# Patient Record
Sex: Female | Born: 1960 | Race: Black or African American | Hispanic: No | Marital: Married | Smoking: Never smoker
Health system: Southern US, Community
[De-identification: ages and names within clinical notes are randomized; demographics above are authoritative.]

## PROBLEM LIST (undated history)

## (undated) DIAGNOSIS — R112 Nausea with vomiting, unspecified: Secondary | ICD-10-CM

## (undated) DIAGNOSIS — C801 Malignant (primary) neoplasm, unspecified: Secondary | ICD-10-CM

## (undated) DIAGNOSIS — IMO0001 Reserved for inherently not codable concepts without codable children: Secondary | ICD-10-CM

## (undated) DIAGNOSIS — R519 Headache, unspecified: Secondary | ICD-10-CM

## (undated) DIAGNOSIS — K219 Gastro-esophageal reflux disease without esophagitis: Secondary | ICD-10-CM

## (undated) DIAGNOSIS — T753XXA Motion sickness, initial encounter: Secondary | ICD-10-CM

## (undated) DIAGNOSIS — T8859XA Other complications of anesthesia, initial encounter: Secondary | ICD-10-CM

## (undated) DIAGNOSIS — R200 Anesthesia of skin: Secondary | ICD-10-CM

## (undated) DIAGNOSIS — Z90711 Acquired absence of uterus with remaining cervical stump: Secondary | ICD-10-CM

## (undated) DIAGNOSIS — E669 Obesity, unspecified: Secondary | ICD-10-CM

## (undated) DIAGNOSIS — I10 Essential (primary) hypertension: Secondary | ICD-10-CM

## (undated) DIAGNOSIS — R202 Paresthesia of skin: Secondary | ICD-10-CM

## (undated) DIAGNOSIS — R5383 Other fatigue: Secondary | ICD-10-CM

## (undated) DIAGNOSIS — Z9889 Other specified postprocedural states: Secondary | ICD-10-CM

## (undated) DIAGNOSIS — R6889 Other general symptoms and signs: Secondary | ICD-10-CM

## (undated) DIAGNOSIS — Z9289 Personal history of other medical treatment: Secondary | ICD-10-CM

## (undated) DIAGNOSIS — R51 Headache: Secondary | ICD-10-CM

## (undated) DIAGNOSIS — T4145XA Adverse effect of unspecified anesthetic, initial encounter: Secondary | ICD-10-CM

## (undated) HISTORY — DX: Anesthesia of skin: R20.0

## (undated) HISTORY — DX: Headache, unspecified: R51.9

## (undated) HISTORY — PX: FOOT SURGERY: SHX648

## (undated) HISTORY — PX: OTHER SURGICAL HISTORY: SHX169

## (undated) HISTORY — DX: Reserved for inherently not codable concepts without codable children: IMO0001

## (undated) HISTORY — DX: Other fatigue: R53.83

## (undated) HISTORY — DX: Gastro-esophageal reflux disease without esophagitis: K21.9

## (undated) HISTORY — PX: CARPAL TUNNEL RELEASE: SHX101

## (undated) HISTORY — DX: Anesthesia of skin: R20.2

## (undated) HISTORY — PX: UPPER GASTROINTESTINAL ENDOSCOPY: SHX188

## (undated) HISTORY — DX: Essential (primary) hypertension: I10

## (undated) HISTORY — DX: Headache: R51

## (undated) HISTORY — DX: Other general symptoms and signs: R68.89

## (undated) HISTORY — DX: Personal history of other medical treatment: Z92.89

## (undated) HISTORY — DX: Obesity, unspecified: E66.9

## (undated) HISTORY — DX: Acquired absence of uterus with remaining cervical stump: Z90.711

---
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
## 1999-09-30 ENCOUNTER — Encounter: Payer: Self-pay | Admitting: Family Medicine

## 1999-09-30 ENCOUNTER — Ambulatory Visit (HOSPITAL_COMMUNITY): Admission: RE | Admit: 1999-09-30 | Discharge: 1999-09-30 | Payer: Self-pay | Admitting: Family Medicine

## 2000-12-17 ENCOUNTER — Emergency Department (HOSPITAL_COMMUNITY): Admission: EM | Admit: 2000-12-17 | Discharge: 2000-12-18 | Payer: Self-pay | Admitting: Emergency Medicine

## 2000-12-18 ENCOUNTER — Encounter: Payer: Self-pay | Admitting: Emergency Medicine

## 2001-07-12 ENCOUNTER — Other Ambulatory Visit: Admission: RE | Admit: 2001-07-12 | Discharge: 2001-07-12 | Payer: Self-pay | Admitting: Internal Medicine

## 2005-05-29 ENCOUNTER — Emergency Department (HOSPITAL_COMMUNITY): Admission: EM | Admit: 2005-05-29 | Discharge: 2005-05-30 | Payer: Self-pay | Admitting: Emergency Medicine

## 2005-10-13 HISTORY — PX: ABDOMINAL HYSTERECTOMY: SHX81

## 2006-06-16 ENCOUNTER — Ambulatory Visit (HOSPITAL_COMMUNITY): Admission: RE | Admit: 2006-06-16 | Discharge: 2006-06-16 | Payer: Self-pay | Admitting: Obstetrics

## 2006-06-25 ENCOUNTER — Encounter (INDEPENDENT_AMBULATORY_CARE_PROVIDER_SITE_OTHER): Payer: Self-pay | Admitting: Specialist

## 2006-06-25 ENCOUNTER — Inpatient Hospital Stay (HOSPITAL_COMMUNITY): Admission: RE | Admit: 2006-06-25 | Discharge: 2006-06-28 | Payer: Self-pay | Admitting: Obstetrics

## 2006-07-07 ENCOUNTER — Inpatient Hospital Stay (HOSPITAL_COMMUNITY): Admission: AD | Admit: 2006-07-07 | Discharge: 2006-07-07 | Payer: Self-pay | Admitting: Obstetrics

## 2007-08-17 ENCOUNTER — Inpatient Hospital Stay (HOSPITAL_COMMUNITY): Admission: AD | Admit: 2007-08-17 | Discharge: 2007-08-17 | Payer: Self-pay | Admitting: Obstetrics

## 2007-10-20 ENCOUNTER — Encounter: Admission: RE | Admit: 2007-10-20 | Discharge: 2007-12-01 | Payer: Self-pay | Admitting: Internal Medicine

## 2008-07-19 ENCOUNTER — Ambulatory Visit (HOSPITAL_COMMUNITY): Admission: RE | Admit: 2008-07-19 | Discharge: 2008-07-19 | Payer: Self-pay | Admitting: Obstetrics

## 2008-07-31 ENCOUNTER — Ambulatory Visit: Payer: Self-pay | Admitting: Internal Medicine

## 2008-07-31 DIAGNOSIS — I10 Essential (primary) hypertension: Secondary | ICD-10-CM | POA: Insufficient documentation

## 2008-07-31 DIAGNOSIS — R109 Unspecified abdominal pain: Secondary | ICD-10-CM | POA: Insufficient documentation

## 2008-07-31 LAB — CONVERTED CEMR LAB
ALT: 16 U/L
AST: 18 U/L
Albumin: 3.6 g/dL
Alkaline Phosphatase: 67 U/L
Amylase: 89 U/L
Basophils Absolute: 0 10*3/uL
Basophils Relative: 0.6 %
Bilirubin, Direct: 0.1 mg/dL
Eosinophils Absolute: 0.1 10*3/uL
Eosinophils Relative: 1.8 %
HCT: 37.7 %
Hemoglobin: 13.1 g/dL
Lymphocytes Relative: 34.8 %
MCHC: 34.7 g/dL
MCV: 83.4 fL
Monocytes Absolute: 0.3 10*3/uL
Monocytes Relative: 5.2 %
Neutro Abs: 3.3 10*3/uL
Neutrophils Relative %: 57.6 %
Platelets: 170 10*3/uL
RBC: 4.52 M/uL
RDW: 11.9 %
Total Bilirubin: 0.6 mg/dL
Total Protein: 7.4 g/dL
WBC: 5.7 10*3/uL

## 2008-08-02 ENCOUNTER — Ambulatory Visit (HOSPITAL_COMMUNITY): Admission: RE | Admit: 2008-08-02 | Discharge: 2008-08-02 | Payer: Self-pay | Admitting: Internal Medicine

## 2008-08-04 ENCOUNTER — Ambulatory Visit: Payer: Self-pay | Admitting: Internal Medicine

## 2008-08-15 ENCOUNTER — Ambulatory Visit: Payer: Self-pay | Admitting: Internal Medicine

## 2008-08-15 ENCOUNTER — Encounter: Payer: Self-pay | Admitting: Internal Medicine

## 2008-08-19 ENCOUNTER — Encounter: Payer: Self-pay | Admitting: Internal Medicine

## 2009-03-13 DIAGNOSIS — Z9289 Personal history of other medical treatment: Secondary | ICD-10-CM

## 2009-03-13 DIAGNOSIS — IMO0001 Reserved for inherently not codable concepts without codable children: Secondary | ICD-10-CM

## 2009-03-13 HISTORY — DX: Personal history of other medical treatment: Z92.89

## 2009-03-13 HISTORY — DX: Reserved for inherently not codable concepts without codable children: IMO0001

## 2009-03-16 ENCOUNTER — Encounter: Payer: Self-pay | Admitting: Cardiology

## 2009-03-22 ENCOUNTER — Ambulatory Visit: Payer: Self-pay | Admitting: Cardiology

## 2009-03-22 ENCOUNTER — Encounter: Payer: Self-pay | Admitting: Cardiology

## 2009-03-22 DIAGNOSIS — R072 Precordial pain: Secondary | ICD-10-CM | POA: Insufficient documentation

## 2009-03-22 DIAGNOSIS — I509 Heart failure, unspecified: Secondary | ICD-10-CM | POA: Insufficient documentation

## 2009-04-03 ENCOUNTER — Telehealth (INDEPENDENT_AMBULATORY_CARE_PROVIDER_SITE_OTHER): Payer: Self-pay

## 2009-04-04 ENCOUNTER — Ambulatory Visit: Payer: Self-pay

## 2009-04-04 ENCOUNTER — Ambulatory Visit: Payer: Self-pay | Admitting: Cardiology

## 2009-04-05 ENCOUNTER — Encounter: Payer: Self-pay | Admitting: Cardiology

## 2009-04-05 ENCOUNTER — Ambulatory Visit: Payer: Self-pay

## 2009-04-06 ENCOUNTER — Ambulatory Visit: Payer: Self-pay | Admitting: Cardiology

## 2009-04-06 ENCOUNTER — Encounter: Payer: Self-pay | Admitting: Cardiology

## 2009-05-16 ENCOUNTER — Encounter (INDEPENDENT_AMBULATORY_CARE_PROVIDER_SITE_OTHER): Payer: Self-pay | Admitting: *Deleted

## 2010-10-25 ENCOUNTER — Emergency Department: Payer: Self-pay | Admitting: Emergency Medicine

## 2010-11-10 LAB — CONVERTED CEMR LAB
CO2: 28 meq/L (ref 19–32)
Calcium: 9 mg/dL (ref 8.4–10.5)
Chloride: 106 meq/L (ref 96–112)
Creatinine, Ser: 0.9 mg/dL (ref 0.4–1.2)
GFR calc non Af Amer: 85.84 mL/min (ref >60)
Potassium: 3.7 meq/L (ref 3.5–5.1)

## 2011-02-28 NOTE — Discharge Summary (Signed)
NAME:  [name], [name]                ACCOUNT NO.:  [id number]   MEDICAL RECORD NO.:  [id number]          PATIENT TYPE:  INP   LOCATION:  9310                          FACILITY:  [facility]   PHYSICIAN:  Roseanna Rainbow, M.D.DATE OF BIRTH:  04/28/1961   DATE OF ADMISSION:  06/25/2006  DATE OF DISCHARGE:  06/28/2006                                 DISCHARGE SUMMARY   CHIEF COMPLAINT:  The patient is a 50 year old with symptomatic uterine  fibroids who presents for total abdominal hysterectomy.   HISTORY OF PRESENT ILLNESS:  She reports lower abdominal cramping.  A CT  scan was consistent with large fibroid uterus.   ALLERGIES:  No known drug allergies.   MEDICATIONS:  Norvasc.   PAST SURGICAL HISTORY:  Tubal ligation and oral surgery.   PAST MEDICAL HISTORY:  Hypertension.   FAMILY HISTORY:  Noncontributory   PHYSICAL EXAMINATION:  VITAL SIGNS: Temperature 97.8, pulse 83, respiratory  rate 18, blood pressure 143/93.  GENERAL APPEARANCE:  Well-developed, well-nourished, African-American female  in no apparent distress.  HEAD, EYES, EARS, NOSE AND THROAT:  Normocephalic, atraumatic.  NECK:  Supple.  LUNGS:  Clear to auscultation.  HEART: Regular rate and rhythm.  BREASTS: No masses, tenderness, or discharge.  ABDOMEN:  Obese.  Mass arising from the pelvis to approximately 4 cm below  the umbilicus, tender.  Adenopathy none.  PELVIC:  Normal external female genitalia.  Vagina clean.  Cervix without  lesions.  BIMANUAL:  Uterus approximately 16 weeks size, tender.  Adnexa nonpalpable.  EXTREMITIES:  No clubbing, cyanosis or edema.  SKIN: No rash.   ASSESSMENT:  Symptomatic uterine fibroid.   PLAN:  The planned procedure is a total abdominal hysterectomy.   HOSPITAL COURSE:  The patient was admitted and underwent a total abdominal  hysterectomy.  Please see the dictated operative summary.  On postoperative  day #1, her hemoglobin was 10.  The remainder of her hospital course  was  uneventful.  She was discharged to home on postoperative day #3 tolerating a  regular diet.   DISCHARGE DIAGNOSIS:  Uterine fibroids.   PROCEDURE:  Total abdominal hysterectomy.   CONDITION:  Good.   DIET:  Regular.   ACTIVITY:  Progressive activity, pelvic rest.   MEDICATIONS:  Resume home medications.  Percocet.   DISPOSITION:  The patient was to followup in the office in 1-2 weeks.      Roseanna Rainbow, M.D.  Electronically Signed     LAJ/MEDQ  D:  07/19/2006  T:  07/20/2006  Job:  [id number]

## 2011-02-28 NOTE — Op Note (Signed)
NAME:  [name], PALERMO                ACCOUNT NO.:  [id number]   MEDICAL RECORD NO.:  [id number]          PATIENT TYPE:  INP   LOCATION:  9310                          FACILITY:  [facility]   PHYSICIAN:  Charles A. Clearance Coots, M.D.DATE OF BIRTH:  Feb 19, 1961   DATE OF PROCEDURE:  06/25/2006  DATE OF DISCHARGE:                                 OPERATIVE REPORT   PREOPERATIVE DIAGNOSIS:  Symptomatic uterine fibroids.   POSTOPERATIVE DIAGNOSIS:  Symptomatic uterine fibroids.   PROCEDURE:  Total abdominal hysterectomy.   SURGEON:  Dr. Coral Ceo.   ASSISTANT:  Dr. Antionette Char.   ANESTHESIA:  General.   ESTIMATED BLOOD LOSS:  400 mL.   IV FLUIDS:  2300 mL.   URINE OUTPUT:  200 mL clear.   COMPLICATIONS:  None.   Foley to gravity.   SPECIMEN:  Uterus with cervix and fibroids   FINDINGS:  Multiple uterine fibroids.   DESCRIPTION OF PROCEDURE:  The patient was brought to the operating room and  after satisfactory general endotracheal anesthesia, the abdomen was prepped  and draped in the usual sterile fashion and an indwelling Foley catheter was  placed.  A Pfannenstiel skin incision was made with a scalpel that was deep  and down to the fascia with the scalpel. The fascia was nicked in the  midline and the fascial incision was extended to the left and to right with  curved Mayo scissors.  The superior and inferior fascial edges were taken  off of the rectus muscles with both blunt and sharp dissection.  The rectus  muscle was divided bluntly in the midline and the peritoneum was entered  digitally and was extended superiorly and inferiorly sharply with Mayo  scissors being careful to avoid the urinary bladder inferiorly. The Lenox Ahr retractor was placed and the bowel was packed off with moist lap  sponges. The bladder blade and the posterior blade was then placed and the  Kelly forceps were placed across the cornu of the uterus across the utero-  ovarian and broad  ligament.  The round ligaments were grasped with Kelly  forceps and were transected with electrocautery bilaterally and the incision  was extended anteriorly and posteriorly being carried anteriorly around the  vesicouterine fold of peritoneum and the urinary bladder was pushed down and  away from the lower uterine segment and cervix, placing it well out of the  operative field.  The peritoneum was then taken posteriorly towards the  uterosacral ligaments. The broad ligament was tented digitally in the medial  aspect to create an opening and the Kelly forceps that were placed on the  cornu were advanced down within this opening and parametrial clamp was  placed below the Kelly forceps and advanced into the opening that was  created in the broad ligament.  The utero-ovarian ligament and broad  ligament was then transected between the clamps and a free tie of #0 Vicryl  was placed beneath the parametrial clamp and a transfixion suture of #0  Vicryl was placed above the knot.  The same procedure was performed on the  opposite side  without complications.  The uterine vessels were then  skeletonized bilaterally and crossclamped with a parametrial clamp and a  straight parametrial clamp was placed above the parametrial clamp to prevent  backflow.  The uterine vessels were transected and suture ligated with a  transfixion suture of zero Vicryl bilaterally. Because of the large bulky  uterus, the uterine fundus was then transected above the knot where the  uterine vessels were ligated and the fundus was submitted to pathology for  evaluation. The cervical stump was then grasped with Kocher forceps  anteriorly and posteriorly and the procedure then continued with  crossclamping of the cardinal ligaments with straight parametrial clamps  transection and suture ligation with transfixion sutures of #0 Vicryl down  to the uterosacral ligaments bilaterally. The uterosacral ligaments were  then  crossclamped with curved parametrial clamps, transected and suture  ligated with transfixion sutures of #0 Vicryl bilaterally. Curved  parametrial clamps were then placed across the vaginal cuff on each side and  meeting in the center of the vaginal cuff. The cervix was then transected  submitted to pathology for evaluation. Each corner of the vaginal cuff was  suture ligated with transfixion sutures of #0 Vicryl and the center of the  vaginal cuff was suture ligated with figure-of-eight sutures of #0 Vicryl.  Hemostasis was excellent. The pelvic cavity was thoroughly irrigated with  warm saline solution and all clots were removed. All pedicles were observed  for hemostasis and there was no active bleeding noted. All instruments were  then retired and the O'Connor-O'Sullivan retractor was removed.  All packing  was removed. The surgical technician indicated that all sponge and  instrument counts were correct but the needle counts were not correct and a  x-ray of the abdomen was therefore ordered to be done after closure. The  abdomen was then closed as follows:  The peritoneum was closed with a  continuous suture of 2-0 Vicryl.  The fascia was closed with a continuous  suture of #0 PDS from each corner to the center.  The subcutaneous tissue  was thoroughly irrigated with warm saline solution.  All areas of  subcutaneous bleeding were coagulated with the Bovie. The skin was then  closed with a continuous subcuticular suture of 3-0 Monocryl.  A sterile  bandage was applied to the incision closure. X-ray of the abdomen was done  and revealed no evidence of retained needle in the abdomen. The needle count  actually was correct as it represented the number packs of needles that were  opened and the number of needles that were returned. The patient tolerated  the procedure well.  She was transported to the recovery room in  satisfactory condition.     Charles A. Clearance Coots, M.D.  Electronically  Signed     CAH/MEDQ  D:  06/25/2006  T:  06/26/2006  Job:  [id number]

## 2011-03-31 ENCOUNTER — Encounter: Payer: Self-pay | Admitting: Cardiovascular Disease

## 2011-04-15 ENCOUNTER — Other Ambulatory Visit: Payer: Self-pay | Admitting: Obstetrics & Gynecology

## 2011-04-15 DIAGNOSIS — Z1211 Encounter for screening for malignant neoplasm of colon: Secondary | ICD-10-CM

## 2011-05-06 ENCOUNTER — Other Ambulatory Visit: Payer: Self-pay

## 2011-07-22 LAB — URINE MICROSCOPIC-ADD ON

## 2011-12-14 ENCOUNTER — Other Ambulatory Visit: Payer: Self-pay | Admitting: Obstetrics

## 2012-09-08 ENCOUNTER — Encounter (HOSPITAL_COMMUNITY): Payer: Self-pay | Admitting: *Deleted

## 2012-09-08 ENCOUNTER — Emergency Department (HOSPITAL_COMMUNITY)
Admission: EM | Admit: 2012-09-08 | Discharge: 2012-09-09 | Disposition: A | Discharge status: Home / Self Care | Payer: BC Managed Care – PPO | Attending: Emergency Medicine | Admitting: Emergency Medicine

## 2012-09-08 DIAGNOSIS — R51 Headache: Secondary | ICD-10-CM | POA: Insufficient documentation

## 2012-09-08 DIAGNOSIS — R42 Dizziness and giddiness: Secondary | ICD-10-CM | POA: Insufficient documentation

## 2012-09-08 DIAGNOSIS — Z8719 Personal history of other diseases of the digestive system: Secondary | ICD-10-CM | POA: Insufficient documentation

## 2012-09-08 DIAGNOSIS — E669 Obesity, unspecified: Secondary | ICD-10-CM | POA: Insufficient documentation

## 2012-09-08 DIAGNOSIS — R110 Nausea: Secondary | ICD-10-CM | POA: Insufficient documentation

## 2012-09-08 MED ORDER — DIAZEPAM 5 MG/ML IJ SOLN
5.0000 mg | Freq: Once | INTRAMUSCULAR | Status: AC
  Administered 2012-09-08: 5 mg via INTRAVENOUS
  Filled 2012-09-08: qty 2

## 2012-09-08 MED ORDER — MECLIZINE HCL 25 MG PO TABS
25.0000 mg | ORAL_TABLET | Freq: Once | ORAL | Status: AC
  Administered 2012-09-08: 25 mg via ORAL
  Filled 2012-09-08: qty 1

## 2012-09-08 MED ORDER — ONDANSETRON HCL 4 MG/2ML IJ SOLN
4.0000 mg | Freq: Once | INTRAMUSCULAR | Status: AC
  Administered 2012-09-08: 4 mg via INTRAVENOUS
  Filled 2012-09-08: qty 2

## 2012-09-08 MED ORDER — DIAZEPAM 5 MG/ML IJ SOLN
2.5000 mg | Freq: Once | INTRAMUSCULAR | Status: AC
  Administered 2012-09-08: 2.5 mg via INTRAVENOUS
  Filled 2012-09-08: qty 2

## 2012-09-08 NOTE — ED Provider Notes (Signed)
History     CSN: [id number]  Arrival date & time 09/08/12  2151   First MD Initiated Contact with Patient 09/08/12 2200      Chief Complaint  Patient presents with  . Hypertension    (Consider location/radiation/quality/duration/timing/severity/associated sxs/prior treatment) Patient is a 51 y.o. female presenting with neurologic complaint. The history is provided by the patient.  Neurologic Problem The primary symptoms include headaches (mild), dizziness and nausea. Primary symptoms do not include fever or vomiting. The symptoms began less than 1 hour ago. The symptoms are unchanged. The neurological symptoms are diffuse. Context: while driving.  The headache is not associated with neck stiffness or weakness.  Dizziness also occurs with nausea. Dizziness does not occur with vomiting or weakness.  Additional symptoms do not include neck stiffness or weakness. Workup history does not include MRI or CT scan.    Past Medical History  Diagnosis Date  . HTN (hypertension)   . Obesity   . S/P partial hysterectomy   . GERD (gastroesophageal reflux disease)     w nonobstructing esophageal stricture  . Normal echocardiogram 6/10    EF 60-65% moderate diastolic dysfunction, mild LAE  . History of ETT 6/10    myoview EF 65%, normal wall motion, no ischemia or infarction, poor exercise capacity    Past Surgical History  Procedure Date  . Hysterectomy (other)     Family History  Problem Relation Age of Onset  . Heart attack Mother 64  . Heart attack Maternal Grandmother     age 20 or 42    History  Substance Use Topics  . Smoking status: Never Smoker   . Smokeless tobacco: Not on file  . Alcohol Use: No    OB History    Grav Para Term Preterm Abortions TAB SAB Ect Mult Living                  Review of Systems  Constitutional: Negative for fever and chills.  HENT: Negative for neck stiffness.   Respiratory: Negative for cough and shortness of breath.     Gastrointestinal: Positive for nausea. Negative for vomiting and abdominal pain.  Neurological: Positive for dizziness and headaches (mild). Negative for weakness.  All other systems reviewed and are negative.    Allergies  Review of patient's allergies indicates no known allergies.  Home Medications   Current Outpatient Rx  Name  Route  Sig  Dispense  Refill  . ASPIRIN 81 MG PO TBEC   Oral   Take 81 mg by mouth daily.           Marland Kitchen VITAMIN D PO   Oral   Take 1 tablet by mouth daily.         Marland Kitchen LISINOPRIL-HYDROCHLOROTHIAZIDE 20-12.5 MG PO TABS   Oral   Take 2 tablets by mouth daily.             BP 142/86  Pulse 80  Resp 18  SpO2 97%  Physical Exam  Nursing note and vitals reviewed. Constitutional: She is oriented to person, place, and time. She appears well-developed and well-nourished. No distress.  HENT:  Head: Normocephalic and atraumatic.  Eyes: EOM are normal. Pupils are equal, round, and reactive to light.  Neck: Normal range of motion. Neck supple.  Cardiovascular: Normal rate and regular rhythm.  Exam reveals no friction rub.   No murmur heard. Pulmonary/Chest: Effort normal and breath sounds normal. No respiratory distress. She has no wheezes. She has no rales.  Abdominal: Soft. She exhibits no distension. There is no tenderness. There is no rebound.  Musculoskeletal: Normal range of motion. She exhibits no edema.  Neurological: She is alert and oriented to person, place, and time.  Skin: She is not diaphoretic.    ED Course  Procedures (including critical care time)  Labs Reviewed - No data to display Ct Head Wo Contrast  09/09/2012  *RADIOLOGY REPORT*  Clinical Data: Headache.  Dizziness.  CT HEAD WITHOUT CONTRAST  Technique:  Contiguous axial images were obtained from the base of the skull through the vertex without contrast.  Comparison: No priors.  Findings: There is mild decreased attenuation in the periventricular white matter of the cerebral  hemispheres bilaterally, suggestive of very mild chronic microvascular ischemic disease.  No acute intracranial abnormalities.  Specifically, no signs of acute intracranial hemorrhage, definite area of acute/subacute cerebral ischemia, mass, mass effect, hydrocephalus or abnormal intra or extra-axial fluid collections.  Visualized paranasal sinuses and mastoids are well pneumatized.  No acute displaced skull fractures are identified.  IMPRESSION: 1.  No acute intracranial abnormalities. 2.  Findings suggestive of very mild chronic microvascular ischemic changes in the periventricular white matter, as above.  The appearance of the brain is otherwise normal.   Original Report Authenticated By: Trudie Reed, M.D.      1. Vertigo       MDM   A 51 year old female presents with sudden onset dizziness. Began while she was driving. Dizziness described as a spinning sensation with associated nausea. Initially hypertensive with EMS, however on arrival here initial pressure in the 130s.  Patient continually describing a sensation of spinning in the room. A Dix-Hallpike was grossly positive for serious exacerbation of her symptoms. Patient also experienced severe symptoms during head impulse testing. Due to her sudden onset of symptoms and severe nature, I do feel that she is experiencing peripheral vertigo. I do not feel she has central vertigo. She had no nystagmus present on exam, no fatigable nystagmus, and no field deficits. The remainder of her neurologic exam was normal. I did not walk her due to her severe symptoms. Patient given meclizine, Benadryl, Valium. Patient also had a CT of her head since she's never had these symptoms before and has history of hypertension.  CT Head normal. States mild improvement with the medicine, however still having the dizziness. Will give the patient some time to rest to see if she can ambulate.     Elwin Mocha, MD 09/09/12 [phone]

## 2012-09-08 NOTE — ED Notes (Signed)
Pt in from home via [facility], pt c/o dizziness onset while driving to picking up HTN meds, pt denies CP, SOB, V/D, blurred vision, pt c/o nausea, pt A&O x4, follows commands, speaks in complete sentences, NAD

## 2012-09-09 ENCOUNTER — Emergency Department (HOSPITAL_COMMUNITY): Payer: BC Managed Care – PPO

## 2012-09-09 ENCOUNTER — Encounter (HOSPITAL_COMMUNITY): Payer: Self-pay | Admitting: Radiology

## 2012-09-09 MED ORDER — ONDANSETRON HCL 8 MG PO TABS: 8.0000 mg | ORAL_TABLET | Freq: Three times a day (TID) | ORAL | Status: DC | PRN

## 2012-09-09 MED ORDER — MECLIZINE HCL 25 MG PO TABS: 25.0000 mg | ORAL_TABLET | Freq: Three times a day (TID) | ORAL | Status: DC | PRN

## 2012-09-09 NOTE — ED Provider Notes (Signed)
I saw and evaluated the patient, reviewed the resident's note and I agree with the findings and plan.  Sudden onset of vertigo while driving with nausea.  CN 2-12 intact, no ataxia on finger to nose, 5/5 strength throughout. No nystagmus.  Test of skew negative. Head impulse testing negative. Suspect peripheral vertigo given severity of symptoms and sudden onset.    Glynn Octave, MD 09/09/12 [phone]

## 2012-09-09 NOTE — ED Notes (Signed)
Patient back from CT; currently sitting up un bed; no respiratory or acute distress noted.  Patient updated on plan of care; informed patient that we are currently waiting on further orders/disposition from EDP.  Patient denies any other needs at this time; will continue to monitor.

## 2013-01-30 ENCOUNTER — Emergency Department: Payer: Self-pay | Admitting: Emergency Medicine

## 2013-08-02 ENCOUNTER — Encounter: Payer: Self-pay | Admitting: Obstetrics

## 2013-12-12 ENCOUNTER — Other Ambulatory Visit: Payer: Self-pay | Admitting: Obstetrics

## 2013-12-14 ENCOUNTER — Encounter: Payer: Self-pay | Admitting: Obstetrics

## 2014-02-17 ENCOUNTER — Ambulatory Visit (INDEPENDENT_AMBULATORY_CARE_PROVIDER_SITE_OTHER): Payer: BC Managed Care – PPO | Admitting: Podiatry

## 2014-02-17 ENCOUNTER — Encounter: Payer: Self-pay | Admitting: Podiatry

## 2014-02-17 ENCOUNTER — Telehealth: Payer: Self-pay | Admitting: *Deleted

## 2014-02-17 VITALS — BP 129/89 | HR 83 | Resp 16

## 2014-02-17 DIAGNOSIS — L03039 Cellulitis of unspecified toe: Secondary | ICD-10-CM

## 2014-02-17 NOTE — Telephone Encounter (Signed)
I have a toe that is sore and bleeding.  I know he's not in today.  I don't know what to do, whether to see somebody there or go to emergency room.  Please call.  I returned her call.  She thanked me for returning her call.  She stated she made an appointment in the [place] office with Dr. Paulla Dolly for today.  She stated that she wanted to report that whomever she spoke to that answers the phone was very abrupt.  She said she was very offended and thinks that person needs to be talked to about their phone etiquette.  It was very unprofessional.  I informed her I would let the office manager know.

## 2014-02-17 NOTE — Patient Instructions (Signed)

## 2014-02-19 NOTE — Progress Notes (Signed)
Subjective:     Patient ID: [name], female   DOB: 10-May-1961, 53 y.o.   MRN: [id number]  HPI patient presents with a damage left second nail to trauma that just occurred over the last few days and she can not wear shoe comfortable   Review of Systems     Objective:   Physical Exam Neurovascular status intact with patient well oriented x3 and found to have a damage second nail left which is loose and difficult for her to work with    Assessment:     Paronychia left second digit and nail secondary to trauma with distention of tissue plantar to the nail bed    Plan:     H&P performed and infiltrated 60 mg like Marcaine mixture to the left second toe. Removed the nail proud flesh abscess tissue which I found to be localized with no acute drainage noted and applied sterile dressing. Reappoint to recheck and begin soaks

## 2014-02-21 ENCOUNTER — Ambulatory Visit (INDEPENDENT_AMBULATORY_CARE_PROVIDER_SITE_OTHER): Payer: BC Managed Care – PPO | Admitting: Obstetrics

## 2014-02-21 ENCOUNTER — Encounter: Payer: Self-pay | Admitting: Obstetrics

## 2014-02-21 DIAGNOSIS — N393 Stress incontinence (female) (male): Secondary | ICD-10-CM | POA: Insufficient documentation

## 2014-02-21 DIAGNOSIS — N951 Menopausal and female climacteric states: Secondary | ICD-10-CM

## 2014-02-21 DIAGNOSIS — Z01419 Encounter for gynecological examination (general) (routine) without abnormal findings: Secondary | ICD-10-CM

## 2014-02-21 DIAGNOSIS — L0293 Carbuncle, unspecified: Secondary | ICD-10-CM | POA: Insufficient documentation

## 2014-02-21 DIAGNOSIS — M549 Dorsalgia, unspecified: Secondary | ICD-10-CM | POA: Insufficient documentation

## 2014-02-21 LAB — CBC WITH DIFFERENTIAL/PLATELET
Basophils Absolute: 0 10*3/uL (ref 0.0–0.1)
Basophils Relative: 0 % (ref 0–1)
Eosinophils Absolute: 0.1 10*3/uL (ref 0.0–0.7)
Eosinophils Relative: 2 % (ref 0–5)
HEMATOCRIT: 37 % (ref 36.0–46.0)
Hemoglobin: 12.2 g/dL (ref 12.0–15.0)
LYMPHS PCT: 36 % (ref 12–46)
Lymphs Abs: 2.2 10*3/uL (ref 0.7–4.0)
MCH: 27.3 pg (ref 26.0–34.0)
MCHC: 33 g/dL (ref 30.0–36.0)
MCV: 82.8 fL (ref 78.0–100.0)
MONO ABS: 0.2 10*3/uL (ref 0.1–1.0)
MONOS PCT: 4 % (ref 3–12)
NEUTROS ABS: 3.6 10*3/uL (ref 1.7–7.7)
Neutrophils Relative %: 58 % (ref 43–77)
Platelets: 193 10*3/uL (ref 150–400)
RBC: 4.47 MIL/uL (ref 3.87–5.11)
RDW: 13.6 % (ref 11.5–15.5)
WBC: 6.2 10*3/uL (ref 4.0–10.5)

## 2014-02-21 LAB — COMPREHENSIVE METABOLIC PANEL
ALT: 16 U/L (ref 0–35)
AST: 21 U/L (ref 0–37)
Albumin: 4.2 g/dL (ref 3.5–5.2)
Alkaline Phosphatase: 67 U/L (ref 39–117)
BUN: 17 mg/dL (ref 6–23)
CALCIUM: 9.7 mg/dL (ref 8.4–10.5)
CHLORIDE: 105 meq/L (ref 96–112)
CO2: 25 mEq/L (ref 19–32)
Creat: 0.84 mg/dL (ref 0.50–1.10)
GLUCOSE: 78 mg/dL (ref 70–99)
POTASSIUM: 4.5 meq/L (ref 3.5–5.3)
Sodium: 141 mEq/L (ref 135–145)
Total Bilirubin: 0.4 mg/dL (ref 0.2–1.2)
Total Protein: 6.7 g/dL (ref 6.0–8.3)

## 2014-02-21 MED ORDER — CYCLOBENZAPRINE HCL 10 MG PO TABS: 10.0000 mg | ORAL_TABLET | Freq: Three times a day (TID) | ORAL | Status: DC | PRN

## 2014-02-21 MED ORDER — DOXYCYCLINE HYCLATE 100 MG PO CAPS: 100.0000 mg | ORAL_CAPSULE | Freq: Two times a day (BID) | ORAL | Status: DC

## 2014-02-21 MED ORDER — METHOCARBAMOL 500 MG PO TABS
500.0000 mg | ORAL_TABLET | Freq: Three times a day (TID) | ORAL | Status: DC | PRN
Start: 2014-02-21 — End: 2015-01-12

## 2014-02-21 NOTE — Progress Notes (Signed)
Subjective:     [name] is a 53 y.o. female here for a routine exam.  Current complaints: Leaking urine.  Backache..    Personal health questionnaire:  Is patient [name], have a family history of breast and/or ovarian cancer: no Is there a family history of uterine cancer diagnosed at age < 39, gastrointestinal cancer, urinary tract cancer, family member who is a Field seismologist syndrome-associated carrier: no Is the patient overweight and hypertensive, family history of diabetes, personal history of gestational diabetes or PCOS: no Is patient over 89, have PCOS,  family history of premature CHD under age 33, diabetes, smoke, have hypertension or peripheral artery disease:  no  The HPI was reviewed and explored in further detail by the provider. Gynecologic History No LMP recorded. Patient has had a hysterectomy. Contraception: status post hysterectomy Last Pap: 2013. Results were: normal Last mammogram: 2014. Results were: normal  Obstetric History OB History  Gravida Para Term Preterm AB SAB TAB Ectopic Multiple Living  2 2 2       2     # Outcome Date GA Lbr Len/2nd Weight Sex Delivery Anes PTL Lv  2 TRM 04/24/85        Y  1 TRM 12/08/82        Y      Past Medical History  Diagnosis Date  . HTN (hypertension)   . Obesity   . S/P partial hysterectomy   . GERD (gastroesophageal reflux disease)     w nonobstructing esophageal stricture  . Normal echocardiogram 6/10    EF 70-62% moderate diastolic dysfunction, mild LAE  . History of ETT 6/10    myoview EF 65%, normal wall motion, no ischemia or infarction, poor exercise capacity    Past Surgical History  Procedure Laterality Date  . Hysterectomy (other)    . Foot surgery      Current outpatient prescriptions:aspirin (ASPIR-81) 81 MG EC tablet, Take 81 mg by mouth daily.  , Disp: , Rfl: ;  carvedilol (COREG) 6.25 MG tablet, TAKE 1 TABLET BY MOUTH TWICE DAILY, Disp: 180 tablet, Rfl: 2;  lisinopril-hydrochlorothiazide  (PRINZIDE,ZESTORETIC) 20-12.5 MG per tablet, TAKE 2 TABLETS BY MOUTH DAILY, Disp: 180 tablet, Rfl: 2;  Cholecalciferol (VITAMIN D PO), Take 1 tablet by mouth daily., Disp: , Rfl:  cyclobenzaprine (FLEXERIL) 10 MG tablet, Take 1 tablet (10 mg total) by mouth every 8 (eight) hours as needed for muscle spasms., Disp: 30 tablet, Rfl: 1;  doxycycline (VIBRAMYCIN) 100 MG capsule, Take 1 capsule (100 mg total) by mouth 2 (two) times daily., Disp: 14 capsule, Rfl: 2;  methocarbamol (ROBAXIN) 500 MG tablet, Take 1 tablet (500 mg total) by mouth every 8 (eight) hours as needed for muscle spasms., Disp: 30 tablet, Rfl: 1 No Known Allergies  History  Substance Use Topics  . Smoking status: Never Smoker   . Smokeless tobacco: Not on file  . Alcohol Use: No    Family History  Problem Relation Age of Onset  . Heart attack Mother 66  . Heart disease Mother   . Heart attack Maternal Grandmother     age 97 or 72  . Heart disease Maternal Grandmother       Review of Systems  Constitutional: negative for fatigue and weight loss Respiratory: negative for cough and wheezing Cardiovascular: negative for chest pain, fatigue and palpitations Gastrointestinal: negative for abdominal pain and change in bowel habits Musculoskeletal:negative for myalgias Neurological: negative for gait problems and tremors Behavioral/Psych: negative for abusive relationship,  depression Endocrine: negative for temperature intolerance   Genitourinary:negative for abnormal menstrual periods, genital lesions, hot flashes, sexual problems and vaginal discharge Integument/breast: negative for breast lump, breast tenderness, nipple discharge and skin lesion(s)    Objective:       There were no vitals taken for this visit. General:   alert  Skin:   no rash or abnormalities  Lungs:   clear to auscultation bilaterally  Heart:   regular rate and rhythm, S1, S2 normal, no murmur, click, rub or gallop  Breasts:   normal without  suspicious masses, skin or nipple changes or axillary nodes  Abdomen:  normal findings: no organomegaly, soft, non-tender and no hernia  Pelvis:  External genitalia: normal general appearance Urinary system: urethral meatus normal and bladder without fullness, nontender Vaginal: normal without tenderness, induration or masses Cervix: normal appearance Adnexa: normal bimanual exam Uterus: anteverted and non-tender, normal size   Lab Review Urine pregnancy test Labs reviewed yes Radiologic studies reviewed yes    Assessment:    Healthy female exam.  SUI Backache Vasomotor menopausal changes   Plan:    Follow up in: 1 year. Referred to Urology for SUI.  Referred to Orthopedics for Backache Bone Density Study ordered for possible menopausal changes.  Meds ordered this encounter  Medications  . doxycycline (VIBRAMYCIN) 100 MG capsule    Sig: Take 1 capsule (100 mg total) by mouth 2 (two) times daily.    Dispense:  14 capsule    Refill:  2  . cyclobenzaprine (FLEXERIL) 10 MG tablet    Sig: Take 1 tablet (10 mg total) by mouth every 8 (eight) hours as needed for muscle spasms.    Dispense:  30 tablet    Refill:  1  . methocarbamol (ROBAXIN) 500 MG tablet    Sig: Take 1 tablet (500 mg total) by mouth every 8 (eight) hours as needed for muscle spasms.    Dispense:  30 tablet    Refill:  1   Orders Placed This Encounter  Procedures  . WET PREP BY MOLECULAR PROBE  . DG Bone Density    Standing Status: Future     Number of Occurrences:      Standing Expiration Date: 04/24/2015    Order Specific Question:  Reason for Exam (SYMPTOM  OR DIAGNOSIS REQUIRED)    Answer:  Menopausal changes.    Order Specific Question:  Is the patient pregnant?    Answer:  No    Order Specific Question:  Preferred imaging location?    Answer:  [facility]  . HIV antibody  . Hepatitis B surface antigen  . RPR  . Hepatitis C antibody  . CBC with Differential  . Comprehensive metabolic  panel  . TSH  . Ambulatory referral to Urology    Referral Priority:  Routine    Referral Type:  Consultation    Referral Reason:  Specialty Services Required    Requested Specialty:  Urology    Number of Visits Requested:  1  . Ambulatory referral to Orthopedic Surgery    Referral Priority:  Routine    Referral Type:  Surgical    Referral Reason:  Specialty Services Required    Requested Specialty:  Orthopedic Surgery    Number of Visits Requested:  1

## 2014-02-22 ENCOUNTER — Encounter: Payer: Self-pay | Admitting: Obstetrics

## 2014-02-22 LAB — PAP IG (IMAGE GUIDED)

## 2014-02-22 LAB — TSH: TSH: 1.446 u[IU]/mL (ref 0.350–4.500)

## 2014-02-22 LAB — HEPATITIS B SURFACE ANTIGEN: Hepatitis B Surface Ag: NEGATIVE

## 2014-02-22 LAB — WET PREP BY MOLECULAR PROBE
Candida species: NEGATIVE
GARDNERELLA VAGINALIS: NEGATIVE
Trichomonas vaginosis: NEGATIVE

## 2014-02-22 LAB — RPR

## 2014-02-22 LAB — HIV ANTIBODY (ROUTINE TESTING W REFLEX): HIV 1&2 Ab, 4th Generation: NONREACTIVE

## 2014-02-22 LAB — HEPATITIS C ANTIBODY: HCV AB: NEGATIVE

## 2014-04-10 ENCOUNTER — Telehealth: Payer: Self-pay | Admitting: *Deleted

## 2014-04-10 NOTE — Telephone Encounter (Signed)
Patient called with a personal medical question and would like Dr Jodi Mourning to call her.

## 2014-07-06 ENCOUNTER — Emergency Department: Payer: Self-pay | Admitting: Emergency Medicine

## 2014-07-06 LAB — BASIC METABOLIC PANEL
Anion Gap: 8 (ref 7–16)
BUN: 12 mg/dL (ref 7–18)
CALCIUM: 8.6 mg/dL (ref 8.5–10.1)
Chloride: 103 mmol/L (ref 98–107)
Co2: 25 mmol/L (ref 21–32)
Creatinine: 0.97 mg/dL (ref 0.60–1.30)
EGFR (Non-African Amer.): >60
GLUCOSE: 138 mg/dL — AB (ref 65–99)
OSMOLALITY: 274 (ref 275–301)
Potassium: 3.3 mmol/L — ABNORMAL LOW (ref 3.5–5.1)
Sodium: 136 mmol/L (ref 136–145)

## 2014-07-06 LAB — CBC
HCT: 38 % (ref 35.0–47.0)
HGB: 12.2 g/dL (ref 12.0–16.0)
MCH: 26.8 pg (ref 26.0–34.0)
MCHC: 32.1 g/dL (ref 32.0–36.0)
MCV: 84 fL (ref 80–100)
Platelet: 165 10*3/uL (ref 150–440)
RBC: 4.56 10*6/uL (ref 3.80–5.20)
RDW: 13.1 % (ref 11.5–14.5)
WBC: 9.9 10*3/uL (ref 3.6–11.0)

## 2014-07-06 LAB — HEPATIC FUNCTION PANEL A (ARMC)
ALK PHOS: 72 U/L
ALT: 23 U/L
AST: 21 U/L (ref 15–37)
Albumin: 3.6 g/dL (ref 3.4–5.0)
Bilirubin, Direct: 0.1 mg/dL (ref 0.00–0.20)
Bilirubin,Total: 0.5 mg/dL (ref 0.2–1.0)
Total Protein: 7.6 g/dL (ref 6.4–8.2)

## 2014-07-06 LAB — TROPONIN I

## 2014-07-07 LAB — URINALYSIS, COMPLETE
BILIRUBIN, UR: NEGATIVE
BLOOD: NEGATIVE
Glucose,UR: NEGATIVE mg/dL (ref 0–75)
Ketone: NEGATIVE
LEUKOCYTE ESTERASE: NEGATIVE
NITRITE: NEGATIVE
PROTEIN: NEGATIVE
Ph: 7 (ref 4.5–8.0)
RBC,UR: 1 /HPF (ref 0–5)
Specific Gravity: 1.011 (ref 1.003–1.030)
Squamous Epithelial: <1
WBC UR: 1 /HPF (ref 0–5)

## 2014-07-07 LAB — TROPONIN I

## 2014-08-14 ENCOUNTER — Encounter: Payer: Self-pay | Admitting: Obstetrics

## 2014-10-13 HISTORY — PX: FOOT SURGERY: SHX648

## 2015-01-11 ENCOUNTER — Emergency Department (HOSPITAL_COMMUNITY)
Admission: EM | Admit: 2015-01-11 | Discharge: 2015-01-12 | Disposition: A | Discharge status: Home / Self Care | Payer: No Typology Code available for payment source | Attending: Emergency Medicine | Admitting: Emergency Medicine

## 2015-01-11 ENCOUNTER — Emergency Department (HOSPITAL_COMMUNITY): Payer: No Typology Code available for payment source

## 2015-01-11 ENCOUNTER — Encounter (HOSPITAL_COMMUNITY): Payer: Self-pay

## 2015-01-11 DIAGNOSIS — Y939 Activity, unspecified: Secondary | ICD-10-CM | POA: Insufficient documentation

## 2015-01-11 DIAGNOSIS — M542 Cervicalgia: Secondary | ICD-10-CM | POA: Diagnosis not present

## 2015-01-11 DIAGNOSIS — M549 Dorsalgia, unspecified: Secondary | ICD-10-CM

## 2015-01-11 DIAGNOSIS — S199XXA Unspecified injury of neck, initial encounter: Secondary | ICD-10-CM | POA: Insufficient documentation

## 2015-01-11 DIAGNOSIS — Y92410 Unspecified street and highway as the place of occurrence of the external cause: Secondary | ICD-10-CM | POA: Diagnosis not present

## 2015-01-11 DIAGNOSIS — Y999 Unspecified external cause status: Secondary | ICD-10-CM | POA: Diagnosis not present

## 2015-01-11 DIAGNOSIS — R29898 Other symptoms and signs involving the musculoskeletal system: Secondary | ICD-10-CM

## 2015-01-11 MED ORDER — METOCLOPRAMIDE HCL 5 MG/ML IJ SOLN
10.0000 mg | Freq: Once | INTRAMUSCULAR | Status: DC
  Filled 2015-01-11: qty 2

## 2015-01-11 MED ORDER — NAPROXEN 500 MG PO TABS
500.0000 mg | ORAL_TABLET | Freq: Once | ORAL | Status: AC
  Administered 2015-01-11: 500 mg via ORAL
  Filled 2015-01-11: qty 1

## 2015-01-11 MED ORDER — HYDROMORPHONE HCL 1 MG/ML IJ SOLN
0.5000 mg | Freq: Once | INTRAMUSCULAR | Status: DC
  Filled 2015-01-11: qty 1

## 2015-01-11 MED ORDER — ONDANSETRON 8 MG PO TBDP
8.0000 mg | ORAL_TABLET | Freq: Once | ORAL | Status: AC
  Administered 2015-01-11: 8 mg via ORAL
  Filled 2015-01-11: qty 1

## 2015-01-11 NOTE — ED Notes (Signed)
Pt still waiting on transport from EMS to [facility]
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
## 2015-01-11 NOTE — ED Notes (Signed)
Pt wanted to hold off on the medication for now

## 2015-01-11 NOTE — ED Notes (Signed)
Report given to Robin at [facility] ED, [facility] unavailable for transport, [facility] called for transport
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
## 2015-01-12 ENCOUNTER — Emergency Department (HOSPITAL_COMMUNITY): Payer: No Typology Code available for payment source

## 2015-01-12 DIAGNOSIS — S199XXA Unspecified injury of neck, initial encounter: Secondary | ICD-10-CM | POA: Diagnosis not present

## 2015-01-12 MED ORDER — METHOCARBAMOL 500 MG PO TABS: 1000.0000 mg | ORAL_TABLET | Freq: Three times a day (TID) | ORAL | Status: DC | PRN

## 2015-01-12 MED ORDER — HYDROCODONE-ACETAMINOPHEN 5-325 MG PO TABS: 1.0000 | ORAL_TABLET | ORAL | Status: DC | PRN

## 2015-01-12 MED ORDER — IBUPROFEN 600 MG PO TABS: 600.0000 mg | ORAL_TABLET | Freq: Four times a day (QID) | ORAL | Status: DC | PRN

## 2015-01-12 NOTE — ED Notes (Signed)
Pt. Left with all belongings 

## 2015-01-12 NOTE — ED Notes (Signed)
[facility] called for report 

## 2015-01-12 NOTE — ED Notes (Signed)
Called EMS to find out ETA, they say it will still be awhile

## 2015-01-12 NOTE — ED Provider Notes (Signed)
MRI without any traumatic findings. Mild disc bulge at the level of C5-C7. Patient has midline cervical tenderness at this level. She continues to have mild decrease in right grip strength. Complains of paresthesias to the palmar surface of the right hand. Discuss results of MRI and patient's physical exam with Dr.Nundkumar. He recommends discharge home with pain control and follow-up in his office in 2-3 days. He states that the cervical collar can be removed. Discussed with instructions with the patient and she agrees with the plan. She's been advised to return immediately for worsening pain, new numbness, weakness or for any concerns.  Tammy Rice, MD 01/12/15 [phone]

## 2015-01-12 NOTE — Discharge Instructions (Signed)
Take medication as prescribed. Call Dr.Nundkumar's office this morning to arrange follow-up appointment in 2-3 days. Return immediately for worsening numbness, weakness or for any concerns.  Cervical Sprain A cervical sprain is an injury in the neck in which the strong, fibrous tissues (ligaments) that connect your neck bones stretch or tear. Cervical sprains can range from mild to severe. Severe cervical sprains can cause the neck vertebrae to be unstable. This can lead to damage of the spinal cord and can result in serious nervous system problems. The amount of time it takes for a cervical sprain to get better depends on the cause and extent of the injury. Most cervical sprains heal in 1 to 3 weeks. CAUSES  Severe cervical sprains may be caused by:   Contact sport injuries (such as from football, rugby, wrestling, hockey, auto racing, gymnastics, diving, martial arts, or boxing).   Motor vehicle collisions.   Whiplash injuries. This is an injury from a sudden forward and backward whipping movement of the head and neck.  Falls.  Mild cervical sprains may be caused by:   Being in an awkward position, such as while cradling a telephone between your ear and shoulder.   Sitting in a chair that does not offer proper support.   Working at a poorly Landscape [employer] station.   Looking up or down for long periods of time.  SYMPTOMS   Pain, soreness, stiffness, or a burning sensation in the front, back, or sides of the neck. This discomfort may develop immediately after the injury or slowly, 24 hours or more after the injury.   Pain or tenderness directly in the middle of the back of the neck.   Shoulder or upper back pain.   Limited ability to move the neck.   Headache.   Dizziness.   Weakness, numbness, or tingling in the hands or arms.   Muscle spasms.   Difficulty swallowing or chewing.   Tenderness and swelling of the neck.  DIAGNOSIS  Most of the time your  health care provider can diagnose a cervical sprain by taking your history and doing a physical exam. Your health care provider will ask about previous neck injuries and any known neck problems, such as arthritis in the neck. X-rays may be taken to find out if there are any other problems, such as with the bones of the neck. Other tests, such as a CT scan or MRI, may also be needed.  TREATMENT  Treatment depends on the severity of the cervical sprain. Mild sprains can be treated with rest, keeping the neck in place (immobilization), and pain medicines. Severe cervical sprains are immediately immobilized. Further treatment is done to help with pain, muscle spasms, and other symptoms and may include:  Medicines, such as pain relievers, numbing medicines, or muscle relaxants.   Physical therapy. This may involve stretching exercises, strengthening exercises, and posture training. Exercises and improved posture can help stabilize the neck, strengthen muscles, and help stop symptoms from returning.  HOME CARE INSTRUCTIONS   Put ice on the injured area.   Put ice in a plastic bag.   Place a towel between your skin and the bag.   Leave the ice on for 15-20 minutes, 3-4 times a day.   If your injury was severe, you may have been given a cervical collar to wear. A cervical collar is a two-piece collar designed to keep your neck from moving while it heals.  Do not remove the collar unless instructed by your health care  provider.  If you have long hair, keep it outside of the collar.  Ask your health care provider before making any adjustments to your collar. Minor adjustments may be required over time to improve comfort and reduce pressure on your chin or on the back of your head.  Ifyou are allowed to remove the collar for cleaning or bathing, follow your health care provider's instructions on how to do so safely.  Keep your collar clean by wiping it with mild soap and water and drying it  completely. If the collar you have been given includes removable pads, remove them every 1-2 days and hand wash them with soap and water. Allow them to air dry. They should be completely dry before you wear them in the collar.  If you are allowed to remove the collar for cleaning and bathing, wash and dry the skin of your neck. Check your skin for irritation or sores. If you see any, tell your health care provider.  Do not drive while wearing the collar.   Only take over-the-counter or prescription medicines for pain, discomfort, or fever as directed by your health care provider.   Keep all follow-up appointments as directed by your health care provider.   Keep all physical therapy appointments as directed by your health care provider.   Make any needed adjustments to your workstation to promote good posture.   Avoid positions and activities that make your symptoms worse.   Warm up and stretch before being active to help prevent problems.  SEEK MEDICAL CARE IF:   Your pain is not controlled with medicine.   You are unable to decrease your pain medicine over time as planned.   Your activity level is not improving as expected.  SEEK IMMEDIATE MEDICAL CARE IF:   You develop any bleeding.  You develop stomach upset.  You have signs of an allergic reaction to your medicine.   Your symptoms get worse.   You develop new, unexplained symptoms.   You have numbness, tingling, weakness, or paralysis in any part of your body.  MAKE SURE YOU:   Understand these instructions.  Will watch your condition.  Will get help right away if you are not doing well or get worse. Document Released: 07/27/2007 Document Revised: 10/04/2013 Document Reviewed: 04/06/2013 [facility] Patient Information 2015 [place], [place]. This information is not intended to replace advice given to you by your health care provider. Make sure you discuss any questions you have with your health care  provider.

## 2015-01-12 NOTE — ED Notes (Signed)
[facility] here for transport 

## 2015-01-12 NOTE — ED Notes (Signed)
[facility] is now coming for the patient
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
## 2015-01-12 NOTE — ED Notes (Signed)
Returned the dilaudid to the pyxis after patient was discharged, had to inventory count the pyxis to do so

## 2015-01-16 ENCOUNTER — Encounter (HOSPITAL_BASED_OUTPATIENT_CLINIC_OR_DEPARTMENT_OTHER): Payer: Self-pay | Admitting: Emergency Medicine

## 2015-02-05 ENCOUNTER — Other Ambulatory Visit: Payer: Self-pay | Admitting: Otolaryngology

## 2015-02-05 DIAGNOSIS — E041 Nontoxic single thyroid nodule: Secondary | ICD-10-CM

## 2015-02-08 ENCOUNTER — Other Ambulatory Visit: Payer: Self-pay

## 2015-02-15 ENCOUNTER — Ambulatory Visit
Admission: RE | Admit: 2015-02-15 | Discharge: 2015-02-15 | Disposition: A | Discharge status: Home / Self Care | Payer: Self-pay | Source: Ambulatory Visit | Attending: Otolaryngology | Admitting: Otolaryngology

## 2015-02-15 ENCOUNTER — Other Ambulatory Visit (HOSPITAL_COMMUNITY)
Admission: RE | Admit: 2015-02-15 | Discharge: 2015-02-15 | Disposition: A | Discharge status: Home / Self Care | Payer: BC Managed Care – PPO | Source: Ambulatory Visit | Attending: Interventional Radiology | Admitting: Interventional Radiology

## 2015-02-15 DIAGNOSIS — E041 Nontoxic single thyroid nodule: Secondary | ICD-10-CM

## 2015-03-01 ENCOUNTER — Other Ambulatory Visit (HOSPITAL_COMMUNITY): Payer: Self-pay | Admitting: Otolaryngology

## 2015-03-01 DIAGNOSIS — E041 Nontoxic single thyroid nodule: Secondary | ICD-10-CM

## 2015-03-02 ENCOUNTER — Other Ambulatory Visit: Payer: Self-pay | Admitting: Radiology

## 2015-03-02 ENCOUNTER — Other Ambulatory Visit: Payer: Self-pay | Admitting: Obstetrics

## 2015-03-05 ENCOUNTER — Ambulatory Visit (HOSPITAL_COMMUNITY)
Admission: RE | Admit: 2015-03-05 | Discharge: 2015-03-05 | Disposition: A | Discharge status: Home / Self Care | Payer: BC Managed Care – PPO | Source: Ambulatory Visit | Attending: Otolaryngology | Admitting: Otolaryngology

## 2015-03-05 ENCOUNTER — Other Ambulatory Visit (HOSPITAL_COMMUNITY): Payer: Self-pay | Admitting: Otolaryngology

## 2015-03-05 DIAGNOSIS — E041 Nontoxic single thyroid nodule: Secondary | ICD-10-CM

## 2015-03-05 MED ORDER — LIDOCAINE HCL (PF) 1 % IJ SOLN
INTRAMUSCULAR | Status: AC
  Filled 2015-03-05: qty 10

## 2015-03-09 ENCOUNTER — Ambulatory Visit (HOSPITAL_COMMUNITY): Payer: BC Managed Care – PPO

## 2015-05-29 ENCOUNTER — Ambulatory Visit: Payer: Self-pay | Admitting: Podiatry

## 2015-06-25 ENCOUNTER — Ambulatory Visit (INDEPENDENT_AMBULATORY_CARE_PROVIDER_SITE_OTHER): Payer: BC Managed Care – PPO | Admitting: Podiatry

## 2015-06-25 ENCOUNTER — Ambulatory Visit (INDEPENDENT_AMBULATORY_CARE_PROVIDER_SITE_OTHER): Payer: BC Managed Care – PPO

## 2015-06-25 ENCOUNTER — Encounter: Payer: Self-pay | Admitting: Podiatry

## 2015-06-25 VITALS — BP 112/83 | HR 84 | Resp 16

## 2015-06-25 DIAGNOSIS — M6740 Ganglion, unspecified site: Secondary | ICD-10-CM | POA: Diagnosis not present

## 2015-06-25 DIAGNOSIS — T847XXA Infection and inflammatory reaction due to other internal orthopedic prosthetic devices, implants and grafts, initial encounter: Secondary | ICD-10-CM

## 2015-06-25 DIAGNOSIS — M898X9 Other specified disorders of bone, unspecified site: Secondary | ICD-10-CM | POA: Diagnosis not present

## 2015-06-25 DIAGNOSIS — M79672 Pain in left foot: Secondary | ICD-10-CM | POA: Diagnosis not present

## 2015-06-25 MED ORDER — LEVOFLOXACIN 750 MG PO TABS: 750.0000 mg | ORAL_TABLET | Freq: Every day | ORAL | Status: DC

## 2015-06-25 NOTE — Progress Notes (Addendum)
She presents today with chief complaint of week duration of pain to the third digit of the left foot. This has previously been surgically corrected with screws to the toes and she states everything has been going along perfectly until just recently where she started to develop irritation just below the level nail distal third toe. She states that some pus was drained out of it and it occasionally bleeds. She states that currently it seems to be doing better with some months and is in the toe.  Objective: Vital signs are stable she is alert and oriented 3. Pulses are palpable left foot. Neurologic sensorium is intact per Semmes-Weinstein monofilament and deep tendon reflexes are intact. Orthopedic evaluation demonstrates a rectus foot with a swollen third digit of the left foot distal medial aspect of the toe. Radiographs demonstrate today 3 views taken of the left foot what appears to be irritation and bone breakdown due to the motion of the screw at the level of the DIPJ third digit left. I am not able to currently visualize osteomyelitic changes however the time on physical exam does demonstrate erythema and edema with pain on palpation. Osteomyelitis cannot be excluded. She also has a palpable nonpulsatile mass to the dorsal aspect of the left foot. This is consistent with a ganglion cyst directly over the previous surgical site left foot.  Assessment: Painful internal fixation with ostial lysis distal aspect of the distal phalanx. Cannot rule out osteomyelitis. Ganglion cyst dorsal aspect left foot.  Plan: Started her on Levaquin 750 mg tablets 1 by mouth daily. Encouraged Epsom salts and water soaks. We went over a consent form today By lying number by number giving her ample time to ask questions she saw fit regarding removal of the internal fixation third digit left foot and excision of ganglion cyst. I answered all of these questions to the best of my ability in layman's terms. She understands this  and is amenable to it and will follow up with [place] in the near future for surgical intervention.  ---------------------------------------------- Patient came to office today 08/10/15 to pick up surgical packet and cam boot; Patient has surgery scheduled for 09/21/15. A CAM boot and pre-operative ice pack was given. The office today was out of scrub brushes so patient was instructed to stop back by the office to get it closer to date of surgery.  -Dr. Cannon Kettle

## 2015-06-26 ENCOUNTER — Telehealth: Payer: Self-pay | Admitting: *Deleted

## 2015-06-26 NOTE — Telephone Encounter (Signed)
"  I need paperwork for this patient regarding surgery.  She wants to do it on 11/11 or 12/16."  I'm returning your call.  He can do her surgery on 11//08/2015.  "Will you call and let the patient know?  She said she couldn't reach anyone to schedule it."  Yes, I will call the patient.  I received a call from the surgical center and was informed you wanted to schedule surgery.  He can do your surgery on November 11.  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  I will not be able to do it until December.  Do you have anything on 12/09?"  Yes, that date is available.  "What time will it be?"  The surgical center will call you a day or two prior to the surgery date.  They will give you the arrival time.  We don't give arrival times because a patient with certain medical problems have to go first or there may be a cancellation of a surgery.  "Okay, I understand but can you tell them I'd like it as early as possible?"  I will let her know.  I called and informed Caren Griffins at [facility] about the surgery date.  "Okay, send me the paperwork as soon as you can."  I faxed posting sheet and consent forms to the surgical center.

## 2015-08-14 ENCOUNTER — Encounter: Payer: Self-pay | Admitting: *Deleted

## 2015-08-14 ENCOUNTER — Telehealth: Payer: Self-pay | Admitting: *Deleted

## 2015-08-14 NOTE — Telephone Encounter (Signed)
"  My foot is killing me.  I thought I could hold off to 09/21/2015 but I can't.  Can he do it sooner?"  He can do it on 08/23/2015 but it will be late afternoon.  "That means not eating anything until late.  I can't do that.  Do you have anything else?"  He can do it around lunch time on 09/13/2015.  "I'll take that.  I have an address change, can you do that for me?"  I'll transfer you to someone in registration.  "Can you send me an email to remind me?"  I will try but if you are not on MyChart, I will not be able to.

## 2015-08-20 ENCOUNTER — Telehealth: Payer: Self-pay | Admitting: *Deleted

## 2015-08-20 NOTE — Telephone Encounter (Signed)
Pt states she is having surgery 09/13/2015, and needs to pick up pre-op scrub brush and ice packs.  I left message informing pt we did not have the pre op scrubs yet and if we didn't have them before the surgery the [facility] would perform the scrub there, but she could pick up an ice packs anytime between 8am and 5pm.

## 2015-09-04 ENCOUNTER — Telehealth: Payer: Self-pay | Admitting: *Deleted

## 2015-09-04 MED ORDER — TRAMADOL HCL 50 MG PO TABS: 50.0000 mg | ORAL_TABLET | Freq: Three times a day (TID) | ORAL | Status: DC | PRN

## 2015-09-04 NOTE — Telephone Encounter (Addendum)
Pt states today is the last day at work, before the Thanksgiving holiday.  She is having surgery next week and needs a note.  Left message informing pt I would need to know what her note should cover, and would send it to the fax number of her request.  Pt called again states she has paperwork for disability to be out for surgery, and her foot is swollen and so painful she can't work, so her leave will begin Monday. Pt asked if there could be a pain medication called in.  Dr. Jacqualyn Posey ordered Tramadol 50mg  #30 1 every 8 hours prn foot pain. Called to pt's pharmacy [facility] - [facility], and left message for pt at her home.

## 2015-09-11 ENCOUNTER — Other Ambulatory Visit: Payer: Self-pay | Admitting: Podiatry

## 2015-09-11 ENCOUNTER — Telehealth: Payer: Self-pay | Admitting: *Deleted

## 2015-09-11 DIAGNOSIS — M79673 Pain in unspecified foot: Secondary | ICD-10-CM

## 2015-09-11 MED ORDER — HYDROCODONE-ACETAMINOPHEN 5-325 MG PO TABS: 1.0000 | ORAL_TABLET | Freq: Four times a day (QID) | ORAL | Status: DC | PRN

## 2015-09-11 NOTE — Telephone Encounter (Signed)
PATIENT CAME TO THE OFFICE STATING TRAMADOL IS NOT HELPING AND ASKING FOR SOMETHING STRONGER. DR. Jacqualyn Posey PRESCRIBED VICODIN UNTIL SHE HAS SURGERY THIS WEEK.

## 2015-09-13 ENCOUNTER — Other Ambulatory Visit: Payer: Self-pay | Admitting: Podiatry

## 2015-09-13 DIAGNOSIS — M6740 Ganglion, unspecified site: Secondary | ICD-10-CM | POA: Diagnosis not present

## 2015-09-13 DIAGNOSIS — Z4889 Encounter for other specified surgical aftercare: Secondary | ICD-10-CM | POA: Diagnosis not present

## 2015-09-13 MED ORDER — OXYCODONE-ACETAMINOPHEN 10-325 MG PO TABS
1.0000 | ORAL_TABLET | ORAL | Status: DC | PRN
Start: 2015-09-13 — End: 2015-10-17

## 2015-09-13 MED ORDER — PROMETHAZINE HCL 25 MG PO TABS: 25.0000 mg | ORAL_TABLET | Freq: Three times a day (TID) | ORAL | Status: DC | PRN

## 2015-09-13 MED ORDER — CEPHALEXIN 500 MG PO CAPS: 500.0000 mg | ORAL_CAPSULE | Freq: Three times a day (TID) | ORAL | Status: DC

## 2015-09-17 ENCOUNTER — Telehealth: Payer: Self-pay | Admitting: *Deleted

## 2015-09-17 NOTE — Telephone Encounter (Addendum)
Pt states she had surgery on her left foot DOS 09/13/2015 and would like to know when she can drive.  Pt called again asking if and when she could drive after left foot surgery.  Dr. Milinda Pointer states In [place] it is illegal to drive with any type of brace, so must wait until in surgical shoe.  Left message with orders and explanation for pt.

## 2015-09-18 NOTE — Telephone Encounter (Signed)
She can drive once she is in the small darco shoe.  If she is  Currently wearing it, then she can drive.

## 2015-09-19 ENCOUNTER — Ambulatory Visit (INDEPENDENT_AMBULATORY_CARE_PROVIDER_SITE_OTHER): Payer: BC Managed Care – PPO | Admitting: Podiatry

## 2015-09-19 ENCOUNTER — Encounter: Payer: Self-pay | Admitting: Podiatry

## 2015-09-19 ENCOUNTER — Ambulatory Visit (INDEPENDENT_AMBULATORY_CARE_PROVIDER_SITE_OTHER): Payer: BC Managed Care – PPO

## 2015-09-19 DIAGNOSIS — Z9889 Other specified postprocedural states: Secondary | ICD-10-CM

## 2015-09-19 DIAGNOSIS — M6740 Ganglion, unspecified site: Secondary | ICD-10-CM

## 2015-09-19 DIAGNOSIS — M898X9 Other specified disorders of bone, unspecified site: Secondary | ICD-10-CM | POA: Diagnosis not present

## 2015-09-19 DIAGNOSIS — T847XXA Infection and inflammatory reaction due to other internal orthopedic prosthetic devices, implants and grafts, initial encounter: Secondary | ICD-10-CM

## 2015-09-19 NOTE — Progress Notes (Signed)
She presents today for a follow-up excision soft tissue mass dorsal aspect left foot as well as removal screw third toe left foot. She denies fever chills nausea vomiting muscle aches and pains.  Objective: Vital signs are stable she is alert and oriented 3. Pulses are strongly palpable. Dry sterile dressing once removed demonstrates moderate edema overlying the dorsal incision the distal incision to the third toe appears to be well coapted and healing. The toe is in a rectus position. Radiographs confirm partial removal of screw third toe left foot.  Assessment: Well-healed surgical for status post 1 week left.  Plan: Redress today dresser compressive dressing. Follow up with me in 1 week for suture removal.

## 2015-09-26 ENCOUNTER — Ambulatory Visit (INDEPENDENT_AMBULATORY_CARE_PROVIDER_SITE_OTHER): Payer: BC Managed Care – PPO | Admitting: Podiatry

## 2015-09-26 ENCOUNTER — Encounter: Payer: Self-pay | Admitting: Podiatry

## 2015-09-26 VITALS — BP 141/81 | HR 87 | Resp 16

## 2015-09-26 DIAGNOSIS — M6740 Ganglion, unspecified site: Secondary | ICD-10-CM

## 2015-09-26 DIAGNOSIS — Z9889 Other specified postprocedural states: Secondary | ICD-10-CM

## 2015-09-26 DIAGNOSIS — T847XXA Infection and inflammatory reaction due to other internal orthopedic prosthetic devices, implants and grafts, initial encounter: Secondary | ICD-10-CM

## 2015-09-26 NOTE — Progress Notes (Signed)
She presents today status post 2 weeks excision lesion dorsal aspect of left foot and removal screw third digit left foot. She states that she is doing very well.  Objective: Vital signs are stable she is alert and oriented 3. Pulses are strongly palpable. Sutures are intact and margins are well coapted margins remain well coapted after the sutures were removed today and I see no signs of infection. Much decrease in edema from last visit.  Assessment: Well-healing surgical foot 2 weeks left.  Plan: Removed all sutures today and placed in a compression anklet as well as a compression digital wrap. I would allow her to start getting this wet and soaking it in Epsom salts and warm water. I will follow-up with her in 2-3 weeks.

## 2015-10-03 ENCOUNTER — Encounter: Payer: Self-pay | Admitting: Podiatry

## 2015-10-11 ENCOUNTER — Encounter: Payer: Self-pay | Admitting: Podiatry

## 2015-10-11 NOTE — Progress Notes (Signed)
DOS 09-13-15  Removal screw 3rd toe left and Excision ganglion dorsal left

## 2015-10-17 ENCOUNTER — Encounter: Payer: Self-pay | Admitting: Podiatry

## 2015-10-17 ENCOUNTER — Ambulatory Visit (INDEPENDENT_AMBULATORY_CARE_PROVIDER_SITE_OTHER): Payer: BC Managed Care – PPO | Admitting: Podiatry

## 2015-10-17 ENCOUNTER — Telehealth: Payer: Self-pay | Admitting: *Deleted

## 2015-10-17 VITALS — BP 112/68 | HR 85 | Resp 16

## 2015-10-17 DIAGNOSIS — M6740 Ganglion, unspecified site: Secondary | ICD-10-CM

## 2015-10-17 DIAGNOSIS — T847XXA Infection and inflammatory reaction due to other internal orthopedic prosthetic devices, implants and grafts, initial encounter: Secondary | ICD-10-CM

## 2015-10-17 DIAGNOSIS — Z9889 Other specified postprocedural states: Secondary | ICD-10-CM

## 2015-10-17 NOTE — Progress Notes (Signed)
She presents today for follow up visit status post excision soft tissue mass dorsal aspect left foot and removal of screw third digit left foot. She states this seems to be doing better however is simply inflamed. Still a little tender. Date of surgery 09/21/2015.  Objective: No erythema mild edema no cellulitis drainage or odor. No reproducible pain. She does have some mild tenderness on range of motion of the toe third left.  Assessment: Well-healing surgical foot left.  Plan: We'll leave her out of work 1 more week G back into regular shoes prior to owing back to work. Follow up with her in 6 weeks sooner if needed.

## 2015-10-17 NOTE — Telephone Encounter (Signed)
"  I'm working on short term disability for the patient.  I need to obtain CPT codes for the type of surgery patient had.  She had surgery on 12/09.  I need to know what kind of surgery she had.  Please give me a call."

## 2015-10-22 ENCOUNTER — Telehealth: Payer: Self-pay | Admitting: *Deleted

## 2015-10-22 NOTE — Telephone Encounter (Addendum)
Pt's FMLA request CPT codes for surgery.  17-Nov-2015 - Pt states she went out for about 4 minutes today and her surgery foot took 40 minutes to get warm again.  I informed pt often it takes the surgery foot a few weeks to get its circulatory system back to normal and often cold can cause a spasm effect, make certain the foot is dressed warmly and kept dry.  Pt states she is just now able to wear athletic shoes and would like to begin work 10/29/2015 and continue to wear athletic shoes.  Pt request a note to go to [facility], Attn: Gean Birchwood [phone].  Letter faxed to Gean Birchwood, and emailed to pt - [email or web address].

## 2015-10-22 NOTE — Telephone Encounter (Signed)
I'm returning Craig's call.  "Cecilie Lowers was calling to verify patient had surgery on 09/21/2015 and to get the procedures."  She actually rescheduled surgery from 09/21/2015 to 09/13/2015.  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  "Do you have the CPT codes?"  The codes are 20680 and 28090.  "Okay, I will pass this information on to [place]."

## 2015-10-23 ENCOUNTER — Encounter: Payer: Self-pay | Admitting: *Deleted

## 2015-11-21 ENCOUNTER — Encounter: Payer: BC Managed Care – PPO | Admitting: Podiatry

## 2015-11-28 ENCOUNTER — Encounter: Payer: Self-pay | Admitting: Internal Medicine

## 2016-01-09 ENCOUNTER — Institutional Professional Consult (permissible substitution): Payer: BC Managed Care – PPO | Admitting: Neurology

## 2016-02-03 ENCOUNTER — Other Ambulatory Visit: Payer: Self-pay | Admitting: Podiatry

## 2016-02-15 ENCOUNTER — Other Ambulatory Visit: Payer: Self-pay | Admitting: Physician Assistant

## 2016-02-15 DIAGNOSIS — R1011 Right upper quadrant pain: Secondary | ICD-10-CM

## 2016-02-21 ENCOUNTER — Institutional Professional Consult (permissible substitution): Payer: BC Managed Care – PPO | Admitting: Neurology

## 2016-02-22 ENCOUNTER — Other Ambulatory Visit: Payer: BC Managed Care – PPO

## 2016-02-25 ENCOUNTER — Ambulatory Visit
Admission: RE | Admit: 2016-02-25 | Discharge: 2016-02-25 | Disposition: A | Discharge status: Home / Self Care | Payer: BC Managed Care – PPO | Source: Ambulatory Visit | Attending: Physician Assistant | Admitting: Physician Assistant

## 2016-02-25 DIAGNOSIS — R1011 Right upper quadrant pain: Secondary | ICD-10-CM

## 2016-04-23 ENCOUNTER — Other Ambulatory Visit: Payer: Self-pay | Admitting: Gastroenterology

## 2016-10-13 HISTORY — PX: CARPAL TUNNEL RELEASE: SHX101

## 2016-11-12 ENCOUNTER — Encounter: Payer: Self-pay | Admitting: Neurology

## 2016-11-12 ENCOUNTER — Telehealth: Payer: Self-pay | Admitting: Neurology

## 2016-11-12 ENCOUNTER — Ambulatory Visit (INDEPENDENT_AMBULATORY_CARE_PROVIDER_SITE_OTHER): Payer: BC Managed Care – PPO | Admitting: Neurology

## 2016-11-12 VITALS — BP 133/89 | HR 77 | Resp 20 | Ht 74.0 in | Wt 278.0 lb

## 2016-11-12 DIAGNOSIS — R4701 Aphasia: Secondary | ICD-10-CM

## 2016-11-12 DIAGNOSIS — G629 Polyneuropathy, unspecified: Secondary | ICD-10-CM

## 2016-11-12 DIAGNOSIS — R202 Paresthesia of skin: Secondary | ICD-10-CM

## 2016-11-12 DIAGNOSIS — R270 Ataxia, unspecified: Secondary | ICD-10-CM | POA: Diagnosis not present

## 2016-11-12 DIAGNOSIS — R51 Headache: Secondary | ICD-10-CM

## 2016-11-12 DIAGNOSIS — R519 Headache, unspecified: Secondary | ICD-10-CM

## 2016-11-12 DIAGNOSIS — R200 Anesthesia of skin: Secondary | ICD-10-CM

## 2016-11-12 DIAGNOSIS — W19XXXA Unspecified fall, initial encounter: Secondary | ICD-10-CM

## 2016-11-12 DIAGNOSIS — G2581 Restless legs syndrome: Secondary | ICD-10-CM

## 2016-11-12 DIAGNOSIS — M6281 Muscle weakness (generalized): Secondary | ICD-10-CM | POA: Diagnosis not present

## 2016-11-12 DIAGNOSIS — F419 Anxiety disorder, unspecified: Secondary | ICD-10-CM

## 2016-11-12 MED ORDER — ALPRAZOLAM 0.5 MG PO TABS: ORAL_TABLET | ORAL | 0 refills | Status: DC

## 2016-11-12 NOTE — Patient Instructions (Signed)
Remember to drink plenty of fluid, eat healthy meals and do not skip any meals. Try to eat protein with a every meal and eat a healthy snack such as fruit or nuts in between meals. Try to keep a regular sleep-wake schedule and try to exercise daily, particularly in the form of walking, 20-30 minutes a day, if you can.   As far as diagnostic testing: Labwork, EMG/NCS and MRI brain  I would like to see you back for emg/ncs, sooner if we need to. Please call us with any interim questions, concerns, problems, updates or refill requests.    Our phone number is [phone]. We also have an after hours call service for urgent matters and there is a physician on-call for urgent questions. For any emergencies you know to call 911 or go to the nearest emergency room

## 2016-11-12 NOTE — Telephone Encounter (Signed)
Faxed printed rx to [phone]. Received confirmation.

## 2016-11-12 NOTE — Telephone Encounter (Signed)
Called and spoke to pt. Advised we are sending xanax in for her to take prior to MRI. Went over instructions with and verified pharmacy.   Awaiting AA,MD signature

## 2016-11-12 NOTE — Telephone Encounter (Signed)
Pt request anxiety med to take prior to mri.

## 2016-11-12 NOTE — Progress Notes (Signed)
[facility]    Provider:  Dr Jaynee Eagles Referring Provider: Shanon Rosser, PA-C in same office as Imelda Pillow, NP Primary Care Physician:  Imelda Pillow, NP, long, Scott PA  CC:  Hand Numbness  HPI:  [name] is a 56 y.o. female here as a referral from Dr. Laverta Baltimore for hand numbness. PMHx of remote history of migraines as a teen, HTN, neck pain and strain of neck muscle. She started noticing symptoms 4 weeks ago. She noticed it in her left hand first and her hand and her arm went numb like it is falling asleep. 2-3 days later she noticed her right foot and her leg fell asleep wasn't positional. A week later started becoming more frequent and continuous, she also has a headache on the left side of her head it feels like an aching pain radiating from the temple to the back of the head. She is experiencing restless legs as well. Now the numbness is mostly on the left side in the arm and occ in the right, feels like her hand and arm is waking up pins and needles, it is sporadic and happens frequently throughout the day. Also happen in her legs below the knee the whole leg (in no dermatomal pattern) and she also has the headache which happens at the same time. Symptoms can last for 2-3 minutes and is worsening. The numbness can be severe and disturbing, uncomfortable.The headache is painful can be a 10/10. Alleve helps the headache. Unknown triggers, no pattern can be walking, sitting, sleeping any time of day. She has weakness and sometimes when she is walking she fell. She has neck pain and muscle tightness, no joint pain, no rashes, vision changes, she feels she is having difficulty walking and she is having difficulty speaking and mispronouncing words. No new medications, no exposure to toxins, no inciting events. No other focal neurologic deficits, associated symptoms, inciting events or modifiable factors.   Reviewed notes, labs and imaging from outside physicians, which  showed: Reviewed primary care notes. Patient reports numbness but no loss of consciousness, no weakness, no seizures, no dizziness and no headaches. No fevers, no night sweats, no significant weight gain or weight loss. No dry eyes, no irritation no vision changes. Over the past 1-2 weeks patient was complaining of bilateral upper extremity and lower extremity distal paresthetic pains. Mild but persistent for more than 1 minute area of the sensations radiating approximately at times. Position changes don't seem to help. No previous history of trauma or neurologic disease. She denies weakness in the hands or dropping things. Does report a gait at times right hip feels weak like it's going to give out over the past week. Reviewed exam which was unremarkable including psychiatric, head, eyes, ears nose mouth and throat, neck, lungs, cardiovascular, abdomen, musculoskeletal, neurologic, skin, back, constitutional, mental status, cranial nerves, spine. She was diagnosed with numbness of hand, bilateral upper extremity sporadic intermittent insidious onset with a stable quality moderate severity duration of 1 week with associated pins and needles. Vitamin B12 and folate were ordered, CBC, CMP and hemoglobin A1c were all ordered. These were ordered 11/03/2016.  BMP and CMP were unremarkable with creatinine 0.97 and BUN 12. TSH normal 1.446. However these labs were back in 2015. We'll request newer lab results to compare as above. Review of Systems: Patient complains of symptoms per HPI as well as the following symptoms: snoring, anxiety. Pertinent negatives per HPI. All others negative.   Social History  Social History  . Marital status: Single    Spouse name: N/A  . Number of children: 2  . Years of education: N/A   Occupational History  . teacher    Social History Main Topics  . Smoking status: Never Smoker  . Smokeless tobacco: Never Used  . Alcohol use No  . Drug use: No  . Sexual activity: Yes      Birth control/ protection: Surgical   Other Topics Concern  . Not on file   Social History Narrative   Teacher.     Family History  Problem Relation Age of Onset  . Heart attack Mother 35  . Heart disease Mother   . Heart attack Maternal Grandmother     age 40 or 80  . Heart disease Maternal Grandmother   . Neuropathy Neg Hx     Past Medical History:  Diagnosis Date  . GERD (gastroesophageal reflux disease)    w nonobstructing esophageal stricture  . History of ETT 6/10   myoview EF 65%, normal wall motion, no ischemia or infarction, poor exercise capacity  . HTN (hypertension)   . Normal echocardiogram 6/10   EF 09-32% moderate diastolic dysfunction, mild LAE  . Obesity   . S/P partial hysterectomy     Past Surgical History:  Procedure Laterality Date  . FOOT SURGERY    . hysterectomy (other)      Current Outpatient Prescriptions  Medication Sig Dispense Refill  . aspirin (ASPIR-81) 81 MG EC tablet Take 81 mg by mouth daily.      . carvedilol (COREG) 6.25 MG tablet TAKE 1 TABLET BY MOUTH TWICE DAILY 180 tablet 1  . Cholecalciferol (VITAMIN D PO) Take 1 tablet by mouth daily.    Marland Kitchen ibuprofen (ADVIL,MOTRIN) 600 MG tablet Take 1 tablet (600 mg total) by mouth every 6 (six) hours as needed. 30 tablet 0  . lisinopril-hydrochlorothiazide (PRINZIDE,ZESTORETIC) 20-12.5 MG per tablet TAKE 2 TABLETS BY MOUTH DAILY 180 tablet 1   No current facility-administered medications for this visit.     Allergies as of 11/12/2016  . (No Known Allergies)    Vitals: BP 133/89   Pulse 77   Resp 20   Ht _0  (1.88 m)   Wt 278 lb (126.1 kg)   BMI 35.69 kg/m  Last Weight:  Wt Readings from Last 1 Encounters:  11/12/16 278 lb (126.1 kg)   Last Height:   Ht Readings from Last 1 Encounters:  11/12/16 _1  (1.88 m)   Physical exam: Exam: Gen: NAD, conversant, well nourised, obese, well groomed                     CV: RRR, no MRG. No Carotid Bruits. No peripheral edema,  warm, nontender Eyes: Conjunctivae clear without exudates or hemorrhage  Neuro: Detailed Neurologic Exam  Speech:    Speech is normal; fluent and spontaneous with normal comprehension.  Cognition:    The patient is oriented to person, place, and time;     recent and remote memory intact;     language fluent;     normal attention, concentration,     fund of knowledge Cranial Nerves:    The pupils are equal, round, and reactive to light. The fundi are normal and spontaneous venous pulsations are present. Visual fields are full to finger confrontation. Extraocular movements are intact. Trigeminal sensation is intact and the muscles of mastication are normal. The face is symmetric. The palate elevates in the midline. Hearing  intact. Voice is normal. Shoulder shrug is normal. The tongue has normal motion without fasciculations.   Coordination:    Normal finger to nose and heel to shin. Normal rapid alternating movements.   Gait:    Heel-toe and tandem gait are normal.   Motor Observation:    No asymmetry, no atrophy, and no involuntary movements noted. Tone:    Normal muscle tone.    Posture:    Posture is normal. normal erect    Strength:    Strength is V/V in the upper and lower limbs.      Sensation: intact to LT     Reflex Exam:  DTR's:    Deep tendon reflexes in the upper and lower extremities are normal bilaterally.   Toes:    The toes are downgoing bilaterally.   Clonus:    Clonus is absent.       Assessment/Plan:  56 year old with hand and leg weakness, paresthesias, headache, falls.  Emg/ncs of both arms and right leg for paresthesias, CTS vs radic or polyneuropathy MRI of the brain to evaluate for lesions, MS or other intracranial abnormalities Extensive lab tests Request results from lab testing at pcp (B12)  Orders Placed This Encounter  Procedures  . MR BRAIN W WO CONTRAST  . Angiotensin converting enzyme  . HIV antibody  . Sedimentation rate  .  ANA w/Reflex  . Sjogren's syndrome antibods(ssa + ssb)  . Pan-ANCA  . RPR  . Hepatitis C antibody  . Rheumatoid factor  . Heavy metals, blood  . Vitamin B6  . Multiple Myeloma Panel (SPEP&IFE w/QIG)  . B. burgdorfi Antibody  . Vitamin B1  . Basic Metabolic Panel  . TSH  . Ferritin  . CBC  . NCV with EMG(electromyography)   Benna Dunks, Luane School, NP, long, Scott PA  Sarina Ill, MD  [facility] [address] [place], [place] [address]  Phone [phone] Fax [phone]

## 2016-11-17 ENCOUNTER — Telehealth: Payer: Self-pay

## 2016-11-17 LAB — BASIC METABOLIC PANEL
BUN / CREAT RATIO: 14 (ref 9–23)
BUN: 12 mg/dL (ref 6–24)
CO2: 25 mmol/L (ref 18–29)
CREATININE: 0.86 mg/dL (ref 0.57–1.00)
Calcium: 9.6 mg/dL (ref 8.7–10.2)
Chloride: 102 mmol/L (ref 96–106)
GFR, EST AFRICAN AMERICAN: 88 mL/min/{1.73_m2} (ref >59)
GFR, EST NON AFRICAN AMERICAN: 76 mL/min/{1.73_m2} (ref >59)
GLUCOSE: 91 mg/dL (ref 65–99)
Potassium: 4 mmol/L (ref 3.5–5.2)
SODIUM: 143 mmol/L (ref 134–144)

## 2016-11-17 LAB — MULTIPLE MYELOMA PANEL, SERUM
ALBUMIN SERPL ELPH-MCNC: 3.9 g/dL (ref 2.9–4.4)
ALPHA 1: 0.3 g/dL (ref 0.0–0.4)
ALPHA2 GLOB SERPL ELPH-MCNC: 0.8 g/dL (ref 0.4–1.0)
Albumin/Glob SerPl: 1.2 (ref 0.7–1.7)
B-GLOBULIN SERPL ELPH-MCNC: 1.1 g/dL (ref 0.7–1.3)
GAMMA GLOB SERPL ELPH-MCNC: 1.2 g/dL (ref 0.4–1.8)
GLOBULIN, TOTAL: 3.3 g/dL (ref 2.2–3.9)
IGG (IMMUNOGLOBIN G), SERUM: 1226 mg/dL (ref 700–1600)
IgA/Immunoglobulin A, Serum: 174 mg/dL (ref 87–352)
IgM (Immunoglobulin M), Srm: 98 mg/dL (ref 26–217)
Total Protein: 7.2 g/dL (ref 6.0–8.5)

## 2016-11-17 LAB — FERRITIN: Ferritin: 143 ng/mL (ref 15–150)

## 2016-11-17 LAB — HEPATITIS C ANTIBODY

## 2016-11-17 LAB — TSH: TSH: 1.94 u[IU]/mL (ref 0.450–4.500)

## 2016-11-17 LAB — B. BURGDORFI ANTIBODIES: Lyme IgG/IgM Ab: <0.91 {ISR} (ref 0.00–0.90)

## 2016-11-17 LAB — HIV ANTIBODY (ROUTINE TESTING W REFLEX): HIV Screen 4th Generation wRfx: NONREACTIVE

## 2016-11-17 LAB — CBC
HEMATOCRIT: 40.5 % (ref 34.0–46.6)
HEMOGLOBIN: 12.6 g/dL (ref 11.1–15.9)
MCH: 26.4 pg — AB (ref 26.6–33.0)
MCHC: 31.1 g/dL — AB (ref 31.5–35.7)
MCV: 85 fL (ref 79–97)
Platelets: 199 10*3/uL (ref 150–379)
RBC: 4.77 x10E6/uL (ref 3.77–5.28)
RDW: 13.8 % (ref 12.3–15.4)
WBC: 4.9 10*3/uL (ref 3.4–10.8)

## 2016-11-17 LAB — PAN-ANCA
ANCA Proteinase 3: <3.5 U/mL (ref 0.0–3.5)
Atypical pANCA: <1:20 {titer}
C-ANCA: <1:20 {titer}
Myeloperoxidase Ab: <9 U/mL (ref 0.0–9.0)
P-ANCA: <1:20 {titer}

## 2016-11-17 LAB — SEDIMENTATION RATE: SED RATE: 27 mm/h (ref 0–40)

## 2016-11-17 LAB — SJOGREN'S SYNDROME ANTIBODS(SSA + SSB)

## 2016-11-17 LAB — RPR: RPR Ser Ql: NONREACTIVE

## 2016-11-17 LAB — RHEUMATOID FACTOR: Rhuematoid fact SerPl-aCnc: <10 IU/mL (ref 0.0–13.9)

## 2016-11-17 LAB — VITAMIN B1: Thiamine: 131 nmol/L (ref 66.5–200.0)

## 2016-11-17 LAB — ANGIOTENSIN CONVERTING ENZYME

## 2016-11-17 LAB — VITAMIN B6: Vitamin B6: 12.8 ug/L (ref 2.0–32.8)

## 2016-11-17 LAB — HEAVY METALS, BLOOD
Arsenic: 6 ug/L (ref 2–23)
Lead, Blood: 1 ug/dL (ref 0–19)
MERCURY: NOT DETECTED ug/L (ref 0.0–14.9)

## 2016-11-17 LAB — ANA W/REFLEX: Anti Nuclear Antibody(ANA): NEGATIVE

## 2016-11-17 NOTE — Telephone Encounter (Signed)
Called pt w/ normal lab results. May call back w/ additional questions/conerns.

## 2016-11-17 NOTE — Telephone Encounter (Signed)
-----   Message from Hope Pigeon, RN sent at 11/17/2016  2:16 PM EST -----   ----- Message ----- From: Melvenia Beam, MD Sent: 11/17/2016   2:07 PM To: Hope Pigeon, RN  Labs all normal thanks

## 2016-11-26 ENCOUNTER — Encounter: Payer: Self-pay | Admitting: Neurology

## 2016-11-26 ENCOUNTER — Ambulatory Visit (INDEPENDENT_AMBULATORY_CARE_PROVIDER_SITE_OTHER): Payer: BC Managed Care – PPO

## 2016-11-26 DIAGNOSIS — G2581 Restless legs syndrome: Secondary | ICD-10-CM | POA: Diagnosis not present

## 2016-11-26 DIAGNOSIS — M6281 Muscle weakness (generalized): Secondary | ICD-10-CM | POA: Diagnosis not present

## 2016-11-26 DIAGNOSIS — R200 Anesthesia of skin: Secondary | ICD-10-CM | POA: Diagnosis not present

## 2016-11-26 DIAGNOSIS — R4701 Aphasia: Secondary | ICD-10-CM | POA: Diagnosis not present

## 2016-11-26 DIAGNOSIS — R270 Ataxia, unspecified: Secondary | ICD-10-CM

## 2016-11-26 DIAGNOSIS — G629 Polyneuropathy, unspecified: Secondary | ICD-10-CM | POA: Diagnosis not present

## 2016-11-26 DIAGNOSIS — R202 Paresthesia of skin: Secondary | ICD-10-CM | POA: Diagnosis not present

## 2016-11-26 DIAGNOSIS — R51 Headache: Secondary | ICD-10-CM

## 2016-11-26 DIAGNOSIS — W19XXXA Unspecified fall, initial encounter: Secondary | ICD-10-CM

## 2016-11-26 DIAGNOSIS — R519 Headache, unspecified: Secondary | ICD-10-CM

## 2016-11-26 MED ORDER — GADOPENTETATE DIMEGLUMINE 469.01 MG/ML IV SOLN: 20.0000 mL | Freq: Once | INTRAVENOUS | Status: AC | PRN

## 2016-12-01 ENCOUNTER — Telehealth: Payer: Self-pay | Admitting: Neurology

## 2016-12-01 NOTE — Telephone Encounter (Signed)
Pt request MRI results. Pt was advised it could take 2-4 business days for results

## 2016-12-01 NOTE — Telephone Encounter (Signed)
I spoke to patient. I needed to review it with Dr. Felecia Shelling. I am going to order an LP. Also I need the patient to come in over the next week so I can review it in person with her, noon or 430 any day is fine if I am booked thank you!!

## 2016-12-02 ENCOUNTER — Other Ambulatory Visit: Payer: Self-pay | Admitting: Neurology

## 2016-12-02 DIAGNOSIS — G35 Multiple sclerosis: Secondary | ICD-10-CM

## 2016-12-02 NOTE — Telephone Encounter (Signed)
Called pt and appt scheduled for tomorrow morning at 9.

## 2016-12-03 ENCOUNTER — Encounter: Payer: Self-pay | Admitting: Neurology

## 2016-12-03 ENCOUNTER — Ambulatory Visit (INDEPENDENT_AMBULATORY_CARE_PROVIDER_SITE_OTHER): Payer: BC Managed Care – PPO | Admitting: Neurology

## 2016-12-03 ENCOUNTER — Ambulatory Visit (INDEPENDENT_AMBULATORY_CARE_PROVIDER_SITE_OTHER): Payer: BC Managed Care – PPO

## 2016-12-03 VITALS — BP 152/84 | HR 80 | Ht 74.0 in | Wt 279.4 lb

## 2016-12-03 DIAGNOSIS — G35D Multiple sclerosis, unspecified: Secondary | ICD-10-CM

## 2016-12-03 DIAGNOSIS — R930 Abnormal findings on diagnostic imaging of skull and head, not elsewhere classified: Secondary | ICD-10-CM

## 2016-12-03 DIAGNOSIS — G35 Multiple sclerosis: Secondary | ICD-10-CM | POA: Diagnosis not present

## 2016-12-03 DIAGNOSIS — R9082 White matter disease, unspecified: Secondary | ICD-10-CM

## 2016-12-03 MED ORDER — GADOPENTETATE DIMEGLUMINE 469.01 MG/ML IV SOLN: 20.0000 mL | Freq: Once | INTRAVENOUS | Status: AC | PRN

## 2016-12-03 NOTE — Patient Instructions (Signed)
Remember to drink plenty of fluid, eat healthy meals and do not skip any meals. Try to eat protein with a every meal and eat a healthy snack such as fruit or nuts in between meals. Try to keep a regular sleep-wake schedule and try to exercise daily, particularly in the form of walking, 20-30 minutes a day, if you can.   As far as diagnostic testing: Lumbar Puncture  I would like to see you back after LP, sooner if we need to. Please call us with any interim questions, concerns, problems, updates or refill requests.   Our phone number is [phone]. We also have an after hours call service for urgent matters and there is a physician on-call for urgent questions. For any emergencies you know to call 911 or go to the nearest emergency room   Cerebrospinal Fluid Analysis Why am I having this test? Cerebrospinal fluid (CSF) analysis is a group of lab tests that are done on a sample of the fluid that surrounds and protects your brain and spinal cord. You may have a CSF analysis if your health care provider thinks you may have:  An infection of your brain (encephalitis) or an infection of surrounding membranes (meningitis).  Bleeding around your brain (subarachnoid hemorrhage).  Certaindiseases that affect your brain and spinal cord, such as multiple sclerosis and Guillain-Barre syndrome.  A tumor on your brain or spinal cord. Tumors may start in your brain (primary tumor). They may also spread to your spine or other areas of your body (metastatic tumor). For people with HIV (human immunodeficiency virus) infection, CSF analysis may be done to check for infections that can develop because of a weakened immune system. What kind of sample is taken? CSF is usually collected from a lumbar puncture, also known as a spinal tap. This procedure involves inserting a long, thin needle through the skin and between the bones (vertebrae) in the lower back until the needle reaches the CSF space. How do I  prepare for this test?  Before the spinal tap, you will be asked to:  Urinate.  Have a bowel movement.  Your health care provider may do a neurological exam of your legs before the spinal tap.  Be aware that you will need to lie still while having the spinal tap. How are the results reported? Your results may be reported in different ways and are dependent on the kinds of tests that were performed. The results may include information such as cell count, protein levels, or the presence of bacteria. It is most common for your results to be reported as:  Reference ranges, values, and intervals.  Positive or negative.  Descriptions based on appearance.  Descriptions based on smell.  Descriptions of whether something is found or not found in the sample. It is up to you to get your test results. Ask your health care provider, or the department that is doing the test, when your results will be ready. What do the results mean? This test can produce many different results. Talk with your health care provider about your results, treatment options, and if necessary, the need for more tests. Talk with your health care provider if you have any questions about your results. Talk with your health care provider to discuss your results, treatment options, and if necessary, the need for more tests. Talk with your health care provider if you have any questions about your results. This information is not intended to replace advice given to you by your health care  provider. Make sure you discuss any questions you have with your health care provider. Document Released: 11/01/2004 Document Revised: 06/04/2016 Document Reviewed: 03/10/2016 Elsevier Interactive Patient Education  2017 [employer].

## 2016-12-03 NOTE — Progress Notes (Addendum)
[facility]   Provider:  Dr Jaynee Eagles Referring Provider: Shanon Rosser, PA-C in same office as Imelda Pillow, NP Primary Care Physician:  Imelda Pillow, NP, long, Scott PA  CC:  Hand Numbness  Interval history 12/03/2016: Patient returns today for follow up of MRI brain. Reviewed differential and lab testing, need LP for MS evaluation. And MRI cervical spine.    MRI brain: FINDINGS:  The brain parenchyma shows scattered tiny periventricular and subcortical as well as larger peri atrial white matter hyperintensities which are non-specific and may be seen in variety of conditions including small vessel disease, vasculitis, demyelinating, autoimmune or infectious and inflammatory conditions. None of these show any post contrast enhancement. No other structural lesion, tumor or infarcts are noted. No abnormal lesions are seen on diffusion-weighted views to suggest acute ischemia. The cortical sulci, fissures and cisterns are normal in size and appearance. Lateral, third and fourth ventricle are normal in size and appearance. No extra-axial fluid collections are seen. No evidence of mass effect or midline shift.  No abnormal lesions are seen on post contrast views.  On sagittal views the posterior fossa, pituitary gland and corpus callosum are unremarkable. No evidence of intracranial hemorrhage on gradient-echo views. The orbits and their contents, paranasal sinuses and calvarium are unremarkable.  Intracranial flow voids are present.    IMPRESSION:Abnormal MRI scan of the brain showing scattered tiny periventricular and subcortical as well as larger peri atrial white matter hyperintensities which are non-specific with the differential discussed above. No enhancing lesions are noted.   HPI:  [name] is a 56 y.o. female here as a referral from Dr. Laverta Baltimore for hand numbness. PMHx of remote history of migraines as a teen, HTN, neck pain and strain of neck muscle.  She started noticing symptoms 4 weeks ago. She noticed it in her left hand first and her hand and her arm went numb like it is falling asleep. 2-3 days later she noticed her right foot and her leg fell asleep wasn't positional. A week later started becoming more frequent and continuous, she also has a headache on the left side of her head it feels like an aching pain radiating from the temple to the back of the head. She is experiencing restless legs as well. Now the numbness is mostly on the left side in the arm and occ in the right, feels like her hand and arm is waking up pins and needles, it is sporadic and happens frequently throughout the day. Also happen in her legs below the knee the whole leg (in no dermatomal pattern) and she also has the headache which happens at the same time. Symptoms can last for 2-3 minutes and is worsening. The numbness can be severe and disturbing, uncomfortable.The headache is painful can be a 10/10. Alleve helps the headache. Unknown triggers, no pattern can be walking, sitting, sleeping any time of day. She has weakness and sometimes when she is walking she fell. She has neck pain and muscle tightness, no joint pain, no rashes, vision changes, she feels she is having difficulty walking and she is having difficulty speaking and mispronouncing words. No new medications, no exposure to toxins, no inciting events. No other focal neurologic deficits, associated symptoms, inciting events or modifiable factors.   Reviewed notes, labs and imaging from outside physicians, which showed: Reviewed primary care notes. Patient reports numbness but no loss of consciousness, no weakness, no seizures, no dizziness and no headaches. No fevers, no night sweats, no  significant weight gain or weight loss. No dry eyes, no irritation no vision changes. Over the past 1-2 weeks patient was complaining of bilateral upper extremity and lower extremity distal paresthetic pains. Mild but persistent for  more than 1 minute area of the sensations radiating approximately at times. Position changes don't seem to help. No previous history of trauma or neurologic disease. She denies weakness in the hands or dropping things. Does report a gait at times right hip feels weak like it's going to give out over the past week. Reviewed exam which was unremarkable including psychiatric, head, eyes, ears nose mouth and throat, neck, lungs, cardiovascular, abdomen, musculoskeletal, neurologic, skin, back, constitutional, mental status, cranial nerves, spine. She was diagnosed with numbness of hand, bilateral upper extremity sporadic intermittent insidious onset with a stable quality moderate severity duration of 1 week with associated pins and needles. Vitamin B12 and folate were ordered, CBC, CMP and hemoglobin A1c were all ordered. These were ordered 11/03/2016.  BMP and CMP were unremarkable with creatinine 0.97 and BUN 12. TSH normal 1.446. However these labs were back in 2015. We'll request newer lab results to compare as above. Review of Systems: Patient complains of symptoms per HPI as well as the following symptoms: snoring, anxiety. Pertinent negatives per HPI. All others negative.   Social History   Social History  . Marital status: Single    Spouse name: N/A  . Number of children: 2  . Years of education: N/A   Occupational History  . teacher    Social History Main Topics  . Smoking status: Never Smoker  . Smokeless tobacco: Never Used  . Alcohol use No  . Drug use: No  . Sexual activity: Yes    Birth control/ protection: Surgical   Other Topics Concern  . Not on file   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 w/ her family   Right-handed   Caffeine: 1/2 cup coffee during the school year, tea    Family History  Problem Relation Age of Onset  . Heart attack Mother 11  . Heart disease Mother   . Heart attack Maternal Grandmother     age 44 or 75  . Heart disease  Maternal Grandmother   . Neuropathy Neg Hx     Past Medical History:  Diagnosis Date  . Fatigue   . GERD (gastroesophageal reflux disease)    w nonobstructing esophageal stricture  . Headache   . Heat intolerance   . History of ETT 6/10   myoview EF 65%, normal wall motion, no ischemia or infarction, poor exercise capacity  . HTN (hypertension)   . Normal echocardiogram 6/10   EF [id number] moderate diastolic dysfunction, mild LAE  . Numbness and tingling   . Obesity   . S/P partial hysterectomy     Past Surgical History:  Procedure Laterality Date  . FOOT SURGERY    . hysterectomy (other)      Current Outpatient Prescriptions  Medication Sig Dispense Refill  . ALPRAZolam (XANAX) 0.5 MG tablet Take 1-2 tablets 30-60 minutes prior to MRI. Can take additional tablet if needed at time of test. Do not drive. Can cause sedation. Must have driver to and from test. 3 tablet 0  . aspirin (ASPIR-81) 81 MG EC tablet Take 81 mg by mouth daily.      . carvedilol (COREG) 6.25 MG tablet TAKE 1 TABLET BY MOUTH TWICE DAILY 180 tablet 1  . Cholecalciferol (VITAMIN D PO) Take 1  tablet by mouth daily.    Marland Kitchen ibuprofen (ADVIL,MOTRIN) 600 MG tablet Take 1 tablet (600 mg total) by mouth every 6 (six) hours as needed. 30 tablet 0  . lisinopril-hydrochlorothiazide (PRINZIDE,ZESTORETIC) 20-12.5 MG per tablet TAKE 2 TABLETS BY MOUTH DAILY 180 tablet 1   No current facility-administered medications for this visit.    Facility-Administered Medications Ordered in Other Visits  Medication Dose Route Frequency Provider Last Rate Last Dose  . gadopentetate dimeglumine (MAGNEVIST) injection 20 mL  20 mL Intravenous Once PRN Melvenia Beam, MD        Allergies as of 12/03/2016  . (No Known Allergies)    Vitals: BP (!) 152/84   Pulse 80   Ht 6\' 2"  (1.88 m)   Wt 279 lb 6.4 oz (126.7 kg)   BMI 35.87 kg/m  Last Weight:  Wt Readings from Last 1 Encounters:  12/03/16 279 lb 6.4 oz (126.7 kg)   Last  Height:   Ht Readings from Last 1 Encounters:  12/03/16 6\' 2"  (1.88 m)    Physical exam: Exam: Gen: NAD, conversant, well nourised, obese, well groomed                     CV: RRR, no MRG. No Carotid Bruits. No peripheral edema, warm, nontender Eyes: Conjunctivae clear without exudates or hemorrhage  Neuro: Detailed Neurologic Exam  Speech:    Speech is normal; fluent and spontaneous with normal comprehension.  Cognition:    The patient is oriented to person, place, and time;     recent and remote memory intact;     language fluent;     normal attention, concentration,     fund of knowledge Cranial Nerves:    The pupils are equal, round, and reactive to light. The fundi are normal and spontaneous venous pulsations are present. Visual fields are full to finger confrontation. Extraocular movements are intact. Trigeminal sensation is intact and the muscles of mastication are normal. The face is symmetric. The palate elevates in the midline. Hearing intact. Voice is normal. Shoulder shrug is normal. The tongue has normal motion without fasciculations.   Coordination:    Normal finger to nose and heel to shin. Normal rapid alternating movements.   Gait:    Heel-toe and tandem gait are normal.   Motor Observation:    No asymmetry, no atrophy, and no involuntary movements noted. Tone:    Normal muscle tone.    Posture:    Posture is normal. normal erect    Strength:    Strength is V/V in the upper and lower limbs.      Sensation: intact to LT     Reflex Exam:  DTR's:    Deep tendon reflexes in the upper and lower extremities are normal bilaterally.   Toes:    The toes are downgoing bilaterally.   Clonus:    Clonus is absent.       Assessment/Plan:  56 year old with hand and leg weakness, paresthesias, headache, falls.MRIof the brain with non-specific white matter changes needs further eval LP for MS. All other extensive lab testing were normal. And MRI  cervical spine.      Sarina Ill, MD  [facility] [address] [place], [place] [address]  Phone [phone] Fax [phone]  A total of 35 minutes was spent face-to-face with this patient. Over half this time was spent on counseling patient on the abnormal white matter changes diagnosis and different diagnostic and therapeutic  options available.

## 2016-12-03 NOTE — Addendum Note (Signed)
Addended by: Sarina Ill B on: 12/03/2016 09:45 AM   Modules accepted: Orders

## 2016-12-05 ENCOUNTER — Telehealth: Payer: Self-pay

## 2016-12-05 NOTE — Telephone Encounter (Signed)
-----   Message from Melvenia Beam, MD sent at 12/04/2016  5:51 PM EST ----- MRI cervical spine showed no cord lesions (great news) some mild arthritic changes and a possible a pinched nerve on the left at one level which may be causing some of her left arm symptoms. We could do steroid injections around the pinched nerve to see if helps with the left arm sensory changes thanks

## 2016-12-05 NOTE — Telephone Encounter (Signed)
Reviewed cervical MRI results w/ patient. She would like to think about having ESI performed for the pinched nerve. Has not completely made her mind up about having LP (d/t brain lesions) either. Says that she's terrified of needles, has never had anything done to her back and is afraid of possible risks of procedure. C/o numbness/tingling in her leg keeping her awake at night. Asks if there's a medication that she can take at bedtime for symptoms. She would also like to consider taking an oral steroid before undergoing ESI. Answered multiple questions but pt remains very anxious and asks if Dr. Jaynee Eagles could also give her a call today if/when she has time.

## 2016-12-07 NOTE — Telephone Encounter (Signed)
I called patient left her a message will try back again tomorrow. Yes please order gabapentin 300mg  three times a day but she can just take it at night to start. Plesae discuss side effects with her including dizziness, drowsiness, weakness, tired feeling, nausea, diarrhea, constipation, blurred vision, headache, breast swelling, dry mouth, loss of balance or coordination.

## 2016-12-08 MED ORDER — GABAPENTIN 300 MG PO CAPS: 300.0000 mg | ORAL_CAPSULE | Freq: Three times a day (TID) | ORAL | 11 refills | Status: DC

## 2016-12-08 NOTE — Telephone Encounter (Signed)
Gabapentin rx e-scribed per MD order.

## 2016-12-08 NOTE — Addendum Note (Signed)
Addended by: Monte Fantasia on: 12/08/2016 09:21 PM   Modules accepted: Orders

## 2016-12-08 NOTE — Addendum Note (Signed)
Addended by: Monte Fantasia on: 12/08/2016 09:15 PM   Modules accepted: Orders

## 2016-12-09 ENCOUNTER — Other Ambulatory Visit: Payer: Self-pay | Admitting: Neurology

## 2016-12-09 NOTE — Telephone Encounter (Signed)
Discussed with patient, sent her gabapentin discussed side effects such as dizziness, drowsiness, weakness, tired feeling, nausea, diarrhea, constipation, blurred vision, headache, breast swelling, dry mouth, loss of balance or coordination.

## 2016-12-09 NOTE — Telephone Encounter (Signed)
Called patient 3 consecutive days and left messages at work and home every day. Asked her to call back if she still would like to talk. thanks

## 2016-12-09 NOTE — Telephone Encounter (Signed)
Pt's daughter called says she rec'd VM from Dr Jaynee Eagles but she is not a pt. Her phone number was listed as the pt's work number. Daughter is not listed as a contact so I deleted it from the pt's chart, she agreed also. She is going to call her mother and let her know to call Dr A.

## 2016-12-15 ENCOUNTER — Ambulatory Visit
Admission: RE | Admit: 2016-12-15 | Discharge: 2016-12-15 | Disposition: A | Discharge status: Home / Self Care | Payer: BC Managed Care – PPO | Source: Ambulatory Visit | Attending: Neurology | Admitting: Neurology

## 2016-12-15 DIAGNOSIS — G35 Multiple sclerosis: Secondary | ICD-10-CM

## 2016-12-15 LAB — CSF CELL COUNT WITH DIFFERENTIAL
RBC Count, CSF: 0 cells/uL (ref 0–10)
WBC, CSF: 1 cells/uL (ref 0–5)

## 2016-12-15 LAB — PROTEIN, CSF: TOTAL PROTEIN, CSF: 39 mg/dL (ref 15–45)

## 2016-12-15 LAB — GLUCOSE, CSF: GLUCOSE CSF: 58 mg/dL (ref 43–76)

## 2016-12-15 MED ORDER — DIAZEPAM 5 MG PO TABS
5.0000 mg | ORAL_TABLET | Freq: Once | ORAL | Status: AC
  Administered 2016-12-15: 5 mg via ORAL

## 2016-12-15 NOTE — Progress Notes (Signed)
1 SST tube drawn from right AC. Site is unremarkable and pt tolerated procedure well. 

## 2016-12-15 NOTE — Discharge Instructions (Signed)

## 2016-12-16 ENCOUNTER — Telehealth: Payer: Self-pay

## 2016-12-16 NOTE — Telephone Encounter (Signed)
Received fax from [facility] lab w/ CSF results:  CELL COUNT AND DIFF, CSF              Color                  Colorless             Appearance       Clear             RBC                   0 cells/uL             WBC                  1 cells/uL             Glucose            58 mg/dL             Protein              39 mg/dL       ACE, oligoclonal bands, VDRL and IgG syn are pending.

## 2016-12-17 LAB — ANGIOTENSIN CONVERTING ENZYME, CSF: ACE, CSF: 7 U/L (ref <=15)

## 2016-12-17 LAB — VDRL, CSF: SYPHILIS VDRL QUANT CSF: NONREACTIVE

## 2016-12-18 ENCOUNTER — Telehealth: Payer: Self-pay | Admitting: Neurology

## 2016-12-18 NOTE — Telephone Encounter (Signed)
Patient calling to get LP results.

## 2016-12-19 NOTE — Telephone Encounter (Signed)
Patient called office in reference to LP results, I advised her of Dr. Cathren Laine message patient understood.  Per patient if we are to call her back she gives [place] permission to leave a very detailed message on her voice mail.

## 2016-12-19 NOTE — Telephone Encounter (Signed)
So far they look good unfortunately they are not all back. We will call her as soon as they all result.

## 2016-12-22 LAB — CNS IGG SYNTHESIS RATE, CSF+BLOOD
ALBUMIN, SERUM(NEPH): 3.7 g/dL (ref 3.5–4.9)
Albumin, CSF: 22.2 mg/dL (ref 8.0–42.0)
IGG INDEX, CSF: 0.45 (ref <0.66)
IGG, SERUM: 1160 mg/dL (ref 694–1618)
IgG, CSF: 3.1 mg/dL (ref 0.8–7.7)
MS CNS IGG SYNTHESIS RATE: -4.3 mg/(24.h) (ref <=3.3)

## 2016-12-23 ENCOUNTER — Telehealth: Payer: Self-pay | Admitting: *Deleted

## 2016-12-23 LAB — OLIGOCLONAL BANDS, CSF + SERM

## 2016-12-23 NOTE — Telephone Encounter (Signed)
-----   Message from Melvenia Beam, MD sent at 12/23/2016  9:53 AM EDT ----- CSF was normal, no indication of multiple sclerosis. We should repeat the MRI in 6 months to a year, have patient schedule a follow up with me around that time thanks!

## 2016-12-23 NOTE — Telephone Encounter (Signed)
Called and spoke with pt about results per AA,MD note. Scheduled f/u for 06/24/17 at 830am, check in 8:00am. Pt verbalized understanding.

## 2017-01-23 ENCOUNTER — Telehealth: Payer: Self-pay | Admitting: Neurology

## 2017-01-23 NOTE — Telephone Encounter (Signed)
LVM informing patient that Dr Jaynee Eagles did not do a A1C lab on her. Suggested it may have been her PCP.

## 2017-01-23 NOTE — Telephone Encounter (Signed)
Patient called office requesting A1C numbers.  Please call

## 2017-03-30 ENCOUNTER — Telehealth: Payer: Self-pay | Admitting: Neurology

## 2017-03-30 NOTE — Telephone Encounter (Signed)
Off her one tomorrow or wednesdy thanks

## 2017-03-30 NOTE — Telephone Encounter (Signed)
Patient called in wanting a sooner appointment I offered her the June 20 this Wednesday. She said she wants a sooner appointment than that because she is in so much pain. . She said she can come in and wait. She said she can see anyone here doesn't have to be Dr. Jaynee Hayden. The best number to contact the patient is [phone]

## 2017-03-30 NOTE — Telephone Encounter (Signed)
Returned pt TC. Reports worsening numbness in upper extremities. Notes increased burning/tingling and having trouble dropping things. Tried 300 mg gabapentin but it made her too groggy. Appt scheduled tomorrow am to discuss further.

## 2017-03-31 ENCOUNTER — Encounter: Payer: Self-pay | Admitting: Neurology

## 2017-03-31 ENCOUNTER — Ambulatory Visit (INDEPENDENT_AMBULATORY_CARE_PROVIDER_SITE_OTHER): Payer: BC Managed Care – PPO | Admitting: Neurology

## 2017-03-31 DIAGNOSIS — R2990 Unspecified symptoms and signs involving the nervous system: Secondary | ICD-10-CM | POA: Insufficient documentation

## 2017-03-31 MED ORDER — GABAPENTIN 100 MG PO CAPS: 100.0000 mg | ORAL_CAPSULE | Freq: Three times a day (TID) | ORAL | 11 refills | Status: DC

## 2017-03-31 NOTE — Addendum Note (Signed)
Addended by: Sarina Ill B on: 03/31/2017 06:07 PM   Modules accepted: Orders

## 2017-03-31 NOTE — Patient Instructions (Addendum)
Remember to drink plenty of fluid, eat healthy meals and do not skip any meals. Try to eat protein with a every meal and eat a healthy snack such as fruit or nuts in between meals. Try to keep a regular sleep-wake schedule and try to exercise daily, particularly in the form of walking, 20-30 minutes a day, if you can.   As far as diagnostic testing:    Our phone number is [phone]. We also have an after hours call service for urgent matters and there is a physician on-call for urgent questions. For any emergencies you know to call 911 or go to the nearest emergency room  Gabapentin capsules or tablets What is this medicine? GABAPENTIN (GA ba pen tin) is used to control partial seizures in adults with epilepsy. It is also used to treat certain types of nerve pain. This medicine may be used for other purposes; ask your health care provider or pharmacist if you have questions. COMMON BRAND NAME(S): Active-PAC with Gabapentin, Gabarone, Neurontin What should I tell my health care provider before I take this medicine? They need to know if you have any of these conditions: -kidney disease -suicidal thoughts, plans, or attempt; a previous suicide attempt by you or a family member -an unusual or allergic reaction to gabapentin, other medicines, foods, dyes, or preservatives -pregnant or trying to get pregnant -breast-feeding How should I use this medicine? Take this medicine by mouth with a glass of water. Follow the directions on the prescription label. You can take it with or without food. If it upsets your stomach, take it with food.Take your medicine at regular intervals. Do not take it more often than directed. Do not stop taking except on your doctor's advice. If you are directed to break the 600 or 800 mg tablets in half as part of your dose, the extra half tablet should be used for the next dose. If you have not used the extra half tablet within 28 days, it should be thrown away. A special  MedGuide will be given to you by the pharmacist with each prescription and refill. Be sure to read this information carefully each time. Talk to your pediatrician regarding the use of this medicine in children. Special care may be needed. Overdosage: If you think you have taken too much of this medicine contact a poison control center or emergency room at once. NOTE: This medicine is only for you. Do not share this medicine with others. What if I miss a dose? If you miss a dose, take it as soon as you can. If it is almost time for your next dose, take only that dose. Do not take double or extra doses. What may interact with this medicine? Do not take this medicine with any of the following medications: -other gabapentin products This medicine may also interact with the following medications: -alcohol -antacids -antihistamines for allergy, cough and cold -certain medicines for anxiety or sleep -certain medicines for depression or psychotic disturbances -homatropine; hydrocodone -naproxen -narcotic medicines (opiates) for pain -phenothiazines like chlorpromazine, mesoridazine, prochlorperazine, thioridazine This list may not describe all possible interactions. Give your health care provider a list of all the medicines, herbs, non-prescription drugs, or dietary supplements you use. Also tell them if you smoke, drink alcohol, or use illegal drugs. Some items may interact with your medicine. What should I watch for while using this medicine? Visit your doctor or health care professional for regular checks on your progress. You may want to keep a record at  home of how you feel your condition is responding to treatment. You may want to share this information with your doctor or health care professional at each visit. You should contact your doctor or health care professional if your seizures get worse or if you have any new types of seizures. Do not stop taking this medicine or any of your seizure  medicines unless instructed by your doctor or health care professional. Stopping your medicine suddenly can increase your seizures or their severity. Wear a medical identification bracelet or chain if you are taking this medicine for seizures, and carry a card that lists all your medications. You may get drowsy, dizzy, or have blurred vision. Do not drive, use machinery, or do anything that needs mental alertness until you know how this medicine affects you. To reduce dizzy or fainting spells, do not sit or stand up quickly, especially if you are an older patient. Alcohol can increase drowsiness and dizziness. Avoid alcoholic drinks. Your mouth may get dry. Chewing sugarless gum or sucking hard candy, and drinking plenty of water will help. The use of this medicine may increase the chance of suicidal thoughts or actions. Pay special attention to how you are responding while on this medicine. Any worsening of mood, or thoughts of suicide or dying should be reported to your health care professional right away. Women who become pregnant while using this medicine may enroll in the [place] Pregnancy Registry by calling [id number]. This registry collects information about the safety of antiepileptic drug use during pregnancy. What side effects may I notice from receiving this medicine? Side effects that you should report to your doctor or health care professional as soon as possible: -allergic reactions like skin rash, itching or hives, swelling of the face, lips, or tongue -worsening of mood, thoughts or actions of suicide or dying Side effects that usually do not require medical attention (report to your doctor or health care professional if they continue or are bothersome): -constipation -difficulty walking or controlling muscle movements -dizziness -nausea -slurred speech -tiredness -tremors -weight gain This list may not describe all possible side effects. Call your  doctor for medical advice about side effects. You may report side effects to FDA at 1-800-FDA-1088. Where should I keep my medicine? Keep out of reach of children. This medicine may cause accidental overdose and death if it taken by other adults, children, or pets. Mix any unused medicine with a substance like cat litter or coffee grounds. Then throw the medicine away in a sealed container like a sealed bag or a coffee can with a lid. Do not use the medicine after the expiration date. Store at room temperature between 15 and 30 degrees C (59 and 86 degrees F). NOTE: This sheet is a summary. It may not cover all possible information. If you have questions about this medicine, talk to your doctor, pharmacist, or health care provider.  2018 Elsevier/Gold Standard (2013-11-25 15:26:50)

## 2017-03-31 NOTE — Progress Notes (Signed)
[facility]   Provider: Dr Jaynee Eagles Referring Provider: Shanon Rosser, PA-Cin same office as Imelda Pillow, NP Primary Care Physician: Imelda Pillow, NP, long, [place]  CC: Hand Numbness  Interval history 03/31/2017: This is a patient with a past medical history of migraines, hypertension. We then following patient for multiple neurologic symptoms including hand and arm numbness, right foot and right leg. These he is, restless legs, numbness and tingling in both legs, neck pain, imbalance, difficulty walking, difficulty speaking and mispronouncing words. She returns today endorsing the above symptoms and also dropping things out of both hands, blurred vision, feeling poorly. Extensive workup has been negative including:  - MRI of the brain showed white matter abnormality but workup for multiple sclerosis negative including lumbar puncture and CSF angiotensin-converting enzyme, IgG, oligoclonal bands, VDRL, protein, glucose, cell count and differential. MRI of the cervical spine was largely unremarkable except for possible C6-C7 nerve root impingement. Extensive lab testing including LP, ferritin, TSH, BMP, vitamin B1, Lyme, multiple myeloma, B6, heavy metals, rheumatoid factor, hepatitis C, RPR, vasculitis workup, Sjogren's syndrome, ANA, sedimentation rate, HIV, angiotensin-converting enzyme all normal. MRI of the cervical spine showed some degenerative changes but normal cord. She did have severe left foraminal stenosis at C6-C7. Nerve conduction study with EMG was ordered but patient did not have a completed. Patient started noticing symptoms in her left hand and her arm went numb in 2-3 days later she also noticed her right foot fell asleep. Also complaining of headaches, restless legs, numbness in the left side in the arm and occasionally on the right, pain and muscle tightness. She is dropping things out of both hands especially the right. Both arms are numb. She  declines EMG/ncs. Heat makes it worse. She has imbalance. Her vision is blurred. No migraines or headaches. She is "miserable".  The heat bothers her significantly. She doesn't feel like herself. SHE DECLINES EMG/NCS. She is crying in the office today. Recommend second opinion from a neurologist at [facility]. Her concentration is poor. She saw an eye doctor and has 20/20 vision in one eye and 20/25 in another eye. Now she has more tingling in her legs, the entire legs but not in the feet. No alcohol or caffeine. Feels like her legs have been in a marathon. She has changes in urination, incontinence.      MRI cervical spine:  12/03/2016  FINDINGS:  On sagittal views the vertebral bodies have normal height and alignment. Straightening of normal cervical curvature. Disc bulging and spondylosis from C45 down to C6-7.   The spinal cord is normal in size and appearance. The posterior fossa, pituitary gland and paraspinal soft tissues are unremarkable.    On axial views: C2-3: no spinal stenosis or foraminal narrowing  C3-4: no spinal stenosis or foraminal narrowing  C4-5: mild disc bulging with no spinal stenosis or foraminal narrowing C5-6: disc bulging with mild spinal stenosis and mild left foraminal stenosis  C6-7: disc bulging with mild spinal stenosis and severe left foraminal stenosis  C7-T1: no spinal stenosis or foraminal narrowing   Limited views of the soft tissues of the head and neck are unremarkable.  No abnormal enhancing lesions.   IMPRESSION:  Abnormal MRI cervical spine (with and without) demonstrating: 1. At C6-7: disc bulging with mild spinal stenosis and severe left foraminal stenosis  2. At C5-6: disc bulging with mild spinal stenosis and mild left foraminal stenosis  3. No intrinsic, compressive or abnormal enhancing spinal cord lesions.  MRI brain: FINDINGS:  11/26/2016 The brain parenchyma shows scattered tiny periventricular and subcortical as well as  larger peri atrial white matter hyperintensities which are non-specific and may be seen in variety of conditions including small vessel disease, vasculitis, demyelinating, autoimmune or infectious and inflammatory conditions. None of these show any post contrast enhancement. No other structural lesion, tumor or infarcts are noted. No abnormal lesions are seen on diffusion-weighted views to suggest acute ischemia. The cortical sulci, fissures and cisterns are normal in size and appearance. Lateral, third and fourth ventricle are normal in size and appearance. No extra-axial fluid collections are seen. No evidence of mass effect or midline shift. No abnormal lesions are seen on post contrast views. On sagittal views the posterior fossa, pituitary gland and corpus callosum are unremarkable. No evidence of intracranial hemorrhage on gradient-echo views. The orbits and their contents, paranasal sinuses and calvarium are unremarkable. Intracranial flow voids are present. She is having difficulty walking, mispronouncing words.  IMPRESSION:Abnormal MRI scan of the brain showing scattered tiny periventricular and subcortical as well as larger peri atrial white matter hyperintensities which are non-specific with the differential discussed above. No enhancing lesions are noted.  TDD:UKGURK C Smithis a 56 y.o.femalehere as a referral from Dr. Beverley Fiedler hand numbness. PMHx of remote history of migraines as a teen, HTN, neck pain and strain of neck muscle. She started noticing symptoms 4 weeks ago. She noticed it in her left hand first and her hand and her arm went numb like it is falling asleep. 2-3 days later she noticed her right foot and her leg fell asleep wasn't positional. A week later started becoming more frequent and continuous, she also has a headache on the left side of her head it feels like an aching pain radiating from the temple to the back of the head. She is experiencing restless legs as well.  Now the numbness is mostly on the left side in the arm and occ in the right, feels like her hand and arm is waking up pins and needles, it is sporadic and happens frequently throughout the day. Also happen in her legs below the knee the whole leg (in no dermatomal pattern) and she also has the headache which happens at the same time. Symptoms can last for 2-3 minutes and is worsening. The numbness can be severe and disturbing, uncomfortable.The headache is painful can be a 10/10. Alleve helps the headache. Unknown triggers, no pattern can be walking, sitting, sleeping any time of day. She has weakness and sometimes when she is walking she fell. She has neck pain and muscle tightness, no joint pain, no rashes, vision changes, she feels she is having difficulty walking and she is having difficulty speaking and mispronouncing words. No new medications, no exposure to toxins, no inciting events. No other focal neurologic deficits, associated symptoms, inciting events or modifiable factors.   Reviewed notes, labs and imaging from outside physicians, which showed: Reviewed primary care notes. Patient reports numbness but no loss of consciousness, no weakness, no seizures, no dizziness and no headaches. No fevers, no night sweats, no significant weight gain or weight loss. No dry eyes, no irritation no vision changes. Over the past 1-2 weeks patient was complaining of bilateral upper extremity and lower extremity distal paresthetic pains. Mild but persistent for more than 1 minute area of the sensations radiating approximately at times. Position changes don't seem to help. No previous history of trauma or neurologic disease. She denies weakness in the hands or dropping things.  Does report a gait at times right hip feels weak like it's going to give out over the past week. Reviewed exam which was unremarkable including psychiatric, head, eyes, ears nose mouth and throat, neck, lungs, cardiovascular, abdomen,  musculoskeletal, neurologic, skin, back, constitutional, mental status, cranial nerves, spine. She was diagnosed with numbness of hand, bilateral upper extremity sporadic intermittent insidious onset with a stable quality moderate severity duration of 1 week with associated pins and needles. Vitamin B12 and folate were ordered, CBC, CMP and hemoglobin A1c were all ordered. These were ordered 11/03/2016.  BMP and CMP were unremarkable with creatinine 0.97 and BUN 12. TSH normal 1.446. However these labs were back in 2015. We'll request newer labresults to compareas above. Review of Systems: Patient complains of symptoms per HPI as well as the following symptoms: snoring, anxiety. Pertinent negatives per HPI. All others negative.    Social History   Social History  . Marital status: Single    Spouse name: N/A  . Number of children: 2  . Years of education: N/A   Occupational History  . teacher    Social History Main Topics  . Smoking status: Never Smoker  . Smokeless tobacco: Never Used  . Alcohol use No  . Drug use: No  . Sexual activity: Yes    Birth control/ protection: Surgical   Other Topics Concern  . Not on file   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 w/ her family   Right-handed   Caffeine: 1/2 cup coffee during the school year, tea    Family History  Problem Relation Age of Onset  . Heart attack Mother 72  . Heart disease Mother   . Heart attack Maternal Grandmother        age 29 or 19  . Heart disease Maternal Grandmother   . Neuropathy Neg Hx     Past Medical History:  Diagnosis Date  . Fatigue   . GERD (gastroesophageal reflux disease)    w nonobstructing esophageal stricture  . Headache   . Heat intolerance   . History of ETT 6/10   myoview EF 65%, normal wall motion, no ischemia or infarction, poor exercise capacity  . HTN (hypertension)   . Normal echocardiogram 6/10   EF 17-51% moderate diastolic dysfunction, mild LAE  .  Numbness and tingling   . Obesity   . S/P partial hysterectomy     Past Surgical History:  Procedure Laterality Date  . FOOT SURGERY    . hysterectomy (other)      Current Outpatient Prescriptions  Medication Sig Dispense Refill  . aspirin (ASPIR-81) 81 MG EC tablet Take 81 mg by mouth daily.      . carvedilol (COREG) 6.25 MG tablet TAKE 1 TABLET BY MOUTH TWICE DAILY 180 tablet 1  . lisinopril-hydrochlorothiazide (PRINZIDE,ZESTORETIC) 20-12.5 MG per tablet TAKE 2 TABLETS BY MOUTH DAILY 180 tablet 1  . gabapentin (NEURONTIN) 100 MG capsule Take 1 capsule (100 mg total) by mouth 3 (three) times daily. 90 capsule 11  . ibuprofen (ADVIL,MOTRIN) 600 MG tablet Take 1 tablet (600 mg total) by mouth every 6 (six) hours as needed. (Patient not taking: Reported on 03/31/2017) 30 tablet 0   No current facility-administered medications for this visit.    Facility-Administered Medications Ordered in Other Visits  Medication Dose Route Frequency Provider Last Rate Last Dose  . gadopentetate dimeglumine (MAGNEVIST) injection 20 mL  20 mL Intravenous Once PRN Melvenia Beam, MD      .  gadopentetate dimeglumine (MAGNEVIST) injection 20 mL  20 mL Intravenous Once PRN Melvenia Beam, MD        Allergies as of 03/31/2017  . (No Known Allergies)    Vitals: BP (!) 149/99   Pulse 85   Ht '6\' 2"'$  (1.88 m)   Wt 277 lb 9.6 oz (125.9 kg)   BMI 35.64 kg/m  Last Weight:  Wt Readings from Last 1 Encounters:  03/31/17 277 lb 9.6 oz (125.9 kg)   Last Height:   Ht Readings from Last 1 Encounters:  03/31/17 '6\' 2"'$  (1.88 m)   Physical exam: Exam: Gen: NAD, conversant, well nourised, obese, well groomed                     CV: RRR, no MRG. No Carotid Bruits. No peripheral edema, warm, nontender Eyes: Conjunctivae clear without exudates or hemorrhage  Neuro: Detailed Neurologic Exam  Speech:    Speech is normal; fluent and spontaneous with normal comprehension.  Cognition:    The patient is  oriented to person, place, and time;     recent and remote memory intact;     language fluent;     normal attention, concentration,     fund of knowledge Cranial Nerves:    The pupils are equal, round, and reactive to light. The fundi are normal and spontaneous venous pulsations are present. Visual fields are full to finger confrontation. Extraocular movements are intact. Trigeminal sensation is intact and the muscles of mastication are normal. The face is symmetric. The palate elevates in the midline. Hearing intact. Voice is normal. Shoulder shrug is normal. The tongue has normal motion without fasciculations.   Coordination:    Normal finger to nose and heel to shin.   Gait:    Can heel/toe/tandem walk with imbalance  Motor Observation:    No asymmetry, no atrophy, and no involuntary movements noted. Tone:    Normal muscle tone.    Posture:    Posture is normal. normal erect    Strength: Mild bilateral LE proximal weakness otherwise strength is V/V in the upper and lower limbs.      Sensation: intact to LT     Reflex Exam:  DTR's:    Deep tendon reflexes in the upper and lower extremities are normal bilaterally.   Toes:    The toes are downgoing bilaterally.   Clonus:    Clonus is absent.   Assessment/Plan:Interval history 03/31/2017: This is a patient with a past medical history of migraines, hypertension. We then following patient for multiple neurologic symptoms including hand and arm numbness, right foot and right leg. These he is, restless legs, numbness and tingling in both legs, neck pain, imbalance, difficulty walking, difficulty speaking and mispronouncing words. She returns today endorsing the above symptoms and also dropping things out of both hands, blurred vision, feeling poorly, weakness in the legs. Extensive workup has been negative including:  - MRI of the brain showed white matter abnormality but workup for multiple sclerosis negative including lumbar  puncture and CSF angiotensin-converting enzyme, IgG, oligoclonal bands, VDRL, protein, glucose, cell count and differential.  - MRI of the cervical spine was largely unremarkable except for possible C6-C7 nerve root impingement which may explain some left arm symptoms but certainly not all her complaints.  Extensive lab testing including LP, ferritin, TSH, BMP, vitamin B1, Lyme, multiple myeloma, B6, heavy metals, rheumatoid factor, hepatitis C, RPR, vasculitis workup, Sjogren's syndrome, ANA, sedimentation rate, HIV, angiotensin-converting enzyme all normal. -  I have recommended an EMG nerve conduction study but patient declines - I really have no diagnosis that can explain all of patient's symptoms. Patient crying in the office today. She would like to see [facility] for a second opinion and I will refer her. She was advised to bring her imaging on a disc.  Cc: Millsaps, Adele Schilder, MD  [facility] [address] [place], [place] [address]  Phone [phone] Fax [phone]  A total of 40 minutes was spent face-to-face with this patient. Over half this time was spent on counseling patient on the multiple neurologic deficits diagnosis and different diagnostic and therapeutic options available.

## 2017-04-06 ENCOUNTER — Telehealth: Payer: Self-pay | Admitting: Neurology

## 2017-04-06 NOTE — Telephone Encounter (Signed)
Called and spoke to the patient and [facility] is working on a patient a sooner apt . I have also spoke to Nurse reviewer . I have called patient with details as well and she is going to call [name] . Patient and I will be back in touch 04/13/2017.

## 2017-04-06 NOTE — Telephone Encounter (Signed)
Pt would like a call back regarding referrals to [facility] - or [facility]?  she has not heard from anyone. Best call back is [phone]

## 2017-04-07 ENCOUNTER — Ambulatory Visit (INDEPENDENT_AMBULATORY_CARE_PROVIDER_SITE_OTHER): Payer: BC Managed Care – PPO | Admitting: Obstetrics

## 2017-04-07 ENCOUNTER — Encounter: Payer: Self-pay | Admitting: Obstetrics

## 2017-04-07 VITALS — BP 141/99 | HR 81 | Wt 274.0 lb

## 2017-04-07 DIAGNOSIS — Z01419 Encounter for gynecological examination (general) (routine) without abnormal findings: Secondary | ICD-10-CM | POA: Diagnosis not present

## 2017-04-07 DIAGNOSIS — R102 Pelvic and perineal pain: Secondary | ICD-10-CM | POA: Diagnosis not present

## 2017-04-07 DIAGNOSIS — N393 Stress incontinence (female) (male): Secondary | ICD-10-CM

## 2017-04-07 NOTE — Progress Notes (Signed)
Subjective:        [name] is a 56 y.o. female here for a routine exam.  Current complaints: Left sided lower abdominal pain.  Denies constipation / diarrhea, dysuria or gas.  She does leak urine with cough, sneeze and other valsalva maneuvers.  Has been having extremity numbness and weakness, and is being evaluated by Neurology.  Personal health questionnaire:  Is patient [name], have a family history of breast and/or ovarian cancer: no Is there a family history of uterine cancer diagnosed at age < 24, gastrointestinal cancer, urinary tract cancer, family member who is a Field seismologist syndrome-associated carrier: no Is the patient overweight and hypertensive, family history of diabetes, personal history of gestational diabetes, preeclampsia or PCOS: no Is patient over 29, have PCOS,  family history of premature CHD under age 75, diabetes, smoke, have hypertension or peripheral artery disease:  no At any time, has a partner hit, kicked or otherwise hurt or frightened you?: no Over the past 2 weeks, have you felt down, depressed or hopeless?: no Over the past 2 weeks, have you felt little interest or pleasure in doing things?:no   Gynecologic History No LMP recorded. Patient has had a hysterectomy. Contraception: status post hysterectomy Last Pap: 2015. Results were: normal Last mammogram: 2015. Results were: normal  Obstetric History OB History  Gravida Para Term Preterm AB Living  2 2 2     2   SAB TAB Ectopic Multiple Live Births          2    # Outcome Date GA Lbr Len/2nd Weight Sex Delivery Anes PTL Lv  2 Term 04/24/85        LIV  1 Term 12/08/82        LIV      Past Medical History:  Diagnosis Date  . Fatigue   . GERD (gastroesophageal reflux disease)    w nonobstructing esophageal stricture  . Headache   . Heat intolerance   . History of ETT 6/10   myoview EF 65%, normal wall motion, no ischemia or infarction, poor exercise capacity  . HTN  (hypertension)   . Normal echocardiogram 6/10   EF 56-31% moderate diastolic dysfunction, mild LAE  . Numbness and tingling   . Obesity   . S/P partial hysterectomy     Past Surgical History:  Procedure Laterality Date  . FOOT SURGERY    . hysterectomy (other)       Current Outpatient Prescriptions:  .  aspirin (ASPIR-81) 81 MG EC tablet, Take 81 mg by mouth daily.  , Disp: , Rfl:  .  carvedilol (COREG) 6.25 MG tablet, TAKE 1 TABLET BY MOUTH TWICE DAILY, Disp: 180 tablet, Rfl: 1 .  gabapentin (NEURONTIN) 100 MG capsule, Take 1 capsule (100 mg total) by mouth 3 (three) times daily., Disp: 90 capsule, Rfl: 11 .  lisinopril-hydrochlorothiazide (PRINZIDE,ZESTORETIC) 20-12.5 MG per tablet, TAKE 2 TABLETS BY MOUTH DAILY, Disp: 180 tablet, Rfl: 1 .  ibuprofen (ADVIL,MOTRIN) 600 MG tablet, Take 1 tablet (600 mg total) by mouth every 6 (six) hours as needed. (Patient not taking: Reported on 03/31/2017), Disp: 30 tablet, Rfl: 0 No current facility-administered medications for this visit.   Facility-Administered Medications Ordered in Other Visits:  .  gadopentetate dimeglumine (MAGNEVIST) injection 20 mL, 20 mL, Intravenous, Once PRN, Sarina Ill B, MD .  gadopentetate dimeglumine (MAGNEVIST) injection 20 mL, 20 mL, Intravenous, Once PRN, Melvenia Beam, MD No Known Allergies  Social History  Substance Use  Topics  . Smoking status: Never Smoker  . Smokeless tobacco: Never Used  . Alcohol use No    Family History  Problem Relation Age of Onset  . Heart attack Mother 67  . Heart disease Mother   . Heart attack Maternal Grandmother        age 61 or 81  . Heart disease Maternal Grandmother   . Neuropathy Neg Hx       Review of Systems  Constitutional: negative for fatigue and weight loss Respiratory: negative for cough and wheezing Cardiovascular: negative for chest pain, fatigue and palpitations Gastrointestinal: positive for lower abdominal pain, and negative for change in  bowel habits Musculoskeletal:positive for myalgias Neurological: positive for gait problems and numbness and weakness of extremities  Behavioral/Psych: negative for abusive relationship, depression Endocrine: negative for temperature intolerance    Genitourinary:positive for pelvic pain, and negative for genital lesions, hot flashes, sexual problems and vaginal discharge Integument/breast: negative for breast lump, breast tenderness, nipple discharge and skin lesion(s)    Objective:       BP (!) 141/99   Pulse 81   Wt 274 lb (124.3 kg)   BMI 35.18 kg/m  General:   alert  Skin:   no rash or abnormalities  Lungs:   clear to auscultation bilaterally  Heart:   regular rate and rhythm, S1, S2 normal, no murmur, click, rub or gallop  Breasts:   normal without suspicious masses, skin or nipple changes or axillary nodes  Abdomen:  normal findings: no organomegaly, soft, non-tender and no hernia  Pelvis:  External genitalia: normal general appearance Urinary system: urethral meatus normal and bladder without fullness, nontender Vaginal: normal without tenderness, induration or masses Cervix: absent Adnexa: tenderness, left adnexa, no masses or fullness Uterus: absent   Lab Review Urine pregnancy test Labs reviewed yes Radiologic studies reviewed yes  50% of 20 min visit spent on counseling and coordination of care.    Assessment:     1. Pelvic pain Rx: - Cervicovaginal ancillary only - US Pelvis Complete; Future - US Transvaginal Non-OB; Future  2. SUI (stress urinary incontinence, female) Rx: - Ambulatory referral to Urogynecology  3. Encounter for gynecological examination    Plan:    Education reviewed: calcium supplements, depression evaluation, low fat, low cholesterol diet, self breast exams and weight bearing exercise. Follow up in: 1 year.   No orders of the defined types were placed in this encounter.  Orders Placed This Encounter  Procedures  . US Pelvis  Complete    Standing Status:   Future    Standing Expiration Date:   06/07/2018    Order Specific Question:   Reason for Exam (SYMPTOM  OR DIAGNOSIS REQUIRED)    Answer:   Pelvic pain    Order Specific Question:   Preferred imaging location?    Answer:   [facility]  . US Transvaginal Non-OB    Standing Status:   Future    Standing Expiration Date:   06/07/2018    Order Specific Question:   Reason for Exam (SYMPTOM  OR DIAGNOSIS REQUIRED)    Answer:   pelvic pain    Order Specific Question:   Preferred imaging location?    Answer:   [facility]  . Ambulatory referral to Urogynecology    Referral Priority:   Routine    Referral Type:   Consultation    Referral Reason:   Specialty Services Required    Requested Specialty:   Urology    Number  of Visits Requested:   1     Patient ID: Emika Tiano, female   DOB: 1961-03-20, 56 y.o.   MRN: [id number]

## 2017-04-08 LAB — CERVICOVAGINAL ANCILLARY ONLY
BACTERIAL VAGINITIS: NEGATIVE
Candida vaginitis: NEGATIVE

## 2017-04-14 ENCOUNTER — Telehealth: Payer: Self-pay | Admitting: Neurology

## 2017-04-14 ENCOUNTER — Ambulatory Visit (HOSPITAL_COMMUNITY)
Admission: RE | Admit: 2017-04-14 | Discharge: 2017-04-14 | Disposition: A | Discharge status: Home / Self Care | Payer: BC Managed Care – PPO | Source: Ambulatory Visit | Attending: Obstetrics | Admitting: Obstetrics

## 2017-04-14 DIAGNOSIS — R102 Pelvic and perineal pain: Secondary | ICD-10-CM | POA: Diagnosis present

## 2017-04-14 DIAGNOSIS — Z90710 Acquired absence of both cervix and uterus: Secondary | ICD-10-CM | POA: Insufficient documentation

## 2017-04-14 NOTE — Telephone Encounter (Signed)
Pt states that over a week ago she submitted paperwork for medical leave of absence and is calling for an update on the status of it, please call

## 2017-04-14 NOTE — Telephone Encounter (Signed)
Paperwork received 04/06/17 and is in process. Dr. Jaynee Eagles is currently out of the office and per office policy, it can take up to 2 weeks for form completion.

## 2017-04-16 NOTE — Telephone Encounter (Signed)
I did not agree to give patient medical leave of absence, I have no reason for her symptoms and workup has been negative. She was ambulatory at last appointment with a normal neuro exam. I can;t fill this out, thanks

## 2017-04-16 NOTE — Telephone Encounter (Signed)
Pt with hx of migraines and hypertension was last seen 03/31/17 w/ multiple neurologic symptoms (hand/arm numbness, restlessness/numbness/tingling/weakness in both legs, neck pain, imbalance/difficulty walking, difficulty speaking/mispronouncing words, dropping things, blurred vision, feeling poorly). Extensive workup has been negative. She has been referred to [facility] for 2nd opinion. Appears that appt was scheduled for April 2019.

## 2017-04-20 NOTE — Telephone Encounter (Signed)
Pt called again about paperwork for medical leave. Spoke to pt and explained to her that all of her neurological evaluation has so far come back negative which is one of the reasons that she was referred to [facility] for a 2nd opinion. She has an appt there on 04/23/17. Said that she's unable to work currently because of the medications that she's been prescribed. Reports that although she still drops things and can't carry anything heavy, the gabapentin 300 mg helps a lot w/ the numbness in her legs. However, she can only tolerate the 100 mg which doesn't control her symptoms as well. Also says that she is more heat sensitive when taking them. Asked what other meds she is taking as the gabapentin is the only med prescribed by our office. States that she also takes naproxen and tramadol, both prescribed by her PCP, for pain/discomfort. Let her know again that paperwork would not be completed by Dr. Jaynee Eagles d/t negative workup. She declined NCV/EMG so she should check w/ her PCP and follow-up as scheduled for 2nd neuro opinion. Verbalized understanding and requested that paperwork be mailed back to her.

## 2017-04-21 ENCOUNTER — Telehealth: Payer: Self-pay | Admitting: *Deleted

## 2017-04-21 NOTE — Telephone Encounter (Signed)
Pt form mailed out on 04/21/17.

## 2017-04-28 ENCOUNTER — Ambulatory Visit: Payer: BC Managed Care – PPO | Admitting: Obstetrics

## 2017-04-30 ENCOUNTER — Telehealth: Payer: Self-pay | Admitting: *Deleted

## 2017-04-30 DIAGNOSIS — R200 Anesthesia of skin: Secondary | ICD-10-CM | POA: Insufficient documentation

## 2017-04-30 NOTE — Telephone Encounter (Signed)
I left patient a message. She asked for a referral to [facility] because my extensive workup did not reveal the cause of her many symptoms. I have recommended an emg/ncs but she has declined in the past please ask her if she is willing to come for an emg/ncs and we can set her up for that and I can talk toher at that time. I will ask Dr. Felecia Shelling if he feels he has anything more to offer patient and if he wants to see her. thanks

## 2017-04-30 NOTE — Telephone Encounter (Signed)
Pt had MRIs (brain and cervical) in Feb, LP in March. She declined NCV/EMG. Dr. Lavone Neri [facility]) ordered a repeat MRI brain w/wo. Pt has a f/u scheduled w/ Dr. Jaynee Eagles in Sept.

## 2017-05-01 NOTE — Telephone Encounter (Signed)
With the non-specific MRI and normal CSF, I don't think I can offer anything special.

## 2017-05-04 ENCOUNTER — Other Ambulatory Visit: Payer: Self-pay | Admitting: Neurology

## 2017-05-04 DIAGNOSIS — R29898 Other symptoms and signs involving the musculoskeletal system: Secondary | ICD-10-CM

## 2017-05-04 DIAGNOSIS — R200 Anesthesia of skin: Secondary | ICD-10-CM

## 2017-05-04 MED ORDER — ALPRAZOLAM 0.5 MG PO TABS: ORAL_TABLET | ORAL | 0 refills | Status: DC

## 2017-05-04 NOTE — Telephone Encounter (Signed)
Pt returned Dr Jaynee Eagles call

## 2017-05-04 NOTE — Telephone Encounter (Signed)
Left patient another message, asked her to call us back thanks

## 2017-05-04 NOTE — Telephone Encounter (Signed)
Spoke to patient, she was not happy with the neurologist at [facility]. He suggested dr Felecia Shelling but we have already reviewed all the data and images with Dr. Felecia Shelling and cannot identify any neurologic cause for her symptoms. She says she had acute left leg weakness. Neurologist at [facility] suggested another MRI of the brain especially since new symptoms and will set up for emg/ncs of left leg and bilateral arms.  Delsa Sale, can you ,make her an appt for emg/ncs? Raquel Sarna, can we get her on the MRI bus on Wednesday?

## 2017-05-04 NOTE — Telephone Encounter (Signed)
I printed the xanax script, thanks

## 2017-05-04 NOTE — Telephone Encounter (Signed)
I scheduled patients MRI for this Wednesday 05/06/17 for our [place] mobile unit. She stated that she would like to have her NCV/EMG scheduled for 05/11/17 if possible. She also informed me that she is slightly claustrophic and will need something to calm her nerves.

## 2017-05-06 ENCOUNTER — Ambulatory Visit (INDEPENDENT_AMBULATORY_CARE_PROVIDER_SITE_OTHER): Payer: BC Managed Care – PPO

## 2017-05-06 DIAGNOSIS — R29898 Other symptoms and signs involving the musculoskeletal system: Secondary | ICD-10-CM | POA: Diagnosis not present

## 2017-05-06 DIAGNOSIS — R200 Anesthesia of skin: Secondary | ICD-10-CM | POA: Diagnosis not present

## 2017-05-06 MED ORDER — GADOPENTETATE DIMEGLUMINE 469.01 MG/ML IV SOLN
20.0000 mL | Freq: Once | INTRAVENOUS | Status: AC | PRN
  Administered 2017-05-06: 20 mL via INTRAVENOUS

## 2017-05-06 NOTE — Telephone Encounter (Signed)
Rx signed and faxed to pharmacy

## 2017-05-06 NOTE — Telephone Encounter (Signed)
Scheduled for NCV/EMG next Wed 8/1.

## 2017-05-07 ENCOUNTER — Telehealth: Payer: Self-pay | Admitting: Neurology

## 2017-05-07 NOTE — Telephone Encounter (Signed)
Patient called office in reference to see if MRI results have been completed.  Please call per patient does not answer per patient please leave a detailed voice mail (confidential voice mail).

## 2017-05-08 ENCOUNTER — Telehealth: Payer: Self-pay | Admitting: Neurology

## 2017-05-08 NOTE — Telephone Encounter (Signed)
Pt called back, she is extremely anxious. I read the msg from Dr Jaynee Eagles, she understood and will wait for a call.

## 2017-05-08 NOTE — Telephone Encounter (Signed)
Dr. Felecia Shelling - Will you please read this MRI brain? Patient would prefer you since you were recommended by [facility] doctor. Thanks.

## 2017-05-08 NOTE — Telephone Encounter (Signed)
MRI reviewed. No change. -VRP

## 2017-05-08 NOTE — Telephone Encounter (Signed)
It takes a few days for our neuroradiologists to read it and then I have to review hundreds of images as well, unfortunately we will have to call her next week. If there is anything extremely concerning I will call over the weekend.  thanks

## 2017-05-10 DIAGNOSIS — R296 Repeated falls: Secondary | ICD-10-CM | POA: Insufficient documentation

## 2017-05-10 DIAGNOSIS — H6002 Abscess of left external ear: Secondary | ICD-10-CM | POA: Insufficient documentation

## 2017-05-11 ENCOUNTER — Telehealth: Payer: Self-pay | Admitting: Neurology

## 2017-05-11 NOTE — Telephone Encounter (Signed)
I recommend it and so did the [facility] neurologist but she certainly doesn't have to have it. Let her know Thanks

## 2017-05-11 NOTE — Telephone Encounter (Signed)
Patient called office in reference to NCV/EMG. Patient would like to know if she truly has to have this done she states she is all copayed out and if trying to not have done what doesn't need to be done.  Please call

## 2017-05-12 NOTE — Telephone Encounter (Signed)
Called pt w/ MRI results as below:  Message Contents  Melvenia Beam, MD  Monte Fantasia, RN        Compared to MRI on 11/26/16, no interval change.    Also encouraged pt to proceed w/ EMG/NCV as recommended by both Dr. Jaynee Eagles and [facility] to help determine the cause of her symptoms. May call back w/ questions or to schedule procedure.

## 2017-05-13 ENCOUNTER — Ambulatory Visit (INDEPENDENT_AMBULATORY_CARE_PROVIDER_SITE_OTHER): Payer: Self-pay | Admitting: Neurology

## 2017-05-13 ENCOUNTER — Ambulatory Visit (INDEPENDENT_AMBULATORY_CARE_PROVIDER_SITE_OTHER): Payer: BC Managed Care – PPO | Admitting: Neurology

## 2017-05-13 DIAGNOSIS — M5417 Radiculopathy, lumbosacral region: Secondary | ICD-10-CM | POA: Diagnosis not present

## 2017-05-13 DIAGNOSIS — E538 Deficiency of other specified B group vitamins: Secondary | ICD-10-CM | POA: Diagnosis not present

## 2017-05-13 DIAGNOSIS — R200 Anesthesia of skin: Secondary | ICD-10-CM

## 2017-05-13 DIAGNOSIS — G629 Polyneuropathy, unspecified: Secondary | ICD-10-CM | POA: Diagnosis not present

## 2017-05-13 DIAGNOSIS — G5603 Carpal tunnel syndrome, bilateral upper limbs: Secondary | ICD-10-CM

## 2017-05-13 DIAGNOSIS — R29898 Other symptoms and signs involving the musculoskeletal system: Secondary | ICD-10-CM

## 2017-05-13 DIAGNOSIS — Z0289 Encounter for other administrative examinations: Secondary | ICD-10-CM

## 2017-05-13 NOTE — Progress Notes (Signed)
See procedure note.

## 2017-05-13 NOTE — Patient Instructions (Addendum)
Wear wrist braces.    Neuropathic Pain (peripheral polyneuropathy) Neuropathic pain is pain caused by damage to the nerves that are responsible for certain sensations in your body (sensory nerves). The pain can be caused by damage to:  The sensory nerves that send signals to your spinal cord and brain (peripheral nervous system).  The sensory nerves in your brain or spinal cord (central nervous system).  Neuropathic pain can make you more sensitive to pain. What would be a minor sensation for most people may feel very painful if you have neuropathic pain. This is usually a long-term condition that can be difficult to treat. The type of pain can differ from person to person. It may start suddenly (acute), or it may develop slowly and last for a long time (chronic). Neuropathic pain may come and go as damaged nerves heal or may stay at the same level for years. It often causes emotional distress, loss of sleep, and a lower quality of life. What are the causes? The most common cause of damage to a sensory nerve is diabetes. Many other diseases and conditions can also cause neuropathic pain. Causes of neuropathic pain can be classified as:  Toxic. Many drugs and chemicals can cause toxic damage. The most common cause of toxic neuropathic pain is damage from drug treatment for cancer (chemotherapy).  Metabolic. This type of pain can happen when a disease causes imbalances that damage nerves. Diabetes is the most common of these diseases. Vitamin B deficiency caused by long-term alcohol abuse is another common cause.  Traumatic. Any injury that cuts, crushes, or stretches a nerve can cause damage and pain. A common example is feeling pain after losing an arm or leg (phantom limb pain).  Compression-related. If a sensory nerve gets trapped or compressed for a long period of time, the blood supply to the nerve can be cut off.  Vascular. Many blood vessel diseases can cause neuropathic pain by  decreasing blood supply and oxygen to nerves.  Autoimmune. This type of pain results from diseases in which the body's defense system mistakenly attacks sensory nerves. Examples of autoimmune diseases that can cause neuropathic pain include lupus and multiple sclerosis.  Infectious. Many types of viral infections can damage sensory nerves and cause pain. Shingles infection is a common cause of this type of pain.  Inherited. Neuropathic pain can be a symptom of many diseases that are passed down through families (genetic).  What are the signs or symptoms? The main symptom is pain. Neuropathic pain is often described as:  Burning.  Shock-like.  Stinging.  Hot or cold.  Itching.  How is this diagnosed? No single test can diagnose neuropathic pain. Your health care provider will do a physical exam and ask you about your pain. You may use a pain scale to describe how bad your pain is. You may also have tests to see if you have a high sensitivity to pain and to help find the cause and location of any sensory nerve damage. These tests may include:  Imaging studies, such as: ? X-rays. ? CT scan. ? MRI.  Nerve conduction studies to test how well nerve signals travel through your sensory nerves (electrodiagnostic testing).  Stimulating your sensory nerves through electrodes on your skin and measuring the response in your spinal cord and brain (somatosensory evoked potentials).  How is this treated? Treatment for neuropathic pain may change over time. You may need to try different treatment options or a combination of treatments. Some options include:  Over-the-counter pain relievers.  Prescription medicines. Some medicines used to treat other conditions may also help neuropathic pain. These include medicines to: ? Control seizures (anticonvulsants). ? Relieve depression (antidepressants).  Prescription-strength pain relievers (narcotics). These are usually used when other pain  relievers do not help.  Transcutaneous nerve stimulation (TENS). This uses electrical currents to block painful nerve signals. The treatment is painless.  Topical and local anesthetics. These are medicines that numb the nerves. They can be injected as a nerve block or applied to the skin.  Alternative treatments, such as: ? Acupuncture. ? Meditation. ? Massage. ? Physical therapy. ? Pain management programs. ? Counseling.  Follow these instructions at home:  Learn as much as you can about your condition.  Take medicines only as directed by your health care provider.  Work closely with all your health care providers to find what works best for you.  Have a good support system at home.  Consider joining a chronic pain support group. Contact a health care provider if:  Your pain treatments are not helping.  You are having side effects from your medicines.  You are struggling with fatigue, mood changes, depression, or anxiety. This information is not intended to replace advice given to you by your health care provider. Make sure you discuss any questions you have with your health care provider. Document Released: 06/26/2004 Document Revised: 04/18/2016 Document Reviewed: 03/09/2014 Elsevier Interactive Patient Education  2018 [place] Syndrome Carpal tunnel syndrome is a condition that causes pain in your hand and arm. The carpal tunnel is a narrow area located on the palm side of your wrist. Repeated wrist motion or certain diseases may cause swelling within the tunnel. This swelling pinches the main nerve in the wrist (median nerve). What are the causes? This condition may be caused by:  Repeated wrist motions.  Wrist injuries.  Arthritis.  A cyst or tumor in the carpal tunnel.  Fluid buildup during pregnancy.  Sometimes the cause of this condition is not known. What increases the risk? This condition is more likely to develop in:  People who  have jobs that cause them to repeatedly move their wrists in the same motion, such as Art gallery manager.  Women.  People with certain conditions, such as: ? Diabetes. ? Obesity. ? An underactive thyroid (hypothyroidism). ? Kidney failure.  What are the signs or symptoms? Symptoms of this condition include:  A tingling feeling in your fingers, especially in your thumb, index, and middle fingers.  Tingling or numbness in your hand.  An aching feeling in your entire arm, especially when your wrist and elbow are bent for long periods of time.  Wrist pain that goes up your arm to your shoulder.  Pain that goes down into your palm or fingers.  A weak feeling in your hands. You may have trouble grabbing and holding items.  Your symptoms may feel worse during the night. How is this diagnosed? This condition is diagnosed with a medical history and physical exam. You may also have tests, including:  An electromyogram (EMG). This test measures electrical signals sent by your nerves into the muscles.  X-rays.  How is this treated? Treatment for this condition includes:  Lifestyle changes. It is important to stop doing or modify the activity that caused your condition.  Physical or occupational therapy.  Medicines for pain and inflammation. This may include medicine that is injected into your wrist.  A wrist splint.  Surgery.  Follow these instructions  at home: If you have a splint:  Wear it as told by your health care provider. Remove it only as told by your health care provider.  Loosen the splint if your fingers become numb and tingle, or if they turn cold and blue.  Keep the splint clean and dry. General instructions  Take over-the-counter and prescription medicines only as told by your health care provider.  Rest your wrist from any activity that may be causing your pain. If your condition is work related, talk to your employer about changes that can be made,  such as getting a wrist pad to use while typing.  If directed, apply ice to the painful area: ? Put ice in a plastic bag. ? Place a towel between your skin and the bag. ? Leave the ice on for 20 minutes, 2-3 times per day.  Keep all follow-up visits as told by your health care provider. This is important.  Do any exercises as told by your health care provider, physical therapist, or occupational therapist. Contact a health care provider if:  You have new symptoms.  Your pain is not controlled with medicines.  Your symptoms get worse. This information is not intended to replace advice given to you by your health care provider. Make sure you discuss any questions you have with your health care provider. Document Released: 09/26/2000 Document Revised: 02/07/2016 Document Reviewed: 02/14/2015 Elsevier Interactive Patient Education  2017 [employer]    Sciatica Sciatica is pain, numbness, weakness, or tingling along the path of the sciatic nerve. The sciatic nerve starts in the lower back and runs down the back of each leg. The nerve controls the muscles in the lower leg and in the back of the knee. It also provides feeling (sensation) to the back of the thigh, the lower leg, and the sole of the foot. Sciatica is a symptom of another medical condition that pinches or puts pressure on the sciatic nerve. Generally, sciatica only affects one side of the body. Sciatica usually goes away on its own or with treatment. In some cases, sciatica may keep coming back (recur). What are the causes? This condition is caused by pressure on the sciatic nerve, or pinching of the sciatic nerve. This may be the result of:  A disk in between the bones of the spine (vertebrae) bulging out too far (herniated disk).  Age-related changes in the spinal disks (degenerative disk disease).  A pain disorder that affects a muscle in the buttock (piriformis syndrome).  Extra bone growth (bone spur) near the  sciatic nerve.  An injury or break (fracture) of the pelvis.  Pregnancy.  Tumor (rare).  What increases the risk? The following factors may make you more likely to develop this condition:  Playing sports that place pressure or stress on the spine, such as football or weight lifting.  Having poor strength and flexibility.  A history of back injury.  A history of back surgery.  Sitting for long periods of time.  Doing activities that involve repetitive bending or lifting.  Obesity.  What are the signs or symptoms? Symptoms can vary from mild to very severe, and they may include:  Any of these problems in the lower back, leg, hip, or buttock: ? Mild tingling or dull aches. ? Burning sensations. ? Sharp pains.  Numbness in the back of the calf or the sole of the foot.  Leg weakness.  Severe back pain that makes movement difficult.  These symptoms may get worse when you  cough, sneeze, or laugh, or when you sit or stand for long periods of time. Being overweight may also make symptoms worse. In some cases, symptoms may recur over time. How is this diagnosed? This condition may be diagnosed based on:  Your symptoms.  A physical exam. Your health care provider may ask you to do certain movements to check whether those movements trigger your symptoms.  You may have tests, including: ? Blood tests. ? X-rays. ? MRI. ? CT scan.  How is this treated? In many cases, this condition improves on its own, without any treatment. However, treatment may include:  Reducing or modifying physical activity during periods of pain.  Exercising and stretching to strengthen your abdomen and improve the flexibility of your spine.  Icing and applying heat to the affected area.  Medicines that help: ? To relieve pain and swelling. ? To relax your muscles.  Injections of medicines that help to relieve pain, irritation, and inflammation around the sciatic nerve  (steroids).  Surgery.  Follow these instructions at home: Medicines  Take over-the-counter and prescription medicines only as told by your health care provider.  Do not drive or operate heavy machinery while taking prescription pain medicine. Managing pain  If directed, apply ice to the affected area. ? Put ice in a plastic bag. ? Place a towel between your skin and the bag. ? Leave the ice on for 20 minutes, 2-3 times a day.  After icing, apply heat to the affected area before you exercise or as often as told by your health care provider. Use the heat source that your health care provider recommends, such as a moist heat pack or a heating pad. ? Place a towel between your skin and the heat source. ? Leave the heat on for 20-30 minutes. ? Remove the heat if your skin turns bright red. This is especially important if you are unable to feel pain, heat, or cold. You may have a greater risk of getting burned. Activity  Return to your normal activities as told by your health care provider. Ask your health care provider what activities are safe for you. ? Avoid activities that make your symptoms worse.  Take brief periods of rest throughout the day. Resting in a lying or standing position is usually better than sitting to rest. ? When you rest for longer periods, mix in some mild activity or stretching between periods of rest. This will help to prevent stiffness and pain. ? Avoid sitting for long periods of time without moving. Get up and move around at least one time each hour.  Exercise and stretch regularly, as told by your health care provider.  Do not lift anything that is heavier than 10 lb (4.5 kg) while you have symptoms of sciatica. When you do not have symptoms, you should still avoid heavy lifting, especially repetitive heavy lifting.  When you lift objects, always use proper lifting technique, which includes: ? Bending your knees. ? Keeping the load close to your  body. ? Avoiding twisting. General instructions  Use good posture. ? Avoid leaning forward while sitting. ? Avoid hunching over while standing.  Maintain a healthy weight. Excess weight puts extra stress on your back and makes it difficult to maintain good posture.  Wear supportive, comfortable shoes. Avoid wearing high heels.  Avoid sleeping on a mattress that is too soft or too hard. A mattress that is firm enough to support your back when you sleep may help to reduce your pain.  Keep all follow-up visits as told by your health care provider. This is important. Contact a health care provider if:  You have pain that wakes you up when you are sleeping.  You have pain that gets worse when you lie down.  Your pain is worse than you have experienced in the past.  Your pain lasts longer than 4 weeks.  You experience unexplained weight loss. Get help right away if:  You lose control of your bowel or bladder (incontinence).  You have: ? Weakness in your lower back, pelvis, buttocks, or legs that gets worse. ? Redness or swelling of your back. ? A burning sensation when you urinate. This information is not intended to replace advice given to you by your health care provider. Make sure you discuss any questions you have with your health care provider. Document Released: 09/23/2001 Document Revised: 03/04/2016 Document Reviewed: 06/08/2015 Elsevier Interactive Patient Education  2017 [employer].

## 2017-05-13 NOTE — Progress Notes (Signed)
Full Name: [name] Gender: Female MRN #: [id number] Date of Birth: 1960-12-29    Visit Date: 05/13/2017 10:41 Age: 56 Years 40 Months Old Examining Physician: Sarina Ill, MD  Referring Physician: Shanon Rosser PA  History: Patient with numbness and paresthesias in the hands and left leg.   Summary: The right median motor nerve showed delayed distal onset 5.5 ms, N<4.4). The left median motor nerve showed delayed distal onset latency (5.1 ms, N<4.4). The left peroneal motor nerve showed reduced amplitude (1.3 V, N>2). The left and right sural sensory nerves showed no response. The left and right superficial peroneal sensory nerves showed no response.  The left median/ulnar (palm) comparison nerve showed prolonged distal peak latency (Median Palm, 3.59ms, N<2.2) and abnormal peak latency difference (Median Palm-Ulnar Palm, 1.4 ms, N<0.4) with a relative median delay. The right median/ulnar (palm) comparison nerve showed prolonged distal peak latency (Median Palm, 3.56ms, N<2.2) and abnormal peak latency difference (Median Palm-Ulnar Palm, 1.4 ms, N<0.4) with a relative median delay. The right median orthodromic sensory nerve showed delayed distal peak latency (4.48 ms, N<3.4) and reduced amplitude (4 V, N>10). The left median orthodromic sensory nerve showed delayed distal peak latency (4.64 ms, N<3.4) and reduced amplitude (5 V, N>10). The left tibial F-wave showed delayed latency ( 56.8 ms, N<56) All remaining nerves (as detailed in the following tables) were within normal limits.   Conclusion: There is electrophysiologic evidence of bilateral moderately-severe carpal tunnel syndrome. 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 The reduced amplitude of the left peroneal motor nerve in conjunction with the delayed left tibial F-wave may be due to polyneuropathy however this is more likely due to left lumbosacral radiculopathy given patient's  clinical left leg symptoms.   The results above were discussed in detail with patient. Most recent MRI of the brain with and without contrast was also reviewed with patient; images were reviewed and explained. There were no changes as compared to MRI of the brain in February. We discussed carpal tunnel syndrome and we will refer to orthopedics for possible surgical or other interventions. We discussed lumbar radiculopathy recommended an MRI of the lumbar spine. We also reviewed previous labs today and discussed polyneuropathy in the different causes. Discussed the causes of peripheral neuropathy, the most common being diabetes which patient does not report having. About 20 million people in the [place] states have some form of peripheral neuropathy. This is a condition that develops as a result of damage to the peripheral nervous system. There are multiple causes eluding metabolic, toxic, infectious and endocrine disorders, small vessel disease, autoimmune diseases, and others. We will order several labs to complete an extensive serum neuropathy screening. A total of 25 minutes was spent in with this patient in addition to the EMG nerve conduction study. Over half this time was spent on counseling patient on the carpal tunnel syndrome, lumbar radiculopathy, peripheral polyneuropathy diagnosis and different therapeutic options available.     Sarina Ill, M.D.  [facility] [place], [place] [address] Tel: [phone] Fax: [phone]        [facility]    Nerve / Sites Rec. Site Latency Ref. Amplitude Ref. Rel Amp Segments Distance Velocity Ref. Area    ms ms mV mV %  cm m/s m/s mVms  R Median - APB     Wrist APB 5.5 ?4.4 6.6 ?4.0 100 Wrist - APB 7   24.8     Upper arm  APB 10.1  6.1  93.5 Upper arm - Wrist 25 55 ?49 24.1  L Median - APB     Wrist APB 5.1 ?4.4 6.8 ?4.0 100 Wrist - APB 7   23.5     Upper arm APB 9.2  5.1  75.1 Upper arm - Wrist 23 55 ?49 17.6  R Ulnar -  ADM     Wrist ADM 2.4 ?3.3 9.1 ?6.0 100 Wrist - ADM 7   29.9     B.Elbow ADM 5.8  8.8  96.4 B.Elbow - Wrist 20 59 ?49 29.2     A.Elbow ADM 8.0  8.5  96.1 A.Elbow - B.Elbow 12 55 ?49 28.4         A.Elbow - Wrist      L Peroneal - EDB     Ankle EDB 5.2 ?6.5 1.3 ?2.0 100 Ankle - EDB 9   3.6     Fib head EDB 12.8  1.0  76.1 Fib head - Ankle 36 47 ?44 3.3     Pop fossa EDB 14.7  0.8  85.4 Pop fossa - Fib head 10 52 ?44 2.9         Pop fossa - Ankle      R Peroneal - EDB     Ankle EDB 4.6 ?6.5 2.8 ?2.0 100 Ankle - EDB 9   8.8     Fib head EDB 12.6  2.7  98.1 Fib head - Ankle 38 48 ?44 8.8     Pop fossa EDB 14.5  2.8  102 Pop fossa - Fib head 10 51 ?44 9.4         Pop fossa - Ankle      L Tibial - AH     Ankle AH 4.8 ?5.8 6.5 ?4.0 100 Ankle - AH 9   17.1     Pop fossa AH 14.3  4.6  71.6 Pop fossa - Ankle 39 41 ?41 15.1  R Tibial - AH     Ankle AH 4.3 ?5.8 5.3 ?4.0 100 Ankle - AH 9   13.6     Pop fossa AH 13.9  4.9  92.5 Pop fossa - Ankle 39 41 ?41 13.9                   SNC    Nerve / Sites Rec. Site Peak Lat Ref.  Amp Ref. Segments Distance Peak Diff Ref.    ms ms V V  cm ms ms  L Sural - Ankle (Calf)     Calf Ankle NR ?4.40 NR ?6 Calf - Ankle 14    R Sural - Ankle (Calf)     Calf Ankle NR ?4.40 NR ?6 Calf - Ankle 14    L Superficial peroneal - Ankle     Lat leg Ankle NR ?4.40 NR ?6 Lat leg - Ankle 14    R Superficial peroneal - Ankle     Lat leg Ankle NR ?4.40 NR ?6 Lat leg - Ankle 14    R Median, Ulnar - Transcarpal comparison     Median Palm Wrist 3.80 ?2.20 12 ?35 Median Palm - Wrist 8       Ulnar Palm Wrist 2.40 ?2.20 14 ?12 Ulnar Palm - Wrist 8          Median Palm - Ulnar Palm  1.4 ?0.4  L Median, Ulnar - Transcarpal comparison     Median Palm Wrist 3.39 ?2.20 19 ?35 Median Palm - Wrist 8  Ulnar Palm Wrist 2.03 ?2.20 9 ?12 Ulnar Palm - Wrist 8          Median Palm - Ulnar Palm  1.4 ?0.4  R Median - Orthodromic (Dig II, Mid palm)     Dig II Wrist 4.48 ?3.40 4 ?10 Dig  II - Wrist 13    L Median - Orthodromic (Dig II, Mid palm)     Dig II Wrist 4.64 ?3.40 5 ?10 Dig II - Wrist 13    R Ulnar - Orthodromic, (Dig V, Mid palm)     Dig V Wrist 2.66 ?3.10 5 ?5 Dig V - Wrist 10                         F  Wave    Nerve F Lat Ref.   ms ms  R Ulnar - ADM 28.7 ?32.0  R Tibial - AH 55.1 ?56.0  L Tibial - AH 56.8 ?56.0           EMG full       EMG Summary Table    Spontaneous MUAP Recruitment  Muscle IA Fib PSW Fasc Other Amp Dur. Poly Pattern  L. Deltoid Normal None None None _______ Normal Normal Normal Normal  R. Deltoid Normal None None None _______ Normal Normal Normal Normal  L. Triceps brachii Normal None None None _______ Normal Normal Normal Normal  R. Triceps brachii Normal None None None _______ Normal Normal Normal Normal  L. First dorsal interosseous Normal None None None _______ Normal Normal Normal Normal  R. First dorsal interosseous Normal None None None _______ Normal Normal Normal Normal  L. Opponens pollicis Normal None None None _______ Normal Normal Normal Normal  R. Opponens pollicis Normal None None None _______ Normal Normal Normal Normal  L. Pronator teres Normal None None None _______ Normal Normal Normal Normal  R. Pronator teres Normal None None None _______ Normal Normal Normal Normal  L. Cervical paraspinals (low) Normal None None None _______ Normal Normal Normal Normal  R. Cervical paraspinals (low) Normal None None None _______ Normal Normal Normal Normal

## 2017-05-13 NOTE — Telephone Encounter (Signed)
The MRI is unchanged.

## 2017-05-14 ENCOUNTER — Telehealth: Payer: Self-pay | Admitting: Neurology

## 2017-05-14 NOTE — Telephone Encounter (Signed)
Called  And spoke with pt and went over the lab work. Informed the pt that her A1c was in prediabetic range and to make sure she follows this up with PCP. Pt verbalized understanding and had no questions.

## 2017-05-14 NOTE — Telephone Encounter (Signed)
-----   Message from Melvenia Beam, MD sent at 05/14/2017  9:02 AM EDT ----- Labs unremarkable, hgba1c is slightly elevated technically pre-diabetes

## 2017-05-16 LAB — B12 AND FOLATE PANEL
FOLATE: 8.3 ng/mL (ref >3.0)
Vitamin B-12: 423 pg/mL (ref 232–1245)

## 2017-05-16 LAB — HEMOGLOBIN A1C
ESTIMATED AVERAGE GLUCOSE: 117 mg/dL
HEMOGLOBIN A1C: 5.7 % — AB (ref 4.8–5.6)

## 2017-05-16 LAB — METHYLMALONIC ACID, SERUM: Methylmalonic Acid: 244 nmol/L (ref 0–378)

## 2017-05-19 NOTE — Procedures (Signed)
Full Name: [name] Gender: Female MRN #: [id number] Date of Birth: 14-Jul-1961    Visit Date: 05/13/2017 10:41 Age: 56 Years 33 Months Old Examining Physician: Sarina Ill, MD  Referring Physician: Shanon Rosser PA  History: Patient with numbness and paresthesias in the hands and left leg.   Summary: The right median motor nerve showed delayed distal onset 5.5 ms, N<4.4). The left median motor nerve showed delayed distal onset latency (5.1 ms, N<4.4). The left peroneal motor nerve showed reduced amplitude (1.3 V, N>2). The left and right sural sensory nerves showed no response. The left and right superficial peroneal sensory nerves showed no response.  The left median/ulnar (palm) comparison nerve showed prolonged distal peak latency (Median Palm, 3.62ms, N<2.2) and abnormal peak latency difference (Median Palm-Ulnar Palm, 1.4 ms, N<0.4) with a relative median delay. The right median/ulnar (palm) comparison nerve showed prolonged distal peak latency (Median Palm, 3.76ms, N<2.2) and abnormal peak latency difference (Median Palm-Ulnar Palm, 1.4 ms, N<0.4) with a relative median delay. The right median orthodromic sensory nerve showed delayed distal peak latency (4.48 ms, N<3.4) and reduced amplitude (4 V, N>10). The left median orthodromic sensory nerve showed delayed distal peak latency (4.64 ms, N<3.4) and reduced amplitude (5 V, N>10). The left tibial F-wave showed delayed latency ( 56.8 ms, N<56) All remaining nerves (as detailed in the following tables) were within normal limits.   Conclusion: There is electrophysiologic evidence of bilateral moderately-severe carpal tunnel syndrome. 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 The reduced amplitude of the left peroneal motor nerve in conjunction with the delayed left tibial F-wave may be due to polyneuropathy however this is more likely due to left lumbosacral radiculopathy given patient's  left leg symptoms.   The results above were discussed in detail with patient. Most recent MRI of the brain with and without contrast was also reviewed with patient; images were reviewed and explained. There were no changes as compared to MRI of the brain in February. We discussed carpal tunnel syndrome and we will refer to orthopedics for possible surgical or other interventions. We discussed lumbar radiculopathy recommended an MRI of the lumbar spine. We also reviewed previous labs today and discussed polyneuropathy and the different causes. Discussed the causes of peripheral neuropathy, the most common being diabetes which patient does not report having. About 20 million people in the [place] states have some form of peripheral neuropathy. This is a condition that develops as a result of damage to the peripheral nervous system. There are multiple causes eluding metabolic, toxic, infectious and endocrine disorders, small vessel disease, autoimmune diseases, and others. We will order several additional labs to complete an extensive serum neuropathy screening. A total of 25 minutes was spent in with this patient in addition to the EMG nerve conduction study. Over half this time was spent on counseling patient on the carpal tunnel syndrome, lumbar radiculopathy, peripheral polyneuropathy diagnosis and different therapeutic options available.     Sarina Ill, M.D.  [facility] [place], [place] [address] Tel: [phone] Fax: [phone]        [facility]    Nerve / Sites Rec. Site Latency Ref. Amplitude Ref. Rel Amp Segments Distance Velocity Ref. Area    ms ms mV mV %  cm m/s m/s mVms  R Median - APB     Wrist APB 5.5 ?4.4 6.6 ?4.0 100 Wrist - APB 7   24.8     Upper arm  APB 10.1  6.1  93.5 Upper arm - Wrist 25 55 ?49 24.1  L Median - APB     Wrist APB 5.1 ?4.4 6.8 ?4.0 100 Wrist - APB 7   23.5     Upper arm APB 9.2  5.1  75.1 Upper arm - Wrist 23 55 ?49 17.6  R Ulnar -  ADM     Wrist ADM 2.4 ?3.3 9.1 ?6.0 100 Wrist - ADM 7   29.9     B.Elbow ADM 5.8  8.8  96.4 B.Elbow - Wrist 20 59 ?49 29.2     A.Elbow ADM 8.0  8.5  96.1 A.Elbow - B.Elbow 12 55 ?49 28.4         A.Elbow - Wrist      L Peroneal - EDB     Ankle EDB 5.2 ?6.5 1.3 ?2.0 100 Ankle - EDB 9   3.6     Fib head EDB 12.8  1.0  76.1 Fib head - Ankle 36 47 ?44 3.3     Pop fossa EDB 14.7  0.8  85.4 Pop fossa - Fib head 10 52 ?44 2.9         Pop fossa - Ankle      R Peroneal - EDB     Ankle EDB 4.6 ?6.5 2.8 ?2.0 100 Ankle - EDB 9   8.8     Fib head EDB 12.6  2.7  98.1 Fib head - Ankle 38 48 ?44 8.8     Pop fossa EDB 14.5  2.8  102 Pop fossa - Fib head 10 51 ?44 9.4         Pop fossa - Ankle      L Tibial - AH     Ankle AH 4.8 ?5.8 6.5 ?4.0 100 Ankle - AH 9   17.1     Pop fossa AH 14.3  4.6  71.6 Pop fossa - Ankle 39 41 ?41 15.1  R Tibial - AH     Ankle AH 4.3 ?5.8 5.3 ?4.0 100 Ankle - AH 9   13.6     Pop fossa AH 13.9  4.9  92.5 Pop fossa - Ankle 39 41 ?41 13.9                   SNC    Nerve / Sites Rec. Site Peak Lat Ref.  Amp Ref. Segments Distance Peak Diff Ref.    ms ms V V  cm ms ms  L Sural - Ankle (Calf)     Calf Ankle NR ?4.40 NR ?6 Calf - Ankle 14    R Sural - Ankle (Calf)     Calf Ankle NR ?4.40 NR ?6 Calf - Ankle 14    L Superficial peroneal - Ankle     Lat leg Ankle NR ?4.40 NR ?6 Lat leg - Ankle 14    R Superficial peroneal - Ankle     Lat leg Ankle NR ?4.40 NR ?6 Lat leg - Ankle 14    R Median, Ulnar - Transcarpal comparison     Median Palm Wrist 3.80 ?2.20 12 ?35 Median Palm - Wrist 8       Ulnar Palm Wrist 2.40 ?2.20 14 ?12 Ulnar Palm - Wrist 8          Median Palm - Ulnar Palm  1.4 ?0.4  L Median, Ulnar - Transcarpal comparison     Median Palm Wrist 3.39 ?2.20 19 ?35 Median Palm - Wrist 8  Ulnar Palm Wrist 2.03 ?2.20 9 ?12 Ulnar Palm - Wrist 8          Median Palm - Ulnar Palm  1.4 ?0.4  R Median - Orthodromic (Dig II, Mid palm)     Dig II Wrist 4.48 ?3.40 4 ?10 Dig  II - Wrist 13    L Median - Orthodromic (Dig II, Mid palm)     Dig II Wrist 4.64 ?3.40 5 ?10 Dig II - Wrist 13    R Ulnar - Orthodromic, (Dig V, Mid palm)     Dig V Wrist 2.66 ?3.10 5 ?5 Dig V - Wrist 61                         F  Wave    Nerve F Lat Ref.   ms ms  R Ulnar - ADM 28.7 ?32.0  R Tibial - AH 55.1 ?56.0  L Tibial - AH 56.8 ?56.0           EMG full       EMG Summary Table    Spontaneous MUAP Recruitment  Muscle IA Fib PSW Fasc Other Amp Dur. Poly Pattern  L. Deltoid Normal None None None _______ Normal Normal Normal Normal  R. Deltoid Normal None None None _______ Normal Normal Normal Normal  L. Triceps brachii Normal None None None _______ Normal Normal Normal Normal  R. Triceps brachii Normal None None None _______ Normal Normal Normal Normal  L. First dorsal interosseous Normal None None None _______ Normal Normal Normal Normal  R. First dorsal interosseous Normal None None None _______ Normal Normal Normal Normal  L. Opponens pollicis Normal None None None _______ Normal Normal Normal Normal  R. Opponens pollicis Normal None None None _______ Normal Normal Normal Normal  L. Pronator teres Normal None None None _______ Normal Normal Normal Normal  R. Pronator teres Normal None None None _______ Normal Normal Normal Normal  L. Cervical paraspinals (low) Normal None None None _______ Normal Normal Normal Normal  R. Cervical paraspinals (low) Normal None None None _______ Normal Normal Normal Normal

## 2017-06-01 DIAGNOSIS — S01312D Laceration without foreign body of left ear, subsequent encounter: Secondary | ICD-10-CM | POA: Insufficient documentation

## 2017-06-01 DIAGNOSIS — S01312A Laceration without foreign body of left ear, initial encounter: Secondary | ICD-10-CM | POA: Insufficient documentation

## 2017-06-24 ENCOUNTER — Ambulatory Visit: Payer: Self-pay | Admitting: Neurology

## 2017-07-02 ENCOUNTER — Telehealth: Payer: Self-pay

## 2017-07-02 NOTE — Telephone Encounter (Signed)
Forms for FMLA given back to medical records. Dr.Ahern sent pt to [facility] for second opinion. See notes from 04/14/2017, pt was told by Rn and Dr. Jaynee Eagles cannot justify FMLA.. Pt has no follow up appts.

## 2017-10-20 DIAGNOSIS — M25539 Pain in unspecified wrist: Secondary | ICD-10-CM | POA: Insufficient documentation

## 2017-10-21 DIAGNOSIS — S6992XA Unspecified injury of left wrist, hand and finger(s), initial encounter: Secondary | ICD-10-CM | POA: Insufficient documentation

## 2017-10-21 DIAGNOSIS — G5603 Carpal tunnel syndrome, bilateral upper limbs: Secondary | ICD-10-CM | POA: Insufficient documentation

## 2017-11-25 DIAGNOSIS — M79641 Pain in right hand: Secondary | ICD-10-CM | POA: Insufficient documentation

## 2018-10-22 ENCOUNTER — Other Ambulatory Visit: Payer: Self-pay | Admitting: Radiology

## 2018-10-25 ENCOUNTER — Telehealth: Payer: Self-pay | Admitting: Hematology and Oncology

## 2018-10-25 ENCOUNTER — Other Ambulatory Visit: Payer: Self-pay

## 2018-10-25 ENCOUNTER — Encounter: Payer: Self-pay | Admitting: *Deleted

## 2018-10-25 NOTE — Telephone Encounter (Signed)
Spoke with patient to confirm morning [facility] appointment for 1/15, [place] patient no packet sent

## 2018-10-26 ENCOUNTER — Encounter: Payer: Self-pay | Admitting: *Deleted

## 2018-10-26 ENCOUNTER — Other Ambulatory Visit: Payer: Self-pay

## 2018-10-26 ENCOUNTER — Other Ambulatory Visit: Payer: Self-pay | Admitting: Radiology

## 2018-10-26 DIAGNOSIS — C50412 Malignant neoplasm of upper-outer quadrant of left female breast: Secondary | ICD-10-CM | POA: Insufficient documentation

## 2018-10-26 DIAGNOSIS — C50912 Malignant neoplasm of unspecified site of left female breast: Secondary | ICD-10-CM

## 2018-10-26 DIAGNOSIS — Z170 Estrogen receptor positive status [ER+]: Principal | ICD-10-CM

## 2018-10-26 NOTE — Progress Notes (Signed)
[facility] NOTE  Patient Care Team: [name], NP as PCP - General Erroll Luna, MD as Consulting Physician (General Surgery) Nicholas Lose, MD as Consulting Physician (Hematology and Oncology) Kyung Rudd, MD as Consulting Physician (Radiation Oncology)  CHIEF COMPLAINTS/PURPOSE OF CONSULTATION: Newly diagnosed breast cancer  HISTORY OF PRESENTING ILLNESS:  [name] 58 y.o. female is here because of recent diagnosis of invasive ductal carcinoma of the left breast. The cancer was detected by a routine screening mammogram on 10/11/18 and was not palpable prior to diagnosis. A diagnostic mammogram on 10/22/18 showed a lymph node in the left breast in addition to a 5cm architectural distortion in the left breast. A biopsy on 10/22/18 showed the cancer to be stage 2a invasive ductal carcinoma of the left breast, HER2 negative, ER 100%, PR 100%, Ki67 20%, grade 1. The left axillary lymph node was positive for carcinoma.    She presents to the clinic today with her husband. She has 2 children, a son and daughter, and one grandchildren and no history of cancer in the family but notes a history of heart failure. She denies any history of hormone therapy. She started menopause at age 45. She notes she has regular hot flashes at night and wakes up soaked in sweat. She denies any stiffness in her fingers or joints. She noted concern about losing her hair during chemotherapy.   I reviewed her records extensively and collaborated the history with the patient.  SUMMARY OF ONCOLOGIC HISTORY:   Malignant neoplasm of upper-outer quadrant of left breast in female, estrogen receptor positive ([place])   10/22/2018 Initial Diagnosis    Screening detected left breast architectural distortion: 4.5 cm, UOQ, by ultrasound measured 5 cm at 1 o'clock position multiple enlarged lymph nodes, biopsy revealed grade 1 IDC with DCIS with lymphovascular invasion, lymph node biopsy  positive, intramammary lymph node biopsy negative, ER 100%, PR 100%, Ki-67 20%, HER-2 1+ by IHC, T2 and 1 stage 2A      MEDICAL HISTORY:  Past Medical History:  Diagnosis Date  . Fatigue   . GERD (gastroesophageal reflux disease)    w nonobstructing esophageal stricture  . Headache   . Heat intolerance   . History of ETT 6/10   myoview EF 65%, normal wall motion, no ischemia or infarction, poor exercise capacity  . HTN (hypertension)   . Normal echocardiogram 6/10   EF 16-10% moderate diastolic dysfunction, mild LAE  . Numbness and tingling   . Obesity   . S/P partial hysterectomy     SURGICAL HISTORY: Past Surgical History:  Procedure Laterality Date  . FOOT SURGERY    . hysterectomy (other)    . TONSILLECTOMY      SOCIAL HISTORY: Social History   Socioeconomic History  . Marital status: Single    Spouse name: Not on file  . Number of children: 2  . Years of education: Not on file  . Highest education level: Not on file  Occupational History  . Occupation: Pharmacist, hospital  Social Needs  . Financial resource strain: Not on file  . Food insecurity:    Worry: Not on file    Inability: Not on file  . Transportation needs:    Medical: Not on file    Non-medical: Not on file  Tobacco Use  . Smoking status: Never Smoker  . Smokeless tobacco: Never Used  Substance and Sexual Activity  . Alcohol use: No  . Drug use: No  . Sexual activity:  Yes    Birth control/protection: Surgical  Lifestyle  . Physical activity:    Days per week: Not on file    Minutes per session: Not on file  . Stress: Not on file  Relationships  . Social connections:    Talks on phone: Not on file    Gets together: Not on file    Attends religious service: Not on file    Active member of club or organization: Not on file    Attends meetings of clubs or organizations: Not on file    Relationship status: Not on file  . Intimate partner violence:    Fear of current or ex partner: Not on file     Emotionally abused: Not on file    Physically abused: Not on file    Forced sexual activity: Not on file  Other Topics Concern  . 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 Heart attack Mother 4  . Heart disease Mother   . Heart attack Maternal Grandmother        age 38 or 90  . Heart disease Maternal Grandmother   . Neuropathy Neg Hx     ALLERGIES:  has No Known Allergies.  MEDICATIONS:  Current Outpatient Medications  Medication Sig Dispense Refill  . AMOXICILLIN PO Take 1 tablet by mouth 2 (two) times daily.    . carvedilol (COREG) 6.25 MG tablet TAKE 1 TABLET BY MOUTH TWICE DAILY 180 tablet 1  . ibuprofen (ADVIL,MOTRIN) 600 MG tablet Take 1 tablet (600 mg total) by mouth every 6 (six) hours as needed. 30 tablet 0  . lisinopril-hydrochlorothiazide (PRINZIDE,ZESTORETIC) 20-12.5 MG per tablet TAKE 2 TABLETS BY MOUTH DAILY 180 tablet 1  . raNITIdine HCl (RANITIDINE ACID REDUCER PO) Take 1 tablet by mouth daily.    Marland Kitchen ALPRAZolam (XANAX) 0.5 MG tablet Take 1-2 tablets 30-60 minutes before MRI. May take one additional if needed. Do not drive while taking this medication. (Patient not taking: Reported on 10/27/2018) 3 tablet 0  . aspirin (ASPIR-81) 81 MG EC tablet Take 81 mg by mouth daily.      Marland Kitchen gabapentin (NEURONTIN) 100 MG capsule Take 1 capsule (100 mg total) by mouth 3 (three) times daily. (Patient not taking: Reported on 10/27/2018) 90 capsule 11   No current facility-administered medications for this visit.    Facility-Administered Medications Ordered in Other Visits  Medication Dose Route Frequency Provider Last Rate Last Dose  . gadopentetate dimeglumine (MAGNEVIST) injection 20 mL  20 mL Intravenous Once PRN Melvenia Beam, MD      . gadopentetate dimeglumine (MAGNEVIST) injection 20 mL  20 mL  Intravenous Once PRN Melvenia Beam, MD        REVIEW OF SYSTEMS:   Constitutional: Denies fevers, chills or abnormal night sweats (+) regular hot flashes Eyes: Denies blurriness of vision, double vision or watery eyes Ears, nose, mouth, throat, and face: Denies mucositis or sore throat Respiratory: Denies cough, dyspnea or wheezes Cardiovascular: Denies palpitation, chest discomfort or lower extremity swelling Gastrointestinal:  Denies nausea, heartburn or change in bowel habits Skin: Denies abnormal skin rashes Lymphatics: Denies new lymphadenopathy or easy bruising Neurological:Denies numbness, tingling or new weaknesses Behavioral/Psych: Mood is stable, no new changes  Breast: Denies any discharge All other systems  were reviewed with the patient and are negative.  PHYSICAL EXAMINATION: ECOG PERFORMANCE STATUS: 1 - Symptomatic but completely ambulatory  Vitals:   10/27/18 0913  BP: 113/84  Pulse: (!) 101  Resp: 20  Temp: 98.7 F (37.1 C)  SpO2: 97%   Filed Weights   10/27/18 0913  Weight: 286 lb 12.8 oz (130.1 kg)    GENERAL:alert, no distress and comfortable SKIN: skin color, texture, turgor are normal, no rashes or significant lesions EYES: normal, conjunctiva are pink and non-injected, sclera clear OROPHARYNX:no exudate, no erythema and lips, buccal mucosa, and tongue normal  NECK: supple, thyroid normal size, non-tender, without nodularity LYMPH:  no palpable lymphadenopathy in the cervical, axillary or inguinal LUNGS: clear to auscultation and percussion with normal breathing effort HEART: regular rate & rhythm and no murmurs and no lower extremity edema ABDOMEN:abdomen soft, non-tender and normal bowel sounds Musculoskeletal:no cyanosis of digits and no clubbing  PSYCH: alert & oriented x 3 with fluent speech NEURO: no focal motor/sensory deficits  LABORATORY DATA:  I have reviewed the data as listed Lab Results  Component Value Date   WBC 6.4 10/27/2018    HGB 12.5 10/27/2018   HCT 40.1 10/27/2018   MCV 86.8 10/27/2018   PLT 185 10/27/2018   Lab Results  Component Value Date   NA 141 10/27/2018   K 4.0 10/27/2018   CL 108 10/27/2018   CO2 22 10/27/2018    RADIOGRAPHIC STUDIES: I have personally reviewed the radiological reports and agreed with the findings in the report.  ASSESSMENT AND PLAN:  Malignant neoplasm of upper-outer quadrant of left breast in female, estrogen receptor positive ([place]) 10/22/2018: Screening detected left breast architectural distortion: 4.5 cm, UOQ, by ultrasound measured 5 cm at 1 o'clock position multiple enlarged lymph nodes, biopsy revealed grade 1 IDC with DCIS with lymphovascular invasion, lymph node biopsy positive, intramammary lymph node biopsy negative, ER 100%, PR 100%, Ki-67 20%, HER-2 1+ by IHC, T2 and 1 stage Ib  Recommendation: 1.  Neoadjuvant therapy consideration for neoadjuvant chemo versus neoadjuvant antiestrogen therapy.  We will get a breast MRI to determine how many lymph nodes are truly involved.  If more than 3 lymph nodes are positive then patient will need systemic chemotherapy.  If less than we will give neoadjuvant antiestrogen therapy for 6 months 2.  Followed by mastectomy 3.  Adjuvant radiation 4.  Adjuvant antiestrogen therapy  Treatment plan: We will start the patient on neoadjuvant antiestrogen therapy with anastrozole 1 mg p.o. daily starting today. Anastrozole counseling:We discussed the risks and benefits of anti-estrogen therapy with aromatase inhibitors. These include but not limited to insomnia, hot flashes, mood changes, vaginal dryness, bone density loss, and weight gain. We strongly believe that the benefits far outweigh the risks. Patient understands these risks and consented to starting treatment.   Return to clinic after the breast MRI to discuss the results. All questions were answered. The patient knows to call the clinic with any problems, questions or  concerns.   Nicholas Lose, MD 10/27/2018   I, Cloyde Reams Dorshimer, am acting as scribe for Nicholas Lose, MD.  I have reviewed the above documentation for accuracy and completeness, and I agree with the above.

## 2018-10-27 ENCOUNTER — Inpatient Hospital Stay: Payer: BC Managed Care – PPO

## 2018-10-27 ENCOUNTER — Encounter: Payer: Self-pay | Admitting: Hematology and Oncology

## 2018-10-27 ENCOUNTER — Other Ambulatory Visit: Payer: Self-pay

## 2018-10-27 ENCOUNTER — Ambulatory Visit
Admission: RE | Admit: 2018-10-27 | Discharge: 2018-10-27 | Disposition: A | Discharge status: Home / Self Care | Payer: BC Managed Care – PPO | Source: Ambulatory Visit | Attending: Radiation Oncology | Admitting: Radiation Oncology

## 2018-10-27 ENCOUNTER — Ambulatory Visit: Payer: BC Managed Care – PPO | Admitting: Physical Therapy

## 2018-10-27 ENCOUNTER — Inpatient Hospital Stay: Payer: BC Managed Care – PPO | Attending: Hematology and Oncology | Admitting: Hematology and Oncology

## 2018-10-27 ENCOUNTER — Encounter: Payer: Self-pay | Admitting: Physical Therapy

## 2018-10-27 ENCOUNTER — Ambulatory Visit: Payer: Self-pay | Admitting: Surgery

## 2018-10-27 DIAGNOSIS — Z170 Estrogen receptor positive status [ER+]: Principal | ICD-10-CM

## 2018-10-27 DIAGNOSIS — C773 Secondary and unspecified malignant neoplasm of axilla and upper limb lymph nodes: Secondary | ICD-10-CM | POA: Diagnosis not present

## 2018-10-27 DIAGNOSIS — C50412 Malignant neoplasm of upper-outer quadrant of left female breast: Secondary | ICD-10-CM

## 2018-10-27 DIAGNOSIS — N951 Menopausal and female climacteric states: Secondary | ICD-10-CM | POA: Insufficient documentation

## 2018-10-27 DIAGNOSIS — R293 Abnormal posture: Secondary | ICD-10-CM | POA: Insufficient documentation

## 2018-10-27 DIAGNOSIS — Z5111 Encounter for antineoplastic chemotherapy: Secondary | ICD-10-CM | POA: Insufficient documentation

## 2018-10-27 LAB — CMP (CANCER CENTER ONLY)
ALBUMIN: 3.6 g/dL (ref 3.5–5.0)
ALT: 15 U/L (ref 0–44)
ANION GAP: 11 (ref 5–15)
AST: 14 U/L — ABNORMAL LOW (ref 15–41)
Alkaline Phosphatase: 68 U/L (ref 38–126)
BILIRUBIN TOTAL: 0.5 mg/dL (ref 0.3–1.2)
BUN: 17 mg/dL (ref 6–20)
CO2: 22 mmol/L (ref 22–32)
Calcium: 9.2 mg/dL (ref 8.9–10.3)
Chloride: 108 mmol/L (ref 98–111)
Creatinine: 0.87 mg/dL (ref 0.44–1.00)
GFR, Estimated: >60 mL/min (ref >60)
GLUCOSE: 99 mg/dL (ref 70–99)
POTASSIUM: 4 mmol/L (ref 3.5–5.1)
Sodium: 141 mmol/L (ref 135–145)
TOTAL PROTEIN: 7.3 g/dL (ref 6.5–8.1)

## 2018-10-27 LAB — CBC WITH DIFFERENTIAL (CANCER CENTER ONLY)
Abs Immature Granulocytes: 0.03 10*3/uL (ref 0.00–0.07)
BASOS ABS: 0 10*3/uL (ref 0.0–0.1)
Basophils Relative: 1 %
EOS ABS: 0.2 10*3/uL (ref 0.0–0.5)
EOS PCT: 2 %
HCT: 40.1 % (ref 36.0–46.0)
Hemoglobin: 12.5 g/dL (ref 12.0–15.0)
Immature Granulocytes: 1 %
Lymphocytes Relative: 28 %
Lymphs Abs: 1.8 10*3/uL (ref 0.7–4.0)
MCH: 27.1 pg (ref 26.0–34.0)
MCHC: 31.2 g/dL (ref 30.0–36.0)
MCV: 86.8 fL (ref 80.0–100.0)
Monocytes Absolute: 0.5 10*3/uL (ref 0.1–1.0)
Monocytes Relative: 7 %
NRBC: 0 % (ref 0.0–0.2)
Neutro Abs: 4 10*3/uL (ref 1.7–7.7)
Neutrophils Relative %: 61 %
Platelet Count: 185 10*3/uL (ref 150–400)
RBC: 4.62 MIL/uL (ref 3.87–5.11)
RDW: 12.5 % (ref 11.5–15.5)
WBC: 6.4 10*3/uL (ref 4.0–10.5)

## 2018-10-27 NOTE — Addendum Note (Signed)
Encounter addended by: Hayden Pedro, PA-C on: 10/27/2018 11:02 AM  Actions taken: LOS modified

## 2018-10-27 NOTE — H&P (Signed)
[name] Documented: 10/27/2018 7:36 AM Location: [facility] Patient #: [id number] DOB: [birth year] Undefined / Language: [name] / Race: Black or African American Female  History of Present Illness [name] [name] MD; 10/27/2018 10:42 AM) Patient words: Pt sent at the request of Dr [name] for SD mammographic abnormality left breast 5 cm by U/S   Core biopsy shows ER POS PR POS HER 2 NEU NEGATIVE IDC AND POSITIVE LN AND 4 OTHERS THAT ARE ENLARGED.  NO COMPLAINTS TODAY.  The patient is a 58 year old female.   Past Surgical History [name], RN; 10/27/2018 7:36 AM) Breast Biopsy Left. Foot Surgery Bilateral. Hysterectomy (not due to cancer) - Partial Tonsillectomy  Diagnostic Studies History [name], RN; 10/27/2018 7:36 AM) Colonoscopy never Mammogram within last year Pap Smear 1-5 years ago  Medication History [name], RN; 10/27/2018 7:36 AM) Medications Reconciled  Social History [name], RN; 10/27/2018 7:36 AM) Caffeine use Carbonated beverages, Coffee, Tea. No alcohol use No drug use Tobacco use Never smoker.  Family History [name], RN; 10/27/2018 7:36 AM) Alcohol Abuse Father. Arthritis Family Members In General, Mother. Cerebrovascular Accident Family Members In General. Heart Disease Family Members In General, Mother. Heart disease in female family member before age 16 Hypertension Father, Mother, Son. Ischemic Bowel Disease Sister. Thyroid problems Family Members In General, Sister.  Pregnancy / Birth History [name], RN; 10/27/2018 7:36 AM) Age of menopause 70-50 Gravida 2 Irregular periods Maternal age 51-25 Para 2  Other Problems [name], RN; 10/27/2018 7:36 AM) Gastroesophageal Reflux Disease Hemorrhoids     Review of Systems [name] RN; 10/27/2018 7:36 AM) General Present- Night Sweats. Not Present- Appetite Loss, Chills, Fatigue, Fever,  Weight Gain and Weight Loss. HEENT Not Present- Earache, Hearing Loss, Hoarseness, Nose Bleed, Oral Ulcers, Ringing in the Ears, Seasonal Allergies, Sinus Pain, Sore Throat, Visual Disturbances, Wears glasses/contact lenses and Yellow Eyes. Respiratory Present- Snoring. Not Present- Bloody sputum, Chronic Cough, Difficulty Breathing and Wheezing. Breast Not Present- Breast Mass, Breast Pain, Nipple Discharge and Skin Changes. Cardiovascular Not Present- Chest Pain, Difficulty Breathing Lying Down, Leg Cramps, Palpitations, Rapid Heart Rate, Shortness of Breath and Swelling of Extremities. Gastrointestinal Present- Indigestion. Not Present- Abdominal Pain, Bloating, Bloody Stool, Change in Bowel Habits, Chronic diarrhea, Constipation, Difficulty Swallowing, Excessive gas, Gets full quickly at meals, Hemorrhoids, Nausea, Rectal Pain and Vomiting. Female Genitourinary Present- Frequency and Nocturia. Not Present- Painful Urination, Pelvic Pain and Urgency. Musculoskeletal Not Present- Back Pain, Joint Pain, Joint Stiffness, Muscle Pain, Muscle Weakness and Swelling of Extremities. Neurological Present- Headaches. Not Present- Decreased Memory, Fainting, Numbness, Seizures, Tingling, Tremor, Trouble walking and Weakness. Endocrine Present- Hair Changes and Hot flashes. Not Present- Cold Intolerance, Excessive Hunger, Heat Intolerance and New Diabetes. Hematology Present- Easy Bruising. Not Present- Blood Thinners, Excessive bleeding, Gland problems, HIV and Persistent Infections.   Physical Exam ([name] A. [name] MD; 10/27/2018 10:42 AM)  General Mental Status-Alert. General Appearance-Consistent with stated age. Hydration-Well hydrated. Voice-Normal.  Head and Neck Head-normocephalic, atraumatic with no lesions or palpable masses. Trachea-midline. Thyroid Gland Characteristics - normal size and consistency.  Chest and Lung Exam Chest and lung exam reveals -quiet, even and easy  respiratory effort with no use of accessory muscles and on auscultation, normal breath sounds, no adventitious sounds and normal vocal resonance. Inspection Chest Wall - Normal. Back - normal.  Breast Breast - Left-Symmetric, Non Tender, No Biopsy scars, no Dimpling, No Inflammation, No Lumpectomy scars, No Mastectomy scars, No Peau d' Orange.  Breast - Right-Symmetric, Non Tender, No Biopsy scars, no Dimpling, No Inflammation, No Lumpectomy scars, No Mastectomy scars, No Peau d' Orange. Breast Lump-No Palpable Breast Mass.  Cardiovascular Cardiovascular examination reveals -normal heart sounds, regular rate and rhythm with no murmurs and normal pedal pulses bilaterally.  Neurologic Neurologic evaluation reveals -alert and oriented x 3 with no impairment of recent or remote memory. Mental Status-Normal.  Lymphatic Head & Neck  General Head & Neck Lymphatics: Bilateral - Description - Normal. Axillary  General Axillary Region: Bilateral - Description - Normal. Tenderness - Non Tender.    Assessment & Plan ([name] A. Sachit Gilman MD; 10/27/2018 10:45 AM)  BREAST CANCER, LEFT (C50.912) Impression: check MRI may need port first left lumpectomy and targeted LN biopsy vs ALND and SLN mapping  Pt requires port placement for chemotherapy. Risk include bleeding, infection, pneumothorax, hemothorax, mediastinal injury, nerve injury , blood vessel injury, strke, blood clots, death, migration. embolization and need for additional procedures. Pt agrees to proceed. Risk of lumpectomy include bleeding, infection, seroma, more surgery, use of seed/wire, wound care, cosmetic deformity and the need for other treatments, death , blood clots, death. Pt agrees to proceed. Risk of sentinel lymph node mapping include bleeding, infection, lymphedema, shoulder pain. stiffness, dye allergy. cosmetic deformity , blood clots, death, need for more surgery. Pt agres to proceed.  Current Plans Use of a  central venous catheter for intravenous therapy was discussed. Technique of catheter placement using ultrasound and fluoroscopy guidance was discussed. Risks such as bleeding, infection, pneumothorax, catheter occlusion, reoperation, and other risks were discussed. I noted a good likelihood this will help address the problem. Questions were answered. The patient expressed understanding & wishes to proceed. Pt Education - [facility] Breast Cancer Information Given - [facility] "Breast Journey" Package Pt Education - [facility] (After Breast Cancer) Class Info: discussed with patient and provided information. Pt Education - [facility] Breast Biopsy HCI: discussed with patient and provided information. We discussed the staging and pathophysiology of breast cancer. We discussed all of the different options for treatment for breast cancer including surgery, chemotherapy, radiation therapy, Herceptin, and antiestrogen therapy. We discussed a sentinel lymph node biopsy as she does not appear to having lymph node involvement right now. We discussed the performance of that with injection of radioactive tracer and blue dye. We discussed that she would have an incision underneath her axillary hairline. We discussed that there is a bout a 10-20% chance of having a positive node with a sentinel lymph node biopsy and we will await the permanent pathology to make any other first further decisions in terms of her treatment. One of these options might be to return to the operating room to perform an axillary lymph node dissection. We discussed about a 1-2% risk lifetime of chronic shoulder pain as well as lymphedema associated with a sentinel lymph node biopsy. We discussed the options for treatment of the breast cancer which included lumpectomy versus a mastectomy. We discussed the performance of the lumpectomy with a wire placement. We discussed a 10-20% chance of a positive margin requiring reexcision in the operating room. We also discussed  that she may need radiation therapy or antiestrogen therapy or both if she undergoes lumpectomy. We discussed the mastectomy and the postoperative care for that as well. We discussed that there is no difference in her survival whether she undergoes lumpectomy with radiation therapy or antiestrogen therapy versus a mastectomy. There is a slight difference in the local recurrence rate being 3-5% with lumpectomy and about  1% with a mastectomy. We discussed the risks of operation including bleeding, infection, possible reoperation. She understands her further therapy will be based on what her stages at the time of her operation.  Pt Education - [facility] breast cancer surgery: discussed with patient and provided information.

## 2018-10-27 NOTE — Progress Notes (Signed)
Nutrition Assessment  Reason for Assessment:  Pt seen in Breast Clinic  ASSESSMENT:   58 year old female with new diagnosis of breast cancer.  Past medical history of reviewed.  Medications:  reviewed  Labs: reviewed  Anthropometrics:   Height: 74 inches Weight: 286 lb 12.8 oz BMI: 36   NUTRITION DIAGNOSIS: Food and nutrition related knowledge deficit related to new diagnosis of breast cancer as evidenced by no prior need for nutrition related information.  INTERVENTION:   Discussed and provided packet of information regarding nutritional tips for breast cancer patients.  Questions answered.  Teachback method used.  Contact information provided and patient knows to contact me with questions/concerns.    MONITORING, EVALUATION, and GOAL: Pt will consume a healthy plant based diet to maintain lean body mass throughout treatment.   Trestan Vahle B. Zenia Resides, [place], [place] Registered Dietitian [phone] (pager)

## 2018-10-27 NOTE — H&P (View-Only) (Signed)
[name] Documented: 10/27/2018 7:36 AM Location: [facility] Patient #: [id number] DOB: 1961/05/02 Undefined / Language: [name] / Race: Black or African American Female  History of Present Illness [name] A. Daven Montz MD; 10/27/2018 10:42 AM) Patient words: Pt sent at the request of Dr [name] for SD mammographic abnormality left breast 5 cm by U/S   Core biopsy shows ER POS PR POS HER 2 NEU NEGATIVE IDC AND POSITIVE LN AND 4 OTHERS THAT ARE ENLARGED.  NO COMPLAINTS TODAY.  The patient is a 58 year old female.   Past Surgical History [name], RN; 10/27/2018 7:36 AM) Breast Biopsy Left. Foot Surgery Bilateral. Hysterectomy (not due to cancer) - Partial Tonsillectomy  Diagnostic Studies History [name], RN; 10/27/2018 7:36 AM) Colonoscopy never Mammogram within last year Pap Smear 1-5 years ago  Medication History [name], RN; 10/27/2018 7:36 AM) Medications Reconciled  Social History [name], RN; 10/27/2018 7:36 AM) Caffeine use Carbonated beverages, Coffee, Tea. No alcohol use No drug use Tobacco use Never smoker.  Family History [name], RN; 10/27/2018 7:36 AM) Alcohol Abuse Father. Arthritis Family Members In General, Mother. Cerebrovascular Accident Family Members In General. Heart Disease Family Members In General, Mother. Heart disease in female family member before age 68 Hypertension Father, Mother, Son. Ischemic Bowel Disease Sister. Thyroid problems Family Members In General, Sister.  Pregnancy / Birth History [name], RN; 10/27/2018 7:36 AM) Age of menopause 102-50 Gravida 2 Irregular periods Maternal age 45-25 Para 2  Other Problems [name], RN; 10/27/2018 7:36 AM) Gastroesophageal Reflux Disease Hemorrhoids     Review of Systems [name] RN; 10/27/2018 7:36 AM) General Present- Night Sweats. Not Present- Appetite Loss, Chills, Fatigue, Fever,  Weight Gain and Weight Loss. HEENT Not Present- Earache, Hearing Loss, Hoarseness, Nose Bleed, Oral Ulcers, Ringing in the Ears, Seasonal Allergies, Sinus Pain, Sore Throat, Visual Disturbances, Wears glasses/contact lenses and Yellow Eyes. Respiratory Present- Snoring. Not Present- Bloody sputum, Chronic Cough, Difficulty Breathing and Wheezing. Breast Not Present- Breast Mass, Breast Pain, Nipple Discharge and Skin Changes. Cardiovascular Not Present- Chest Pain, Difficulty Breathing Lying Down, Leg Cramps, Palpitations, Rapid Heart Rate, Shortness of Breath and Swelling of Extremities. Gastrointestinal Present- Indigestion. Not Present- Abdominal Pain, Bloating, Bloody Stool, Change in Bowel Habits, Chronic diarrhea, Constipation, Difficulty Swallowing, Excessive gas, Gets full quickly at meals, Hemorrhoids, Nausea, Rectal Pain and Vomiting. Female Genitourinary Present- Frequency and Nocturia. Not Present- Painful Urination, Pelvic Pain and Urgency. Musculoskeletal Not Present- Back Pain, Joint Pain, Joint Stiffness, Muscle Pain, Muscle Weakness and Swelling of Extremities. Neurological Present- Headaches. Not Present- Decreased Memory, Fainting, Numbness, Seizures, Tingling, Tremor, Trouble walking and Weakness. Endocrine Present- Hair Changes and Hot flashes. Not Present- Cold Intolerance, Excessive Hunger, Heat Intolerance and New Diabetes. Hematology Present- Easy Bruising. Not Present- Blood Thinners, Excessive bleeding, Gland problems, HIV and Persistent Infections.   Physical Exam (Elaine Roanhorse A. Shamari Trostel MD; 10/27/2018 10:42 AM)  General Mental Status-Alert. General Appearance-Consistent with stated age. Hydration-Well hydrated. Voice-Normal.  Head and Neck Head-normocephalic, atraumatic with no lesions or palpable masses. Trachea-midline. Thyroid Gland Characteristics - normal size and consistency.  Chest and Lung Exam Chest and lung exam reveals -quiet, even and easy  respiratory effort with no use of accessory muscles and on auscultation, normal breath sounds, no adventitious sounds and normal vocal resonance. Inspection Chest Wall - Normal. Back - normal.  Breast Breast - Left-Symmetric, Non Tender, No Biopsy scars, no Dimpling, No Inflammation, No Lumpectomy scars, No Mastectomy scars, No Peau d' Orange.  Breast - Right-Symmetric, Non Tender, No Biopsy scars, no Dimpling, No Inflammation, No Lumpectomy scars, No Mastectomy scars, No Peau d' Orange. Breast Lump-No Palpable Breast Mass.  Cardiovascular Cardiovascular examination reveals -normal heart sounds, regular rate and rhythm with no murmurs and normal pedal pulses bilaterally.  Neurologic Neurologic evaluation reveals -alert and oriented x 3 with no impairment of recent or remote memory. Mental Status-Normal.  Lymphatic Head & Neck  General Head & Neck Lymphatics: Bilateral - Description - Normal. Axillary  General Axillary Region: Bilateral - Description - Normal. Tenderness - Non Tender.    Assessment & Plan (Lori-Ann Lindfors A. Sheli Dorin MD; 10/27/2018 10:45 AM)  BREAST CANCER, LEFT (C50.912) Impression: check MRI may need port first left lumpectomy and targeted LN biopsy vs ALND and SLN mapping  Pt requires port placement for chemotherapy. Risk include bleeding, infection, pneumothorax, hemothorax, mediastinal injury, nerve injury , blood vessel injury, strke, blood clots, death, migration. embolization and need for additional procedures. Pt agrees to proceed. Risk of lumpectomy include bleeding, infection, seroma, more surgery, use of seed/wire, wound care, cosmetic deformity and the need for other treatments, death , blood clots, death. Pt agrees to proceed. Risk of sentinel lymph node mapping include bleeding, infection, lymphedema, shoulder pain. stiffness, dye allergy. cosmetic deformity , blood clots, death, need for more surgery. Pt agres to proceed.  Current Plans Use of a  central venous catheter for intravenous therapy was discussed. Technique of catheter placement using ultrasound and fluoroscopy guidance was discussed. Risks such as bleeding, infection, pneumothorax, catheter occlusion, reoperation, and other risks were discussed. I noted a good likelihood this will help address the problem. Questions were answered. The patient expressed understanding & wishes to proceed. Pt Education - [facility] Breast Cancer Information Given - [facility] "Breast Journey" Package Pt Education - [facility] (After Breast Cancer) Class Info: discussed with patient and provided information. Pt Education - [facility] Breast Biopsy HCI: discussed with patient and provided information. We discussed the staging and pathophysiology of breast cancer. We discussed all of the different options for treatment for breast cancer including surgery, chemotherapy, radiation therapy, Herceptin, and antiestrogen therapy. We discussed a sentinel lymph node biopsy as she does not appear to having lymph node involvement right now. We discussed the performance of that with injection of radioactive tracer and blue dye. We discussed that she would have an incision underneath her axillary hairline. We discussed that there is a bout a 10-20% chance of having a positive node with a sentinel lymph node biopsy and we will await the permanent pathology to make any other first further decisions in terms of her treatment. One of these options might be to return to the operating room to perform an axillary lymph node dissection. We discussed about a 1-2% risk lifetime of chronic shoulder pain as well as lymphedema associated with a sentinel lymph node biopsy. We discussed the options for treatment of the breast cancer which included lumpectomy versus a mastectomy. We discussed the performance of the lumpectomy with a wire placement. We discussed a 10-20% chance of a positive margin requiring reexcision in the operating room. We also discussed  that she may need radiation therapy or antiestrogen therapy or both if she undergoes lumpectomy. We discussed the mastectomy and the postoperative care for that as well. We discussed that there is no difference in her survival whether she undergoes lumpectomy with radiation therapy or antiestrogen therapy versus a mastectomy. There is a slight difference in the local recurrence rate being 3-5% with lumpectomy and about  1% with a mastectomy. We discussed the risks of operation including bleeding, infection, possible reoperation. She understands her further therapy will be based on what her stages at the time of her operation.  Pt Education - [facility] breast cancer surgery: discussed with patient and provided information.

## 2018-10-27 NOTE — Therapy (Signed)
[facility] [place], [place], [address] Phone: [phone]   Fax:  [phone]  Physical Therapy Evaluation  Patient Details  Name: [name] MRN: [id number] Date of Birth: 1960-11-25 Referring Provider (PT): Dr. Erroll Luna   Encounter Date: 10/27/2018  PT End of Session - 10/27/18 1129    Visit Number  1    Number of Visits  1    PT Start Time  1055    PT Stop Time  1125    PT Time Calculation (min)  30 min    Activity Tolerance  Patient tolerated treatment well    Behavior During Therapy  [facility] for tasks assessed/performed       Past Medical History:  Diagnosis Date  . Fatigue   . GERD (gastroesophageal reflux disease)    w nonobstructing esophageal stricture  . Headache   . Heat intolerance   . History of ETT 6/10   myoview EF 65%, normal wall motion, no ischemia or infarction, poor exercise capacity  . HTN (hypertension)   . Normal echocardiogram 6/10   EF 50-35% moderate diastolic dysfunction, mild LAE  . Numbness and tingling   . Obesity   . S/P partial hysterectomy     Past Surgical History:  Procedure Laterality Date  . FOOT SURGERY    . hysterectomy (other)    . TONSILLECTOMY      There were no vitals filed for this visit.   Subjective Assessment - 10/27/18 0954    Subjective  Patient reports she is here today to be seen by her medical team for her newly diagnosed left breast cancer.    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 It measures 5 cm and is located in the upper outer quadrant. It is ER/PR positive and HER2 negative with a Ki67 of 20%. She has 4 enlarged axillary lymph nodes and 1 biopsied positive node.    Patient Stated Goals  Reduce lymphedema risk and learn post op shoulder ROM HEP    Currently in Pain?  No/denies         [facility] PT Assessment - 10/27/18 0001      Assessment   Medical Diagnosis  Left breast cancer    Referring Provider (PT)  Dr. [name] Corn[place]    Onset Date/Surgical Date  10/11/18    Hand Dominance  Right    Prior Therapy  none      Precautions   Precautions  Other (comment)    Precaution Comments  active cancer      Restrictions   Weight Bearing Restrictions  No      Balance Screen   Has the patient fallen in the past 6 months  No    Has the patient had a decrease in activity level because of a fear of falling?   No    Is the patient reluctant to leave their home because of a fear of falling?   No      Home Environment   Living Environment  Private residence    Living Arrangements  Spouse/significant other    Available Help at Discharge  Family      Prior Function   Level of Independence  Independent    Vocation  Full time employment    Equities trader    Leisure  She was walking regularly until 07/12/18 and then stopped      Cognition  Overall Cognitive Status  Within Functional Limits for tasks assessed      Posture/Postural Control   Posture/Postural Control  Postural limitations    Postural Limitations  Rounded Shoulders;Forward head      ROM / Strength   AROM / PROM / Strength  AROM;Strength      AROM   AROM Assessment Site  Shoulder;Cervical    Right/Left Shoulder  Right;Left    Right Shoulder Extension  62 Degrees    Right Shoulder Flexion  155 Degrees    Right Shoulder ABduction  160 Degrees    Right Shoulder Internal Rotation  60 Degrees    Right Shoulder External Rotation  84 Degrees    Left Shoulder Extension  60 Degrees    Left Shoulder Flexion  153 Degrees    Left Shoulder ABduction  159 Degrees    Left Shoulder Internal Rotation  64 Degrees    Left Shoulder External Rotation  72 Degrees    Cervical Flexion  WNL    Cervical Extension  WNL    Cervical - Right Side Bend  WNL    Cervical - Left Side Bend  WNL    Cervical - Right Rotation  WNL    Cervical - Left Rotation  WNL      Strength    Overall Strength  Within functional limits for tasks performed        LYMPHEDEMA/ONCOLOGY QUESTIONNAIRE - 10/27/18 1127      Type   Cancer Type  Left breast cancer      Lymphedema Assessments   Lymphedema Assessments  Upper extremities      Right Upper Extremity Lymphedema   10 cm Proximal to Olecranon Process  35.2 cm    Olecranon Process  29.4 cm    10 cm Proximal to Ulnar Styloid Process  25.1 cm    Just Proximal to Ulnar Styloid Process  18.4 cm    Across Hand at [employer]  22.4 cm    At [place] of 2nd Digit  7 cm      Left Upper Extremity Lymphedema   10 cm Proximal to Olecranon Process  34.6 cm    Olecranon Process  29.5 cm    10 cm Proximal to Ulnar Styloid Process  24.9 cm    Just Proximal to Ulnar Styloid Process  17.9 cm    Across Hand at [employer]  21.2 cm    At [place] of 2nd Digit  6.7 cm             Objective measurements completed on examination: See above findings.        Patient was instructed today in a home exercise program today for post op shoulder range of motion. These included active assist shoulder flexion in sitting, scapular retraction, wall walking with shoulder abduction, and hands behind head external rotation.  She was encouraged to do these twice a day, holding 3 seconds and repeating 5 times when permitted by her physician.          PT Education - 10/27/18 1129    Education Details  Lymphedema risk reduction and post op shoulder ROM HEP    Person(s) Educated  Patient;Spouse    Methods  Explanation;Demonstration;Handout    Comprehension  Returned demonstration;Verbalized understanding           Breast Clinic Goals - 10/27/18 1147      Patient will be able to verbalize understanding of pertinent lymphedema risk reduction practices relevant to her  diagnosis specifically related to skin care.   Time  1    Period  Days    Status  Achieved      Patient will be able to return demonstrate and/or verbalize  understanding of the post-op home exercise program related to regaining shoulder range of motion.   Time  1    Period  Days    Status  Achieved      Patient will be able to verbalize understanding of the importance of attending the postoperative After Breast Cancer Class for further lymphedema risk reduction education and therapeutic exercise.   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 It measures 5 cm and is located in the upper outer quadrant. It is ER/PR positive and HER2 negative with a Ki67 of 20%. She has 4 enlarged axillary nodes and 1 biopsied positive node. She will undergo an MRI to determine the extent of disease and then will likely do neoadjuvant chemotherapy or anti-estrogen therapy. This will be followed by a left lumpectomy and targeted node dissection, radiation, and anti-estrogen therapy. She will benefit from post op PT to regain shoulder ROM and function, and be further educated on lymphedema risk reduction practices.    History and Personal Factors relevant to plan of care:  Unknown extent of disease    Clinical Presentation  Stable    Clinical Decision Making  Low    Rehab Potential  Excellent    Clinical Impairments Affecting Rehab Potential  None    PT Frequency  One time visit    PT Treatment/Interventions  ADLs/Self Care Home Management;Patient/family education;Therapeutic exercise    PT Next Visit Plan  Will reassess post op if MD refers pt    PT Home Exercise Plan  Post op shoulder ROM HEP    Consulted and Agree with Plan of Care  Patient;Family member/caregiver    Family Member Consulted  Husband       Patient will benefit from skilled therapeutic intervention in order to improve the following deficits and impairments:  Decreased range of motion, Impaired UE functional use, Pain, Postural  dysfunction, Decreased knowledge of precautions  Visit Diagnosis: Malignant neoplasm of upper-outer quadrant of left breast in female, estrogen receptor positive ([place]) - Plan: PT plan of care cert/re-cert  Abnormal posture - Plan: PT plan of care cert/re-cert   Patient will follow up at outpatient cancer rehab 3-4 weeks following surgery.  If the patient requires physical therapy at that time, a specific plan will be dictated and sent to the referring physician for approval. The patient was educated today on appropriate basic range of motion exercises to begin post operatively and the importance of attending the After Breast Cancer class following surgery.  Patient was educated today on lymphedema risk reduction practices as it pertains to recommendations that will benefit the patient immediately following surgery.  She verbalized good understanding.      Problem List Patient Active Problem List   Diagnosis Date Noted  . Malignant neoplasm of upper-outer quadrant of left breast in female, estrogen receptor positive ([place]) 10/26/2018  . Multiple neurological symptoms 03/31/2017  . Pain in back 02/21/2014  . Female stress incontinence 02/21/2014  . Carbuncle 02/21/2014  . Symptomatic menopausal or female climacteric states 02/21/2014  .  CONGESTIVE HEART FAILURE 03/22/2009  . CHEST PAIN-PRECORDIAL 03/22/2009  . HYPERTENSION 07/31/2008  . ABDOMINAL PAIN, UNSPECIFIED SITE 07/31/2008    Annia Friendly, PT 10/27/18 11:49 AM   [place], [place], [address] Phone: [phone]   Fax:  [phone]  Name: [name] MRN: [id number] Date of Birth: Apr 06, 1961

## 2018-10-27 NOTE — Patient Instructions (Signed)

## 2018-10-27 NOTE — Progress Notes (Signed)
Radiation Oncology         [phone]) [phone] ________________________________  Name: [name]        MRN: [id number]  Date of Service: 10/27/2018 DOB: 02-03-1961  WJ:[id number], Joelene Millin, NP  Erroll Luna, MD     REFERRING PHYSICIAN: Erroll Luna, MD   DIAGNOSIS: The encounter diagnosis was Malignant neoplasm of upper-outer quadrant of left breast in female, estrogen receptor positive ([place]).   HISTORY OF PRESENT ILLNESS: [name] is a 59 y.o. female seen in the multidisciplinary breast clinic for a new diagnosis of left breast cancer. The patient was noted to have screening detected architectural abnormality measuring 4.5 cm in the left breast with a suspicious appearing lymph node. Diagnostic imaging revealed an area measuring 5 cm in the 1:00 position of the left breast with at least 4 abnormal appearing axillary nodes, and one intramammary node that was abnormal in appearance. 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 Her tumor was ER/PR positive, HER2 negative with a Ki 67 of 20%. Her intramammary node was also sampled and negative for cancer. She comes today for discussion of treatment for her cancer and is being set up for an MRI of the breasts.     PREVIOUS RADIATION THERAPY: No   PAST MEDICAL HISTORY:  Past Medical History:  Diagnosis Date  . Fatigue   . GERD (gastroesophageal reflux disease)    w nonobstructing esophageal stricture  . Headache   . Heat intolerance   . History of ETT 6/10   myoview EF 65%, normal wall motion, no ischemia or infarction, poor exercise capacity  . HTN (hypertension)   . Normal echocardiogram 6/10   EF 56-21% moderate diastolic dysfunction, mild LAE  . Numbness and tingling   . Obesity   . S/P partial hysterectomy        PAST SURGICAL HISTORY: Past Surgical History:    Procedure Laterality Date  . FOOT SURGERY    . hysterectomy (other)       FAMILY HISTORY:  Family History  Problem Relation Age of Onset  . Heart attack Mother 36  . Heart disease Mother   . Heart attack Maternal Grandmother        age 61 or 16  . Heart disease Maternal Grandmother   . Neuropathy Neg Hx      SOCIAL HISTORY:  reports that she has never smoked. She has never used smokeless tobacco. She reports that she does not drink alcohol or use drugs. The patient is married and lives in [place]. 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    ALLERGIES: Patient has no known allergies.   MEDICATIONS:  Current Outpatient Medications  Medication Sig Dispense Refill  . ALPRAZolam (XANAX) 0.5 MG tablet Take 1-2 tablets 30-60 minutes before MRI. May take one additional if needed. Do not drive while taking this medication. 3 tablet 0  . aspirin (ASPIR-81) 81 MG EC tablet Take 81 mg by mouth daily.      . carvedilol (COREG) 6.25 MG tablet TAKE 1 TABLET BY MOUTH TWICE DAILY 180 tablet 1  . gabapentin (NEURONTIN) 100 MG capsule Take 1 capsule (100 mg total) by mouth 3 (three) times daily. 90 capsule 11  . ibuprofen (ADVIL,MOTRIN) 600 MG tablet Take 1 tablet (600  mg total) by mouth every 6 (six) hours as needed. (Patient not taking: Reported on 03/31/2017) 30 tablet 0  . lisinopril-hydrochlorothiazide (PRINZIDE,ZESTORETIC) 20-12.5 MG per tablet TAKE 2 TABLETS BY MOUTH DAILY 180 tablet 1   No current facility-administered medications for this visit.    Facility-Administered Medications Ordered in Other Visits  Medication Dose Route Frequency Provider Last Rate Last Dose  . gadopentetate dimeglumine (MAGNEVIST) injection 20 mL  20 mL Intravenous Once PRN Sarina Ill B, MD      . gadopentetate dimeglumine (MAGNEVIST) injection 20 mL  20 mL Intravenous Once PRN Melvenia Beam, MD          REVIEW OF SYSTEMS: On review of systems, the patient reports that she is doing well overall. She denies any chest pain, shortness of breath, cough, fevers, chills, night sweats, unintended weight changes. She denies any bowel or bladder disturbances, and denies abdominal pain, nausea or vomiting. She denies any new musculoskeletal or joint aches or pains. A complete review of systems is obtained and is otherwise negative.     PHYSICAL EXAM:  Wt Readings from Last 3 Encounters:  04/07/17 274 lb (124.3 kg)  03/31/17 277 lb 9.6 oz (125.9 kg)  12/03/16 279 lb 6.4 oz (126.7 kg)   Temp Readings from Last 3 Encounters:  01/12/15 97.9 F (36.6 C)   BP Readings from Last 3 Encounters:  04/07/17 (!) 141/99  03/31/17 (!) 149/99  12/15/16 129/86   Pulse Readings from Last 3 Encounters:  04/07/17 81  03/31/17 85  12/15/16 68     In general this is a well appearing African American female in no acute distress. She is alert and oriented x4 and appropriate throughout the examination. HEENT reveals that the patient is normocephalic, atraumatic. EOMs are intact. Skin is intact without any evidence of gross lesions. Cardiopulmonary assessment is negative for acute distress and she exhibits normal effort. Breast exam is deferred.    ECOG = 0  0 - Asymptomatic (Fully active, able to carry on all predisease activities without restriction)  1 - Symptomatic but completely ambulatory (Restricted in physically strenuous activity but ambulatory and able to carry out work of a light or sedentary nature. For example, light housework, office work)  2 - Symptomatic, <50% in bed during the day (Ambulatory and capable of all self care but unable to carry out any work activities. Up and about more than 50% of waking hours)  3 - Symptomatic, >50% in bed, but not bedbound (Capable of only limited self-care, confined to bed or chair 50% or more of waking hours)  4 - Bedbound (Completely disabled. Cannot  carry on any self-care. Totally confined to bed or chair)  5 - Death   Eustace Pen MM, Creech RH, Tormey DC, et al. [phone]). "Toxicity and response criteria of the [facility]". [place] Oncol. 5 (6): 649-55    LABORATORY DATA:  Lab Results  Component Value Date   WBC 4.9 11/12/2016   HGB 12.6 11/12/2016   HCT 40.5 11/12/2016   MCV 85 11/12/2016   PLT 199 11/12/2016   Lab Results  Component Value Date   NA 143 11/12/2016   K 4.0 11/12/2016   CL 102 11/12/2016   CO2 25 11/12/2016   Lab Results  Component Value Date   ALT 23 07/06/2014   AST 21 07/06/2014   ALKPHOS 72 07/06/2014   BILITOT 0.5 07/06/2014      RADIOGRAPHY: No results found.     IMPRESSION/PLAN:  1. Stage IIA cT2N1M0, G1, ER/PR positive invasive ductal carcinoma of the left breast. Dr. [name] discusses the pathology findings and reviews the nature of node positive breast disease. The consensus from the breast conference includes proceeding with the MRI that's been ordered. She likely will receive neoadjuvant therapy with either antiestrogen therapy versus chemotherapy. She will start antiestrogen therapy today. Her surgical approach is to be determined, but we would recommend adjuvant raditoherapy due to her node positive disease. She's hoping for breast conservation surgery, and we reviewed the recommendations for treamtent to the breast when women undergo lumpectomy as well.  We discussed the risks, benefits, short, and long term effects of radiotherapy, and the patient is interested in proceeding. Dr. [name] discusses the delivery and logistics of radiotherapy and anticipates a course of 6 1/2 weeks of radiotherapy to the breast/chest wall and regional nodes. We will see her back following surgery and anticipate starting treatment 4-6 weeks after surgery.    In a visit lasting 45 minutes, greater than 50% of the time was spent face to face discussing her case, and coordinating the patient's  care.  The above documentation reflects my direct findings during this shared patient visit. Please see the separate note by Dr. [name] on this date for the remainder of the patient's plan of care.    Carola Rhine, PAC

## 2018-10-27 NOTE — Assessment & Plan Note (Signed)
10/22/2018: Screening detected left breast architectural distortion: 4.5 cm, UOQ, by ultrasound measured 5 cm at 1 o'clock position multiple enlarged lymph nodes, biopsy revealed grade 1 IDC with DCIS with lymphovascular invasion, lymph node biopsy positive, intramammary lymph node biopsy negative, ER 100%, PR 100%, Ki-67 20%, HER-2 1+ by IHC, T2 and 1 stage Ib  Recommendation: 1.  Neoadjuvant therapy consideration for neoadjuvant chemo versus neoadjuvant antiestrogen therapy.  We will get a breast MRI to determine how many lymph nodes are truly involved.  If more than 3 lymph nodes are positive then patient will need systemic chemotherapy.  If less than we will give neoadjuvant antiestrogen therapy for 6 months 2.  Followed by mastectomy 3.  Adjuvant radiation 4.  Adjuvant antiestrogen therapy  Treatment plan: We will start the patient on neoadjuvant antiestrogen therapy with anastrozole 1 mg p.o. daily starting today. Anastrozole counseling:We discussed the risks and benefits of anti-estrogen therapy with aromatase inhibitors. These include but not limited to insomnia, hot flashes, mood changes, vaginal dryness, bone density loss, and weight gain. We strongly believe that the benefits far outweigh the risks. Patient understands these risks and consented to starting treatment.   Return to clinic after the breast MRI to discuss the results.

## 2018-10-28 ENCOUNTER — Ambulatory Visit
Admission: RE | Admit: 2018-10-28 | Discharge: 2018-10-28 | Disposition: A | Discharge status: Home / Self Care | Payer: BC Managed Care – PPO | Source: Ambulatory Visit | Attending: Radiology | Admitting: Radiology

## 2018-10-28 ENCOUNTER — Other Ambulatory Visit: Payer: Self-pay

## 2018-10-28 DIAGNOSIS — C50912 Malignant neoplasm of unspecified site of left female breast: Secondary | ICD-10-CM

## 2018-10-28 MED ORDER — ANASTROZOLE 1 MG PO TABS: 1.0000 mg | ORAL_TABLET | Freq: Every day | ORAL | 0 refills | Status: DC

## 2018-10-28 MED ORDER — GADOBUTROL 1 MMOL/ML IV SOLN
10.0000 mL | Freq: Once | INTRAVENOUS | Status: AC | PRN
  Administered 2018-10-28: 10 mL via INTRAVENOUS

## 2018-11-01 ENCOUNTER — Telehealth: Payer: Self-pay | Admitting: *Deleted

## 2018-11-01 ENCOUNTER — Other Ambulatory Visit: Payer: Self-pay | Admitting: Hematology and Oncology

## 2018-11-01 ENCOUNTER — Other Ambulatory Visit: Payer: Self-pay | Admitting: Radiology

## 2018-11-01 ENCOUNTER — Inpatient Hospital Stay: Payer: BC Managed Care – PPO | Admitting: Hematology and Oncology

## 2018-11-01 ENCOUNTER — Other Ambulatory Visit: Payer: Self-pay

## 2018-11-01 DIAGNOSIS — C773 Secondary and unspecified malignant neoplasm of axilla and upper limb lymph nodes: Secondary | ICD-10-CM

## 2018-11-01 DIAGNOSIS — Z170 Estrogen receptor positive status [ER+]: Principal | ICD-10-CM

## 2018-11-01 DIAGNOSIS — Z5111 Encounter for antineoplastic chemotherapy: Secondary | ICD-10-CM | POA: Diagnosis not present

## 2018-11-01 DIAGNOSIS — C50412 Malignant neoplasm of upper-outer quadrant of left female breast: Secondary | ICD-10-CM

## 2018-11-01 DIAGNOSIS — R9389 Abnormal findings on diagnostic imaging of other specified body structures: Secondary | ICD-10-CM

## 2018-11-01 MED ORDER — ONDANSETRON HCL 8 MG PO TABS: 8.0000 mg | ORAL_TABLET | Freq: Two times a day (BID) | ORAL | 1 refills | Status: DC | PRN

## 2018-11-01 MED ORDER — LIDOCAINE-PRILOCAINE 2.5-2.5 % EX CREA: TOPICAL_CREAM | CUTANEOUS | 3 refills | Status: DC

## 2018-11-01 MED ORDER — PROCHLORPERAZINE MALEATE 10 MG PO TABS: 10.0000 mg | ORAL_TABLET | Freq: Four times a day (QID) | ORAL | 1 refills | Status: DC | PRN

## 2018-11-01 MED ORDER — DEXAMETHASONE 4 MG PO TABS: ORAL_TABLET | ORAL | 0 refills | Status: DC

## 2018-11-01 NOTE — Progress Notes (Signed)
START ON PATHWAY REGIMEN - Breast   Dose-Dense AC q14 days:   A cycle is every 14 days:     Doxorubicin      Cyclophosphamide      Pegfilgrastim-[id number]   **Always confirm dose/schedule in your pharmacy ordering system**  Paclitaxel 80 mg/m2 Weekly:   Administer weekly:     Paclitaxel   **Always confirm dose/schedule in your pharmacy ordering system**  Patient Characteristics: Preoperative or Nonsurgical Candidate (Clinical Staging), Neoadjuvant Therapy followed by Surgery, Invasive Disease, Chemotherapy, HER2 Negative/Unknown/Equivocal, ER Positive Therapeutic Status: Preoperative or Nonsurgical Candidate (Clinical Staging) AJCC M Category: cM0 AJCC Grade: G2 Breast Surgical Plan: Neoadjuvant Therapy followed by Surgery ER Status: Positive (+) AJCC 8 Stage Grouping: IIA HER2 Status: Negative (-) AJCC T Category: cT3 AJCC N Category: cN1 PR Status: Positive (+) Intent of Therapy: Curative Intent, Discussed with Patient

## 2018-11-01 NOTE — Telephone Encounter (Signed)
Left vm for pt to return call regarding [place] and MRI results. Contact information provided.

## 2018-11-01 NOTE — Progress Notes (Signed)
Patient Care Team: [name], NP as PCP - General Erroll Luna, MD as Consulting Physician (General Surgery) Nicholas Lose, MD as Consulting Physician (Hematology and Oncology) Kyung Rudd, MD as Consulting Physician (Radiation Oncology)  DIAGNOSIS:  Encounter Diagnosis  Name Primary?  . Malignant neoplasm of upper-outer quadrant of left breast in female, estrogen receptor positive ([place])     SUMMARY OF ONCOLOGIC HISTORY:   Malignant neoplasm of upper-outer quadrant of left breast in female, estrogen receptor positive ([place])   10/22/2018 Initial Diagnosis    Screening detected left breast architectural distortion: 4.5 cm, UOQ, by ultrasound measured 5 cm at 1 o'clock position multiple enlarged lymph nodes, biopsy revealed grade 1 IDC with DCIS with lymphovascular invasion, lymph node biopsy positive, intramammary lymph node biopsy negative, ER 100%, PR 100%, Ki-67 20%, HER-2 1+ by IHC, T2 and 1 stage 2A     CHIEF COMPLIANT: Follow-up to discuss results of breast MRI  INTERVAL HISTORY: [name] is a 37-year with above-mentioned history left breast cancer who underwent a breast MRI over the weekend and is here today to discuss the results.  She was noted to have for axillary lymph nodes.  Because of this she will need chemotherapy and she is here today to discuss neoadjuvant chemotherapy treatment option.  She is accompanied by her husband.  REVIEW OF SYSTEMS:   Constitutional: Denies fevers, chills or abnormal weight loss Eyes: Denies blurriness of vision Ears, nose, mouth, throat, and face: Denies mucositis or sore throat Respiratory: Denies cough, dyspnea or wheezes Cardiovascular: Denies palpitation, chest discomfort Gastrointestinal:  Denies nausea, heartburn or change in bowel habits Skin: Denies abnormal skin rashes Lymphatics: Denies new lymphadenopathy or easy bruising Neurological:Denies numbness, tingling or new weaknesses Behavioral/Psych:  Mood is stable, no new changes  Extremities: No lower extremity edema   All other systems were reviewed with the patient and are negative.  I have reviewed the past medical history, past surgical history, social history and family history with the patient and they are unchanged from previous note.  ALLERGIES:  has No Known Allergies.  MEDICATIONS:  Current Outpatient Medications  Medication Sig Dispense Refill  . ALPRAZolam (XANAX) 0.5 MG tablet Take 1-2 tablets 30-60 minutes before MRI. May take one additional if needed. Do not drive while taking this medication. (Patient not taking: Reported on 10/27/2018) 3 tablet 0  . AMOXICILLIN PO Take 1 tablet by mouth 2 (two) times daily.    Marland Kitchen anastrozole (ARIMIDEX) 1 MG tablet Take 1 tablet (1 mg total) by mouth daily. 30 tablet 0  . aspirin (ASPIR-81) 81 MG EC tablet Take 81 mg by mouth daily.      . carvedilol (COREG) 6.25 MG tablet TAKE 1 TABLET BY MOUTH TWICE DAILY 180 tablet 1  . gabapentin (NEURONTIN) 100 MG capsule Take 1 capsule (100 mg total) by mouth 3 (three) times daily. (Patient not taking: Reported on 10/27/2018) 90 capsule 11  . ibuprofen (ADVIL,MOTRIN) 600 MG tablet Take 1 tablet (600 mg total) by mouth every 6 (six) hours as needed. 30 tablet 0  . lisinopril-hydrochlorothiazide (PRINZIDE,ZESTORETIC) 20-12.5 MG per tablet TAKE 2 TABLETS BY MOUTH DAILY 180 tablet 1  . raNITIdine HCl (RANITIDINE ACID REDUCER PO) Take 1 tablet by mouth daily.     No current facility-administered medications for this visit.    Facility-Administered Medications Ordered in Other Visits  Medication Dose Route Frequency Provider Last Rate Last Dose  . gadopentetate dimeglumine (MAGNEVIST) injection 20 mL  20 mL Intravenous Once  PRN Melvenia Beam, MD      . gadopentetate dimeglumine (MAGNEVIST) injection 20 mL  20 mL Intravenous Once PRN Melvenia Beam, MD        PHYSICAL EXAMINATION: ECOG PERFORMANCE STATUS: 1 - Symptomatic but completely  ambulatory  There were no vitals filed for this visit. There were no vitals filed for this visit.  GENERAL:alert, no distress and comfortable SKIN: skin color, texture, turgor are normal, no rashes or significant lesions EYES: normal, Conjunctiva are pink and non-injected, sclera clear OROPHARYNX:no exudate, no erythema and lips, buccal mucosa, and tongue normal  NECK: supple, thyroid normal size, non-tender, without nodularity LYMPH:  no palpable lymphadenopathy in the cervical, axillary or inguinal LUNGS: clear to auscultation and percussion with normal breathing effort HEART: regular rate & rhythm and no murmurs and no lower extremity edema ABDOMEN:abdomen soft, non-tender and normal bowel sounds MUSCULOSKELETAL:no cyanosis of digits and no clubbing  NEURO: alert & oriented x 3 with fluent speech, no focal motor/sensory deficits EXTREMITIES: No lower extremity edema   LABORATORY DATA:  I have reviewed the data as listed CMP Latest Ref Rng & Units 10/27/2018 11/12/2016 07/06/2014  Glucose 70 - 99 mg/dL 99 91 138(H)  BUN 6 - 20 mg/dL '17 12 12  '$ Creatinine 0.44 - 1.00 mg/dL 0.87 0.86 0.97  Sodium 135 - 145 mmol/L 141 143 136  Potassium 3.5 - 5.1 mmol/L 4.0 4.0 3.3(L)  Chloride 98 - 111 mmol/L 108 102 103  CO2 22 - 32 mmol/L '22 25 25  '$ Calcium 8.9 - 10.3 mg/dL 9.2 9.6 8.6  Total Protein 6.5 - 8.1 g/dL 7.3 7.2 7.6  Total Bilirubin 0.3 - 1.2 mg/dL 0.5 - 0.5  Alkaline Phos 38 - 126 U/L 68 - 72  AST 15 - 41 U/L 14(L) - 21  ALT 0 - 44 U/L 15 - 23    Lab Results  Component Value Date   WBC 6.4 10/27/2018   HGB 12.5 10/27/2018   HCT 40.1 10/27/2018   MCV 86.8 10/27/2018   PLT 185 10/27/2018   NEUTROABS 4.0 10/27/2018    ASSESSMENT & PLAN:  Malignant neoplasm of upper-outer quadrant of left breast in female, estrogen receptor positive ([place]) 10/22/2018: Screening detected left breast architectural distortion: 4.5 cm, UOQ, by ultrasound measured 5 cm at 1 o'clock position multiple  enlarged lymph nodes, biopsy revealed grade 1 IDC with DCIS with lymphovascular invasion, lymph node biopsy positive, intramammary lymph node biopsy negative, ER 100%, PR 100%, Ki-67 20%, HER-2 1+ by IHC, T2 and 1 stage Ib  10/28/2018: Breast MRI: Non-mass enhancement with architectural distortion UOQ left breast 5 x 3 x 10 cm, for enlarged level 1 axillary lymph nodes.  Treatment plan: 1.  Neoadjuvant chemotherapy with dose dense Adriamycin and Cytoxan every 2 weeks x4 followed by Taxol weekly x12 2.  Followed by mastectomy 3.  Followed by radiation 4.  Followed by antiestrogen therapy.  Plan: 1.  Port placement 2.  Echocardiogram 3.  Chemo class  Chemotherapy Counseling: I discussed the risks and benefits of chemotherapy including the risks of nausea/ vomiting, risk of infection from low WBC count, fatigue due to chemo or anemia, bruising or bleeding due to low platelets, mouth sores, loss/ change in taste and decreased appetite. Liver and kidney function will be monitored through out chemotherapy as abnormalities in liver and kidney function may be a side effect of treatment. Cardiac dysfunction due to Adriamycin were discussed in detail. Risk of permanent bone marrow dysfunction and leukemia due  to chemo were also discussed.  Start chemotherapy in 1 to 2 weeks.  No orders of the defined types were placed in this encounter.  The patient has a good understanding of the overall plan. she agrees with it. she will call with any problems that may develop before the next visit here.   Harriette Ohara, MD 11/01/18

## 2018-11-01 NOTE — Telephone Encounter (Signed)
Gave pt MRI results and Dr. Lindi Adie recommendations based on 4 LN enlarged on MRI - discussed now chemo is recommended based on these findings. Pt scheduled and confirmed to see Dr. Lindi Adie on 11/01/18 at 2:30

## 2018-11-01 NOTE — Assessment & Plan Note (Signed)
10/22/2018: Screening detected left breast architectural distortion: 4.5 cm, UOQ, by ultrasound measured 5 cm at 1 o'clock position multiple enlarged lymph nodes, biopsy revealed grade 1 IDC with DCIS with lymphovascular invasion, lymph node biopsy positive, intramammary lymph node biopsy negative, ER 100%, PR 100%, Ki-67 20%, HER-2 1+ by IHC, T2 and 1 stage Ib  10/28/2018: Breast MRI: Non-mass enhancement with architectural distortion UOQ left breast 5 x 3 x 10 cm, for enlarged level 1 axillary lymph nodes.  Recommendation: 1.  Neoadjuvant chemotherapy with dose dense Adriamycin and Cytoxan every 2 weeks x4 followed by Taxol weekly x12 2.  Followed by mastectomy 3.  Followed by radiation 4.  Followed by antiestrogen therapy.  Plan: 1.  Port placement 2.  Echocardiogram 3.  Chemo class  Start chemotherapy in 1 to 2 weeks.

## 2018-11-02 ENCOUNTER — Other Ambulatory Visit: Payer: Self-pay

## 2018-11-02 ENCOUNTER — Telehealth: Payer: Self-pay

## 2018-11-02 NOTE — Telephone Encounter (Signed)
Called pt to remind her of her upcoming echo appt this Friday. Pt works as a Pharmacist, hospital in [employer] and would like to have all her appts before noon. Was trying to coordinate appt for chemo education class to coincide with echo appt in the morning, but no availability at this time. Provided pt with possible dates of chemo education availabilities, as well as open slots for echo. Pt to call back and update on when she would be available to get echo/chemo class done.

## 2018-11-03 ENCOUNTER — Ambulatory Visit
Admission: RE | Admit: 2018-11-03 | Discharge: 2018-11-03 | Disposition: A | Discharge status: Home / Self Care | Payer: BC Managed Care – PPO | Source: Ambulatory Visit | Attending: Radiology | Admitting: Radiology

## 2018-11-03 ENCOUNTER — Ambulatory Visit: Payer: Self-pay | Admitting: Surgery

## 2018-11-03 DIAGNOSIS — R9389 Abnormal findings on diagnostic imaging of other specified body structures: Secondary | ICD-10-CM

## 2018-11-03 MED ORDER — GADOBUTROL 1 MMOL/ML IV SOLN
10.0000 mL | Freq: Once | INTRAVENOUS | Status: AC | PRN
  Administered 2018-11-03: 10 mL via INTRAVENOUS

## 2018-11-04 ENCOUNTER — Encounter: Payer: Self-pay | Admitting: *Deleted

## 2018-11-04 ENCOUNTER — Telehealth: Payer: Self-pay | Admitting: Hematology and Oncology

## 2018-11-04 NOTE — Telephone Encounter (Signed)
Scheduled appt per 1/22 sch message - pt is aware of first treatment

## 2018-11-05 ENCOUNTER — Inpatient Hospital Stay: Payer: BC Managed Care – PPO

## 2018-11-05 ENCOUNTER — Ambulatory Visit (HOSPITAL_COMMUNITY)
Admission: RE | Admit: 2018-11-05 | Discharge: 2018-11-05 | Disposition: A | Discharge status: Home / Self Care | Payer: BC Managed Care – PPO | Source: Ambulatory Visit | Attending: Hematology and Oncology | Admitting: Hematology and Oncology

## 2018-11-05 DIAGNOSIS — I371 Nonrheumatic pulmonary valve insufficiency: Secondary | ICD-10-CM | POA: Insufficient documentation

## 2018-11-05 DIAGNOSIS — K219 Gastro-esophageal reflux disease without esophagitis: Secondary | ICD-10-CM | POA: Insufficient documentation

## 2018-11-05 DIAGNOSIS — C50412 Malignant neoplasm of upper-outer quadrant of left female breast: Secondary | ICD-10-CM | POA: Diagnosis not present

## 2018-11-05 DIAGNOSIS — I10 Essential (primary) hypertension: Secondary | ICD-10-CM | POA: Diagnosis not present

## 2018-11-05 DIAGNOSIS — I071 Rheumatic tricuspid insufficiency: Secondary | ICD-10-CM | POA: Insufficient documentation

## 2018-11-05 DIAGNOSIS — Z170 Estrogen receptor positive status [ER+]: Secondary | ICD-10-CM

## 2018-11-05 NOTE — Progress Notes (Signed)
  Echocardiogram 2D Echocardiogram has been performed.  Darlina Sicilian M 11/05/2018, 11:57 AM

## 2018-11-08 ENCOUNTER — Other Ambulatory Visit: Payer: BC Managed Care – PPO

## 2018-11-08 ENCOUNTER — Other Ambulatory Visit: Payer: Self-pay

## 2018-11-08 ENCOUNTER — Encounter (HOSPITAL_COMMUNITY): Payer: Self-pay | Admitting: *Deleted

## 2018-11-09 MED ORDER — DEXTROSE 5 % IV SOLN
3.0000 g | INTRAVENOUS | Status: AC
  Administered 2018-11-10: 3 g via INTRAVENOUS
  Filled 2018-11-09: qty 3

## 2018-11-09 NOTE — Progress Notes (Signed)
Patient Care Team: [name], NP as PCP - General Erroll Luna, MD as Consulting Physician (General Surgery) Nicholas Lose, MD as Consulting Physician (Hematology and Oncology) Kyung Rudd, MD as Consulting Physician (Radiation Oncology)  DIAGNOSIS:    ICD-10-CM   1. Malignant neoplasm of upper-outer quadrant of left breast in female, estrogen receptor positive ([place]) C50.412    Z17.0     SUMMARY OF ONCOLOGIC HISTORY:   Malignant neoplasm of upper-outer quadrant of left breast in female, estrogen receptor positive ([place])   10/22/2018 Initial Diagnosis    Screening detected left breast architectural distortion: 4.5 cm, UOQ, by ultrasound measured 5 cm at 1 o'clock position multiple enlarged lymph nodes, biopsy revealed grade 1 IDC with DCIS with lymphovascular invasion, lymph node biopsy positive, intramammary lymph node biopsy negative, ER 100%, PR 100%, Ki-67 20%, HER-2 1+ by IHC, T2 and 1 stage 2A    10/27/2018 Cancer Staging    Staging form: Breast, AJCC 8th Edition - Clinical: Stage IIA (cT2, cN1(f), cM0, G1, ER+, PR+, HER2-) - Signed by Nicholas Lose, MD on 11/01/2018    11/11/2018 -  Chemotherapy    The patient had DOXOrubicin (ADRIAMYCIN) chemo injection 156 mg, 60 mg/m2 = 156 mg, Intravenous,  Once, 0 of 4 cycles palonosetron (ALOXI) injection 0.25 mg, 0.25 mg, Intravenous,  Once, 0 of 4 cycles pegfilgrastim-cbqv (UDENYCA) injection 6 mg, 6 mg, Subcutaneous, Once, 0 of 4 cycles cyclophosphamide (CYTOXAN) 1,560 mg in sodium chloride 0.9 % 250 mL chemo infusion, 600 mg/m2 = 1,560 mg, Intravenous,  Once, 0 of 4 cycles PACLitaxel (TAXOL) 210 mg in sodium chloride 0.9 % 250 mL chemo infusion (</= 67m/m2), 80 mg/m2 = 210 mg, Intravenous,  Once, 0 of 12 cycles fosaprepitant (EMEND) 150 mg, dexamethasone (DECADRON) 12 mg in sodium chloride 0.9 % 145 mL IVPB, , Intravenous,  Once, 0 of 4 cycles  for chemotherapy treatment.      CHIEF COMPLIANT: Cycle 1 Day 1 Adriamycin and  Cytoxan  INTERVAL HISTORY: [name] a 58y.o. with above-mentioned history of left breast cancer. She presents to the clinic today with her husband to begin neoadjuvant chemotherapy with dose dense Adriamycin and Cytoxan. Her port was inserted by Dr. CBrantley Stageyesterday. An ECHO from 11/05/18 showed an ejection fraction in the range of 55-60%. She took ibuprofen yesterday for pain at her port site but will take tylenol of needed from now on.   REVIEW OF SYSTEMS:   Constitutional: Denies fevers, chills or abnormal weight loss Eyes: Denies blurriness of vision Ears, nose, mouth, throat, and face: Denies mucositis or sore throat Respiratory: Denies cough, dyspnea or wheezes Cardiovascular: Denies palpitation, chest discomfort Gastrointestinal:  Denies nausea, heartburn or change in bowel habits Skin: Denies abnormal skin rashes Lymphatics: Denies new lymphadenopathy or easy bruising Neurological: Denies numbness, tingling or new weaknesses Behavioral/Psych: Mood is stable, no new changes  Extremities: No lower extremity edema Breast: denies any pain or lumps or nodules in either breasts All other systems were reviewed with the patient and are negative.  I have reviewed the past medical history, past surgical history, social history and family history with the patient and they are unchanged from previous note.  ALLERGIES:  has No Known Allergies.  MEDICATIONS:  Current Outpatient Medications  Medication Sig Dispense Refill  . carvedilol (COREG) 6.25 MG tablet TAKE 1 TABLET BY MOUTH TWICE DAILY (Patient taking differently: Take 6.25 mg by mouth 2 (two) times daily with a meal. ) 180 tablet 1  .  dexamethasone (DECADRON) 4 MG tablet Take 1 tablet day after chemo and 1 tablet 2 days after chemo with food 8 tablet 0  . ibuprofen (ADVIL,MOTRIN) 600 MG tablet Take 1 tablet (600 mg total) by mouth every 6 (six) hours as needed. (Patient taking differently: Take 600 mg by mouth every  6 (six) hours as needed for headache or moderate pain. ) 30 tablet 0  . ibuprofen (ADVIL,MOTRIN) 800 MG tablet Take 1 tablet (800 mg total) by mouth every 8 (eight) hours as needed. 30 tablet 0  . lidocaine-prilocaine (EMLA) cream Apply to affected area once 30 g 3  . lisinopril-hydrochlorothiazide (PRINZIDE,ZESTORETIC) 20-12.5 MG per tablet TAKE 2 TABLETS BY MOUTH DAILY (Patient taking differently: Take 1 tablet by mouth daily. ) 180 tablet 1  . ondansetron (ZOFRAN) 8 MG tablet Take 1 tablet (8 mg total) by mouth 2 (two) times daily as needed. Start on the third day after chemotherapy. 30 tablet 1  . oxyCODONE (OXY IR/ROXICODONE) 5 MG immediate release tablet Take 1 tablet (5 mg total) by mouth every 6 (six) hours as needed for severe pain. 12 tablet 0  . prochlorperazine (COMPAZINE) 10 MG tablet Take 1 tablet (10 mg total) by mouth every 6 (six) hours as needed (Nausea or vomiting). 30 tablet 1  . ranitidine (ZANTAC) 150 MG tablet Take 150 mg by mouth daily as needed for heartburn.     No current facility-administered medications for this visit.    Facility-Administered Medications Ordered in Other Visits  Medication Dose Route Frequency Provider Last Rate Last Dose  . gadopentetate dimeglumine (MAGNEVIST) injection 20 mL  20 mL Intravenous Once PRN Melvenia Beam, MD      . gadopentetate dimeglumine (MAGNEVIST) injection 20 mL  20 mL Intravenous Once PRN Melvenia Beam, MD        PHYSICAL EXAMINATION: ECOG PERFORMANCE STATUS: 0 - Asymptomatic  Vitals:   11/11/18 0825  BP: 114/72  Pulse: 68  Resp: 18  Temp: 97.8 F (36.6 C)  SpO2: 99%   Filed Weights   11/11/18 0825  Weight: 131.2 kg    GENERAL: alert, no distress and comfortable SKIN: skin color, texture, turgor are normal, no rashes or significant lesions EYES: normal, Conjunctiva are pink and non-injected, sclera clear OROPHARYNX: no exudate, no erythema and lips, buccal mucosa, and tongue normal  NECK: supple, thyroid  normal size, non-tender, without nodularity LYMPH: no palpable lymphadenopathy in the cervical, axillary or inguinal LUNGS: clear to auscultation and percussion with normal breathing effort HEART: regular rate & rhythm and no murmurs and no lower extremity edema ABDOMEN: abdomen soft, non-tender and normal bowel sounds MUSCULOSKELETAL: no cyanosis of digits and no clubbing  NEURO: alert & oriented x 3 with fluent speech, no focal motor/sensory deficits EXTREMITIES: No lower extremity edema  LABORATORY DATA:  I have reviewed the data as listed CMP Latest Ref Rng & Units 10/27/2018 11/12/2016 07/06/2014  Glucose 70 - 99 mg/dL 99 91 138(H)  BUN 6 - 20 mg/dL _0 Creatinine 0.44 - 1.00 mg/dL 0.87 0.86 0.97  Sodium 135 - 145 mmol/L 141 143 136  Potassium 3.5 - 5.1 mmol/L 4.0 4.0 3.3(L)  Chloride 98 - 111 mmol/L 108 102 103  CO2 22 - 32 mmol/L _1 Calcium 8.9 - 10.3 mg/dL 9.2 9.6 8.6  Total Protein 6.5 - 8.1 g/dL 7.3 7.2 7.6  Total Bilirubin 0.3 - 1.2 mg/dL 0.5 - 0.5  Alkaline Phos 38 - 126 U/L 68 - 72  AST 15 - 41 U/L 14(L) - 21  ALT 0 - 44 U/L 15 - 23    Lab Results  Component Value Date   WBC 6.4 10/27/2018   HGB 12.5 10/27/2018   HCT 40.1 10/27/2018   MCV 86.8 10/27/2018   PLT 185 10/27/2018   NEUTROABS 4.0 10/27/2018    ASSESSMENT & PLAN:  Malignant neoplasm of upper-outer quadrant of left breast in female, estrogen receptor positive ([place]) 10/22/2018:Screening detected left breast architectural distortion: 4.5 cm, UOQ, by ultrasound measured 5 cm at 1 o'clock position multiple enlarged lymph nodes, biopsy revealed grade 1 IDC with DCIS with lymphovascular invasion, lymph node biopsy positive, intramammary lymph node biopsy negative, ER 100%, PR 100%, Ki-67 20%, HER-2 1+ by IHC, T2 and 1 stage Ib  10/28/2018: Breast MRI: Non-mass enhancement with architectural distortion UOQ left breast 5 x 3 x 10 cm, 4 enlarged level 1 axillary lymph nodes.  11/01/2018: MRI biopsy  left breast IDC grade 1, ER PR positive HER-2 negative  Treatment plan: 1.  Neoadjuvant chemotherapy with dose dense Adriamycin and Cytoxan every 2 weeks x4 followed by Taxol weekly x12 2.  Followed by mastectomy 3.  Followed by radiation 4.  Followed by antiestrogen therapy. ----------------------------------------------------------------------------------------------------------------------------------------------------- Current treatment: Cycle 1 day 1 dose dense Adriamycin and Cytoxan Echocardiogram 11/05/2018: EF 55 to 60% Labs reviewed Chemo consent obtained Chemo education completed Monitoring closely for toxicities  Return to clinic in 1 week for toxicity check    No orders of the defined types were placed in this encounter.  The patient has a good understanding of the overall plan. she agrees with it. she will call with any problems that may develop before the next visit here.  Nicholas Lose, MD 11/11/2018  Julious Oka Dorshimer am acting as scribe for Dr. Nicholas Lose.  I have reviewed the above documentation for accuracy and completeness, and I agree with the above.

## 2018-11-09 NOTE — Anesthesia Preprocedure Evaluation (Addendum)
Anesthesia Evaluation  Patient identified by MRN, date of birth, ID band Patient awake    Reviewed: Allergy & Precautions, NPO status , Patient's Chart, lab work & pertinent test results, reviewed documented beta blocker date and time   History of Anesthesia Complications (+) PONV  Airway Mallampati: II  TM Distance: >3 FB Neck ROM: Full    Dental no notable dental hx.    Pulmonary neg pulmonary ROS,    Pulmonary exam normal breath sounds clear to auscultation       Cardiovascular hypertension, Pt. on medications and Pt. on home beta blockers negative cardio ROS Normal cardiovascular exam Rhythm:Regular Rate:Normal     Neuro/Psych  Headaches, negative psych ROS   GI/Hepatic Neg liver ROS, GERD  ,  Endo/Other  negative endocrine ROS  Renal/GU negative Renal ROS  negative genitourinary   Musculoskeletal negative musculoskeletal ROS (+)   Abdominal (+) + obese,   Peds negative pediatric ROS (+)  Hematology negative hematology ROS (+)   Anesthesia Other Findings Breast Cancer  Reproductive/Obstetrics negative OB ROS                           Anesthesia Physical Anesthesia Plan  ASA: III  Anesthesia Plan: General   Post-op Pain Management:    Induction: Intravenous  PONV Risk Score and Plan: 4 or greater and Ondansetron, Dexamethasone, Midazolam and Scopolamine patch - Pre-op  Airway Management Planned: LMA  Additional Equipment:   Intra-op Plan:   Post-operative Plan: Extubation in OR  Informed Consent: I have reviewed the patients History and Physical, chart, labs and discussed the procedure including the risks, benefits and alternatives for the proposed anesthesia with the patient or authorized representative who has indicated his/her understanding and acceptance.     Dental advisory given  Plan Discussed with: CRNA  Anesthesia Plan Comments: (Nuclear stress 04/03/2009  EF:  65%, Normal wall motion, no significant ST segment change suggestive of ischemia, normal stress nuclear study. Echo 11/05/2018: EF 55%-60%, wall motion normal, mild TR.)       Anesthesia Quick Evaluation

## 2018-11-10 ENCOUNTER — Encounter (HOSPITAL_COMMUNITY): Payer: Self-pay

## 2018-11-10 ENCOUNTER — Ambulatory Visit (HOSPITAL_COMMUNITY): Payer: BC Managed Care – PPO

## 2018-11-10 ENCOUNTER — Other Ambulatory Visit: Payer: Self-pay

## 2018-11-10 ENCOUNTER — Ambulatory Visit (HOSPITAL_COMMUNITY): Payer: BC Managed Care – PPO | Admitting: Physician Assistant

## 2018-11-10 ENCOUNTER — Ambulatory Visit (HOSPITAL_COMMUNITY)
Admission: RE | Admit: 2018-11-10 | Discharge: 2018-11-10 | Disposition: A | Discharge status: Home / Self Care | Payer: BC Managed Care – PPO | Attending: Surgery | Admitting: Surgery

## 2018-11-10 ENCOUNTER — Encounter (HOSPITAL_COMMUNITY)
Admission: RE | Disposition: A | Discharge status: Home / Self Care | Payer: Self-pay | Source: Home / Self Care | Attending: Surgery

## 2018-11-10 ENCOUNTER — Encounter: Payer: Self-pay | Admitting: Hematology and Oncology

## 2018-11-10 DIAGNOSIS — K219 Gastro-esophageal reflux disease without esophagitis: Secondary | ICD-10-CM | POA: Insufficient documentation

## 2018-11-10 DIAGNOSIS — I10 Essential (primary) hypertension: Secondary | ICD-10-CM | POA: Insufficient documentation

## 2018-11-10 DIAGNOSIS — Z79899 Other long term (current) drug therapy: Secondary | ICD-10-CM | POA: Diagnosis not present

## 2018-11-10 DIAGNOSIS — Z95828 Presence of other vascular implants and grafts: Secondary | ICD-10-CM

## 2018-11-10 DIAGNOSIS — Z170 Estrogen receptor positive status [ER+]: Secondary | ICD-10-CM | POA: Diagnosis not present

## 2018-11-10 DIAGNOSIS — C50912 Malignant neoplasm of unspecified site of left female breast: Secondary | ICD-10-CM | POA: Insufficient documentation

## 2018-11-10 DIAGNOSIS — J90 Pleural effusion, not elsewhere classified: Secondary | ICD-10-CM | POA: Diagnosis not present

## 2018-11-10 DIAGNOSIS — Z419 Encounter for procedure for purposes other than remedying health state, unspecified: Secondary | ICD-10-CM

## 2018-11-10 HISTORY — DX: Adverse effect of unspecified anesthetic, initial encounter: T41.45XA

## 2018-11-10 HISTORY — DX: Motion sickness, initial encounter: T75.3XXA

## 2018-11-10 HISTORY — DX: Nausea with vomiting, unspecified: R11.2

## 2018-11-10 HISTORY — DX: Malignant (primary) neoplasm, unspecified: C80.1

## 2018-11-10 HISTORY — DX: Other specified postprocedural states: Z98.890

## 2018-11-10 HISTORY — DX: Other complications of anesthesia, initial encounter: T88.59XA

## 2018-11-10 HISTORY — PX: PORTACATH PLACEMENT: SHX2246

## 2018-11-10 SURGERY — INSERTION, TUNNELED CENTRAL VENOUS DEVICE, WITH PORT
Anesthesia: General

## 2018-11-10 MED ORDER — EPINEPHRINE PF 1 MG/ML IJ SOLN
INTRAMUSCULAR | Status: AC
  Filled 2018-11-10: qty 1

## 2018-11-10 MED ORDER — LIDOCAINE-EPINEPHRINE 1 %-1:100000 IJ SOLN
INTRAMUSCULAR | Status: AC
  Filled 2018-11-10: qty 1

## 2018-11-10 MED ORDER — MIDAZOLAM HCL 5 MG/5ML IJ SOLN
INTRAMUSCULAR | Status: DC | PRN
  Administered 2018-11-10: 2 mg via INTRAVENOUS

## 2018-11-10 MED ORDER — LIDOCAINE-EPINEPHRINE 2 %-1:100000 IJ SOLN
INTRAMUSCULAR | Status: AC
  Filled 2018-11-10: qty 1

## 2018-11-10 MED ORDER — FENTANYL CITRATE (PF) 250 MCG/5ML IJ SOLN
INTRAMUSCULAR | Status: DC | PRN
  Administered 2018-11-10: 50 ug via INTRAVENOUS

## 2018-11-10 MED ORDER — ROCURONIUM BROMIDE 50 MG/5ML IV SOSY
PREFILLED_SYRINGE | INTRAVENOUS | Status: AC
  Filled 2018-11-10: qty 5

## 2018-11-10 MED ORDER — ACETAMINOPHEN 500 MG PO TABS
1000.0000 mg | ORAL_TABLET | ORAL | Status: AC
  Administered 2018-11-10: 1000 mg via ORAL
  Filled 2018-11-10: qty 2

## 2018-11-10 MED ORDER — CHLORHEXIDINE GLUCONATE CLOTH 2 % EX PADS: 6.0000 | MEDICATED_PAD | Freq: Once | CUTANEOUS | Status: DC

## 2018-11-10 MED ORDER — LACTATED RINGERS IV SOLN
INTRAVENOUS | Status: DC | PRN
  Administered 2018-11-10: 07:00:00 via INTRAVENOUS

## 2018-11-10 MED ORDER — FENTANYL CITRATE (PF) 250 MCG/5ML IJ SOLN
INTRAMUSCULAR | Status: AC
  Filled 2018-11-10: qty 5

## 2018-11-10 MED ORDER — DEXAMETHASONE SODIUM PHOSPHATE 10 MG/ML IJ SOLN
INTRAMUSCULAR | Status: DC | PRN
  Administered 2018-11-10: 10 mg via INTRAVENOUS

## 2018-11-10 MED ORDER — CELECOXIB 200 MG PO CAPS
200.0000 mg | ORAL_CAPSULE | ORAL | Status: AC
  Administered 2018-11-10: 200 mg via ORAL
  Filled 2018-11-10: qty 1

## 2018-11-10 MED ORDER — LIDOCAINE HCL (CARDIAC) PF 100 MG/5ML IV SOSY
PREFILLED_SYRINGE | INTRAVENOUS | Status: DC | PRN
  Administered 2018-11-10: 100 mg via INTRAVENOUS

## 2018-11-10 MED ORDER — ONDANSETRON HCL 4 MG/2ML IJ SOLN
INTRAMUSCULAR | Status: AC
  Filled 2018-11-10: qty 2

## 2018-11-10 MED ORDER — PROPOFOL 10 MG/ML IV BOLUS
INTRAVENOUS | Status: AC
  Filled 2018-11-10: qty 20

## 2018-11-10 MED ORDER — PHENYLEPHRINE HCL 10 MG/ML IJ SOLN
INTRAMUSCULAR | Status: DC | PRN
  Administered 2018-11-10 (×2): 200 ug via INTRAVENOUS
  Administered 2018-11-10: 120 ug via INTRAVENOUS
  Administered 2018-11-10: 80 ug via INTRAVENOUS
  Administered 2018-11-10: 200 ug via INTRAVENOUS

## 2018-11-10 MED ORDER — BUPIVACAINE-EPINEPHRINE 0.25% -1:200000 IJ SOLN
INTRAMUSCULAR | Status: DC | PRN
  Administered 2018-11-10: 13 mL

## 2018-11-10 MED ORDER — GABAPENTIN 300 MG PO CAPS
300.0000 mg | ORAL_CAPSULE | ORAL | Status: AC
  Administered 2018-11-10: 300 mg via ORAL
  Filled 2018-11-10: qty 1

## 2018-11-10 MED ORDER — ONDANSETRON HCL 4 MG/2ML IJ SOLN
INTRAMUSCULAR | Status: DC | PRN
  Administered 2018-11-10: 4 mg via INTRAVENOUS

## 2018-11-10 MED ORDER — PROPOFOL 10 MG/ML IV BOLUS
INTRAVENOUS | Status: DC | PRN
  Administered 2018-11-10: 200 mg via INTRAVENOUS

## 2018-11-10 MED ORDER — OXYCODONE HCL 5 MG PO TABS: 5.0000 mg | ORAL_TABLET | Freq: Four times a day (QID) | ORAL | 0 refills | Status: DC | PRN

## 2018-11-10 MED ORDER — IBUPROFEN 800 MG PO TABS: 800.0000 mg | ORAL_TABLET | Freq: Three times a day (TID) | ORAL | 0 refills | Status: DC | PRN

## 2018-11-10 MED ORDER — SUCCINYLCHOLINE CHLORIDE 200 MG/10ML IV SOSY
PREFILLED_SYRINGE | INTRAVENOUS | Status: AC
  Filled 2018-11-10: qty 10

## 2018-11-10 MED ORDER — LIDOCAINE 2% (20 MG/ML) 5 ML SYRINGE
INTRAMUSCULAR | Status: AC
  Filled 2018-11-10: qty 5

## 2018-11-10 MED ORDER — BUPIVACAINE HCL (PF) 0.25 % IJ SOLN
INTRAMUSCULAR | Status: AC
  Filled 2018-11-10: qty 30

## 2018-11-10 MED ORDER — EPHEDRINE SULFATE 50 MG/ML IJ SOLN
INTRAMUSCULAR | Status: DC | PRN
  Administered 2018-11-10 (×5): 10 mg via INTRAVENOUS

## 2018-11-10 MED ORDER — EPINEPHRINE PF 1 MG/10ML IJ SOSY
PREFILLED_SYRINGE | INTRAMUSCULAR | Status: AC
  Filled 2018-11-10: qty 10

## 2018-11-10 MED ORDER — MIDAZOLAM HCL 2 MG/2ML IJ SOLN
INTRAMUSCULAR | Status: AC
  Filled 2018-11-10: qty 2

## 2018-11-10 MED ORDER — SODIUM CHLORIDE 0.9 % IV SOLN
INTRAVENOUS | Status: AC
  Filled 2018-11-10: qty 1.2

## 2018-11-10 MED ORDER — HEPARIN SOD (PORK) LOCK FLUSH 100 UNIT/ML IV SOLN
INTRAVENOUS | Status: DC | PRN
  Administered 2018-11-10: 500 [IU] via INTRAVENOUS

## 2018-11-10 MED ORDER — DEXAMETHASONE SODIUM PHOSPHATE 10 MG/ML IJ SOLN
INTRAMUSCULAR | Status: AC
  Filled 2018-11-10: qty 2

## 2018-11-10 MED ORDER — PHENYLEPHRINE 40 MCG/ML (10ML) SYRINGE FOR IV PUSH (FOR BLOOD PRESSURE SUPPORT)
PREFILLED_SYRINGE | INTRAVENOUS | Status: AC
  Filled 2018-11-10: qty 10

## 2018-11-10 MED ORDER — HEPARIN SOD (PORK) LOCK FLUSH 100 UNIT/ML IV SOLN
INTRAVENOUS | Status: AC
  Filled 2018-11-10: qty 5

## 2018-11-10 MED ORDER — SCOPOLAMINE 1 MG/3DAYS TD PT72
1.0000 | MEDICATED_PATCH | TRANSDERMAL | Status: DC
  Administered 2018-11-10: 1.5 mg via TRANSDERMAL
  Filled 2018-11-10 (×2): qty 1

## 2018-11-10 MED ORDER — EPHEDRINE 5 MG/ML INJ
INTRAVENOUS | Status: AC
  Filled 2018-11-10: qty 10

## 2018-11-10 MED ORDER — SODIUM CHLORIDE 0.9 % IV SOLN
INTRAVENOUS | Status: DC | PRN
  Administered 2018-11-10: 150 mL

## 2018-11-10 MED ORDER — 0.9 % SODIUM CHLORIDE (POUR BTL) OPTIME
TOPICAL | Status: DC | PRN
  Administered 2018-11-10: 1000 mL

## 2018-11-10 SURGICAL SUPPLY — 42 items
BAG DECANTER FOR FLEXI CONT (MISCELLANEOUS) ×5 IMPLANT
CHLORAPREP W/TINT 26ML (MISCELLANEOUS) ×3 IMPLANT
COVER SURGICAL LIGHT HANDLE (MISCELLANEOUS) ×3 IMPLANT
COVER TRANSDUCER ULTRASND GEL (DRAPE) ×3 IMPLANT
COVER WAND RF STERILE (DRAPES) ×1 IMPLANT
CRADLE DONUT ADULT HEAD (MISCELLANEOUS) ×3 IMPLANT
DERMABOND ADVANCED (GAUZE/BANDAGES/DRESSINGS) ×2
DERMABOND ADVANCED .7 DNX12 (GAUZE/BANDAGES/DRESSINGS) ×1 IMPLANT
DRAPE C-ARM 42X72 X-RAY (DRAPES) ×3 IMPLANT
DRAPE LAPAROSCOPIC ABDOMINAL (DRAPES) ×3 IMPLANT
DRSG TEGADERM 4X4.75 (GAUZE/BANDAGES/DRESSINGS) ×2 IMPLANT
ELECT CAUTERY BLADE 6.4 (BLADE) ×3 IMPLANT
ELECT REM PT RETURN 9FT ADLT (ELECTROSURGICAL) ×3
ELECTRODE REM PT RTRN 9FT ADLT (ELECTROSURGICAL) ×1 IMPLANT
GAUZE 4X4 16PLY RFD (DISPOSABLE) ×3 IMPLANT
GAUZE SPONGE 4X4 12PLY STRL LF (GAUZE/BANDAGES/DRESSINGS) ×2 IMPLANT
GEL ULTRASOUND 20GR AQUASONIC (MISCELLANEOUS) IMPLANT
GLOVE BIO SURGEON STRL SZ8 (GLOVE) ×3 IMPLANT
GLOVE BIOGEL PI IND STRL 8 (GLOVE) ×1 IMPLANT
GLOVE BIOGEL PI INDICATOR 8 (GLOVE) ×2
GOWN STRL REUS W/ TWL LRG LVL3 (GOWN DISPOSABLE) ×1 IMPLANT
GOWN STRL REUS W/ TWL XL LVL3 (GOWN DISPOSABLE) ×1 IMPLANT
GOWN STRL REUS W/TWL LRG LVL3 (GOWN DISPOSABLE) ×2
GOWN STRL REUS W/TWL XL LVL3 (GOWN DISPOSABLE) ×2
INTRODUCER COOK 11FR (CATHETERS) IMPLANT
KIT BASIN OR (CUSTOM PROCEDURE TRAY) ×3 IMPLANT
KIT PORT POWER 8FR ISP CVUE (Port) ×2 IMPLANT
KIT TURNOVER KIT B (KITS) ×3 IMPLANT
NS IRRIG 1000ML POUR BTL (IV SOLUTION) ×3 IMPLANT
PAD ARMBOARD 7.5X6 YLW CONV (MISCELLANEOUS) ×3 IMPLANT
PENCIL BUTTON HOLSTER BLD 10FT (ELECTRODE) ×3 IMPLANT
SET INTRODUCER 12FR PACEMAKER (INTRODUCER) IMPLANT
SET SHEATH INTRODUCER 10FR (MISCELLANEOUS) IMPLANT
SHEATH COOK PEEL AWAY SET 9F (SHEATH) IMPLANT
SUT MNCRL AB 4-0 PS2 18 (SUTURE) ×3 IMPLANT
SUT PROLENE 2 0 SH 30 (SUTURE) ×3 IMPLANT
SUT VIC AB 3-0 SH 27 (SUTURE) ×3
SUT VIC AB 3-0 SH 27X BRD (SUTURE) ×1 IMPLANT
SYR 5ML LUER SLIP (SYRINGE) ×3 IMPLANT
TOWEL OR 17X24 6PK STRL BLUE (TOWEL DISPOSABLE) ×3 IMPLANT
TOWEL OR 17X26 10 PK STRL BLUE (TOWEL DISPOSABLE) ×1 IMPLANT
TRAY LAPAROSCOPIC MC (CUSTOM PROCEDURE TRAY) ×3 IMPLANT

## 2018-11-10 NOTE — Progress Notes (Signed)
Calledpt on 11/09/18 to introduce myself as her Arboriculturist and to discuss copay assistance.  She gave me consent to apply in her behalf so I applied to the[employer] Foundationand she was approvedfor $16,000. The standard award is $2,500 and the additional $13,500 is accessible on a first-come, first- served basis as long as funding remains available in the [employer] with a 6 month look back periodfrom 11/09/18.  I also enrolled her in the [employer] Completeprogramfor [employer]for $15,000 for 12 monthsfrom 11/09/18.Pt will pay $0 for her first treatment and $0 for each subsequent treatment (up to $7,200 per treatment).  Pt is above the income requirement for the [employer] so she doesn't qualify at this time.  I will give her my card on 11/11/18 for any questions or concerns she may have in the future.

## 2018-11-10 NOTE — Transfer of Care (Signed)
Immediate Anesthesia Transfer of Care Note  Patient: [name]  Procedure(s) Performed: INSERTION PORT-A-CATH WITH ULTRASOUND (N/A )  Patient Location: PACU  Anesthesia Type:General  Level of Consciousness: awake, alert , oriented and patient cooperative  Airway & Oxygen Therapy: Patient Spontanous Breathing and Patient connected to nasal cannula oxygen  Post-op Assessment: Report given to RN and Post -op Vital signs reviewed and stable  Post vital signs: Reviewed and stable  Last Vitals:  Vitals Value Taken Time  BP 125/89 11/10/2018  8:44 AM  Temp    Pulse 87 11/10/2018  8:45 AM  Resp 12 11/10/2018  8:45 AM  SpO2 100 % 11/10/2018  8:45 AM  Vitals shown include unvalidated device data.  Last Pain:  Vitals:   11/10/18 0602  PainSc: 0-No pain         Complications: No apparent anesthesia complications

## 2018-11-10 NOTE — Op Note (Signed)
Preoperative diagnosis: PAC needed for chemotherapy   Postoperative diagnosis: Same  Procedure: Portacath Placement with  C arm and U/S  Guidance   Surgeon: Turner Daniels, MD, FACS  Anesthesia: General and 0.25 % marcaine with epinephrine  Clinical History and Indications: The patient is getting ready to begin chemotherapy for her cancer. She  needs a Port-A-Cath for venous access. Risk of bleeding, infection,  Collapse lung,  Death,  DVT,  Organ injury,  Mediastinal injury,  Injury to heart,  Injury to blood vessels,  Nerves,  Migration of catheter,  Embolization of catheter and the need for more surgery.  Description of Procedure: I have seen the patient in the holding area and confirmed the plans for the procedure as noted above. I reviewed the risks and complications again and the patient has no further questions. She wishes to proceed.   The patient was then taken to the operating room. After satisfactory general  anesthesia had been obtained the upper chest and lower neck were prepped and draped as a sterile field. The timeout was done.  The right internal jugular vein  was entered under U/S guidance  and the guidewire threaded into the superior vena cava right atrial area under fluoroscopic guidance. An incision was then made on the anterior chest wall and a subcutaneous pocket fashioned for the port reservoir.  The port tubing was then brought through a subcutaneous tunnel from the port site to the guidewire site.  The port and catheter were attached, locked  and flushed. The catheter was measured and cut to appropriate length.The dilator and peel-away sheath were then advanced over the guidewire while monitoring this with fluoroscopy. The guidewire and dilator were removed and the tubing threaded to approximately 20 cm. The peel-away sheath was then removed. The catheter aspirated and flushed easily. Using fluoroscopy the tip was in the superior vena cava right atrial junction area. It  aspirated and flushed easily. That aspirated and flushed easily.   The reservoir was secured to the fascia with 1 sutures of 2-0 Prolene. A final check with fluoroscopy was done to make sure we had no kinks and good positioning of the tip of the catheter. Everything appeared to be okay. The catheter was aspirated, flushed with dilute heparin and then concentrated aqueous heparin.   The port was left accessed. The clamps were applied and caps were tight.    The incision was then closed with interrupted 3-0 Vicryl, and 4-0 Monocryl subcuticular with Dermabond on the skin.  Waterproof bulky dressing applied.    There were no operative complications. Estimated blood loss was minimal. All counts were correct. The patient tolerated the procedure well.  Turner Daniels, MD, FACS

## 2018-11-10 NOTE — Interval H&P Note (Signed)
History and Physical Interval Note:  11/10/2018 7:15 AM  [name]  has presented today for surgery, with the diagnosis of POOR VENOUS ACCESS  The various methods of treatment have been discussed with the patient and family. After consideration of risks, benefits and other options for treatment, the patient has consented to  Procedure(s): INSERTION PORT-A-CATH WITH ULTRASOUND (N/A) as a surgical intervention .  The patient's history has been reviewed, patient examined, no change in status, stable for surgery.  I have reviewed the patient's chart and labs.  Questions were answered to the patient's satisfaction.     [place]

## 2018-11-10 NOTE — Discharge Instructions (Signed)
    PORT-A-CATH: POST OP INSTRUCTIONS  Always review your discharge instruction sheet given to you by the facility where your surgery was performed.   1. A prescription for pain medication may be given to you upon discharge. Take your pain medication as prescribed, if needed. If narcotic pain medicine is not needed, then you make take acetaminophen (Tylenol) or ibuprofen (Advil) as needed.  2. Take your usually prescribed medications unless otherwise directed. 3. If you need a refill on your pain medication, please contact our office. All narcotic pain medicine now requires a paper prescription.  Phoned in and fax refills are no longer allowed by law.  Prescriptions will not be filled after 5 pm or on weekends.  4. You should follow a light diet for the remainder of the day after your procedure. 5. Most patients will experience some mild swelling and/or bruising in the area of the incision. It may take several days to resolve. 6. It is common to experience some constipation if taking pain medication after surgery. Increasing fluid intake and taking a stool softener (such as Colace) will usually help or prevent this problem from occurring. A mild laxative (Milk of Magnesia or Miralax) should be taken according to package directions if there are no bowel movements after 48 hours.  7. Unless discharge instructions indicate otherwise, you may remove your bandages 48 hours after surgery, and you may shower at that time. You may have steri-strips (small white skin tapes) in place directly over the incision.  These strips should be left on the skin for 7-10 days.  If your surgeon used Dermabond (skin glue) on the incision, you may shower in 24 hours.  The glue will flake off over the next 2-3 weeks.  8. If your port is left accessed at the end of surgery (needle left in port), the dressing cannot get wet and should only by changed by a healthcare professional. When the port is no longer accessed (when the  needle has been removed), follow step 7.   9. ACTIVITIES:  Limit activity involving your arms for the next 72 hours. Do no strenuous exercise or activity for 1 week. You may drive when you are no longer taking prescription pain medication, you can comfortably wear a seatbelt, and you can maneuver your car. 10.You may need to see your doctor in the office for a follow-up appointment.  Please       check with your doctor.  11.When you receive a new Port-a-Cath, you will get a product guide and        ID card.  Please keep them in case you need them.  WHEN TO CALL YOUR DOCTOR ([phone]): 1. Fever over 101.0 2. Chills 3. Continued bleeding from incision 4. Increased redness and tenderness at the site 5. Shortness of breath, difficulty breathing   The clinic staff is available to answer your questions during regular business hours. Please don't hesitate to call and ask to speak to one of the nurses or medical assistants for clinical concerns. If you have a medical emergency, go to the nearest emergency room or call 911.  A surgeon from [facility] is always on call at the hospital.     For further information, please visit [email or web address]      

## 2018-11-10 NOTE — Anesthesia Procedure Notes (Signed)
Procedure Name: LMA Insertion Date/Time: 11/10/2018 7:40 AM Performed by: Shirlyn Goltz, CRNA Pre-anesthesia Checklist: Patient identified, Suction available, Patient being monitored and Emergency Drugs available Patient Re-evaluated:Patient Re-evaluated prior to induction Oxygen Delivery Method: Circle system utilized Preoxygenation: Pre-oxygenation with 100% oxygen Induction Type: IV induction LMA: LMA inserted LMA Size: 4.0 Number of attempts: 1 Placement Confirmation: positive ETCO2 and breath sounds checked- equal and bilateral Tube secured with: Tape Dental Injury: Teeth and Oropharynx as per pre-operative assessment

## 2018-11-10 NOTE — Anesthesia Postprocedure Evaluation (Signed)
Anesthesia Post Note  Patient: [name]  Procedure(s) Performed: INSERTION PORT-A-CATH WITH ULTRASOUND (N/A )     Patient location during evaluation: PACU Anesthesia Type: General Level of consciousness: awake and alert Pain management: pain level controlled Vital Signs Assessment: post-procedure vital signs reviewed and stable Respiratory status: spontaneous breathing, nonlabored ventilation and respiratory function stable Cardiovascular status: blood pressure returned to baseline and stable Postop Assessment: no apparent nausea or vomiting Anesthetic complications: no    Last Vitals:  Vitals:   11/10/18 0945 11/10/18 0958  BP:  (!) 131/94  Pulse: 69 90  Resp: 13 14  Temp: (!) 36.1 C   SpO2: 100% 98%    Last Pain:  Vitals:   11/10/18 0945  PainSc: 0-No pain                 Lynda Rainwater

## 2018-11-11 ENCOUNTER — Inpatient Hospital Stay (HOSPITAL_BASED_OUTPATIENT_CLINIC_OR_DEPARTMENT_OTHER): Payer: BC Managed Care – PPO | Admitting: Hematology and Oncology

## 2018-11-11 ENCOUNTER — Inpatient Hospital Stay: Payer: BC Managed Care – PPO

## 2018-11-11 ENCOUNTER — Encounter (HOSPITAL_COMMUNITY): Payer: Self-pay | Admitting: Surgery

## 2018-11-11 ENCOUNTER — Ambulatory Visit: Payer: BC Managed Care – PPO | Admitting: Nurse Practitioner

## 2018-11-11 DIAGNOSIS — C50412 Malignant neoplasm of upper-outer quadrant of left female breast: Secondary | ICD-10-CM

## 2018-11-11 DIAGNOSIS — C773 Secondary and unspecified malignant neoplasm of axilla and upper limb lymph nodes: Secondary | ICD-10-CM | POA: Diagnosis not present

## 2018-11-11 DIAGNOSIS — Z170 Estrogen receptor positive status [ER+]: Principal | ICD-10-CM

## 2018-11-11 DIAGNOSIS — Z5111 Encounter for antineoplastic chemotherapy: Secondary | ICD-10-CM | POA: Diagnosis not present

## 2018-11-11 MED ORDER — HEPARIN SOD (PORK) LOCK FLUSH 100 UNIT/ML IV SOLN
500.0000 [IU] | Freq: Once | INTRAVENOUS | Status: AC | PRN
  Administered 2018-11-11: 500 [IU]
  Filled 2018-11-11: qty 5

## 2018-11-11 MED ORDER — DOXORUBICIN HCL CHEMO IV INJECTION 2 MG/ML
60.0000 mg/m2 | Freq: Once | INTRAVENOUS | Status: AC
  Administered 2018-11-11: 156 mg via INTRAVENOUS
  Filled 2018-11-11: qty 78

## 2018-11-11 MED ORDER — SODIUM CHLORIDE 0.9 % IV SOLN
Freq: Once | INTRAVENOUS | Status: AC
  Administered 2018-11-11: 10:00:00 via INTRAVENOUS
  Filled 2018-11-11: qty 5

## 2018-11-11 MED ORDER — SODIUM CHLORIDE 0.9 % IV SOLN
600.0000 mg/m2 | Freq: Once | INTRAVENOUS | Status: AC
  Administered 2018-11-11: 1560 mg via INTRAVENOUS
  Filled 2018-11-11: qty 78

## 2018-11-11 MED ORDER — PALONOSETRON HCL INJECTION 0.25 MG/5ML
INTRAVENOUS | Status: AC
  Filled 2018-11-11: qty 5

## 2018-11-11 MED ORDER — PALONOSETRON HCL INJECTION 0.25 MG/5ML
0.2500 mg | Freq: Once | INTRAVENOUS | Status: AC
  Administered 2018-11-11: 0.25 mg via INTRAVENOUS

## 2018-11-11 MED ORDER — SODIUM CHLORIDE 0.9% FLUSH
10.0000 mL | INTRAVENOUS | Status: DC | PRN
  Administered 2018-11-11: 10 mL
  Filled 2018-11-11: qty 10

## 2018-11-11 MED ORDER — SODIUM CHLORIDE 0.9 % IV SOLN
Freq: Once | INTRAVENOUS | Status: AC
  Administered 2018-11-11: 09:00:00 via INTRAVENOUS
  Filled 2018-11-11: qty 250

## 2018-11-11 NOTE — Patient Instructions (Signed)
[facility] Discharge Instructions for Patients Receiving Chemotherapy  Today you received the following chemotherapy agents Doxorubicin (ADRIAMYCIN) & Cyclophosphamide (CYTOXAN).  To help prevent nausea and vomiting after your treatment, we encourage you to take your nausea medication as prescribed. DO NOT TAKE ZOFRAN FOR THE NEXT 3 DAYS, TAKE COMPAZINE.   If you develop nausea and vomiting that is not controlled by your nausea medication, call the clinic.   BELOW ARE SYMPTOMS THAT SHOULD BE REPORTED IMMEDIATELY:  *FEVER GREATER THAN 100.5 F  *CHILLS WITH OR WITHOUT FEVER  NAUSEA AND VOMITING THAT IS NOT CONTROLLED WITH YOUR NAUSEA MEDICATION  *UNUSUAL SHORTNESS OF BREATH  *UNUSUAL BRUISING OR BLEEDING  TENDERNESS IN MOUTH AND THROAT WITH OR WITHOUT PRESENCE OF ULCERS  *URINARY PROBLEMS  *BOWEL PROBLEMS  UNUSUAL RASH Items with * indicate a potential emergency and should be followed up as soon as possible.  Feel free to call the clinic should you have any questions or concerns. The clinic phone number is [phone].  Please show the [facility] at check-in to the Emergency Department and triage nurse.  Doxorubicin injection What is this medicine? DOXORUBICIN (dox oh ROO bi sin) is a chemotherapy drug. It is used to treat many kinds of cancer like leukemia, lymphoma, neuroblastoma, sarcoma, and Wilms' tumor. It is also used to treat bladder cancer, breast cancer, lung cancer, ovarian cancer, stomach cancer, and thyroid cancer. This medicine may be used for other purposes; ask your health care provider or pharmacist if you have questions. COMMON BRAND NAME(S): Adriamycin, Adriamycin PFS, Adriamycin RDF, Rubex What should I tell my health care provider before I take this medicine? They need to know if you have any of these conditions: -heart disease -history of low blood counts caused by a medicine -liver disease -recent or ongoing radiation  therapy -an unusual or allergic reaction to doxorubicin, other chemotherapy agents, other medicines, foods, dyes, or preservatives -pregnant or trying to get pregnant -breast-feeding How should I use this medicine? This drug is given as an infusion into a vein. It is administered in a hospital or clinic by a specially trained health care professional. If you have pain, swelling, burning or any unusual feeling around the site of your injection, tell your health care professional right away. Talk to your pediatrician regarding the use of this medicine in children. Special care may be needed. Overdosage: If you think you have taken too much of this medicine contact a poison control center or emergency room at once. NOTE: This medicine is only for you. Do not share this medicine with others. What if I miss a dose? It is important not to miss your dose. Call your doctor or health care professional if you are unable to keep an appointment. What may interact with this medicine? This medicine may interact with the following medications: -6-mercaptopurine -paclitaxel -phenytoin -St. John's Wort -trastuzumab -verapamil This list may not describe all possible interactions. Give your health care provider a list of all the medicines, herbs, non-prescription drugs, or dietary supplements you use. Also tell them if you smoke, drink alcohol, or use illegal drugs. Some items may interact with your medicine. What should I watch for while using this medicine? This drug may make you feel generally unwell. This is not uncommon, as chemotherapy can affect healthy cells as well as cancer cells. Report any side effects. Continue your course of treatment even though you feel ill unless your doctor tells you to stop. There is a maximum amount  of this medicine you should receive throughout your life. The amount depends on the medical condition being treated and your overall health. Your doctor will watch how much of this  medicine you receive in your lifetime. Tell your doctor if you have taken this medicine before. You may need blood work done while you are taking this medicine. Your urine may turn red for a few days after your dose. This is not blood. If your urine is dark or brown, call your doctor. In some cases, you may be given additional medicines to help with side effects. Follow all directions for their use. Call your doctor or health care professional for advice if you get a fever, chills or sore throat, or other symptoms of a cold or flu. Do not treat yourself. This drug decreases your body's ability to fight infections. Try to avoid being around people who are sick. This medicine may increase your risk to bruise or bleed. Call your doctor or health care professional if you notice any unusual bleeding. Talk to your doctor about your risk of cancer. You may be more at risk for certain types of cancers if you take this medicine. Do not become pregnant while taking this medicine or for 6 months after stopping it. Women should inform their doctor if they wish to become pregnant or think they might be pregnant. Men should not father a child while taking this medicine and for 6 months after stopping it. There is a potential for serious side effects to an unborn child. Talk to your health care professional or pharmacist for more information. Do not breast-feed an infant while taking this medicine. This medicine has caused ovarian failure in some women and reduced sperm counts in some men This medicine may interfere with the ability to have a child. Talk with your doctor or health care professional if you are concerned about your fertility. This medicine may cause a decrease in Co-Enzyme Q-10. You should make sure that you get enough Co-Enzyme Q-10 while you are taking this medicine. Discuss the foods you eat and the vitamins you take with your health care professional. What side effects may I notice from receiving this  medicine? Side effects that you should report to your doctor or health care professional as soon as possible: -allergic reactions like skin rash, itching or hives, swelling of the face, lips, or tongue -breathing problems -chest pain -fast or irregular heartbeat -low blood counts - this medicine may decrease the number of white blood cells, red blood cells and platelets. You may be at increased risk for infections and bleeding. -pain, redness, or irritation at site where injected -signs of infection - fever or chills, cough, sore throat, pain or difficulty passing urine -signs of decreased platelets or bleeding - bruising, pinpoint red spots on the skin, black, tarry stools, blood in the urine -swelling of the ankles, feet, hands -tiredness -weakness Side effects that usually do not require medical attention (report to your doctor or health care professional if they continue or are bothersome): -diarrhea -hair loss -mouth sores -nail discoloration or damage -nausea -red colored urine -vomiting This list may not describe all possible side effects. Call your doctor for medical advice about side effects. You may report side effects to FDA at 1-800-FDA-1088. Where should I keep my medicine? This drug is given in a hospital or clinic and will not be stored at home. NOTE: This sheet is a summary. It may not cover all possible information. If you have questions about  this medicine, talk to your doctor, pharmacist, or health care provider.  2019 Elsevier/Gold Standard (2017-05-13 11:01:26)  Cyclophosphamide injection What is this medicine? CYCLOPHOSPHAMIDE (sye kloe FOSS fa mide) is a chemotherapy drug. It slows the growth of cancer cells. This medicine is used to treat many types of cancer like lymphoma, myeloma, leukemia, breast cancer, and ovarian cancer, to name a few. This medicine may be used for other purposes; ask your health care provider or pharmacist if you have questions. COMMON  BRAND NAME(S): Cytoxan, Neosar What should I tell my health care provider before I take this medicine? They need to know if you have any of these conditions: -blood disorders -history of other chemotherapy -infection -kidney disease -liver disease -recent or ongoing radiation therapy -tumors in the bone marrow -an unusual or allergic reaction to cyclophosphamide, other chemotherapy, other medicines, foods, dyes, or preservatives -pregnant or trying to get pregnant -breast-feeding How should I use this medicine? This drug is usually given as an injection into a vein or muscle or by infusion into a vein. It is administered in a hospital or clinic by a specially trained health care professional. Talk to your pediatrician regarding the use of this medicine in children. Special care may be needed. Overdosage: If you think you have taken too much of this medicine contact a poison control center or emergency room at once. NOTE: This medicine is only for you. Do not share this medicine with others. What if I miss a dose? It is important not to miss your dose. Call your doctor or health care professional if you are unable to keep an appointment. What may interact with this medicine? This medicine may interact with the following medications: -amiodarone -amphotericin B -azathioprine -certain antiviral medicines for HIV or AIDS such as protease inhibitors (e.g., indinavir, ritonavir) and zidovudine -certain blood pressure medications such as benazepril, captopril, enalapril, fosinopril, lisinopril, moexipril, monopril, perindopril, quinapril, ramipril, trandolapril -certain cancer medications such as anthracyclines (e.g., daunorubicin, doxorubicin), busulfan, cytarabine, paclitaxel, pentostatin, tamoxifen, trastuzumab -certain diuretics such as chlorothiazide, chlorthalidone, hydrochlorothiazide, indapamide, metolazone -certain medicines that treat or prevent blood clots like warfarin -certain  muscle relaxants such as succinylcholine -cyclosporine -etanercept -indomethacin -medicines to increase blood counts like filgrastim, pegfilgrastim, sargramostim -medicines used as general anesthesia -metronidazole -natalizumab This list may not describe all possible interactions. Give your health care provider a list of all the medicines, herbs, non-prescription drugs, or dietary supplements you use. Also tell them if you smoke, drink alcohol, or use illegal drugs. Some items may interact with your medicine. What should I watch for while using this medicine? Visit your doctor for checks on your progress. This drug may make you feel generally unwell. This is not uncommon, as chemotherapy can affect healthy cells as well as cancer cells. Report any side effects. Continue your course of treatment even though you feel ill unless your doctor tells you to stop. Drink water or other fluids as directed. Urinate often, even at night. In some cases, you may be given additional medicines to help with side effects. Follow all directions for their use. Call your doctor or health care professional for advice if you get a fever, chills or sore throat, or other symptoms of a cold or flu. Do not treat yourself. This drug decreases your body's ability to fight infections. Try to avoid being around people who are sick. This medicine may increase your risk to bruise or bleed. Call your doctor or health care professional if you notice any unusual bleeding. Be  careful brushing and flossing your teeth or using a toothpick because you may get an infection or bleed more easily. If you have any dental work done, tell your dentist you are receiving this medicine. You may get drowsy or dizzy. Do not drive, use machinery, or do anything that needs mental alertness until you know how this medicine affects you. Do not become pregnant while taking this medicine or for 1 year after stopping it. Women should inform their doctor if  they wish to become pregnant or think they might be pregnant. Men should not father a child while taking this medicine and for 4 months after stopping it. There is a potential for serious side effects to an unborn child. Talk to your health care professional or pharmacist for more information. Do not breast-feed an infant while taking this medicine. This medicine may interfere with the ability to have a child. This medicine has caused ovarian failure in some women. This medicine has caused reduced sperm counts in some men. You should talk with your doctor or health care professional if you are concerned about your fertility. If you are going to have surgery, tell your doctor or health care professional that you have taken this medicine. What side effects may I notice from receiving this medicine? Side effects that you should report to your doctor or health care professional as soon as possible: -allergic reactions like skin rash, itching or hives, swelling of the face, lips, or tongue -low blood counts - this medicine may decrease the number of white blood cells, red blood cells and platelets. You may be at increased risk for infections and bleeding. -signs of infection - fever or chills, cough, sore throat, pain or difficulty passing urine -signs of decreased platelets or bleeding - bruising, pinpoint red spots on the skin, black, tarry stools, blood in the urine -signs of decreased red blood cells - unusually weak or tired, fainting spells, lightheadedness -breathing problems -dark urine -dizziness -palpitations -swelling of the ankles, feet, hands -trouble passing urine or change in the amount of urine -weight gain -yellowing of the eyes or skin Side effects that usually do not require medical attention (report to your doctor or health care professional if they continue or are bothersome): -changes in nail or skin color -hair loss -missed menstrual periods -mouth sores -nausea,  vomiting This list may not describe all possible side effects. Call your doctor for medical advice about side effects. You may report side effects to FDA at 1-800-FDA-1088. Where should I keep my medicine? This drug is given in a hospital or clinic and will not be stored at home. NOTE: This sheet is a summary. It may not cover all possible information. If you have questions about this medicine, talk to your doctor, pharmacist, or health care provider.  2019 Elsevier/Gold Standard (2012-08-13 16:22:58)

## 2018-11-11 NOTE — Progress Notes (Signed)
Per Dr. Lindi Adie, okay to use labs from 10/27/2018

## 2018-11-11 NOTE — Assessment & Plan Note (Addendum)
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  11/01/2018: MRI biopsy left breast IDC grade 1, ER PR positive HER-2 negative  Treatment plan: 1.  Neoadjuvant chemotherapy with dose dense Adriamycin and Cytoxan every 2 weeks x4 followed by Taxol weekly x12 2.  Followed by mastectomy 3.  Followed by radiation 4.  Followed by antiestrogen therapy. ----------------------------------------------------------------------------------------------------------------------------------------------------- Current treatment: Cycle 1 day 1 dose dense Adriamycin and Cytoxan Echocardiogram 11/05/2018: EF 55 to 60% Labs reviewed Chemo consent obtained Chemo education completed Monitoring closely for toxicities  Return to clinic in 1 week for toxicity check

## 2018-11-12 ENCOUNTER — Telehealth: Payer: Self-pay

## 2018-11-12 NOTE — Telephone Encounter (Signed)
Spoke with patient to follow up regarding first time chemo on 11/11/2018.  Patient denies any fever.  Reports small amount of nausea.  Nurse encouraged patient to take Compazine.  Pt voiced understanding.  Pt tolerating fluids well.  Nurse answered all questions.    Scheduling message sent regarding pt requesting time change for upcoming appointments.

## 2018-11-13 ENCOUNTER — Telehealth: Payer: Self-pay

## 2018-11-13 ENCOUNTER — Inpatient Hospital Stay: Payer: BC Managed Care – PPO | Attending: Hematology and Oncology

## 2018-11-13 DIAGNOSIS — Z170 Estrogen receptor positive status [ER+]: Secondary | ICD-10-CM | POA: Insufficient documentation

## 2018-11-13 DIAGNOSIS — R5383 Other fatigue: Secondary | ICD-10-CM | POA: Insufficient documentation

## 2018-11-13 DIAGNOSIS — Z5111 Encounter for antineoplastic chemotherapy: Secondary | ICD-10-CM | POA: Insufficient documentation

## 2018-11-13 DIAGNOSIS — R110 Nausea: Secondary | ICD-10-CM | POA: Insufficient documentation

## 2018-11-13 DIAGNOSIS — Z5189 Encounter for other specified aftercare: Secondary | ICD-10-CM | POA: Insufficient documentation

## 2018-11-13 DIAGNOSIS — C773 Secondary and unspecified malignant neoplasm of axilla and upper limb lymph nodes: Secondary | ICD-10-CM | POA: Insufficient documentation

## 2018-11-13 DIAGNOSIS — C50412 Malignant neoplasm of upper-outer quadrant of left female breast: Secondary | ICD-10-CM | POA: Insufficient documentation

## 2018-11-13 MED ORDER — PEGFILGRASTIM-CBQV 6 MG/0.6ML ~~LOC~~ SOSY
PREFILLED_SYRINGE | SUBCUTANEOUS | Status: AC
  Filled 2018-11-13: qty 0.6

## 2018-11-13 NOTE — Telephone Encounter (Signed)
Attempted to call patient at 1:25 to see if she is coming in for 11:45 injection appointment.

## 2018-11-15 ENCOUNTER — Telehealth: Payer: Self-pay | Admitting: *Deleted

## 2018-11-15 NOTE — Progress Notes (Signed)
FMLA successfully faxed to [employer] at [phone]. Mailed copy to patient address on file.

## 2018-11-15 NOTE — Telephone Encounter (Signed)
Pt called to report she missed an appt for Udynica. Per Judson Roch NP, no injection needed at this point, will assess lab values at appt on 2/6. Received verbal understanding.

## 2018-11-16 ENCOUNTER — Encounter: Payer: Self-pay | Admitting: *Deleted

## 2018-11-17 NOTE — Progress Notes (Signed)
Patient Care Team: [name], NP as PCP - General Erroll Luna, MD as Consulting Physician (General Surgery) Nicholas Lose, MD as Consulting Physician (Hematology and Oncology) Kyung Rudd, MD as Consulting Physician (Radiation Oncology)  DIAGNOSIS:    ICD-10-CM   1. Malignant neoplasm of upper-outer quadrant of left breast in female, estrogen receptor positive ([place]) C50.412    Z17.0     SUMMARY OF ONCOLOGIC HISTORY:   Malignant neoplasm of upper-outer quadrant of left breast in female, estrogen receptor positive ([place])   10/22/2018 Initial Diagnosis    Screening detected left breast architectural distortion: 4.5 cm, UOQ, by ultrasound measured 5 cm at 1 o'clock position multiple enlarged lymph nodes, biopsy revealed grade 1 IDC with DCIS with lymphovascular invasion, lymph node biopsy positive, intramammary lymph node biopsy negative, ER 100%, PR 100%, Ki-67 20%, HER-2 1+ by IHC, T2 and 1 stage 2A    10/27/2018 Cancer Staging    Staging form: Breast, AJCC 8th Edition - Clinical: Stage IIA (cT2, cN1(f), cM0, G1, ER+, PR+, HER2-) - Signed by Nicholas Lose, MD on 11/01/2018    11/11/2018 -  Neo-Adjuvant Chemotherapy    Neoadjuvant chemotherapy with dose dense Adriamycin and Cytoxan every 2 weeks x4 followed by Taxol weekly x12     CHIEF COMPLIANT: Cycle 1 Day 8 Adriamycin and Cytoxan  INTERVAL HISTORY: [name] is a 58 y.o. with above-mentioned history of left breast cancer currently on neoadjuvant chemotherapy with dose dense Adriamycin and Cytoxan. She presents to the clinic today with her husband. She tolerated treatment well, but notes severe nausea and fatigue. She denies vomiting. She has worked 3 days this week and has not taken medication since 4 days after treatment. She missed her Udenyca shot on Saturday due to a scheduling miscommunication where she was not told about her appointment. Her labs from today show: WBC 2.4, Hg 10.8, platelets 131.    REVIEW OF SYSTEMS:   Constitutional: Denies fevers, chills or abnormal weight loss (+) fatigue Eyes: Denies blurriness of vision Ears, nose, mouth, throat, and face: Denies mucositis or sore throat Respiratory: Denies cough, dyspnea or wheezes Cardiovascular: Denies palpitation, chest discomfort Gastrointestinal: Denies heartburn or change in bowel habits (+) nausea Skin: Denies abnormal skin rashes Lymphatics: Denies new lymphadenopathy or easy bruising Neurological: Denies numbness, tingling or new weaknesses Behavioral/Psych: Mood is stable, no new changes  Extremities: No lower extremity edema Breast: denies any pain or lumps or nodules in either breasts All other systems were reviewed with the patient and are negative.  I have reviewed the past medical history, past surgical history, social history and family history with the patient and they are unchanged from previous note.  ALLERGIES:  has No Known Allergies.  MEDICATIONS:  Current Outpatient Medications  Medication Sig Dispense Refill  . carvedilol (COREG) 6.25 MG tablet TAKE 1 TABLET BY MOUTH TWICE DAILY (Patient taking differently: Take 6.25 mg by mouth 2 (two) times daily with a meal. ) 180 tablet 1  . dexamethasone (DECADRON) 4 MG tablet Take 1 tablet day after chemo and 1 tablet 2 days after chemo with food 8 tablet 0  . ibuprofen (ADVIL,MOTRIN) 600 MG tablet Take 1 tablet (600 mg total) by mouth every 6 (six) hours as needed. (Patient taking differently: Take 600 mg by mouth every 6 (six) hours as needed for headache or moderate pain. ) 30 tablet 0  . ibuprofen (ADVIL,MOTRIN) 800 MG tablet Take 1 tablet (800 mg total) by mouth every 8 (eight) hours as needed.  30 tablet 0  . lidocaine-prilocaine (EMLA) cream Apply to affected area once 30 g 3  . lisinopril-hydrochlorothiazide (PRINZIDE,ZESTORETIC) 20-12.5 MG per tablet TAKE 2 TABLETS BY MOUTH DAILY (Patient taking differently: Take 1 tablet by mouth daily. ) 180 tablet 1   . ondansetron (ZOFRAN) 8 MG tablet Take 1 tablet (8 mg total) by mouth 2 (two) times daily as needed. Start on the third day after chemotherapy. 30 tablet 1  . oxyCODONE (OXY IR/ROXICODONE) 5 MG immediate release tablet Take 1 tablet (5 mg total) by mouth every 6 (six) hours as needed for severe pain. 12 tablet 0  . prochlorperazine (COMPAZINE) 10 MG tablet Take 1 tablet (10 mg total) by mouth every 6 (six) hours as needed (Nausea or vomiting). 30 tablet 1  . ranitidine (ZANTAC) 150 MG tablet Take 150 mg by mouth daily as needed for heartburn.     No current facility-administered medications for this visit.    Facility-Administered Medications Ordered in Other Visits  Medication Dose Route Frequency Provider Last Rate Last Dose  . gadopentetate dimeglumine (MAGNEVIST) injection 20 mL  20 mL Intravenous Once PRN Sarina Ill B, MD      . gadopentetate dimeglumine (MAGNEVIST) injection 20 mL  20 mL Intravenous Once PRN Sarina Ill B, MD      . sodium chloride flush (NS) 0.9 % injection 10 mL  10 mL Intracatheter PRN Nicholas Lose, MD   10 mL at 11/18/18 1350    PHYSICAL EXAMINATION: ECOG PERFORMANCE STATUS: 1 - Symptomatic but completely ambulatory  Vitals:   11/18/18 1424  BP: 116/78  Pulse: 88  Resp: 17  Temp: 98.5 F (36.9 C)  SpO2: 100%   Filed Weights   11/18/18 1424  Weight: 283 lb 4.8 oz (128.5 kg)    GENERAL: alert, no distress and comfortable SKIN: skin color, texture, turgor are normal, no rashes or significant lesions EYES: normal, Conjunctiva are pink and non-injected, sclera clear OROPHARYNX: no exudate, no erythema and lips, buccal mucosa, and tongue normal  NECK: supple, thyroid normal size, non-tender, without nodularity LYMPH: no palpable lymphadenopathy in the cervical, axillary or inguinal LUNGS: clear to auscultation and percussion with normal breathing effort HEART: regular rate & rhythm and no murmurs and no lower extremity edema ABDOMEN: abdomen  soft, non-tender and normal bowel sounds MUSCULOSKELETAL: no cyanosis of digits and no clubbing  NEURO: alert & oriented x 3 with fluent speech, no focal motor/sensory deficits EXTREMITIES: No lower extremity edema  LABORATORY DATA:  I have reviewed the data as listed CMP Latest Ref Rng & Units 11/18/2018 10/27/2018 11/12/2016  Glucose 70 - 99 mg/dL 93 99 91  BUN 6 - 20 mg/dL _0 Creatinine 0.44 - 1.00 mg/dL 0.82 0.87 0.86  Sodium 135 - 145 mmol/L 139 141 143  Potassium 3.5 - 5.1 mmol/L 3.7 4.0 4.0  Chloride 98 - 111 mmol/L 103 108 102  CO2 22 - 32 mmol/L _1 Calcium 8.9 - 10.3 mg/dL 8.8(L) 9.2 9.6  Total Protein 6.5 - 8.1 g/dL 6.7 7.3 7.2  Total Bilirubin 0.3 - 1.2 mg/dL 0.6 0.5 -  Alkaline Phos 38 - 126 U/L 61 68 -  AST 15 - 41 U/L 13(L) 14(L) -  ALT 0 - 44 U/L 10 15 -    Lab Results  Component Value Date   WBC 2.4 (L) 11/18/2018   HGB 10.8 (L) 11/18/2018   HCT 34.1 (L) 11/18/2018   MCV 85.5 11/18/2018   PLT 131 (L)  11/18/2018   NEUTROABS 1.3 (L) 11/18/2018    ASSESSMENT & PLAN:  Malignant neoplasm of upper-outer quadrant of left breast in female, estrogen receptor positive ([place]) 10/22/2018:Screening detected left breast architectural distortion: 4.5 cm, UOQ, by ultrasound measured 5 cm at 1 o'clock position multiple enlarged lymph nodes, biopsy revealed grade 1 IDC with DCIS with lymphovascular invasion, lymph node biopsy positive, intramammary lymph node biopsy negative, ER 100%, PR 100%, Ki-67 20%, HER-2 1+ by IHC, T2 and 1 stage Ib  10/28/2018: Breast MRI: Non-mass enhancement with architectural distortion UOQ left breast 5 x 3 x 10 cm, 4 enlarged level 1 axillary lymph nodes.  11/01/2018: MRI biopsy left breast IDC grade 1, ER PR positive HER-2 negative  Treatment plan: 1.Neoadjuvant chemotherapy with dose dense Adriamycin and Cytoxan every 2 weeks x4 followed by Taxol weekly x12 2.Followed by mastectomy 3.Followed by radiation 4.Followed by  antiestrogen therapy. ----------------------------------------------------------------------------------------------------------------------------------------------------- Current treatment: Cycle 1 day 8 dose dense Adriamycin and Cytoxan Echocardiogram 11/05/2018: EF 55 to 60%  Chemo toxicities: 1.  Extremely mild nausea Patient missed her Udenyca injection. We will set her up for Granix 480 mcg daily for the next 3 days.  Return to clinic in 1 week for cycle 2   No orders of the defined types were placed in this encounter.  The patient has a good understanding of the overall plan. she agrees with it. she will call with any problems that may develop before the next visit here.  Nicholas Lose, MD 11/18/2018  Julious Oka Dorshimer am acting as scribe for Dr. Nicholas Lose.  I have reviewed the above documentation for accuracy and completeness, and I agree with the above.

## 2018-11-18 ENCOUNTER — Ambulatory Visit: Payer: BC Managed Care – PPO | Admitting: Hematology and Oncology

## 2018-11-18 ENCOUNTER — Telehealth: Payer: Self-pay | Admitting: Hematology and Oncology

## 2018-11-18 ENCOUNTER — Inpatient Hospital Stay (HOSPITAL_BASED_OUTPATIENT_CLINIC_OR_DEPARTMENT_OTHER): Payer: BC Managed Care – PPO | Admitting: Hematology and Oncology

## 2018-11-18 ENCOUNTER — Other Ambulatory Visit: Payer: Self-pay | Admitting: Medical Oncology

## 2018-11-18 ENCOUNTER — Inpatient Hospital Stay: Payer: BC Managed Care – PPO

## 2018-11-18 VITALS — BP 116/78 | HR 88 | Temp 98.5°F | Resp 17 | Ht 74.0 in | Wt 283.3 lb

## 2018-11-18 DIAGNOSIS — R110 Nausea: Secondary | ICD-10-CM | POA: Diagnosis not present

## 2018-11-18 DIAGNOSIS — Z170 Estrogen receptor positive status [ER+]: Secondary | ICD-10-CM | POA: Diagnosis not present

## 2018-11-18 DIAGNOSIS — C50412 Malignant neoplasm of upper-outer quadrant of left female breast: Secondary | ICD-10-CM

## 2018-11-18 DIAGNOSIS — Z5189 Encounter for other specified aftercare: Secondary | ICD-10-CM | POA: Diagnosis not present

## 2018-11-18 DIAGNOSIS — R5383 Other fatigue: Secondary | ICD-10-CM

## 2018-11-18 DIAGNOSIS — C773 Secondary and unspecified malignant neoplasm of axilla and upper limb lymph nodes: Secondary | ICD-10-CM | POA: Diagnosis not present

## 2018-11-18 DIAGNOSIS — Z95828 Presence of other vascular implants and grafts: Secondary | ICD-10-CM

## 2018-11-18 DIAGNOSIS — Z5111 Encounter for antineoplastic chemotherapy: Secondary | ICD-10-CM | POA: Diagnosis present

## 2018-11-18 LAB — CMP (CANCER CENTER ONLY)
ALT: 10 U/L (ref 0–44)
AST: 13 U/L — AB (ref 15–41)
Albumin: 3.4 g/dL — ABNORMAL LOW (ref 3.5–5.0)
Alkaline Phosphatase: 61 U/L (ref 38–126)
Anion gap: 8 (ref 5–15)
BUN: 12 mg/dL (ref 6–20)
CO2: 28 mmol/L (ref 22–32)
Calcium: 8.8 mg/dL — ABNORMAL LOW (ref 8.9–10.3)
Chloride: 103 mmol/L (ref 98–111)
Creatinine: 0.82 mg/dL (ref 0.44–1.00)
GFR, Est AFR Am: >60 mL/min (ref >60)
Glucose, Bld: 93 mg/dL (ref 70–99)
POTASSIUM: 3.7 mmol/L (ref 3.5–5.1)
Sodium: 139 mmol/L (ref 135–145)
Total Bilirubin: 0.6 mg/dL (ref 0.3–1.2)
Total Protein: 6.7 g/dL (ref 6.5–8.1)

## 2018-11-18 LAB — CBC WITH DIFFERENTIAL (CANCER CENTER ONLY)
Abs Immature Granulocytes: 0.05 10*3/uL (ref 0.00–0.07)
Basophils Absolute: 0 10*3/uL (ref 0.0–0.1)
Basophils Relative: 0 %
Eosinophils Absolute: 0 10*3/uL (ref 0.0–0.5)
Eosinophils Relative: 2 %
HCT: 34.1 % — ABNORMAL LOW (ref 36.0–46.0)
Hemoglobin: 10.8 g/dL — ABNORMAL LOW (ref 12.0–15.0)
Immature Granulocytes: 2 %
Lymphocytes Relative: 40 %
Lymphs Abs: 1 10*3/uL (ref 0.7–4.0)
MCH: 27.1 pg (ref 26.0–34.0)
MCHC: 31.7 g/dL (ref 30.0–36.0)
MCV: 85.5 fL (ref 80.0–100.0)
MONO ABS: 0 10*3/uL — AB (ref 0.1–1.0)
Monocytes Relative: 2 %
Neutro Abs: 1.3 10*3/uL — ABNORMAL LOW (ref 1.7–7.7)
Neutrophils Relative %: 54 %
Platelet Count: 131 10*3/uL — ABNORMAL LOW (ref 150–400)
RBC: 3.99 MIL/uL (ref 3.87–5.11)
RDW: 12 % (ref 11.5–15.5)
WBC: 2.4 10*3/uL — AB (ref 4.0–10.5)
nRBC: 0 % (ref 0.0–0.2)

## 2018-11-18 MED ORDER — TBO-FILGRASTIM 480 MCG/0.8ML ~~LOC~~ SOSY
480.0000 ug | PREFILLED_SYRINGE | Freq: Every day | SUBCUTANEOUS | Status: DC
  Administered 2018-11-18: 480 ug via SUBCUTANEOUS

## 2018-11-18 MED ORDER — ALTEPLASE 2 MG IJ SOLR
2.0000 mg | Freq: Once | INTRAMUSCULAR | Status: DC | PRN
  Filled 2018-11-18: qty 2

## 2018-11-18 MED ORDER — SODIUM CHLORIDE 0.9% FLUSH
10.0000 mL | INTRAVENOUS | Status: DC | PRN
  Administered 2018-11-18: 10 mL
  Filled 2018-11-18: qty 10

## 2018-11-18 MED ORDER — HEPARIN SOD (PORK) LOCK FLUSH 100 UNIT/ML IV SOLN
500.0000 [IU] | Freq: Once | INTRAVENOUS | Status: AC | PRN
  Administered 2018-11-18: 500 [IU]
  Filled 2018-11-18: qty 5

## 2018-11-18 MED ORDER — TBO-FILGRASTIM 480 MCG/0.8ML ~~LOC~~ SOSY
PREFILLED_SYRINGE | SUBCUTANEOUS | Status: AC
  Filled 2018-11-18: qty 0.8

## 2018-11-18 NOTE — Telephone Encounter (Signed)
Gave avs and calendar ° °

## 2018-11-18 NOTE — Assessment & Plan Note (Signed)
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  11/01/2018: MRI biopsy left breast IDC grade 1, ER PR positive HER-2 negative  Treatment plan: 1.Neoadjuvant chemotherapy with dose dense Adriamycin and Cytoxan every 2 weeks x4 followed by Taxol weekly x12 2.Followed by mastectomy 3.Followed by radiation 4.Followed by antiestrogen therapy. ----------------------------------------------------------------------------------------------------------------------------------------------------- Current treatment: Cycle 1 day 8 dose dense Adriamycin and Cytoxan Echocardiogram 11/05/2018: EF 55 to 60%  Chemo toxicities:  Return to clinic in 1 week for cycle 2

## 2018-11-19 ENCOUNTER — Other Ambulatory Visit: Payer: Self-pay | Admitting: Hematology and Oncology

## 2018-11-19 ENCOUNTER — Inpatient Hospital Stay: Payer: BC Managed Care – PPO

## 2018-11-19 VITALS — BP 129/77 | HR 94 | Temp 98.0°F | Resp 18

## 2018-11-19 DIAGNOSIS — Z95828 Presence of other vascular implants and grafts: Secondary | ICD-10-CM

## 2018-11-19 DIAGNOSIS — Z5111 Encounter for antineoplastic chemotherapy: Secondary | ICD-10-CM | POA: Diagnosis not present

## 2018-11-19 MED ORDER — PEGFILGRASTIM-CBQV 6 MG/0.6ML ~~LOC~~ SOSY
PREFILLED_SYRINGE | SUBCUTANEOUS | Status: AC
  Filled 2018-11-19: qty 0.6

## 2018-11-19 MED ORDER — TBO-FILGRASTIM 480 MCG/0.8ML ~~LOC~~ SOSY
480.0000 ug | PREFILLED_SYRINGE | Freq: Every day | SUBCUTANEOUS | Status: DC
  Administered 2018-11-19: 480 ug via SUBCUTANEOUS

## 2018-11-20 ENCOUNTER — Inpatient Hospital Stay: Payer: BC Managed Care – PPO

## 2018-11-20 VITALS — BP 113/73 | HR 89 | Temp 98.9°F | Resp 18

## 2018-11-20 DIAGNOSIS — Z95828 Presence of other vascular implants and grafts: Secondary | ICD-10-CM

## 2018-11-20 DIAGNOSIS — Z5111 Encounter for antineoplastic chemotherapy: Secondary | ICD-10-CM | POA: Diagnosis not present

## 2018-11-20 MED ORDER — TBO-FILGRASTIM 480 MCG/0.8ML ~~LOC~~ SOSY
PREFILLED_SYRINGE | SUBCUTANEOUS | Status: AC
  Filled 2018-11-20: qty 0.8

## 2018-11-20 MED ORDER — PEGFILGRASTIM-CBQV 6 MG/0.6ML ~~LOC~~ SOSY
PREFILLED_SYRINGE | SUBCUTANEOUS | Status: AC
  Filled 2018-11-20: qty 0.6

## 2018-11-20 MED ORDER — TBO-FILGRASTIM 480 MCG/0.8ML ~~LOC~~ SOSY
480.0000 ug | PREFILLED_SYRINGE | Freq: Every day | SUBCUTANEOUS | Status: DC
  Administered 2018-11-20: 480 ug via SUBCUTANEOUS

## 2018-11-20 NOTE — Patient Instructions (Signed)
Tbo-Filgrastim injection What is this medicine? TBO-FILGRASTIM (T B O fil GRA stim) is a granulocyte colony-stimulating factor that stimulates the growth of neutrophils, a type of white blood cell important in the body's fight against infection. It is used to reduce the incidence of fever and infection in patients with certain types of cancer who are receiving chemotherapy that affects the bone marrow. This medicine may be used for other purposes; ask your health care provider or pharmacist if you have questions. COMMON BRAND NAME(S): Granix What should I tell my health care provider before I take this medicine? They need to know if you have any of these conditions: -bone scan or tests planned -kidney disease -sickle cell anemia -an unusual or allergic reaction to tbo-filgrastim, filgrastim, pegfilgrastim, other medicines, foods, dyes, or preservatives -pregnant or trying to get pregnant -breast-feeding How should I use this medicine? This medicine is for injection under the skin. If you get this medicine at home, you will be taught how to prepare and give this medicine. Refer to the Instructions for Use that come with your medication packaging. Use exactly as directed. Take your medicine at regular intervals. Do not take your medicine more often than directed. It is important that you put your used needles and syringes in a special sharps container. Do not put them in a trash can. If you do not have a sharps container, call your pharmacist or healthcare provider to get one. Talk to your pediatrician regarding the use of this medicine in children. While this drug may be prescribed for children as young as 1 month of age for selected conditions, precautions do apply. Overdosage: If you think you have taken too much of this medicine contact a poison control center or emergency room at once. NOTE: This medicine is only for you. Do not share this medicine with others. What if I miss a dose? It is  important not to miss your dose. Call your doctor or health care professional if you miss a dose. What may interact with this medicine? This medicine may interact with the following medications: -medicines that may cause a release of neutrophils, such as lithium This list may not describe all possible interactions. Give your health care provider a list of all the medicines, herbs, non-prescription drugs, or dietary supplements you use. Also tell them if you smoke, drink alcohol, or use illegal drugs. Some items may interact with your medicine. What should I watch for while using this medicine? You may need blood work done while you are taking this medicine. What side effects may I notice from receiving this medicine? Side effects that you should report to your doctor or health care professional as soon as possible: -allergic reactions like skin rash, itching or hives, swelling of the face, lips, or tongue -back pain -blood in the urine -dark urine -dizziness -fast heartbeat -feeling faint -shortness of breath or breathing problems -signs and symptoms of infection like fever or chills; cough; or sore throat -signs and symptoms of kidney injury like trouble passing urine or change in the amount of urine -stomach or side pain, or pain at the shoulder -sweating -swelling of the legs, ankles, or abdomen -tiredness Side effects that usually do not require medical attention (report to your doctor or health care professional if they continue or are bothersome): -bone pain -diarrhea -headache -muscle pain -vomiting This list may not describe all possible side effects. Call your doctor for medical advice about side effects. You may report side effects to FDA at   1-800-FDA-1088. Where should I keep my medicine? Keep out of the reach of children. Store in a refrigerator between 2 and 8 degrees C (36 and 46 degrees F). Keep in carton to protect from light. Throw away this medicine if it is left out  of the refrigerator for more than 5 consecutive days. Throw away any unused medicine after the expiration date. NOTE: This sheet is a summary. It may not cover all possible information. If you have questions about this medicine, talk to your doctor, pharmacist, or health care provider.  2019 [employer] Standard (2017-05-19 16:56:18)  

## 2018-11-25 ENCOUNTER — Inpatient Hospital Stay: Payer: BC Managed Care – PPO

## 2018-11-25 ENCOUNTER — Inpatient Hospital Stay (HOSPITAL_BASED_OUTPATIENT_CLINIC_OR_DEPARTMENT_OTHER): Payer: BC Managed Care – PPO | Admitting: Adult Health

## 2018-11-25 ENCOUNTER — Encounter: Payer: Self-pay | Admitting: Adult Health

## 2018-11-25 VITALS — Temp 98.2°F

## 2018-11-25 VITALS — BP 119/83 | HR 87 | Temp 99.4°F | Resp 18 | Ht 74.0 in | Wt 281.7 lb

## 2018-11-25 DIAGNOSIS — Z170 Estrogen receptor positive status [ER+]: Principal | ICD-10-CM

## 2018-11-25 DIAGNOSIS — C773 Secondary and unspecified malignant neoplasm of axilla and upper limb lymph nodes: Secondary | ICD-10-CM | POA: Diagnosis not present

## 2018-11-25 DIAGNOSIS — R509 Fever, unspecified: Secondary | ICD-10-CM | POA: Diagnosis not present

## 2018-11-25 DIAGNOSIS — Z5111 Encounter for antineoplastic chemotherapy: Secondary | ICD-10-CM | POA: Diagnosis not present

## 2018-11-25 DIAGNOSIS — C50412 Malignant neoplasm of upper-outer quadrant of left female breast: Secondary | ICD-10-CM

## 2018-11-25 DIAGNOSIS — Z95828 Presence of other vascular implants and grafts: Secondary | ICD-10-CM

## 2018-11-25 LAB — CMP (CANCER CENTER ONLY)
ALT: 11 U/L (ref 0–44)
AST: 13 U/L — ABNORMAL LOW (ref 15–41)
Albumin: 3.4 g/dL — ABNORMAL LOW (ref 3.5–5.0)
Alkaline Phosphatase: 63 U/L (ref 38–126)
Anion gap: 8 (ref 5–15)
BUN: 13 mg/dL (ref 6–20)
CO2: 29 mmol/L (ref 22–32)
CREATININE: 0.89 mg/dL (ref 0.44–1.00)
Calcium: 9.2 mg/dL (ref 8.9–10.3)
Chloride: 105 mmol/L (ref 98–111)
GFR, Est AFR Am: >60 mL/min (ref >60)
GFR, Estimated: >60 mL/min (ref >60)
Glucose, Bld: 98 mg/dL (ref 70–99)
Potassium: 3.6 mmol/L (ref 3.5–5.1)
Sodium: 142 mmol/L (ref 135–145)
Total Bilirubin: 0.2 mg/dL — ABNORMAL LOW (ref 0.3–1.2)
Total Protein: 6.8 g/dL (ref 6.5–8.1)

## 2018-11-25 LAB — CBC WITH DIFFERENTIAL (CANCER CENTER ONLY)
Abs Immature Granulocytes: 0.94 10*3/uL — ABNORMAL HIGH (ref 0.00–0.07)
Basophils Absolute: 0 10*3/uL (ref 0.0–0.1)
Basophils Relative: 0 %
EOS PCT: 0 %
Eosinophils Absolute: 0 10*3/uL (ref 0.0–0.5)
HCT: 33.2 % — ABNORMAL LOW (ref 36.0–46.0)
Hemoglobin: 10.5 g/dL — ABNORMAL LOW (ref 12.0–15.0)
Immature Granulocytes: 18 %
LYMPHS PCT: 22 %
Lymphs Abs: 1.2 10*3/uL (ref 0.7–4.0)
MCH: 27.4 pg (ref 26.0–34.0)
MCHC: 31.6 g/dL (ref 30.0–36.0)
MCV: 86.7 fL (ref 80.0–100.0)
Monocytes Absolute: 0.8 10*3/uL (ref 0.1–1.0)
Monocytes Relative: 16 %
Neutro Abs: 2.4 10*3/uL (ref 1.7–7.7)
Neutrophils Relative %: 44 %
Platelet Count: 144 10*3/uL — ABNORMAL LOW (ref 150–400)
RBC: 3.83 MIL/uL — ABNORMAL LOW (ref 3.87–5.11)
RDW: 12.4 % (ref 11.5–15.5)
WBC Count: 5.4 10*3/uL (ref 4.0–10.5)
nRBC: 0.7 % — ABNORMAL HIGH (ref 0.0–0.2)

## 2018-11-25 MED ORDER — SODIUM CHLORIDE 0.9% FLUSH
10.0000 mL | INTRAVENOUS | Status: DC | PRN
  Administered 2018-11-25: 10 mL
  Filled 2018-11-25: qty 10

## 2018-11-25 MED ORDER — PALONOSETRON HCL INJECTION 0.25 MG/5ML
INTRAVENOUS | Status: AC
  Filled 2018-11-25: qty 5

## 2018-11-25 MED ORDER — HEPARIN SOD (PORK) LOCK FLUSH 100 UNIT/ML IV SOLN
500.0000 [IU] | Freq: Once | INTRAVENOUS | Status: AC | PRN
  Administered 2018-11-25: 500 [IU]
  Filled 2018-11-25: qty 5

## 2018-11-25 MED ORDER — ONDANSETRON HCL 8 MG PO TABS: 8.0000 mg | ORAL_TABLET | Freq: Two times a day (BID) | ORAL | 1 refills | Status: DC | PRN

## 2018-11-25 MED ORDER — SODIUM CHLORIDE 0.9 % IV SOLN
Freq: Once | INTRAVENOUS | Status: AC
  Administered 2018-11-25: 14:00:00 via INTRAVENOUS
  Filled 2018-11-25: qty 250

## 2018-11-25 MED ORDER — DOXORUBICIN HCL CHEMO IV INJECTION 2 MG/ML
60.0000 mg/m2 | Freq: Once | INTRAVENOUS | Status: AC
  Administered 2018-11-25: 156 mg via INTRAVENOUS
  Filled 2018-11-25: qty 78

## 2018-11-25 MED ORDER — DEXAMETHASONE 4 MG PO TABS: ORAL_TABLET | ORAL | 0 refills | Status: DC

## 2018-11-25 MED ORDER — PROCHLORPERAZINE MALEATE 10 MG PO TABS: 10.0000 mg | ORAL_TABLET | Freq: Four times a day (QID) | ORAL | 1 refills | Status: DC | PRN

## 2018-11-25 MED ORDER — PALONOSETRON HCL INJECTION 0.25 MG/5ML
0.2500 mg | Freq: Once | INTRAVENOUS | Status: AC
  Administered 2018-11-25: 0.25 mg via INTRAVENOUS

## 2018-11-25 MED ORDER — SODIUM CHLORIDE 0.9 % IV SOLN
Freq: Once | INTRAVENOUS | Status: AC
  Administered 2018-11-25: 15:00:00 via INTRAVENOUS
  Filled 2018-11-25: qty 5

## 2018-11-25 MED ORDER — SODIUM CHLORIDE 0.9 % IV SOLN
600.0000 mg/m2 | Freq: Once | INTRAVENOUS | Status: AC
  Administered 2018-11-25: 1560 mg via INTRAVENOUS
  Filled 2018-11-25: qty 78

## 2018-11-25 NOTE — Assessment & Plan Note (Addendum)
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  11/01/2018: MRI biopsy left breast IDC grade 1, ER PR positive HER-2 negative  Treatment plan: 1.Neoadjuvant chemotherapy with dose dense Adriamycin and Cytoxan every 2 weeks x4 followed by Taxol weekly x12 2.Followed by mastectomy 3.Followed by radiation 4.Followed by antiestrogen therapy. ----------------------------------------------------------------------------------------------------------------------------------------------------- Current treatment: Cycle 2 day 1 dose dense Adriamycin and Cytoxan Echocardiogram 11/05/2018: EF 55 to 60%  Chemo toxicities: 1. Strange feeling when receiving Doxorubicin 2. Nausea controlled with anti emetics  I spent a majority of the visit reviewing her labs in detail, her overall treatment plan, and discussing future treatments/times.    Return to clinic in 2 weeks for cycle 3

## 2018-11-25 NOTE — Progress Notes (Signed)
[facility] Follow up:    Tammy Beals, NP [place] [address]   DIAGNOSIS: Cancer Staging Malignant neoplasm of upper-outer quadrant of left breast in female, estrogen receptor positive ([place]) Staging form: Breast, AJCC 8th Edition - Clinical: Stage IIA (cT2, cN1(f), cM0, G1, ER+, PR+, HER2-) - Signed by Nicholas Lose, MD on 11/01/2018   SUMMARY OF ONCOLOGIC HISTORY:   Malignant neoplasm of upper-outer quadrant of left breast in female, estrogen receptor positive ([place])   10/22/2018 Initial Diagnosis    Screening detected left breast architectural distortion: 4.5 cm, UOQ, by ultrasound measured 5 cm at 1 o'clock position multiple enlarged lymph nodes, biopsy revealed grade 1 IDC with DCIS with lymphovascular invasion, lymph node biopsy positive, intramammary lymph node biopsy negative, ER 100%, PR 100%, Ki-67 20%, HER-2 1+ by IHC, T2 and 1 stage 2A    10/27/2018 Cancer Staging    Staging form: Breast, AJCC 8th Edition - Clinical: Stage IIA (cT2, cN1(f), cM0, G1, ER+, PR+, HER2-) - Signed by Nicholas Lose, MD on 11/01/2018    11/11/2018 -  Neo-Adjuvant Chemotherapy    Neoadjuvant chemotherapy with dose dense Adriamycin and Cytoxan every 2 weeks x4 followed by Taxol weekly x12     CURRENT THERAPY: cycle 2 Adriamycin/Cytoxan  INTERVAL HISTORY: [name] 58 y.o. female returns for evaluation prior to receiving her second cycle of neoadjuvant chemotherapy with Adriamycin and Cytoxan.  She is doing well today.  She was noted to have a low grade fever on her vitals.  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  She denies any other symptoms such as fatigue, myalgias, congestion, cough, etc.  She hasn't noted being feverish and doesn't feel poorly today either.     Patient Active Problem List   Diagnosis Date Noted  . Port-A-Cath in place 11/18/2018  . Malignant neoplasm of upper-outer quadrant of left breast in female, estrogen  receptor positive ([place]) 10/26/2018  . Multiple neurological symptoms 03/31/2017  . Pain in back 02/21/2014  . Female stress incontinence 02/21/2014  . Carbuncle 02/21/2014  . Symptomatic menopausal or female climacteric states 02/21/2014  . CONGESTIVE HEART FAILURE 03/22/2009  . CHEST PAIN-PRECORDIAL 03/22/2009  . HYPERTENSION 07/31/2008  . ABDOMINAL PAIN, UNSPECIFIED SITE 07/31/2008    has No Known Allergies.  MEDICAL HISTORY: Past Medical History:  Diagnosis Date  . Cancer [facility])    breast- left  . Complication of anesthesia   . Fatigue   . GERD (gastroesophageal reflux disease)    w nonobstructing esophageal stricture  . Headache   . Heat intolerance   . History of ETT 6/10   myoview EF 65%, normal wall motion, no ischemia or infarction, poor exercise capacity  . HTN (hypertension)   . Motion sickness   . Normal echocardiogram 6/10   EF 53-61% moderate diastolic dysfunction, mild LAE  . Numbness and tingling   . Obesity   . PONV (postoperative nausea and vomiting)   . S/P partial hysterectomy     SURGICAL HISTORY: Past Surgical History:  Procedure Laterality Date  . ABDOMINAL HYSTERECTOMY  2007   partial   . CARPAL TUNNEL RELEASE Bilateral   . FOOT SURGERY Bilateral   . hysterectomy (other)    . PORTACATH PLACEMENT N/A 11/10/2018   Procedure: INSERTION PORT-A-CATH WITH ULTRASOUND;  Surgeon: Erroll Luna, MD;  Location: [facility];  Service: General;  Laterality: N/A;  . TONSILLECTOMY  1968    SOCIAL HISTORY: Social History   Socioeconomic History  .  Marital status: Single    Spouse name: Not on file  . Number of children: 2  . Years of education: Not on file  . Highest education level: Not on file  Occupational History  . Occupation: Pharmacist, hospital  Social Needs  . Financial resource strain: Not on file  . Food insecurity:    Worry: Not on file    Inability: Not on file  . Transportation needs:    Medical: Not on file    Non-medical: Not on file  Tobacco  Use  . Smoking status: Never Smoker  . Smokeless tobacco: Never Used  Substance and Sexual Activity  . Alcohol use: No  . Drug use: No  . Sexual activity: Yes    Birth control/protection: Surgical  Lifestyle  . Physical activity:    Days per week: Not on file    Minutes per session: Not on file  . Stress: Not on file  Relationships  . Social connections:    Talks on phone: Not on file    Gets together: Not on file    Attends religious service: Not on file    Active member of club or organization: Not on file    Attends meetings of clubs or organizations: Not on file    Relationship status: Not on file  . Intimate partner violence:    Fear of current or ex partner: Not on file    Emotionally abused: Not on file    Physically abused: Not on file    Forced sexual activity: Not on file  Other Topics Concern  . 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 Heart attack Mother 69  . Heart disease Mother   . Heart attack Maternal Grandmother        age 8 or 109  . Heart disease Maternal Grandmother   . Neuropathy Neg Hx     Review of Systems  Constitutional: Negative for appetite change, chills, fatigue, fever and unexpected weight change.  HENT:   Negative for hearing loss, lump/mass, mouth sores and trouble swallowing.   Eyes: Negative for eye problems and icterus.  Respiratory: Negative for chest tightness, cough and shortness of breath.   Cardiovascular: Negative for chest pain, leg swelling and palpitations.  Gastrointestinal: Negative for abdominal distention, abdominal pain, constipation, diarrhea, nausea and vomiting.  Endocrine: Negative for hot flashes.  Musculoskeletal: Negative for arthralgias.  Skin: Negative for itching and rash.  Neurological: Negative for dizziness, extremity weakness,  headaches and numbness.  Hematological: Negative for adenopathy. Does not bruise/bleed easily.  Psychiatric/Behavioral: Negative for depression. The patient is not nervous/anxious.       PHYSICAL EXAMINATION  ECOG PERFORMANCE STATUS: 1 - Symptomatic but completely ambulatory  Vitals:   11/25/18 1258  BP: 119/83  Pulse: 87  Resp: 18  Temp: 99.4 F (37.4 C)  SpO2: 98%    Physical Exam Constitutional:      Appearance: Normal appearance.  HENT:     Head: Normocephalic and atraumatic.     Mouth/Throat:     Mouth: Mucous membranes are moist.     Pharynx: Oropharynx is clear. No oropharyngeal exudate or posterior oropharyngeal erythema.  Eyes:     General: No scleral icterus.    Pupils: Pupils are equal, round, and reactive to light.  Neck:     Musculoskeletal: Neck supple.  Cardiovascular:     Rate and Rhythm: Normal rate and regular rhythm.     Pulses: Normal pulses.     Heart sounds: Normal heart sounds.  Pulmonary:     Effort: Pulmonary effort is normal.     Breath sounds: Normal breath sounds.  Abdominal:     General: Abdomen is flat.     Palpations: Abdomen is soft.  Musculoskeletal:        General: No swelling.  Lymphadenopathy:     Cervical: No cervical adenopathy.  Skin:    General: Skin is warm and dry.     Capillary Refill: Capillary refill takes less than 2 seconds.     Findings: No rash.  Neurological:     General: No focal deficit present.     Mental Status: She is alert.  Psychiatric:        Mood and Affect: Mood normal.        Behavior: Behavior normal.     LABORATORY DATA:  CBC    Component Value Date/Time   WBC 5.4 11/25/2018 1224   WBC 9.9 07/06/2014 2145   WBC 6.2 02/21/2014 1340   RBC 3.83 (L) 11/25/2018 1224   HGB 10.5 (L) 11/25/2018 1224   HGB 12.6 11/12/2016 0830   HCT 33.2 (L) 11/25/2018 1224   HCT 40.5 11/12/2016 0830   PLT 144 (L) 11/25/2018 1224   PLT 199 11/12/2016 0830   MCV 86.7 11/25/2018 1224   MCV 85 11/12/2016  0830   MCV 84 07/06/2014 2145   MCH 27.4 11/25/2018 1224   MCHC 31.6 11/25/2018 1224   RDW 12.4 11/25/2018 1224   RDW 13.8 11/12/2016 0830   RDW 13.1 07/06/2014 2145   LYMPHSABS 1.2 11/25/2018 1224   MONOABS 0.8 11/25/2018 1224   EOSABS 0.0 11/25/2018 1224   BASOSABS 0.0 11/25/2018 1224    CMP     Component Value Date/Time   NA 142 11/25/2018 1224   NA 143 11/12/2016 0830   NA 136 07/06/2014 2145   K 3.6 11/25/2018 1224   K 3.3 (L) 07/06/2014 2145   CL 105 11/25/2018 1224   CL 103 07/06/2014 2145   CO2 29 11/25/2018 1224   CO2 25 07/06/2014 2145   GLUCOSE 98 11/25/2018 1224   GLUCOSE 138 (H) 07/06/2014 2145   BUN 13 11/25/2018 1224   BUN 12 11/12/2016 0830   BUN 12 07/06/2014 2145   CREATININE 0.89 11/25/2018 1224   CREATININE 0.97 07/06/2014 2145   CREATININE 0.84 02/21/2014 1340   CALCIUM 9.2 11/25/2018 1224   CALCIUM 8.6 07/06/2014 2145   PROT 6.8 11/25/2018 1224   PROT 7.2 11/12/2016 0830   PROT 7.6 07/06/2014 2145   ALBUMIN 3.4 (L) 11/25/2018 1224   ALBUMIN 3.6 07/06/2014 2145   AST 13 (L) 11/25/2018 1224   ALT 11 11/25/2018 1224   ALT 23 07/06/2014 2145   ALKPHOS 63 11/25/2018 1224   ALKPHOS 72 07/06/2014 2145   BILITOT 0.2 (L) 11/25/2018 1224   GFRNONAA >60 11/25/2018 1224   GFRNONAA >60 07/06/2014 2145   GFRAA >60 11/25/2018 1224   GFRAA >60 07/06/2014 2145        ASSESSMENT and PLAN:   Malignant neoplasm of upper-outer quadrant of left breast in female, estrogen receptor positive ([place]) 10/22/2018:Screening detected left breast architectural distortion: 4.5 cm, UOQ, by ultrasound measured 5 cm at 1 o'clock position multiple enlarged lymph nodes, biopsy revealed grade 1 IDC with DCIS with  lymphovascular invasion, lymph node biopsy positive, intramammary lymph node biopsy negative, ER 100%, PR 100%, Ki-67 20%, HER-2 1+ by IHC, T2 and 1 stage Ib  10/28/2018: Breast MRI: Non-mass enhancement with architectural distortion UOQ left breast 5 x 3 x 10 cm,  4 enlarged level 1 axillary lymph nodes.  11/01/2018: MRI biopsy left breast IDC grade 1, ER PR positive HER-2 negative  Treatment plan: 1.Neoadjuvant chemotherapy with dose dense Adriamycin and Cytoxan every 2 weeks x4 followed by Taxol weekly x12 2.Followed by mastectomy 3.Followed by radiation 4.Followed by antiestrogen therapy. ----------------------------------------------------------------------------------------------------------------------------------------------------- Current treatment: Cycle 2 day 1 dose dense Adriamycin and Cytoxan Echocardiogram 11/05/2018: EF 55 to 60%  Chemo toxicities: 1. Strange feeling when receiving Doxorubicin 2. Nausea controlled with anti emetics  I spent a majority of the visit reviewing her labs in detail, her overall treatment plan, and discussing future treatments/times.    Return to clinic in 2 weeks for cycle 3   All questions were answered. The patient knows to call the clinic with any problems, questions or concerns. We can certainly see the patient much sooner if necessary.  A total of (20) minutes of face-to-face time was spent with this patient with greater than 50% of that time in counseling and care-coordination.  This note was electronically signed. Scot Dock, NP 11/25/2018

## 2018-11-25 NOTE — Patient Instructions (Signed)
[facility] Discharge Instructions for Patients Receiving Chemotherapy  Today you received the following chemotherapy agents: Adriamycin, Cytoxan  To help prevent nausea and vomiting after your treatment, we encourage you to take your nausea medication as directed.   If you develop nausea and vomiting that is not controlled by your nausea medication, call the clinic.   BELOW ARE SYMPTOMS THAT SHOULD BE REPORTED IMMEDIATELY:  *FEVER GREATER THAN 100.5 F  *CHILLS WITH OR WITHOUT FEVER  NAUSEA AND VOMITING THAT IS NOT CONTROLLED WITH YOUR NAUSEA MEDICATION  *UNUSUAL SHORTNESS OF BREATH  *UNUSUAL BRUISING OR BLEEDING  TENDERNESS IN MOUTH AND THROAT WITH OR WITHOUT PRESENCE OF ULCERS  *URINARY PROBLEMS  *BOWEL PROBLEMS  UNUSUAL RASH Items with * indicate a potential emergency and should be followed up as soon as possible.  Feel free to call the clinic should you have any questions or concerns. The clinic phone number is [phone].  Please show the CHEMO ALERT CARD at check-in to the Emergency Department and triage nurse.   

## 2018-11-26 ENCOUNTER — Telehealth: Payer: Self-pay

## 2018-11-26 NOTE — Telephone Encounter (Signed)
Pt requesting a work excuse letter from 1/29-1/31/2020 from [place]. Called pt to let her know that she may stop in today and pick it up. Pt will be returning tomorrow, Saturday 11/27/2018 for injection appt. Told pt that will place in envelope and will have it sitting in the nurse station in infusion room when she comes on Saturday. Pt verbalized understanding and thankful for the call.

## 2018-11-27 ENCOUNTER — Inpatient Hospital Stay: Payer: BC Managed Care – PPO

## 2018-11-27 VITALS — BP 116/86 | HR 84 | Temp 97.9°F | Resp 18

## 2018-11-27 DIAGNOSIS — C50412 Malignant neoplasm of upper-outer quadrant of left female breast: Secondary | ICD-10-CM

## 2018-11-27 DIAGNOSIS — Z170 Estrogen receptor positive status [ER+]: Principal | ICD-10-CM

## 2018-11-27 DIAGNOSIS — Z5111 Encounter for antineoplastic chemotherapy: Secondary | ICD-10-CM | POA: Diagnosis not present

## 2018-11-27 MED ORDER — PEGFILGRASTIM-CBQV 6 MG/0.6ML ~~LOC~~ SOSY
PREFILLED_SYRINGE | SUBCUTANEOUS | Status: AC
  Filled 2018-11-27: qty 0.6

## 2018-11-27 MED ORDER — PEGFILGRASTIM-CBQV 6 MG/0.6ML ~~LOC~~ SOSY
6.0000 mg | PREFILLED_SYRINGE | Freq: Once | SUBCUTANEOUS | Status: AC
  Administered 2018-11-27: 6 mg via SUBCUTANEOUS

## 2018-12-08 NOTE — Progress Notes (Signed)
Patient Care Team: [name], NP as PCP - General Erroll Luna, MD as Consulting Physician (General Surgery) Nicholas Lose, MD as Consulting Physician (Hematology and Oncology) Kyung Rudd, MD as Consulting Physician (Radiation Oncology)  DIAGNOSIS:    ICD-10-CM   1. Malignant neoplasm of upper-outer quadrant of left breast in female, estrogen receptor positive ([place]) C50.412 DISCONTINUED: DOXOrubicin (ADRIAMYCIN) chemo injection 130 mg   Z17.0 DISCONTINUED: cyclophosphamide (CYTOXAN) 1,300 mg in sodium chloride 0.9 % 250 mL chemo infusion    SUMMARY OF ONCOLOGIC HISTORY:   Malignant neoplasm of upper-outer quadrant of left breast in female, estrogen receptor positive ([place])   10/22/2018 Initial Diagnosis    Screening detected left breast architectural distortion: 4.5 cm, UOQ, by ultrasound measured 5 cm at 1 o'clock position multiple enlarged lymph nodes, biopsy revealed grade 1 IDC with DCIS with lymphovascular invasion, lymph node biopsy positive, intramammary lymph node biopsy negative, ER 100%, PR 100%, Ki-67 20%, HER-2 1+ by IHC, T2 and 1 stage 2A    10/27/2018 Cancer Staging    Staging form: Breast, AJCC 8th Edition - Clinical: Stage IIA (cT2, cN1(f), cM0, G1, ER+, PR+, HER2-) - Signed by Nicholas Lose, MD on 11/01/2018    11/11/2018 -  Neo-Adjuvant Chemotherapy    Neoadjuvant chemotherapy with dose dense Adriamycin and Cytoxan every 2 weeks x4 followed by Taxol weekly x12     CHIEF COMPLIANT: Cycle 3 Adriamycin and Cytoxan  INTERVAL HISTORY: [name] is a 58 y.o. with above-mentioned history of left breast cancer who is currently on neoadjuvant chemotherapy with dose dense Adriamycin and Cytoxan and presents to the clinic today with her husband for cycle 3. Her last treatment was difficult and she was severely fatigued. She notes she could not drink anything cold or eat for the first week due to chest heaviness and a feeling of food and drink staying  in her chest. She denies any chest pain. She reports nausea and one episode of vomiting that resolved with Zofran and Compazine. She reports loss of taste and that bland foods, pickles, citrus, and fried fish are the only things that taste good. She lost all of her hair following cycle 2. She denies neuropathy but notes a cold muscle ache feeling in her legs. She has continued working throughout treatment. Her labs from today show: WBC 14.2, Hg 9.7, platelets 132, ANC   REVIEW OF SYSTEMS:   Constitutional: Denies fevers, chills or abnormal weight loss (+) fatigue (+) loss of appetite (+) loss of taste (+) hair loss Eyes: Denies blurriness of vision Ears, nose, mouth, throat, and face: Denies mucositis or sore throat Respiratory: Denies cough, dyspnea or wheezes Cardiovascular: Denies palpitation (+) chest discomfort Gastrointestinal: Denies heartburn or change in bowel habits (+) nausea (+) vomiting Skin: Denies abnormal skin rashes MSK: (+) cold muscle aches in legs Lymphatics: Denies new lymphadenopathy or easy bruising Neurological: Denies numbness, tingling or new weaknesses Behavioral/Psych: Mood is stable, no new changes  Extremities: No lower extremity edema Breast: denies any pain or lumps or nodules in either breasts All other systems were reviewed with the patient and are negative.  I have reviewed the past medical history, past surgical history, social history and family history with the patient and they are unchanged from previous note.  ALLERGIES:  has No Known Allergies.  MEDICATIONS:  Current Outpatient Medications  Medication Sig Dispense Refill  . carvedilol (COREG) 6.25 MG tablet TAKE 1 TABLET BY MOUTH TWICE DAILY (Patient taking differently: Take 6.25 mg by mouth  2 (two) times daily with a meal. ) 180 tablet 1  . dexamethasone (DECADRON) 4 MG tablet Take 1 tablet day after chemo and 1 tablet 2 days after chemo with food 8 tablet 0  . ibuprofen (ADVIL,MOTRIN) 600 MG  tablet Take 1 tablet (600 mg total) by mouth every 6 (six) hours as needed. (Patient taking differently: Take 600 mg by mouth every 6 (six) hours as needed for headache or moderate pain. ) 30 tablet 0  . ibuprofen (ADVIL,MOTRIN) 800 MG tablet Take 1 tablet (800 mg total) by mouth every 8 (eight) hours as needed. 30 tablet 0  . lidocaine-prilocaine (EMLA) cream Apply to affected area once 30 g 3  . lisinopril-hydrochlorothiazide (PRINZIDE,ZESTORETIC) 20-12.5 MG per tablet TAKE 2 TABLETS BY MOUTH DAILY (Patient taking differently: Take 1 tablet by mouth daily. ) 180 tablet 1  . ondansetron (ZOFRAN) 8 MG tablet Take 1 tablet (8 mg total) by mouth 2 (two) times daily as needed. Start on the third day after chemotherapy. 30 tablet 1  . oxyCODONE (OXY IR/ROXICODONE) 5 MG immediate release tablet Take 1 tablet (5 mg total) by mouth every 6 (six) hours as needed for severe pain. 12 tablet 0  . pantoprazole (PROTONIX) 40 MG tablet Take 1 tablet (40 mg total) by mouth daily. 30 tablet 0  . prochlorperazine (COMPAZINE) 10 MG tablet Take 1 tablet (10 mg total) by mouth every 6 (six) hours as needed (Nausea or vomiting). 30 tablet 1  . ranitidine (ZANTAC) 150 MG tablet Take 150 mg by mouth daily as needed for heartburn.     No current facility-administered medications for this visit.    Facility-Administered Medications Ordered in Other Visits  Medication Dose Route Frequency Provider Last Rate Last Dose  . cyclophosphamide (CYTOXAN) 1,300 mg in sodium chloride 0.9 % 250 mL chemo infusion  500 mg/m2 (Treatment Plan Recorded) Intravenous Once Nicholas Lose, MD      . DOXOrubicin (ADRIAMYCIN) chemo injection 130 mg  50 mg/m2 (Treatment Plan Recorded) Intravenous Once Nicholas Lose, MD      . fosaprepitant (EMEND) 150 mg, dexamethasone (DECADRON) 12 mg in sodium chloride 0.9 % 145 mL IVPB   Intravenous Once Nicholas Lose, MD 454 mL/hr at 12/09/18 1449    . gadopentetate dimeglumine (MAGNEVIST) injection 20 mL  20  mL Intravenous Once PRN Sarina Ill B, MD      . gadopentetate dimeglumine (MAGNEVIST) injection 20 mL  20 mL Intravenous Once PRN Sarina Ill B, MD      . heparin lock flush 100 unit/mL  500 Units Intracatheter Once PRN Nicholas Lose, MD      . sodium chloride flush (NS) 0.9 % injection 10 mL  10 mL Intracatheter PRN Nicholas Lose, MD        PHYSICAL EXAMINATION: ECOG PERFORMANCE STATUS: 1 - Symptomatic but completely ambulatory  Vitals:   12/09/18 1359  BP: 127/81  Pulse: (!) 104  Resp: 17  Temp: 98.7 F (37.1 C)  SpO2: 99%   Filed Weights   12/09/18 1359  Weight: 281 lb 14.4 oz (127.9 kg)    GENERAL: alert, no distress and comfortable SKIN: skin color, texture, turgor are normal, no rashes or significant lesions EYES: normal, Conjunctiva are pink and non-injected, sclera clear OROPHARYNX: no exudate, no erythema and lips, buccal mucosa, and tongue normal  NECK: supple, thyroid normal size, non-tender, without nodularity LYMPH: no palpable lymphadenopathy in the cervical, axillary or inguinal LUNGS: clear to auscultation and percussion with normal breathing effort HEART: regular  rate & rhythm and no murmurs and no lower extremity edema ABDOMEN: abdomen soft, non-tender and normal bowel sounds MUSCULOSKELETAL: no cyanosis of digits and no clubbing  NEURO: alert & oriented x 3 with fluent speech, no focal motor/sensory deficits EXTREMITIES: No lower extremity edema  LABORATORY DATA:  I have reviewed the data as listed CMP Latest Ref Rng & Units 12/09/2018 11/25/2018 11/18/2018  Glucose 70 - 99 mg/dL 106(H) 98 93  BUN 6 - 20 mg/dL _0 Creatinine 0.44 - 1.00 mg/dL 0.96 0.89 0.82  Sodium 135 - 145 mmol/L 143 142 139  Potassium 3.5 - 5.1 mmol/L 3.6 3.6 3.7  Chloride 98 - 111 mmol/L 107 105 103  CO2 22 - 32 mmol/L _1 Calcium 8.9 - 10.3 mg/dL 8.9 9.2 8.8(L)  Total Protein 6.5 - 8.1 g/dL 6.6 6.8 6.7  Total Bilirubin 0.3 - 1.2 mg/dL 0.3 0.2(L) 0.6  Alkaline  Phos 38 - 126 U/L 71 63 61  AST 15 - 41 U/L 13(L) 13(L) 13(L)  ALT 0 - 44 U/L _2 Lab Results  Component Value Date   WBC 14.2 (H) 12/09/2018   HGB 9.7 (L) 12/09/2018   HCT 29.7 (L) 12/09/2018   MCV 85.8 12/09/2018   PLT 132 (L) 12/09/2018   NEUTROABS 10.2 (H) 12/09/2018    ASSESSMENT & PLAN:  Malignant neoplasm of upper-outer quadrant of left breast in female, estrogen receptor positive ([place]) 10/22/2018:Screening detected left breast architectural distortion: 4.5 cm, UOQ, by ultrasound measured 5 cm at 1 o'clock position multiple enlarged lymph nodes, biopsy revealed grade 1 IDC with DCIS with lymphovascular invasion, lymph node biopsy positive, intramammary lymph node biopsy negative, ER 100%, PR 100%, Ki-67 20%, HER-2 1+ by IHC, T2 and 1 stage Ib  10/28/2018: Breast MRI: Non-mass enhancement with architectural distortion UOQ left breast 5 x 3 x 10 cm,4enlarged level 1 axillary lymph nodes.  11/01/2018: MRI biopsy left breast IDC grade 1, ER PR positive HER-2 negative  Treatment plan: 1.Neoadjuvant chemotherapy with dose dense Adriamycin and Cytoxan every 2 weeks x4 followed by Taxol weekly x12 2.Followed by mastectomy 3.Followed by radiation 4.Followed by antiestrogen therapy. ----------------------------------------------------------------------------------------------------------------------------------------------------- Current treatment: Cycle 3 dose dense Adriamycin and Cytoxan Echocardiogram 11/05/2018: EF 55 to 60%  Chemo toxicities: 1. Chest pressure 2.  Nausea and vomiting 3.  Severe fatigue 4.  Alopecia  We will reduce the dosage of cycle 3 of chemotherapy Patient has multiple questions regarding the treatment plan after completion of chemo.  Return to clinic in 2 weeks for cycle 4    No orders of the defined types were placed in this encounter.  The patient has a good understanding of the overall plan. she agrees with it. she will call  with any problems that may develop before the next visit here.  Nicholas Lose, MD 12/09/2018  Julious Oka Dorshimer am acting as scribe for Dr. Nicholas Lose.  I have reviewed the above documentation for accuracy and completeness, and I agree with the above.

## 2018-12-09 ENCOUNTER — Inpatient Hospital Stay: Payer: BC Managed Care – PPO

## 2018-12-09 ENCOUNTER — Inpatient Hospital Stay: Payer: BC Managed Care – PPO | Admitting: Hematology and Oncology

## 2018-12-09 DIAGNOSIS — Z5111 Encounter for antineoplastic chemotherapy: Secondary | ICD-10-CM | POA: Diagnosis not present

## 2018-12-09 DIAGNOSIS — C50412 Malignant neoplasm of upper-outer quadrant of left female breast: Secondary | ICD-10-CM

## 2018-12-09 DIAGNOSIS — Z170 Estrogen receptor positive status [ER+]: Secondary | ICD-10-CM | POA: Diagnosis not present

## 2018-12-09 DIAGNOSIS — C773 Secondary and unspecified malignant neoplasm of axilla and upper limb lymph nodes: Secondary | ICD-10-CM

## 2018-12-09 DIAGNOSIS — L658 Other specified nonscarring hair loss: Secondary | ICD-10-CM

## 2018-12-09 DIAGNOSIS — R439 Unspecified disturbances of smell and taste: Secondary | ICD-10-CM

## 2018-12-09 LAB — CMP (CANCER CENTER ONLY)
ALT: 12 U/L (ref 0–44)
AST: 13 U/L — AB (ref 15–41)
Albumin: 3.5 g/dL (ref 3.5–5.0)
Alkaline Phosphatase: 71 U/L (ref 38–126)
Anion gap: 10 (ref 5–15)
BILIRUBIN TOTAL: 0.3 mg/dL (ref 0.3–1.2)
BUN: 16 mg/dL (ref 6–20)
CO2: 26 mmol/L (ref 22–32)
Calcium: 8.9 mg/dL (ref 8.9–10.3)
Chloride: 107 mmol/L (ref 98–111)
Creatinine: 0.96 mg/dL (ref 0.44–1.00)
GFR, Est AFR Am: >60 mL/min (ref >60)
GFR, Estimated: >60 mL/min (ref >60)
Glucose, Bld: 106 mg/dL — ABNORMAL HIGH (ref 70–99)
POTASSIUM: 3.6 mmol/L (ref 3.5–5.1)
Sodium: 143 mmol/L (ref 135–145)
Total Protein: 6.6 g/dL (ref 6.5–8.1)

## 2018-12-09 LAB — CBC WITH DIFFERENTIAL (CANCER CENTER ONLY)
ABS IMMATURE GRANULOCYTES: 1.73 10*3/uL — AB (ref 0.00–0.07)
Basophils Absolute: 0.1 10*3/uL (ref 0.0–0.1)
Basophils Relative: 1 %
Eosinophils Absolute: 0 10*3/uL (ref 0.0–0.5)
Eosinophils Relative: 0 %
HCT: 29.7 % — ABNORMAL LOW (ref 36.0–46.0)
Hemoglobin: 9.7 g/dL — ABNORMAL LOW (ref 12.0–15.0)
Immature Granulocytes: 12 %
Lymphocytes Relative: 7 %
Lymphs Abs: 0.9 10*3/uL (ref 0.7–4.0)
MCH: 28 pg (ref 26.0–34.0)
MCHC: 32.7 g/dL (ref 30.0–36.0)
MCV: 85.8 fL (ref 80.0–100.0)
Monocytes Absolute: 1.2 10*3/uL — ABNORMAL HIGH (ref 0.1–1.0)
Monocytes Relative: 9 %
Neutro Abs: 10.2 10*3/uL — ABNORMAL HIGH (ref 1.7–7.7)
Neutrophils Relative %: 71 %
PLATELETS: 132 10*3/uL — AB (ref 150–400)
RBC: 3.46 MIL/uL — ABNORMAL LOW (ref 3.87–5.11)
RDW: 12.7 % (ref 11.5–15.5)
WBC Count: 14.2 10*3/uL — ABNORMAL HIGH (ref 4.0–10.5)
nRBC: 0.4 % — ABNORMAL HIGH (ref 0.0–0.2)

## 2018-12-09 MED ORDER — SODIUM CHLORIDE 0.9 % IV SOLN
Freq: Once | INTRAVENOUS | Status: AC
  Administered 2018-12-09: 15:00:00 via INTRAVENOUS
  Filled 2018-12-09: qty 5

## 2018-12-09 MED ORDER — SODIUM CHLORIDE 0.9 % IV SOLN
Freq: Once | INTRAVENOUS | Status: AC
  Administered 2018-12-09: 15:00:00 via INTRAVENOUS
  Filled 2018-12-09: qty 250

## 2018-12-09 MED ORDER — SODIUM CHLORIDE 0.9% FLUSH
10.0000 mL | INTRAVENOUS | Status: DC | PRN
  Administered 2018-12-09: 10 mL
  Filled 2018-12-09: qty 10

## 2018-12-09 MED ORDER — PANTOPRAZOLE SODIUM 40 MG PO TBEC: 40.0000 mg | DELAYED_RELEASE_TABLET | Freq: Every day | ORAL | 0 refills | Status: DC

## 2018-12-09 MED ORDER — PALONOSETRON HCL INJECTION 0.25 MG/5ML
0.2500 mg | Freq: Once | INTRAVENOUS | Status: AC
  Administered 2018-12-09: 0.25 mg via INTRAVENOUS

## 2018-12-09 MED ORDER — DOXORUBICIN HCL CHEMO IV INJECTION 2 MG/ML
50.0000 mg/m2 | Freq: Once | INTRAVENOUS | Status: AC
  Administered 2018-12-09: 130 mg via INTRAVENOUS
  Filled 2018-12-09: qty 65

## 2018-12-09 MED ORDER — SODIUM CHLORIDE 0.9 % IV SOLN
500.0000 mg/m2 | Freq: Once | INTRAVENOUS | Status: AC
  Administered 2018-12-09: 1300 mg via INTRAVENOUS
  Filled 2018-12-09: qty 65

## 2018-12-09 MED ORDER — HEPARIN SOD (PORK) LOCK FLUSH 100 UNIT/ML IV SOLN
500.0000 [IU] | Freq: Once | INTRAVENOUS | Status: AC | PRN
  Administered 2018-12-09: 500 [IU]
  Filled 2018-12-09: qty 5

## 2018-12-09 MED ORDER — PALONOSETRON HCL INJECTION 0.25 MG/5ML
INTRAVENOUS | Status: AC
  Filled 2018-12-09: qty 5

## 2018-12-09 NOTE — Assessment & Plan Note (Signed)
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  11/01/2018: MRI biopsy left breast IDC grade 1, ER PR positive HER-2 negative  Treatment plan: 1.Neoadjuvant chemotherapy with dose dense Adriamycin and Cytoxan every 2 weeks x4 followed by Taxol weekly x12 2.Followed by mastectomy 3.Followed by radiation 4.Followed by antiestrogen therapy. ----------------------------------------------------------------------------------------------------------------------------------------------------- Current treatment: Cycle 3 dose dense Adriamycin and Cytoxan Echocardiogram 11/05/2018: EF 55 to 60%  Chemo toxicities: 1.  Extremely mild nausea Patient missed her Udenyca injection. We will set her up for Granix 480 mcg daily for the next 3 days.  Return to clinic in 2 weeks for cycle 4

## 2018-12-09 NOTE — Patient Instructions (Signed)
[facility] Discharge Instructions for Patients Receiving Chemotherapy  Today you received the following chemotherapy agents: Adriamycin, Cytoxan  To help prevent nausea and vomiting after your treatment, we encourage you to take your nausea medication as directed.   If you develop nausea and vomiting that is not controlled by your nausea medication, call the clinic.   BELOW ARE SYMPTOMS THAT SHOULD BE REPORTED IMMEDIATELY:  *FEVER GREATER THAN 100.5 F  *CHILLS WITH OR WITHOUT FEVER  NAUSEA AND VOMITING THAT IS NOT CONTROLLED WITH YOUR NAUSEA MEDICATION  *UNUSUAL SHORTNESS OF BREATH  *UNUSUAL BRUISING OR BLEEDING  TENDERNESS IN MOUTH AND THROAT WITH OR WITHOUT PRESENCE OF ULCERS  *URINARY PROBLEMS  *BOWEL PROBLEMS  UNUSUAL RASH Items with * indicate a potential emergency and should be followed up as soon as possible.  Feel free to call the clinic should you have any questions or concerns. The clinic phone number is [phone].  Please show the CHEMO ALERT CARD at check-in to the Emergency Department and triage nurse.   

## 2018-12-11 ENCOUNTER — Inpatient Hospital Stay: Payer: BC Managed Care – PPO

## 2018-12-11 VITALS — BP 109/77 | HR 91 | Temp 97.5°F | Resp 16

## 2018-12-11 DIAGNOSIS — Z170 Estrogen receptor positive status [ER+]: Principal | ICD-10-CM

## 2018-12-11 DIAGNOSIS — C50412 Malignant neoplasm of upper-outer quadrant of left female breast: Secondary | ICD-10-CM

## 2018-12-11 DIAGNOSIS — Z5111 Encounter for antineoplastic chemotherapy: Secondary | ICD-10-CM | POA: Diagnosis not present

## 2018-12-11 MED ORDER — PEGFILGRASTIM-CBQV 6 MG/0.6ML ~~LOC~~ SOSY
6.0000 mg | PREFILLED_SYRINGE | Freq: Once | SUBCUTANEOUS | Status: AC
  Administered 2018-12-11: 6 mg via SUBCUTANEOUS

## 2018-12-11 MED ORDER — PEGFILGRASTIM-CBQV 6 MG/0.6ML ~~LOC~~ SOSY
PREFILLED_SYRINGE | SUBCUTANEOUS | Status: AC
  Filled 2018-12-11: qty 0.6

## 2018-12-11 NOTE — Patient Instructions (Signed)
Pegfilgrastim injection  What is this medicine?  PEGFILGRASTIM (PEG fil gra stim) is a long-acting granulocyte colony-stimulating factor that stimulates the growth of neutrophils, a type of white blood cell important in the body's fight against infection. It is used to reduce the incidence of fever and infection in patients with certain types of cancer who are receiving chemotherapy that affects the bone marrow, and to increase survival after being exposed to high doses of radiation.  This medicine may be used for other purposes; ask your health care provider or pharmacist if you have questions.  COMMON BRAND NAME(S): Fulphila, Neulasta, UDENYCA  What should I tell my health care provider before I take this medicine?  They need to know if you have any of these conditions:  -kidney disease  -latex allergy  -ongoing radiation therapy  -sickle cell disease  -skin reactions to acrylic adhesives (On-Body Injector only)  -an unusual or allergic reaction to pegfilgrastim, filgrastim, other medicines, foods, dyes, or preservatives  -pregnant or trying to get pregnant  -breast-feeding  How should I use this medicine?  This medicine is for injection under the skin. If you get this medicine at home, you will be taught how to prepare and give the pre-filled syringe or how to use the On-body Injector. Refer to the patient Instructions for Use for detailed instructions. Use exactly as directed. Tell your healthcare provider immediately if you suspect that the On-body Injector may not have performed as intended or if you suspect the use of the On-body Injector resulted in a missed or partial dose.  It is important that you put your used needles and syringes in a special sharps container. Do not put them in a trash can. If you do not have a sharps container, call your pharmacist or healthcare provider to get one.  Talk to your pediatrician regarding the use of this medicine in children. While this drug may be prescribed for  selected conditions, precautions do apply.  Overdosage: If you think you have taken too much of this medicine contact a poison control center or emergency room at once.  NOTE: This medicine is only for you. Do not share this medicine with others.  What if I miss a dose?  It is important not to miss your dose. Call your doctor or health care professional if you miss your dose. If you miss a dose due to an On-body Injector failure or leakage, a new dose should be administered as soon as possible using a single prefilled syringe for manual use.  What may interact with this medicine?  Interactions have not been studied.  Give your health care provider a list of all the medicines, herbs, non-prescription drugs, or dietary supplements you use. Also tell them if you smoke, drink alcohol, or use illegal drugs. Some items may interact with your medicine.  This list may not describe all possible interactions. Give your health care provider a list of all the medicines, herbs, non-prescription drugs, or dietary supplements you use. Also tell them if you smoke, drink alcohol, or use illegal drugs. Some items may interact with your medicine.  What should I watch for while using this medicine?  You may need blood work done while you are taking this medicine.  If you are going to need a MRI, CT scan, or other procedure, tell your doctor that you are using this medicine (On-Body Injector only).  What side effects may I notice from receiving this medicine?  Side effects that you should report to   your doctor or health care professional as soon as possible:  -allergic reactions like skin rash, itching or hives, swelling of the face, lips, or tongue  -back pain  -dizziness  -fever  -pain, redness, or irritation at site where injected  -pinpoint red spots on the skin  -red or dark-brown urine  -shortness of breath or breathing problems  -stomach or side pain, or pain at the shoulder  -swelling  -tiredness  -trouble passing urine or  change in the amount of urine  Side effects that usually do not require medical attention (report to your doctor or health care professional if they continue or are bothersome):  -bone pain  -muscle pain  This list may not describe all possible side effects. Call your doctor for medical advice about side effects. You may report side effects to FDA at 1-800-FDA-1088.  Where should I keep my medicine?  Keep out of the reach of children.  If you are using this medicine at home, you will be instructed on how to store it. Throw away any unused medicine after the expiration date on the label.  NOTE: This sheet is a summary. It may not cover all possible information. If you have questions about this medicine, talk to your doctor, pharmacist, or health care provider.   2019 Elsevier/Gold Standard (2018-01-04 16:57:08)

## 2018-12-22 NOTE — Progress Notes (Signed)
Patient Care Team: [name], NP as PCP - General Erroll Luna, MD as Consulting Physician (General Surgery) Nicholas Lose, MD as Consulting Physician (Hematology and Oncology) Kyung Rudd, MD as Consulting Physician (Radiation Oncology)  DIAGNOSIS:    ICD-10-CM   1. Malignant neoplasm of upper-outer quadrant of left breast in female, estrogen receptor positive ([name]) C50.412    Z17.0     SUMMARY OF ONCOLOGIC HISTORY:   Malignant neoplasm of upper-outer quadrant of left breast in female, estrogen receptor positive ([name])   10/22/2018 Initial Diagnosis    Screening detected left breast architectural distortion: 4.5 cm, UOQ, by ultrasound measured 5 cm at 1 o'clock position multiple enlarged lymph nodes, biopsy revealed grade 1 IDC with DCIS with lymphovascular invasion, lymph node biopsy positive, intramammary lymph node biopsy negative, ER 100%, PR 100%, Ki-67 20%, HER-2 1+ by IHC, T2 and 1 stage 2A    10/27/2018 Cancer Staging    Staging form: Breast, AJCC 8th Edition - Clinical: Stage IIA (cT2, cN1(f), cM0, G1, ER+, PR+, HER2-) - Signed by Nicholas Lose, MD on 11/01/2018    11/11/2018 -  Neo-Adjuvant Chemotherapy    Neoadjuvant chemotherapy with dose dense Adriamycin and Cytoxan every 2 weeks x4 followed by Taxol weekly x12     CHIEF COMPLIANT: Cycle 4 Adriamycin and Cytoxan  INTERVAL HISTORY: [name] is a 58 y.o. with above-mentioned history of left breast cancer who is currently on neoadjuvant chemotherapy with dose dense Adriamycin and Cytoxan and presents to the clinic today with her husband for cycle 4. She tolerated her last treatment better and was able to work, but reports constant fatigue and mild nausea. She denies SOB, but occasionally feels lightheaded and dizzy upon standing. She has been eating a lot of protein. Today's labs show: WBC 11.6, Hg 8.9, platelets 139.  REVIEW OF SYSTEMS:   Constitutional: Denies fevers, chills or abnormal weight  loss (+) fatigue (+) lightheadedness (+) dizziness Eyes: Denies blurriness of vision Ears, nose, mouth, throat, and face: Denies mucositis or sore throat Respiratory: Denies cough, dyspnea or wheezes Cardiovascular: Denies palpitation, chest discomfort Gastrointestinal: Denies heartburn or change in bowel habits (+) mild nausea Skin: Denies abnormal skin rashes Lymphatics: Denies new lymphadenopathy or easy bruising Neurological: Denies numbness, tingling or new weaknesses Behavioral/Psych: Mood is stable, no new changes  Extremities: No lower extremity edema Breast: denies any pain or lumps or nodules in either breasts All other systems were reviewed with the patient and are negative.  I have reviewed the past medical history, past surgical history, social history and family history with the patient and they are unchanged from previous note.  ALLERGIES:  has No Known Allergies.  MEDICATIONS:  Current Outpatient Medications  Medication Sig Dispense Refill  . carvedilol (COREG) 6.25 MG tablet TAKE 1 TABLET BY MOUTH TWICE DAILY (Patient taking differently: Take 6.25 mg by mouth 2 (two) times daily with a meal. ) 180 tablet 1  . dexamethasone (DECADRON) 4 MG tablet Take 1 tablet day after chemo and 1 tablet 2 days after chemo with food 8 tablet 0  . ibuprofen (ADVIL,MOTRIN) 600 MG tablet Take 1 tablet (600 mg total) by mouth every 6 (six) hours as needed. (Patient taking differently: Take 600 mg by mouth every 6 (six) hours as needed for headache or moderate pain. ) 30 tablet 0  . ibuprofen (ADVIL,MOTRIN) 800 MG tablet Take 1 tablet (800 mg total) by mouth every 8 (eight) hours as needed. 30 tablet 0  . lidocaine-prilocaine (EMLA) cream  Apply to affected area once 30 g 3  . lisinopril-hydrochlorothiazide (PRINZIDE,ZESTORETIC) 20-12.5 MG per tablet TAKE 2 TABLETS BY MOUTH DAILY (Patient taking differently: Take 1 tablet by mouth daily. ) 180 tablet 1  . ondansetron (ZOFRAN) 8 MG tablet Take 1  tablet (8 mg total) by mouth 2 (two) times daily as needed. Start on the third day after chemotherapy. 30 tablet 1  . oxyCODONE (OXY IR/ROXICODONE) 5 MG immediate release tablet Take 1 tablet (5 mg total) by mouth every 6 (six) hours as needed for severe pain. 12 tablet 0  . pantoprazole (PROTONIX) 40 MG tablet Take 1 tablet (40 mg total) by mouth daily. 30 tablet 0  . prochlorperazine (COMPAZINE) 10 MG tablet Take 1 tablet (10 mg total) by mouth every 6 (six) hours as needed (Nausea or vomiting). 30 tablet 1  . ranitidine (ZANTAC) 150 MG tablet Take 150 mg by mouth daily as needed for heartburn.     No current facility-administered medications for this visit.    Facility-Administered Medications Ordered in Other Visits  Medication Dose Route Frequency Provider Last Rate Last Dose  . gadopentetate dimeglumine (MAGNEVIST) injection 20 mL  20 mL Intravenous Once PRN Melvenia Beam, MD      . gadopentetate dimeglumine (MAGNEVIST) injection 20 mL  20 mL Intravenous Once PRN Melvenia Beam, MD        PHYSICAL EXAMINATION: ECOG PERFORMANCE STATUS: 1 - Symptomatic but completely ambulatory  Vitals:   12/23/18 1426  BP: 94/71  Pulse: (!) 110  Resp: 17  Temp: 98.7 F (37.1 C)  SpO2: 99%   Filed Weights   12/23/18 1426  Weight: 279 lb 8 oz (126.8 kg)    GENERAL: alert, no distress and comfortable SKIN: skin color, texture, turgor are normal, no rashes or significant lesions EYES: normal, Conjunctiva are pink and non-injected, sclera clear OROPHARYNX: no exudate, no erythema and lips, buccal mucosa, and tongue normal  NECK: supple, thyroid normal size, non-tender, without nodularity LYMPH: no palpable lymphadenopathy in the cervical, axillary or inguinal LUNGS: clear to auscultation and percussion with normal breathing effort HEART: regular rate & rhythm and no murmurs and no lower extremity edema ABDOMEN: abdomen soft, non-tender and normal bowel sounds MUSCULOSKELETAL: no cyanosis  of digits and no clubbing  NEURO: alert & oriented x 3 with fluent speech, no focal motor/sensory deficits EXTREMITIES: No lower extremity edema  LABORATORY DATA:  I have reviewed the data as listed CMP Latest Ref Rng & Units 12/23/2018 12/09/2018 11/25/2018  Glucose 70 - 99 mg/dL 138(H) 106(H) 98  BUN 6 - 20 mg/dL _0 Creatinine 0.44 - 1.00 mg/dL 0.91 0.96 0.89  Sodium 135 - 145 mmol/L 141 143 142  Potassium 3.5 - 5.1 mmol/L 3.3(L) 3.6 3.6  Chloride 98 - 111 mmol/L 105 107 105  CO2 22 - 32 mmol/L _1 Calcium 8.9 - 10.3 mg/dL 9.1 8.9 9.2  Total Protein 6.5 - 8.1 g/dL 6.6 6.6 6.8  Total Bilirubin 0.3 - 1.2 mg/dL 0.4 0.3 0.2(L)  Alkaline Phos 38 - 126 U/L 68 71 63  AST 15 - 41 U/L 11(L) 13(L) 13(L)  ALT 0 - 44 U/L _2 Lab Results  Component Value Date   WBC 11.6 (H) 12/23/2018   HGB 8.9 (L) 12/23/2018   HCT 27.9 (L) 12/23/2018   MCV 88.0 12/23/2018   PLT 139 (L) 12/23/2018   NEUTROABS 8.7 (H) 12/23/2018    ASSESSMENT & PLAN:  Malignant neoplasm of upper-outer quadrant of left breast in female, estrogen receptor positive ([place]) 10/22/2018:Screening detected left breast architectural distortion: 4.5 cm, UOQ, by ultrasound measured 5 cm at 1 o'clock position multiple enlarged lymph nodes, biopsy revealed grade 1 IDC with DCIS with lymphovascular invasion, lymph node biopsy positive, intramammary lymph node biopsy negative, ER 100%, PR 100%, Ki-67 20%, HER-2 1+ by IHC, T2 and 1 stage Ib  10/28/2018: Breast MRI: Non-mass enhancement with architectural distortion UOQ left breast 5 x 3 x 10 cm,4enlarged level 1 axillary lymph nodes.  11/01/2018: MRI biopsy left breast IDC grade 1, ER PR positive HER-2 negative  Treatment plan: 1.Neoadjuvant chemotherapy with dose dense Adriamycin and Cytoxan every 2 weeks x4 followed by Taxol weekly x12 2.Followed by mastectomy 3.Followed by radiation 4.Followed by antiestrogen therapy.  ----------------------------------------------------------------------------------------------------------------------------------------------------- Current treatment: Cycle 4dose dense Adriamycin and Cytoxan Echocardiogram 11/05/2018: EF 55 to 60%  Chemo toxicities: 1.Chest pressure 2.  Nausea and vomiting: Much improved 3.  Severe fatigue 4.  Alopecia 5.  Chemotherapy-induced anemia: Hemoglobin 8.9.  They are watching it very closely.  If it drops below 8 she will need blood transfusion.  Return to clinic in 2 weeks for cycle 1 Taxol  No orders of the defined types were placed in this encounter.  The patient has a good understanding of the overall plan. she agrees with it. she will call with any problems that may develop before the next visit here.  Nicholas Lose, MD 12/23/2018  Julious Oka Dorshimer am acting as scribe for Dr. Nicholas Lose.  I have reviewed the above documentation for accuracy and completeness, and I agree with the above.

## 2018-12-23 ENCOUNTER — Ambulatory Visit: Payer: BC Managed Care – PPO

## 2018-12-23 ENCOUNTER — Other Ambulatory Visit: Payer: Self-pay

## 2018-12-23 ENCOUNTER — Inpatient Hospital Stay: Payer: BC Managed Care – PPO | Attending: Hematology and Oncology

## 2018-12-23 ENCOUNTER — Other Ambulatory Visit: Payer: BC Managed Care – PPO

## 2018-12-23 ENCOUNTER — Inpatient Hospital Stay: Payer: BC Managed Care – PPO

## 2018-12-23 ENCOUNTER — Ambulatory Visit: Payer: BC Managed Care – PPO | Admitting: Hematology and Oncology

## 2018-12-23 ENCOUNTER — Inpatient Hospital Stay (HOSPITAL_BASED_OUTPATIENT_CLINIC_OR_DEPARTMENT_OTHER): Payer: BC Managed Care – PPO | Admitting: Hematology and Oncology

## 2018-12-23 VITALS — HR 99

## 2018-12-23 DIAGNOSIS — D6481 Anemia due to antineoplastic chemotherapy: Secondary | ICD-10-CM | POA: Insufficient documentation

## 2018-12-23 DIAGNOSIS — C773 Secondary and unspecified malignant neoplasm of axilla and upper limb lymph nodes: Secondary | ICD-10-CM | POA: Insufficient documentation

## 2018-12-23 DIAGNOSIS — Z5111 Encounter for antineoplastic chemotherapy: Secondary | ICD-10-CM | POA: Insufficient documentation

## 2018-12-23 DIAGNOSIS — C50412 Malignant neoplasm of upper-outer quadrant of left female breast: Secondary | ICD-10-CM

## 2018-12-23 DIAGNOSIS — R5383 Other fatigue: Secondary | ICD-10-CM

## 2018-12-23 DIAGNOSIS — Z95828 Presence of other vascular implants and grafts: Secondary | ICD-10-CM

## 2018-12-23 DIAGNOSIS — Z170 Estrogen receptor positive status [ER+]: Secondary | ICD-10-CM | POA: Insufficient documentation

## 2018-12-23 DIAGNOSIS — R42 Dizziness and giddiness: Secondary | ICD-10-CM

## 2018-12-23 DIAGNOSIS — R110 Nausea: Secondary | ICD-10-CM

## 2018-12-23 DIAGNOSIS — Z5189 Encounter for other specified aftercare: Secondary | ICD-10-CM | POA: Insufficient documentation

## 2018-12-23 LAB — CBC WITH DIFFERENTIAL (CANCER CENTER ONLY)
Abs Immature Granulocytes: 1.27 10*3/uL — ABNORMAL HIGH (ref 0.00–0.07)
Basophils Absolute: 0 10*3/uL (ref 0.0–0.1)
Basophils Relative: 0 %
EOS PCT: 0 %
Eosinophils Absolute: 0 10*3/uL (ref 0.0–0.5)
HCT: 27.9 % — ABNORMAL LOW (ref 36.0–46.0)
Hemoglobin: 8.9 g/dL — ABNORMAL LOW (ref 12.0–15.0)
Immature Granulocytes: 11 %
LYMPHS PCT: 5 %
Lymphs Abs: 0.6 10*3/uL — ABNORMAL LOW (ref 0.7–4.0)
MCH: 28.1 pg (ref 26.0–34.0)
MCHC: 31.9 g/dL (ref 30.0–36.0)
MCV: 88 fL (ref 80.0–100.0)
Monocytes Absolute: 1 10*3/uL (ref 0.1–1.0)
Monocytes Relative: 9 %
Neutro Abs: 8.7 10*3/uL — ABNORMAL HIGH (ref 1.7–7.7)
Neutrophils Relative %: 75 %
Platelet Count: 139 10*3/uL — ABNORMAL LOW (ref 150–400)
RBC: 3.17 MIL/uL — ABNORMAL LOW (ref 3.87–5.11)
RDW: 14.5 % (ref 11.5–15.5)
WBC Count: 11.6 10*3/uL — ABNORMAL HIGH (ref 4.0–10.5)
nRBC: 0.6 % — ABNORMAL HIGH (ref 0.0–0.2)

## 2018-12-23 LAB — CMP (CANCER CENTER ONLY)
ALT: 9 U/L (ref 0–44)
AST: 11 U/L — ABNORMAL LOW (ref 15–41)
Albumin: 3.4 g/dL — ABNORMAL LOW (ref 3.5–5.0)
Alkaline Phosphatase: 68 U/L (ref 38–126)
Anion gap: 12 (ref 5–15)
BUN: 13 mg/dL (ref 6–20)
CHLORIDE: 105 mmol/L (ref 98–111)
CO2: 24 mmol/L (ref 22–32)
Calcium: 9.1 mg/dL (ref 8.9–10.3)
Creatinine: 0.91 mg/dL (ref 0.44–1.00)
GFR, Est AFR Am: >60 mL/min (ref >60)
GFR, Estimated: >60 mL/min (ref >60)
Glucose, Bld: 138 mg/dL — ABNORMAL HIGH (ref 70–99)
Potassium: 3.3 mmol/L — ABNORMAL LOW (ref 3.5–5.1)
Sodium: 141 mmol/L (ref 135–145)
Total Bilirubin: 0.4 mg/dL (ref 0.3–1.2)
Total Protein: 6.6 g/dL (ref 6.5–8.1)

## 2018-12-23 MED ORDER — HEPARIN SOD (PORK) LOCK FLUSH 100 UNIT/ML IV SOLN
500.0000 [IU] | Freq: Once | INTRAVENOUS | Status: DC | PRN
  Filled 2018-12-23: qty 5

## 2018-12-23 MED ORDER — SODIUM CHLORIDE 0.9 % IV SOLN
500.0000 mg/m2 | Freq: Once | INTRAVENOUS | Status: AC
  Administered 2018-12-23: 1300 mg via INTRAVENOUS
  Filled 2018-12-23: qty 65

## 2018-12-23 MED ORDER — SODIUM CHLORIDE 0.9 % IV SOLN
Freq: Once | INTRAVENOUS | Status: AC
  Administered 2018-12-23: 16:00:00 via INTRAVENOUS
  Filled 2018-12-23: qty 5

## 2018-12-23 MED ORDER — SODIUM CHLORIDE 0.9 % IV SOLN
Freq: Once | INTRAVENOUS | Status: AC
  Administered 2018-12-23: 15:00:00 via INTRAVENOUS
  Filled 2018-12-23: qty 250

## 2018-12-23 MED ORDER — SODIUM CHLORIDE 0.9% FLUSH
10.0000 mL | INTRAVENOUS | Status: DC | PRN
  Administered 2018-12-23: 10 mL
  Filled 2018-12-23: qty 10

## 2018-12-23 MED ORDER — DOXORUBICIN HCL CHEMO IV INJECTION 2 MG/ML
50.0000 mg/m2 | Freq: Once | INTRAVENOUS | Status: AC
  Administered 2018-12-23: 130 mg via INTRAVENOUS
  Filled 2018-12-23: qty 65

## 2018-12-23 MED ORDER — PALONOSETRON HCL INJECTION 0.25 MG/5ML
0.2500 mg | Freq: Once | INTRAVENOUS | Status: AC
  Administered 2018-12-23: 0.25 mg via INTRAVENOUS

## 2018-12-23 MED ORDER — PALONOSETRON HCL INJECTION 0.25 MG/5ML
INTRAVENOUS | Status: AC
  Filled 2018-12-23: qty 5

## 2018-12-23 NOTE — Patient Instructions (Addendum)
[facility] Discharge Instructions for Patients Receiving Chemotherapy  Today you received the following chemotherapy agents: Adriamycin, Cytoxan  To help prevent nausea and vomiting after your treatment, we encourage you to take your nausea medication as directed.   If you develop nausea and vomiting that is not controlled by your nausea medication, call the clinic.   BELOW ARE SYMPTOMS THAT SHOULD BE REPORTED IMMEDIATELY:  *FEVER GREATER THAN 100.5 F  *CHILLS WITH OR WITHOUT FEVER  NAUSEA AND VOMITING THAT IS NOT CONTROLLED WITH YOUR NAUSEA MEDICATION  *UNUSUAL SHORTNESS OF BREATH  *UNUSUAL BRUISING OR BLEEDING  TENDERNESS IN MOUTH AND THROAT WITH OR WITHOUT PRESENCE OF ULCERS  *URINARY PROBLEMS  *BOWEL PROBLEMS  UNUSUAL RASH Items with * indicate a potential emergency and should be followed up as soon as possible.  Feel free to call the clinic should you have any questions or concerns. The clinic phone number is [phone].  Please show the [place] at check-in to the Emergency Department and triage nurse.   Hypokalemia Hypokalemia means that the amount of potassium in the blood is lower than normal.Potassium is a chemical that helps regulate the amount of fluid in the body (electrolyte). It also stimulates muscle tightening (contraction) and helps nerves work properly.Normally, most of the body's potassium is inside of cells, and only a very small amount is in the blood. Because the amount in the blood is so small, minor changes to potassium levels in the blood can be life-threatening. What are the causes? This condition may be caused by:  Antibiotic medicine.  Diarrhea or vomiting. Taking too much of a medicine that helps you have a bowel movement (laxative) can cause diarrhea and lead to hypokalemia.  Chronic kidney disease (CKD).  Medicines that help the body get rid of excess fluid (diuretics).  Eating disorders, such as  bulimia.  Low magnesium levels in the body.  Sweating a lot. What are the signs or symptoms? Symptoms of this condition include:  Weakness.  Constipation.  Fatigue.  Muscle cramps.  Mental confusion.  Skipped heartbeats or irregular heartbeat (palpitations).  Tingling or numbness. How is this diagnosed? This condition is diagnosed with a blood test. How is this treated? Hypokalemia can be treated by taking potassium supplements by mouth or adjusting the medicines that you take. Treatment may also include eating more foods that contain a lot of potassium. If your potassium level is very low, you may need to get potassium through an IV tube in one of your veins and be monitored in the hospital. Follow these instructions at home:   Take over-the-counter and prescription medicines only as told by your health care provider. This includes vitamins and supplements.  Eat a healthy diet. A healthy diet includes fresh fruits and vegetables, whole grains, healthy fats, and lean proteins.  If instructed, eat more foods that contain a lot of potassium, such as: ? Nuts, such as peanuts and pistachios. ? Seeds, such as sunflower seeds and pumpkin seeds. ? Peas, lentils, and lima beans. ? Whole grain and bran cereals and breads. ? Fresh fruits and vegetables, such as apricots, avocado, bananas, cantaloupe, kiwi, oranges, tomatoes, asparagus, and potatoes. ? Orange juice. ? Tomato juice. ? Red meats. ? Yogurt.  Keep all follow-up visits as told by your health care provider. This is important. Contact a health care provider if:  You have weakness that gets worse.  You feel your heart pounding or racing.  You vomit.  You have diarrhea.  You have diabetes (diabetes mellitus) and you have trouble keeping your blood sugar (glucose) in your target range. Get help right away if:  You have chest pain.  You have shortness of breath.  You have vomiting or diarrhea that lasts for  more than 2 days.  You faint. This information is not intended to replace advice given to you by your health care provider. Make sure you discuss any questions you have with your health care provider. Document Released: 09/29/2005 Document Revised: 05/17/2016 Document Reviewed: 05/17/2016 Elsevier Interactive Patient Education  2019 [employer].

## 2018-12-23 NOTE — Assessment & Plan Note (Signed)
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  11/01/2018: MRI biopsy left breast IDC grade 1, ER PR positive HER-2 negative  Treatment plan: 1.Neoadjuvant chemotherapy with dose dense Adriamycin and Cytoxan every 2 weeks x4 followed by Taxol weekly x12 2.Followed by mastectomy 3.Followed by radiation 4.Followed by antiestrogen therapy. ----------------------------------------------------------------------------------------------------------------------------------------------------- Current treatment: Cycle 4dose dense Adriamycin and Cytoxan Echocardiogram 11/05/2018: EF 55 to 60%  Chemo toxicities: 1.Chest pressure 2.  Nausea and vomiting: Much improved 3.  Severe fatigue 4.  Alopecia 5.  Chemotherapy-induced anemia: Hemoglobin 8.9.  They are watching it very closely.  If it drops below 8 she will need blood transfusion.  Return to clinic in 2 weeks for cycle 1 Taxol

## 2018-12-25 ENCOUNTER — Inpatient Hospital Stay: Payer: BC Managed Care – PPO

## 2018-12-25 ENCOUNTER — Other Ambulatory Visit: Payer: Self-pay

## 2018-12-25 VITALS — BP 96/70 | HR 91 | Temp 98.0°F | Resp 18

## 2018-12-25 DIAGNOSIS — C50412 Malignant neoplasm of upper-outer quadrant of left female breast: Secondary | ICD-10-CM

## 2018-12-25 DIAGNOSIS — Z5111 Encounter for antineoplastic chemotherapy: Secondary | ICD-10-CM | POA: Diagnosis not present

## 2018-12-25 DIAGNOSIS — Z170 Estrogen receptor positive status [ER+]: Principal | ICD-10-CM

## 2018-12-25 MED ORDER — PEGFILGRASTIM-CBQV 6 MG/0.6ML ~~LOC~~ SOSY
PREFILLED_SYRINGE | SUBCUTANEOUS | Status: AC
  Filled 2018-12-25: qty 0.6

## 2018-12-25 MED ORDER — PEGFILGRASTIM-CBQV 6 MG/0.6ML ~~LOC~~ SOSY
6.0000 mg | PREFILLED_SYRINGE | Freq: Once | SUBCUTANEOUS | Status: AC
  Administered 2018-12-25: 6 mg via SUBCUTANEOUS

## 2018-12-25 NOTE — Patient Instructions (Signed)
Pegfilgrastim injection  What is this medicine?  PEGFILGRASTIM (PEG fil gra stim) is a long-acting granulocyte colony-stimulating factor that stimulates the growth of neutrophils, a type of white blood cell important in the body's fight against infection. It is used to reduce the incidence of fever and infection in patients with certain types of cancer who are receiving chemotherapy that affects the bone marrow, and to increase survival after being exposed to high doses of radiation.  This medicine may be used for other purposes; ask your health care provider or pharmacist if you have questions.  COMMON BRAND NAME(S): Fulphila, Neulasta, UDENYCA  What should I tell my health care provider before I take this medicine?  They need to know if you have any of these conditions:  -kidney disease  -latex allergy  -ongoing radiation therapy  -sickle cell disease  -skin reactions to acrylic adhesives (On-Body Injector only)  -an unusual or allergic reaction to pegfilgrastim, filgrastim, other medicines, foods, dyes, or preservatives  -pregnant or trying to get pregnant  -breast-feeding  How should I use this medicine?  This medicine is for injection under the skin. If you get this medicine at home, you will be taught how to prepare and give the pre-filled syringe or how to use the On-body Injector. Refer to the patient Instructions for Use for detailed instructions. Use exactly as directed. Tell your healthcare provider immediately if you suspect that the On-body Injector may not have performed as intended or if you suspect the use of the On-body Injector resulted in a missed or partial dose.  It is important that you put your used needles and syringes in a special sharps container. Do not put them in a trash can. If you do not have a sharps container, call your pharmacist or healthcare provider to get one.  Talk to your pediatrician regarding the use of this medicine in children. While this drug may be prescribed for  selected conditions, precautions do apply.  Overdosage: If you think you have taken too much of this medicine contact a poison control center or emergency room at once.  NOTE: This medicine is only for you. Do not share this medicine with others.  What if I miss a dose?  It is important not to miss your dose. Call your doctor or health care professional if you miss your dose. If you miss a dose due to an On-body Injector failure or leakage, a new dose should be administered as soon as possible using a single prefilled syringe for manual use.  What may interact with this medicine?  Interactions have not been studied.  Give your health care provider a list of all the medicines, herbs, non-prescription drugs, or dietary supplements you use. Also tell them if you smoke, drink alcohol, or use illegal drugs. Some items may interact with your medicine.  This list may not describe all possible interactions. Give your health care provider a list of all the medicines, herbs, non-prescription drugs, or dietary supplements you use. Also tell them if you smoke, drink alcohol, or use illegal drugs. Some items may interact with your medicine.  What should I watch for while using this medicine?  You may need blood work done while you are taking this medicine.  If you are going to need a MRI, CT scan, or other procedure, tell your doctor that you are using this medicine (On-Body Injector only).  What side effects may I notice from receiving this medicine?  Side effects that you should report to   your doctor or health care professional as soon as possible:  -allergic reactions like skin rash, itching or hives, swelling of the face, lips, or tongue  -back pain  -dizziness  -fever  -pain, redness, or irritation at site where injected  -pinpoint red spots on the skin  -red or dark-brown urine  -shortness of breath or breathing problems  -stomach or side pain, or pain at the shoulder  -swelling  -tiredness  -trouble passing urine or  change in the amount of urine  Side effects that usually do not require medical attention (report to your doctor or health care professional if they continue or are bothersome):  -bone pain  -muscle pain  This list may not describe all possible side effects. Call your doctor for medical advice about side effects. You may report side effects to FDA at 1-800-FDA-1088.  Where should I keep my medicine?  Keep out of the reach of children.  If you are using this medicine at home, you will be instructed on how to store it. Throw away any unused medicine after the expiration date on the label.  NOTE: This sheet is a summary. It may not cover all possible information. If you have questions about this medicine, talk to your doctor, pharmacist, or health care provider.   2019 Elsevier/Gold Standard (2018-01-04 16:57:08)

## 2018-12-27 ENCOUNTER — Encounter (HOSPITAL_COMMUNITY): Payer: Self-pay

## 2018-12-27 ENCOUNTER — Other Ambulatory Visit: Payer: Self-pay

## 2018-12-27 ENCOUNTER — Emergency Department (HOSPITAL_COMMUNITY): Payer: BC Managed Care – PPO

## 2018-12-27 ENCOUNTER — Inpatient Hospital Stay (HOSPITAL_COMMUNITY)
Admission: EM | Admit: 2018-12-27 | Discharge: 2018-12-29 | DRG: 816 | Disposition: A | Discharge status: Home / Self Care | Payer: BC Managed Care – PPO | Attending: Internal Medicine | Admitting: Internal Medicine

## 2018-12-27 DIAGNOSIS — E860 Dehydration: Secondary | ICD-10-CM | POA: Diagnosis not present

## 2018-12-27 DIAGNOSIS — D72829 Elevated white blood cell count, unspecified: Principal | ICD-10-CM

## 2018-12-27 DIAGNOSIS — Z79899 Other long term (current) drug therapy: Secondary | ICD-10-CM

## 2018-12-27 DIAGNOSIS — R1110 Vomiting, unspecified: Secondary | ICD-10-CM

## 2018-12-27 DIAGNOSIS — C50412 Malignant neoplasm of upper-outer quadrant of left female breast: Secondary | ICD-10-CM | POA: Diagnosis present

## 2018-12-27 DIAGNOSIS — T451X5A Adverse effect of antineoplastic and immunosuppressive drugs, initial encounter: Secondary | ICD-10-CM | POA: Diagnosis present

## 2018-12-27 DIAGNOSIS — Z9221 Personal history of antineoplastic chemotherapy: Secondary | ICD-10-CM

## 2018-12-27 DIAGNOSIS — R112 Nausea with vomiting, unspecified: Secondary | ICD-10-CM | POA: Diagnosis present

## 2018-12-27 DIAGNOSIS — R52 Pain, unspecified: Secondary | ICD-10-CM

## 2018-12-27 DIAGNOSIS — Z170 Estrogen receptor positive status [ER+]: Secondary | ICD-10-CM

## 2018-12-27 DIAGNOSIS — D630 Anemia in neoplastic disease: Secondary | ICD-10-CM | POA: Diagnosis present

## 2018-12-27 DIAGNOSIS — Z8249 Family history of ischemic heart disease and other diseases of the circulatory system: Secondary | ICD-10-CM

## 2018-12-27 DIAGNOSIS — R197 Diarrhea, unspecified: Secondary | ICD-10-CM

## 2018-12-27 DIAGNOSIS — K219 Gastro-esophageal reflux disease without esophagitis: Secondary | ICD-10-CM | POA: Diagnosis present

## 2018-12-27 DIAGNOSIS — I10 Essential (primary) hypertension: Secondary | ICD-10-CM | POA: Diagnosis present

## 2018-12-27 LAB — CBC WITH DIFFERENTIAL/PLATELET
Abs Immature Granulocytes: 0 10*3/uL (ref 0.00–0.07)
Basophils Absolute: 0 10*3/uL (ref 0.0–0.1)
Basophils Relative: 0 %
Eosinophils Absolute: 0 10*3/uL (ref 0.0–0.5)
Eosinophils Relative: 0 %
HEMATOCRIT: 29.9 % — AB (ref 36.0–46.0)
HEMOGLOBIN: 9.4 g/dL — AB (ref 12.0–15.0)
LYMPHS ABS: 0.4 10*3/uL — AB (ref 0.7–4.0)
Lymphocytes Relative: 1 %
MCH: 28.6 pg (ref 26.0–34.0)
MCHC: 31.4 g/dL (ref 30.0–36.0)
MCV: 90.9 fL (ref 80.0–100.0)
Monocytes Absolute: 0 10*3/uL — ABNORMAL LOW (ref 0.1–1.0)
Monocytes Relative: 0 %
Neutro Abs: 38.7 10*3/uL — ABNORMAL HIGH (ref 1.7–7.7)
Neutrophils Relative %: 99 %
Platelets: 148 10*3/uL — ABNORMAL LOW (ref 150–400)
RBC: 3.29 MIL/uL — ABNORMAL LOW (ref 3.87–5.11)
RDW: 14.7 % (ref 11.5–15.5)
WBC: 39.1 10*3/uL — ABNORMAL HIGH (ref 4.0–10.5)
nRBC: 0 % (ref 0.0–0.2)

## 2018-12-27 LAB — COMPREHENSIVE METABOLIC PANEL
ALBUMIN: 3.7 g/dL (ref 3.5–5.0)
ALT: 13 U/L (ref 0–44)
AST: 15 U/L (ref 15–41)
Alkaline Phosphatase: 80 U/L (ref 38–126)
Anion gap: 7 (ref 5–15)
BUN: 17 mg/dL (ref 6–20)
CO2: 27 mmol/L (ref 22–32)
CREATININE: 0.77 mg/dL (ref 0.44–1.00)
Calcium: 9 mg/dL (ref 8.9–10.3)
Chloride: 105 mmol/L (ref 98–111)
GFR calc Af Amer: >60 mL/min (ref >60)
GFR calc non Af Amer: >60 mL/min (ref >60)
Glucose, Bld: 118 mg/dL — ABNORMAL HIGH (ref 70–99)
Potassium: 3.6 mmol/L (ref 3.5–5.1)
Sodium: 139 mmol/L (ref 135–145)
Total Bilirubin: 0.4 mg/dL (ref 0.3–1.2)
Total Protein: 6.4 g/dL — ABNORMAL LOW (ref 6.5–8.1)

## 2018-12-27 LAB — URINALYSIS, ROUTINE W REFLEX MICROSCOPIC
BILIRUBIN URINE: NEGATIVE
Glucose, UA: NEGATIVE mg/dL
Ketones, ur: NEGATIVE mg/dL
Nitrite: NEGATIVE
Protein, ur: NEGATIVE mg/dL
Specific Gravity, Urine: 1.023 (ref 1.005–1.030)
pH: 5 (ref 5.0–8.0)

## 2018-12-27 LAB — TYPE AND SCREEN
ABO/RH(D): B POS
Antibody Screen: NEGATIVE

## 2018-12-27 LAB — ABO/RH: ABO/RH(D): B POS

## 2018-12-27 LAB — LACTIC ACID, PLASMA: Lactic Acid, Venous: 1.2 mmol/L (ref 0.5–1.9)

## 2018-12-27 LAB — LIPASE, BLOOD: Lipase: 32 U/L (ref 11–51)

## 2018-12-27 LAB — TROPONIN I: Troponin I: <0.03 ng/mL (ref <0.03)

## 2018-12-27 LAB — CK: Total CK: 34 U/L — ABNORMAL LOW (ref 38–234)

## 2018-12-27 MED ORDER — SODIUM CHLORIDE 0.9 % IV BOLUS
1000.0000 mL | Freq: Once | INTRAVENOUS | Status: AC
  Administered 2018-12-27: 1000 mL via INTRAVENOUS

## 2018-12-27 MED ORDER — PROMETHAZINE HCL 25 MG/ML IJ SOLN
12.5000 mg | Freq: Once | INTRAMUSCULAR | Status: AC
  Administered 2018-12-27: 12.5 mg via INTRAVENOUS
  Filled 2018-12-27: qty 1

## 2018-12-27 NOTE — ED Triage Notes (Addendum)
Pt coming from home c/o N/V/D that started today. Recent breast cancer dx in Jan, received chemo on Thursday and an "injection" on Saturday.   22 L wrist, 400 mL normal saline and 4 mg zofran

## 2018-12-27 NOTE — ED Notes (Signed)
Pt aware that urine sample is needed. Unable to give right now

## 2018-12-27 NOTE — ED Notes (Signed)
Bed: WA21 Expected date:  Expected time:  Means of arrival:  Comments: EMS N,V

## 2018-12-27 NOTE — ED Provider Notes (Signed)
Emergency Department Provider Note   I have reviewed the triage vital signs and the nursing notes.   HISTORY  Chief Complaint Generalized Body Aches; Nausea; and Emesis   HPI [name] is a 58 y.o. female with PMH of GERD, HTN, and active breast cancer with last chemotherapy on Thursday presents to the emergency department for evaluation of body aches, fatigue, and nausea.  This was her last treatment in this cycle.  She has not had severe symptoms like this in the past after chemotherapy.  She denies any fever, subjective or otherwise.  No runny nose, cough, shortness of breath.  No sick contacts or travel.  She denies abdominal pain but has had nausea and vomiting.  No diarrhea.  No blood in the bowel movements.  No radiation of symptoms or modifying factors.  Past Medical History:  Diagnosis Date  . Cancer [facility])    breast- left  . Complication of anesthesia   . Fatigue   . GERD (gastroesophageal reflux disease)    w nonobstructing esophageal stricture  . Headache   . Heat intolerance   . History of ETT 6/10   myoview EF 65%, normal wall motion, no ischemia or infarction, poor exercise capacity  . HTN (hypertension)   . Motion sickness   . Normal echocardiogram 6/10   EF 97-35% moderate diastolic dysfunction, mild LAE  . Numbness and tingling   . Obesity   . PONV (postoperative nausea and vomiting)   . S/P partial hysterectomy     Patient Active Problem List   Diagnosis Date Noted  . Nausea & vomiting 12/27/2018  . Port-A-Cath in place 11/18/2018  . Malignant neoplasm of upper-outer quadrant of left breast in female, estrogen receptor positive ([place]) 10/26/2018  . Multiple neurological symptoms 03/31/2017  . Pain in back 02/21/2014  . Female stress incontinence 02/21/2014  . Carbuncle 02/21/2014  . Symptomatic menopausal or female climacteric states 02/21/2014  . CONGESTIVE HEART FAILURE 03/22/2009  . CHEST PAIN-PRECORDIAL 03/22/2009  .  HYPERTENSION 07/31/2008  . ABDOMINAL PAIN, UNSPECIFIED SITE 07/31/2008    Past Surgical History:  Procedure Laterality Date  . ABDOMINAL HYSTERECTOMY  2007   partial   . CARPAL TUNNEL RELEASE Bilateral   . FOOT SURGERY Bilateral   . hysterectomy (other)    . PORTACATH PLACEMENT N/A 11/10/2018   Procedure: INSERTION PORT-A-CATH WITH ULTRASOUND;  Surgeon: Erroll Luna, MD;  Location: [place];  Service: General;  Laterality: N/A;  . TONSILLECTOMY  1968   Allergies Patient has no known allergies.  Family History  Problem Relation Age of Onset  . Heart attack Mother 39  . Heart disease Mother   . Heart attack Maternal Grandmother        age 31 or 4  . Heart disease Maternal Grandmother   . Neuropathy Neg Hx     Social History Social History   Tobacco Use  . Smoking status: Never Smoker  . Smokeless tobacco: Never Used  Substance Use Topics  . Alcohol use: No  . Drug use: No    Review of Systems  Constitutional: No fever/chills. Positive weakness and body aches.  Eyes: No visual changes. ENT: No sore throat. Cardiovascular: Denies chest pain. Respiratory: Denies shortness of breath. Gastrointestinal: No abdominal pain. Positive nausea and vomiting.  No diarrhea.  No constipation. Genitourinary: Negative for dysuria. Musculoskeletal: Negative for back pain. Skin: Negative for rash. Neurological: Negative for headaches, focal weakness or numbness.  10-point ROS otherwise negative.  ____________________________________________   PHYSICAL  EXAM:  VITAL SIGNS: ED Triage Vitals  Enc Vitals Group     BP 12/27/18 2041 120/80     Pulse Rate 12/27/18 2041 (!) 113     Resp 12/27/18 2041 16     Temp 12/27/18 2041 98.3 F (36.8 C)     Temp Source 12/27/18 2041 Oral     SpO2 12/27/18 2032 99 %     Weight 12/27/18 2041 270 lb (122.5 kg)     Height 12/27/18 2041 6\' 2"  (1.88 m)     Pain Score 12/27/18 2041 8   Constitutional: Alert and oriented. Well appearing and in  no acute distress. Eyes: Conjunctivae are normal. Head: Atraumatic. Nose: No congestion/rhinnorhea. Mouth/Throat: Mucous membranes are moist.  Neck: No stridor.  Cardiovascular: Normal rate, regular rhythm. Good peripheral circulation. Grossly normal heart sounds.   Respiratory: Normal respiratory effort.  No retractions. Lungs CTAB. Gastrointestinal: Soft and nontender. No distention.  Musculoskeletal: No lower extremity tenderness nor edema. No gross deformities of extremities. Neurologic:  Normal speech and language. No gross focal neurologic deficits are appreciated.  Skin:  Skin is warm, dry and intact. No rash noted.  ____________________________________________   LABS (all labs ordered are listed, but only abnormal results are displayed)  Labs Reviewed  COMPREHENSIVE METABOLIC PANEL - Abnormal; Notable for the following components:      Result Value   Glucose, Bld 118 (*)    Total Protein 6.4 (*)    All other components within normal limits  CBC WITH DIFFERENTIAL/PLATELET - Abnormal; Notable for the following components:   WBC 39.1 (*)    RBC 3.29 (*)    Hemoglobin 9.4 (*)    HCT 29.9 (*)    Platelets 148 (*)    Neutro Abs 38.7 (*)    Lymphs Abs 0.4 (*)    Monocytes Absolute 0.0 (*)    All other components within normal limits  URINALYSIS, ROUTINE W REFLEX MICROSCOPIC - Abnormal; Notable for the following components:   APPearance CLOUDY (*)    Hgb urine dipstick SMALL (*)    Leukocytes,Ua TRACE (*)    Bacteria, UA RARE (*)    All other components within normal limits  CK - Abnormal; Notable for the following components:   Total CK 34 (*)    All other components within normal limits  CULTURE, BLOOD (ROUTINE X 2)  CULTURE, BLOOD (ROUTINE X 2)  URINE CULTURE  LACTIC ACID, PLASMA  LIPASE, BLOOD  TROPONIN I  TYPE AND SCREEN  ABO/RH   ____________________________________________  EKG   EKG Interpretation  Date/Time:  Monday December 27 2018 21:56:41 EDT  Ventricular Rate:  110 PR Interval:  162 QRS Duration: 76 QT Interval:  342 QTC Calculation: 462 R Axis:   21 Text Interpretation:  Sinus tachycardia Otherwise normal ECG No STEMI.  Confirmed by Nanda Quinton [phone]) on 12/27/2018 10:07:10 PM       ____________________________________________  RADIOLOGY  Dg Chest 2 View  Result Date: 12/27/2018 CLINICAL DATA:  History of breast carcinoma. Fatigue and fever for 24 hours. EXAM: CHEST - 2 VIEW COMPARISON:  11/10/2018 and older exams. FINDINGS: The cardiac silhouette is normal in size and configuration. No mediastinal or hilar masses. No evidence of adenopathy. Right anterior chest wall Port-A-Cath extends to the right internal jugular vein, tip projecting in the right atrium, stable. Lungs are clear.  No pleural effusion or pneumothorax. Skeletal structures are intact. IMPRESSION: No active cardiopulmonary disease. Electronically Signed   By: Dedra Skeens.D.  On: 12/27/2018 22:25    ____________________________________________   PROCEDURES  Procedure(s) performed:   Procedures   ____________________________________________   INITIAL IMPRESSION / ASSESSMENT AND PLAN / ED COURSE  Pertinent labs & imaging results that were available during my care of the patient were reviewed by me and considered in my medical decision making (see chart for details).  Patient presents to the emergency department with muscle aches, fatigue, nausea/vomiting.  She is undergoing active chemotherapy. Labs with significant leukocytosis but normal lactate. No abx for now. Patient with tachycardia and clinical dehydration. Plan for observation admit for IVF and following symptoms.   Discussed patient's case with Hospitalist, Dr. Hal Hope to request admission. Patient and family (if present) updated with plan. Care transferred to Hospitalist service.  I reviewed all nursing notes, vitals, pertinent old records, EKGs, labs, imaging (as available).   ____________________________________________  FINAL CLINICAL IMPRESSION(S) / ED DIAGNOSES  Final diagnoses:  Dehydration  Body aches  Non-intractable vomiting with nausea, unspecified vomiting type  Leukocytosis, unspecified type    MEDICATIONS GIVEN DURING THIS VISIT:  Medications  promethazine (PHENERGAN) injection 12.5 mg (12.5 mg Intravenous Given 12/27/18 2151)  sodium chloride 0.9 % bolus 1,000 mL (1,000 mLs Intravenous New Bag/Given 12/27/18 2151)    Note:  This document was prepared using Dragon voice recognition software and may include unintentional dictation errors.  Nanda Quinton, MD Emergency Medicine    Long, Wonda Old[facility], MD 12/27/18 [phone]

## 2018-12-27 NOTE — ED Notes (Signed)
ED TO INPATIENT HANDOFF REPORT  ED Nurse Name and Phone #: Judson Roch, RN [phone]  S Name/Age/Gender [name] 58 y.o. female Room/Bed: WA21/WA21  Code Status   Code Status: Prior  Home/SNF/Other Home Patient oriented to: self Is this baseline? Yes   Triage Complete: Triage complete  Chief Complaint Gen. Weak.N/V  Triage Note Pt coming from home c/o N/V/D that started today. Recent breast cancer dx in Jan, received chemo on Thursday and an "injection" on Saturday.   22 L wrist, 400 mL normal saline and 4 mg zofran   Allergies No Known Allergies  Level of Care/Admitting Diagnosis ED Disposition    ED Disposition Condition Comment   Admit  Hospital Area: [facility] [[id number]]  Level of Care: Telemetry [5]  Admit to tele based on following criteria: Monitor QTC interval  Diagnosis: Nausea & vomiting [426834]  Admitting Physician: Rise Patience [phone]  Attending Physician: Rise Patience [email or web address]  PT Class (Do Not Modify): Observation [104]  PT Acc Code (Do Not Modify): Observation [10022]       B Medical/Surgery History Past Medical History:  Diagnosis Date  . Cancer [facility])    breast- left  . Complication of anesthesia   . Fatigue   . GERD (gastroesophageal reflux disease)    w nonobstructing esophageal stricture  . Headache   . Heat intolerance   . History of ETT 6/10   myoview EF 65%, normal wall motion, no ischemia or infarction, poor exercise capacity  . HTN (hypertension)   . Motion sickness   . Normal echocardiogram 6/10   EF 22-97% moderate diastolic dysfunction, mild LAE  . Numbness and tingling   . Obesity   . PONV (postoperative nausea and vomiting)   . S/P partial hysterectomy    Past Surgical History:  Procedure Laterality Date  . ABDOMINAL HYSTERECTOMY  2007   partial   . CARPAL TUNNEL RELEASE Bilateral   . FOOT SURGERY Bilateral   . hysterectomy (other)    . PORTACATH PLACEMENT N/A  11/10/2018   Procedure: INSERTION PORT-A-CATH WITH ULTRASOUND;  Surgeon: Erroll Luna, MD;  Location: [place];  Service: General;  Laterality: N/A;  . TONSILLECTOMY  1968     A IV Location/Drains/Wounds Patient Lines/Drains/Airways Status   Active Line/Drains/Airways    Name:   Placement date:   Placement time:   Site:   Days:   Implanted Port 11/10/18 Chest   11/10/18    -    Chest   47   Peripheral IV 12/27/18 Left Wrist   12/27/18    2034    Wrist   less than 1   Peripheral IV 12/27/18 Right Antecubital   12/27/18    2138    Antecubital   less than 1   Incision (Closed) 11/10/18 Other (Comment) Right   11/10/18    0810     47          Intake/Output Last 24 hours No intake or output data in the 24 hours ending 12/27/18 2334  Labs/Imaging Results for orders placed or performed during the hospital encounter of 12/27/18 (from the past 48 hour(s))  Urinalysis, Routine w reflex microscopic     Status: Abnormal   Collection Time: 12/27/18  9:22 PM  Result Value Ref Range   Color, Urine YELLOW YELLOW   APPearance CLOUDY (A) CLEAR   Specific Gravity, Urine 1.023 1.005 - 1.030   pH 5.0 5.0 - 8.0   Glucose, UA NEGATIVE NEGATIVE mg/dL  Hgb urine dipstick SMALL (A) NEGATIVE   Bilirubin Urine NEGATIVE NEGATIVE   Ketones, ur NEGATIVE NEGATIVE mg/dL   Protein, ur NEGATIVE NEGATIVE mg/dL   Nitrite NEGATIVE NEGATIVE   Leukocytes,Ua TRACE (A) NEGATIVE   RBC / HPF 0-5 0 - 5 RBC/hpf   WBC, UA 11-20 0 - 5 WBC/hpf   Bacteria, UA RARE (A) NONE SEEN   Squamous Epithelial / LPF 0-5 0 - 5   Mucus PRESENT    Uric Acid Crys, UA PRESENT     Comment: Performed at [facility], [place] [address] [place], [place] [address]  Lactic acid, plasma     Status: None   Collection Time: 12/27/18  9:36 PM  Result Value Ref Range   Lactic Acid, Venous 1.2 0.5 - 1.9 mmol/L    Comment: Performed at [facility], [place] [address] [place], [place] [address]  Comprehensive  metabolic panel     Status: Abnormal   Collection Time: 12/27/18  9:36 PM  Result Value Ref Range   Sodium 139 135 - 145 mmol/L   Potassium 3.6 3.5 - 5.1 mmol/L   Chloride 105 98 - 111 mmol/L   CO2 27 22 - 32 mmol/L   Glucose, Bld 118 (H) 70 - 99 mg/dL   BUN 17 6 - 20 mg/dL   Creatinine, Ser 0.77 0.44 - 1.00 mg/dL   Calcium 9.0 8.9 - 10.3 mg/dL   Total Protein 6.4 (L) 6.5 - 8.1 g/dL   Albumin 3.7 3.5 - 5.0 g/dL   AST 15 15 - 41 U/L   ALT 13 0 - 44 U/L   Alkaline Phosphatase 80 38 - 126 U/L   Total Bilirubin 0.4 0.3 - 1.2 mg/dL   GFR calc non Af Amer >60 >60 mL/min   GFR calc Af Amer >60 >60 mL/min   Anion gap 7 5 - 15    Comment: Performed at [facility], [place] [address] [place], [place] [address]  CBC WITH DIFFERENTIAL     Status: Abnormal   Collection Time: 12/27/18  9:36 PM  Result Value Ref Range   WBC 39.1 (H) 4.0 - 10.5 K/uL   RBC 3.29 (L) 3.87 - 5.11 MIL/uL   Hemoglobin 9.4 (L) 12.0 - 15.0 g/dL   HCT 29.9 (L) 36.0 - 46.0 %   MCV 90.9 80.0 - 100.0 fL   MCH 28.6 26.0 - 34.0 pg   MCHC 31.4 30.0 - 36.0 g/dL   RDW 14.7 11.5 - 15.5 %   Platelets 148 (L) 150 - 400 K/uL   nRBC 0.0 0.0 - 0.2 %   Neutrophils Relative % 99 %   Neutro Abs 38.7 (H) 1.7 - 7.7 K/uL   Lymphocytes Relative 1 %   Lymphs Abs 0.4 (L) 0.7 - 4.0 K/uL   Monocytes Relative 0 %   Monocytes Absolute 0.0 (L) 0.1 - 1.0 K/uL   Eosinophils Relative 0 %   Eosinophils Absolute 0.0 0.0 - 0.5 K/uL   Basophils Relative 0 %   Basophils Absolute 0.0 0.0 - 0.1 K/uL   RBC Morphology MORPHOLOGY UNREMARKABLE    Abs Immature Granulocytes 0.00 0.00 - 0.07 K/uL   Dohle Bodies PRESENT     Comment: Performed at [facility], [place] [address], [place], [place] [address]  CK     Status: Abnormal   Collection Time: 12/27/18  9:36 PM  Result Value Ref Range   Total CK 34 (L) 38 - 234 U/L  Comment: Performed at [facility], [place] [address], [place], [place] [address]   Lipase, blood     Status: None   Collection Time: 12/27/18  9:36 PM  Result Value Ref Range   Lipase 32 11 - 51 U/L    Comment: Performed at [facility], [place] [address], [place], [place] [address]  Troponin I - Once     Status: None   Collection Time: 12/27/18  9:36 PM  Result Value Ref Range   Troponin I <0.03 <0.03 ng/mL    Comment: Performed at [facility], [place] [address] [place], [place] [address]  Type and screen [place]     Status: None   Collection Time: 12/27/18  9:37 PM  Result Value Ref Range   ABO/RH(D) B POS    Antibody Screen NEG    Sample Expiration      12/30/2018 Performed at [facility], [place] [address], [place], [place] [address]   ABO/Rh     Status: None (Preliminary result)   Collection Time: 12/27/18  9:37 PM  Result Value Ref Range   ABO/RH(D)      B POS Performed at [facility], [place] [address], [place],  [address]    Dg Chest 2 View  Result Date: 12/27/2018 CLINICAL DATA:  History of breast carcinoma. Fatigue and fever for 24 hours. EXAM: CHEST - 2 VIEW COMPARISON:  11/10/2018 and older exams. FINDINGS: The cardiac silhouette is normal in size and configuration. No mediastinal or hilar masses. No evidence of adenopathy. Right anterior chest wall Port-A-Cath extends to the right internal jugular vein, tip projecting in the right atrium, stable. Lungs are clear.  No pleural effusion or pneumothorax. Skeletal structures are intact. IMPRESSION: No active cardiopulmonary disease. Electronically Signed   By: Lajean Manes M.D.   On: 12/27/2018 22:25    Pending Labs Unresulted Labs (From admission, onward)    Start     Ordered   12/27/18 2103  Blood Culture (routine x 2)  BLOOD CULTURE X 2,   STAT    Question:  Patient immune status  Answer:  Normal   12/27/18 2105   12/27/18 2103  Urine culture  ONCE - STAT,   STAT    Question:  Patient immune status   Answer:  Normal   12/27/18 2105          Vitals/Pain Today's Vitals   12/27/18 2032 12/27/18 2041 12/27/18 2230  BP:  120/80 117/82  Pulse:  (!) 113 (!) 105  Resp:  16 19  Temp:  98.3 F (36.8 C)   TempSrc:  Oral   SpO2: 99% 99% 99%  Weight:  122.5 kg   Height:  6\' 2"  (1.88 m)   PainSc:  8      Isolation Precautions No active isolations  Medications Medications  promethazine (PHENERGAN) injection 12.5 mg (12.5 mg Intravenous Given 12/27/18 2151)  sodium chloride 0.9 % bolus 1,000 mL (1,000 mLs Intravenous New Bag/Given 12/27/18 2151)    Mobility walks with person assist Low fall risk   Focused Assessments Neuro Assessment Handoff:  Swallow screen pass? Yes          Neuro Assessment:   Neuro Checks:      Last Documented NIHSS Modified Score:   Has TPA been given? No If patient is a Neuro Trauma and patient is going to OR before floor call report to [place] nurse: [phone] or [phone]  R Recommendations: See Admitting Provider Note  Report given to:   Additional Notes:

## 2018-12-28 ENCOUNTER — Encounter (HOSPITAL_COMMUNITY): Payer: Self-pay | Admitting: Internal Medicine

## 2018-12-28 DIAGNOSIS — D72829 Elevated white blood cell count, unspecified: Principal | ICD-10-CM

## 2018-12-28 DIAGNOSIS — Z8249 Family history of ischemic heart disease and other diseases of the circulatory system: Secondary | ICD-10-CM | POA: Diagnosis not present

## 2018-12-28 DIAGNOSIS — D630 Anemia in neoplastic disease: Secondary | ICD-10-CM | POA: Diagnosis present

## 2018-12-28 DIAGNOSIS — R197 Diarrhea, unspecified: Secondary | ICD-10-CM | POA: Diagnosis not present

## 2018-12-28 DIAGNOSIS — Z79899 Other long term (current) drug therapy: Secondary | ICD-10-CM | POA: Diagnosis not present

## 2018-12-28 DIAGNOSIS — I10 Essential (primary) hypertension: Secondary | ICD-10-CM

## 2018-12-28 DIAGNOSIS — T451X5A Adverse effect of antineoplastic and immunosuppressive drugs, initial encounter: Secondary | ICD-10-CM | POA: Diagnosis present

## 2018-12-28 DIAGNOSIS — K219 Gastro-esophageal reflux disease without esophagitis: Secondary | ICD-10-CM | POA: Diagnosis present

## 2018-12-28 DIAGNOSIS — C50412 Malignant neoplasm of upper-outer quadrant of left female breast: Secondary | ICD-10-CM | POA: Diagnosis present

## 2018-12-28 DIAGNOSIS — Z9221 Personal history of antineoplastic chemotherapy: Secondary | ICD-10-CM | POA: Diagnosis not present

## 2018-12-28 DIAGNOSIS — E860 Dehydration: Secondary | ICD-10-CM | POA: Diagnosis not present

## 2018-12-28 DIAGNOSIS — R112 Nausea with vomiting, unspecified: Secondary | ICD-10-CM | POA: Diagnosis present

## 2018-12-28 DIAGNOSIS — Z170 Estrogen receptor positive status [ER+]: Secondary | ICD-10-CM | POA: Diagnosis not present

## 2018-12-28 LAB — CBC WITH DIFFERENTIAL/PLATELET
Abs Immature Granulocytes: 4.23 10*3/uL — ABNORMAL HIGH (ref 0.00–0.07)
Basophils Absolute: 0.1 10*3/uL (ref 0.0–0.1)
Basophils Relative: 1 %
Eosinophils Absolute: 0 10*3/uL (ref 0.0–0.5)
Eosinophils Relative: 0 %
HCT: 26.8 % — ABNORMAL LOW (ref 36.0–46.0)
Hemoglobin: 8.2 g/dL — ABNORMAL LOW (ref 12.0–15.0)
Immature Granulocytes: 17 %
Lymphocytes Relative: 2 %
Lymphs Abs: 0.4 10*3/uL — ABNORMAL LOW (ref 0.7–4.0)
MCH: 28.5 pg (ref 26.0–34.0)
MCHC: 30.6 g/dL (ref 30.0–36.0)
MCV: 93.1 fL (ref 80.0–100.0)
Monocytes Absolute: 0.2 10*3/uL (ref 0.1–1.0)
Monocytes Relative: 1 %
Neutro Abs: 20.3 10*3/uL — ABNORMAL HIGH (ref 1.7–7.7)
Neutrophils Relative %: 79 %
Platelets: 131 10*3/uL — ABNORMAL LOW (ref 150–400)
RBC: 2.88 MIL/uL — ABNORMAL LOW (ref 3.87–5.11)
RDW: 14.7 % (ref 11.5–15.5)
WBC: 25.2 10*3/uL — ABNORMAL HIGH (ref 4.0–10.5)
nRBC: 0 % (ref 0.0–0.2)

## 2018-12-28 LAB — BASIC METABOLIC PANEL
Anion gap: 6 (ref 5–15)
BUN: 13 mg/dL (ref 6–20)
CO2: 25 mmol/L (ref 22–32)
Calcium: 8.7 mg/dL — ABNORMAL LOW (ref 8.9–10.3)
Chloride: 111 mmol/L (ref 98–111)
Creatinine, Ser: 0.75 mg/dL (ref 0.44–1.00)
GFR calc Af Amer: >60 mL/min (ref >60)
GFR calc non Af Amer: >60 mL/min (ref >60)
GLUCOSE: 106 mg/dL — AB (ref 70–99)
Potassium: 3.9 mmol/L (ref 3.5–5.1)
Sodium: 142 mmol/L (ref 135–145)

## 2018-12-28 LAB — HEPATIC FUNCTION PANEL
ALT: 11 U/L (ref 0–44)
AST: 13 U/L — ABNORMAL LOW (ref 15–41)
Albumin: 3.1 g/dL — ABNORMAL LOW (ref 3.5–5.0)
Alkaline Phosphatase: 69 U/L (ref 38–126)
BILIRUBIN DIRECT: 0.1 mg/dL (ref 0.0–0.2)
Indirect Bilirubin: 0.3 mg/dL (ref 0.3–0.9)
Total Bilirubin: 0.4 mg/dL (ref 0.3–1.2)
Total Protein: 5.6 g/dL — ABNORMAL LOW (ref 6.5–8.1)

## 2018-12-28 LAB — HIV ANTIBODY (ROUTINE TESTING W REFLEX): HIV SCREEN 4TH GENERATION: NONREACTIVE

## 2018-12-28 MED ORDER — ENOXAPARIN SODIUM 60 MG/0.6ML ~~LOC~~ SOLN
60.0000 mg | SUBCUTANEOUS | Status: DC
  Administered 2018-12-28 – 2018-12-29 (×2): 60 mg via SUBCUTANEOUS
  Filled 2018-12-28 (×2): qty 0.6

## 2018-12-28 MED ORDER — LACTATED RINGERS IV SOLN
INTRAVENOUS | Status: DC
  Administered 2018-12-28 – 2018-12-29 (×3): via INTRAVENOUS

## 2018-12-28 MED ORDER — ACETAMINOPHEN 650 MG RE SUPP: 650.0000 mg | Freq: Four times a day (QID) | RECTAL | Status: DC | PRN

## 2018-12-28 MED ORDER — PANTOPRAZOLE SODIUM 40 MG PO TBEC
40.0000 mg | DELAYED_RELEASE_TABLET | Freq: Every day | ORAL | Status: DC
  Administered 2018-12-28 – 2018-12-29 (×2): 40 mg via ORAL
  Filled 2018-12-28 (×3): qty 1

## 2018-12-28 MED ORDER — OXYCODONE HCL 5 MG PO TABS: 5.0000 mg | ORAL_TABLET | Freq: Four times a day (QID) | ORAL | Status: DC | PRN

## 2018-12-28 MED ORDER — FAMOTIDINE 20 MG PO TABS
20.0000 mg | ORAL_TABLET | Freq: Two times a day (BID) | ORAL | Status: DC
  Administered 2018-12-28 – 2018-12-29 (×3): 20 mg via ORAL
  Filled 2018-12-28 (×3): qty 1

## 2018-12-28 MED ORDER — ACETAMINOPHEN 325 MG PO TABS
650.0000 mg | ORAL_TABLET | Freq: Four times a day (QID) | ORAL | Status: DC | PRN
  Administered 2018-12-29: 650 mg via ORAL
  Filled 2018-12-28: qty 2

## 2018-12-28 MED ORDER — ONDANSETRON HCL 4 MG PO TABS: 4.0000 mg | ORAL_TABLET | Freq: Four times a day (QID) | ORAL | Status: DC | PRN

## 2018-12-28 MED ORDER — CARVEDILOL 6.25 MG PO TABS
6.2500 mg | ORAL_TABLET | Freq: Two times a day (BID) | ORAL | Status: DC
  Administered 2018-12-28 – 2018-12-29 (×3): 6.25 mg via ORAL
  Filled 2018-12-28 (×3): qty 1

## 2018-12-28 MED ORDER — ONDANSETRON HCL 4 MG/2ML IJ SOLN
4.0000 mg | Freq: Four times a day (QID) | INTRAMUSCULAR | Status: DC | PRN
  Administered 2018-12-28: 4 mg via INTRAVENOUS
  Filled 2018-12-28: qty 2

## 2018-12-28 NOTE — Progress Notes (Addendum)
MEWS Guidelines - (patients age 58 and over)  Red - At High Risk for Deterioration Yellow - At risk for Deterioration  1. Go to room and assess patient 2. Validate data. Is this patient's baseline? If data confirmed: 3. Is this an acute change? 4. Administer prn meds/treatments as ordered. 5. Note Sepsis score 6. Review goals of care 7. Sports coach, RRT nurse and Provider. 8. Ask Provider to come to bedside.  9. Document patient condition/interventions/response. 10. Increase frequency of vital signs and focused assessments to at least q15 minutes x 4, then q30 minutes x2. - If stable, then q1h x3, then q4h x3 and then q8h or dept. routine. - If unstable, contact Provider & RRT nurse. Prepare for possible transfer. 11. Add entry in progress notes using the smart phrase ".MEWS". 1. Go to room and assess patient 2. Validate data. Is this patient's baseline? If data confirmed: 3. Is this an acute change? 4. Administer prn meds/treatments as ordered? 5. Note Sepsis score 6. Review goals of care 7. Sports coach and Provider 8. Call RRT nurse as needed. 9. Document patient condition/interventions/response. 10. Increase frequency of vital signs and focused assessments to at least q2h x2. - If stable, then q4h x2 and then q8h or dept. routine. - If unstable, contact Provider & RRT nurse. Prepare for possible transfer. 11. Add entry in progress notes using the smart phrase ".MEWS".  Green - Likely stable Lavender - Comfort Care Only  1. Continue routine/ordered monitoring.  2. Review goals of care. 1. Continue routine/ordered monitoring. 2. Review goals of care.   Pt has had an elevated HR since ED arrival secondary dehydration.

## 2018-12-28 NOTE — H&P (Signed)
History and Physical    [name] WCB:[id number] DOB: December 02, 1960 DOA: 12/27/2018  PCP: [name], NP  Patient coming from: Home.  Chief Complaint: Nausea vomiting and diarrhea.  HPI: [name] is a 58 y.o. female with history of breast cancer, hypertension presents to the ER with 3 days of having weakness not feeling well and nausea vomiting and also had some diarrhea.  Patient's last chemotherapy was on Thursday 4 days ago.  And about 24 to 48 hours later start developing diarrhea one episode and feeling weak and nauseated and yesterday had at least 3 episodes of vomiting unable to keep anything.  Denies any abdominal pain denies any fever chills or chest pain or shortness of breath or fever chills.  ED Course: In the ER the patient WBC count was found to be around 39,000 with hemoglobin of 9.4 platelets 148.  Patient was tachycardic.  Abdomen appears benign.  LFTs were normal.  Urine showed some bacteria and WBC nitrites were negative.  Leukocyte esterase was positive.  Patient admitted for possible dehydration.  Review of Systems: As per HPI, rest all negative.   Past Medical History:  Diagnosis Date  . Cancer [facility])    breast- left  . Complication of anesthesia   . Fatigue   . GERD (gastroesophageal reflux disease)    w nonobstructing esophageal stricture  . Headache   . Heat intolerance   . History of ETT 6/10   myoview EF 65%, normal wall motion, no ischemia or infarction, poor exercise capacity  . HTN (hypertension)   . Motion sickness   . Normal echocardiogram 6/10   EF 61-60% moderate diastolic dysfunction, mild LAE  . Numbness and tingling   . Obesity   . PONV (postoperative nausea and vomiting)   . S/P partial hysterectomy     Past Surgical History:  Procedure Laterality Date  . ABDOMINAL HYSTERECTOMY  2007   partial   . CARPAL TUNNEL RELEASE Bilateral   . FOOT SURGERY Bilateral   . hysterectomy (other)    . PORTACATH  PLACEMENT N/A 11/10/2018   Procedure: INSERTION PORT-A-CATH WITH ULTRASOUND;  Surgeon: Erroll Luna, MD;  Location: [place];  Service: General;  Laterality: N/A;  . TONSILLECTOMY  1968     reports that she has never smoked. She has never used smokeless tobacco. She reports that she does not drink alcohol or use drugs.  No Known Allergies  Family History  Problem Relation Age of Onset  . Heart attack Mother 55  . Heart disease Mother   . Heart attack Maternal Grandmother        age 81 or 76  . Heart disease Maternal Grandmother   . Neuropathy Neg Hx     Prior to Admission medications   Medication Sig Start Date End Date Taking? Authorizing Provider  acetaminophen (TYLENOL) 500 MG tablet Take 1,000 mg by mouth every 6 (six) hours as needed (pain after Neulasta injection.).   Yes [provider]  carvedilol (COREG) 6.25 MG tablet TAKE 1 TABLET BY MOUTH TWICE DAILY Patient taking differently: Take 6.25 mg by mouth 2 (two) times daily with a meal.  03/03/15  Yes Shelly Bombard, MD  dexamethasone (DECADRON) 4 MG tablet Take 1 tablet day after chemo and 1 tablet 2 days after chemo with food 11/25/18  Yes Causey, Charlestine Massed, NP  ibuprofen (ADVIL,MOTRIN) 800 MG tablet Take 1 tablet (800 mg total) by mouth every 8 (eight) hours as needed. Patient taking differently: Take 800  mg by mouth every 8 (eight) hours as needed (pain.).  11/10/18  Yes Cornett, [name], MD  lidocaine-prilocaine (EMLA) cream Apply to affected area once 11/01/18  Yes Nicholas Lose, MD  lisinopril-hydrochlorothiazide (PRINZIDE,ZESTORETIC) 20-12.5 MG per tablet TAKE 2 TABLETS BY MOUTH DAILY Patient taking differently: Take 1 tablet by mouth daily.  03/03/15  Yes Shelly Bombard, MD  loratadine (CLARITIN) 10 MG tablet Take 10 mg by mouth as needed (pain after Neulasta injection.).   Yes [provider]  ondansetron (ZOFRAN) 8 MG tablet Take 1 tablet (8 mg total) by mouth 2 (two) times daily as needed.  Start on the third day after chemotherapy. 11/25/18  Yes Causey, Charlestine Massed, NP  oxyCODONE (OXY IR/ROXICODONE) 5 MG immediate release tablet Take 1 tablet (5 mg total) by mouth every 6 (six) hours as needed for severe pain. 11/10/18  Yes Cornett, [name], MD  pantoprazole (PROTONIX) 40 MG tablet Take 1 tablet (40 mg total) by mouth daily. 12/09/18  Yes Nicholas Lose, MD  prochlorperazine (COMPAZINE) 10 MG tablet Take 1 tablet (10 mg total) by mouth every 6 (six) hours as needed (Nausea or vomiting). 11/25/18  Yes Causey, Charlestine Massed, NP  ranitidine (ZANTAC) 150 MG tablet Take 150 mg by mouth daily as needed for heartburn.   Yes [provider]  ibuprofen (ADVIL,MOTRIN) 600 MG tablet Take 1 tablet (600 mg total) by mouth every 6 (six) hours as needed. Patient not taking: Reported on 12/27/2018 01/12/15   Julianne Rice, MD    Physical Exam: Vitals:   12/27/18 2041 12/27/18 2230 12/28/18 0000 12/28/18 0042  BP: 120/80 117/82 111/64 131/77  Pulse: (!) 113 (!) 105 (!) 105 (!) 113  Resp: 16 19 16 16   Temp: 98.3 F (36.8 C)   98.3 F (36.8 C)  TempSrc: Oral   Oral  SpO2: 99% 99% 97% 100%  Weight: 122.5 kg     Height: 6\' 2"  (1.88 m)         Constitutional: Moderately built and nourished. Vitals:   12/27/18 2041 12/27/18 2230 12/28/18 0000 12/28/18 0042  BP: 120/80 117/82 111/64 131/77  Pulse: (!) 113 (!) 105 (!) 105 (!) 113  Resp: 16 19 16 16   Temp: 98.3 F (36.8 C)   98.3 F (36.8 C)  TempSrc: Oral   Oral  SpO2: 99% 99% 97% 100%  Weight: 122.5 kg     Height: 6\' 2"  (1.88 m)      Eyes: Anicteric no pallor. ENMT: No discharge from the ears eyes nose and mouth. Neck: No mass felt.  No neck rigidity. Respiratory: No rhonchi or crepitations. Cardiovascular: S1-S2 heard. Abdomen: Soft nontender bowel sounds present. Musculoskeletal: No edema.  No joint effusion. Skin: No rash. Neurologic: Alert awake oriented to time place and person.  Moves all extremities.  Psychiatric: Appears normal per normal affect.   Labs on Admission: I have personally reviewed following labs and imaging studies  CBC: Recent Labs  Lab 12/23/18 1322 12/27/18 2136  WBC 11.6* 39.1*  NEUTROABS 8.7* 38.7*  HGB 8.9* 9.4*  HCT 27.9* 29.9*  MCV 88.0 90.9  PLT 139* 536*   Basic Metabolic Panel: Recent Labs  Lab 12/23/18 1322 12/27/18 2136  NA 141 139  K 3.3* 3.6  CL 105 105  CO2 24 27  GLUCOSE 138* 118*  BUN 13 17  CREATININE 0.91 0.77  CALCIUM 9.1 9.0   GFR: Estimated Creatinine Clearance: 115.7 mL/min (by C-G formula based on SCr of 0.77 mg/dL). Liver Function Tests: Recent  Labs  Lab 12/23/18 1322 12/27/18 2136  AST 11* 15  ALT 9 13  ALKPHOS 68 80  BILITOT 0.4 0.4  PROT 6.6 6.4*  ALBUMIN 3.4* 3.7   Recent Labs  Lab 12/27/18 2136  LIPASE 32   No results for input(s): AMMONIA in the last 168 hours. Coagulation Profile: No results for input(s): INR, PROTIME in the last 168 hours. Cardiac Enzymes: Recent Labs  Lab 12/27/18 2136  CKTOTAL 34*  TROPONINI <0.03   BNP (last 3 results) No results for input(s): PROBNP in the last 8760 hours. HbA1C: No results for input(s): HGBA1C in the last 72 hours. CBG: No results for input(s): GLUCAP in the last 168 hours. Lipid Profile: No results for input(s): CHOL, HDL, LDLCALC, TRIG, CHOLHDL, LDLDIRECT in the last 72 hours. Thyroid Function Tests: No results for input(s): TSH, T4TOTAL, FREET4, T3FREE, THYROIDAB in the last 72 hours. Anemia Panel: No results for input(s): VITAMINB12, FOLATE, FERRITIN, TIBC, IRON, RETICCTPCT in the last 72 hours. Urine analysis:    Component Value Date/Time   COLORURINE YELLOW 12/27/2018 2122   APPEARANCEUR CLOUDY (A) 12/27/2018 2122   APPEARANCEUR Clear 07/07/2014 0204   LABSPEC 1.023 12/27/2018 2122   LABSPEC 1.011 07/07/2014 0204   PHURINE 5.0 12/27/2018 2122   GLUCOSEU NEGATIVE 12/27/2018 2122   GLUCOSEU Negative 07/07/2014 0204   HGBUR SMALL (A)  12/27/2018 2122   BILIRUBINUR NEGATIVE 12/27/2018 2122   BILIRUBINUR Negative 07/07/2014 0204   KETONESUR NEGATIVE 12/27/2018 2122   PROTEINUR NEGATIVE 12/27/2018 2122   UROBILINOGEN 0.2 08/17/2007 1555   NITRITE NEGATIVE 12/27/2018 2122   LEUKOCYTESUR TRACE (A) 12/27/2018 2122   LEUKOCYTESUR Negative 07/07/2014 0204   Sepsis Labs: @LABRCNTIP (procalcitonin:4,lacticidven:4) )No results found for this or any previous visit (from the past 240 hour(s)).   Radiological Exams on Admission: Dg Chest 2 View  Result Date: 12/27/2018 CLINICAL DATA:  History of breast carcinoma. Fatigue and fever for 24 hours. EXAM: CHEST - 2 VIEW COMPARISON:  11/10/2018 and older exams. FINDINGS: The cardiac silhouette is normal in size and configuration. No mediastinal or hilar masses. No evidence of adenopathy. Right anterior chest wall Port-A-Cath extends to the right internal jugular vein, tip projecting in the right atrium, stable. Lungs are clear.  No pleural effusion or pneumothorax. Skeletal structures are intact. IMPRESSION: No active cardiopulmonary disease. Electronically Signed   By: Lajean Manes M.D.   On: 12/27/2018 22:25      Assessment/Plan Active Problems:   Essential hypertension   Non-intractable vomiting   Leukocytosis   Dehydration   Nausea vomiting and diarrhea    1. Dehydration from nausea vomiting and diarrhea -cause not clear.  Could be from chemotherapy or viral gastroenteritis.  Patient has not taken any recent antibiotics or not come in contact with any sick contacts or recent travel.  Will check GI pathogen panel and stool for C. difficile.  Continue with hydration.  Hold hydrochlorothiazide and lisinopril for now. 2. Leukocytosis -could be from Neulasta and Decadron.  Patient afebrile.  Follow blood cultures and urine cultures.  Continue with hydration. 3. Anemia and thrombocytopenia could be from chemotherapy.  Follow CBC. 4. Hypertension we will continue Coreg with hold  lisinopril and hydrochlorothiazide due to dehydration PRN IV hydralazine. 5. Breast cancer recent chemotherapy.  Per oncology.   DVT prophylaxis: Lovenox. Code Status: Full code. Family Communication: Discussed with patient. Disposition Plan: Home. Consults called: None. Admission status: Observation.   Rise Patience MD [facility] Pager [phone].  If 7PM-7AM, please contact  night-coverage [email or web address] Password [email or web address]  12/28/2018, 1:29 AM

## 2018-12-28 NOTE — Progress Notes (Signed)
Arrived from the ED at [place], pt stood to transfer from stretcher to bed & then ambulated to the BR and voiced how tiring that was. Dr. Hal Hope arrived to bedside.

## 2018-12-28 NOTE — Progress Notes (Signed)
Patient was seen and examined.  Admitted early morning by nighttime hospitalist with nausea, vomiting and diarrhea after receiving chemotherapy for breast cancer on 12/24/2018, she received Neulasta on 12/26/2018. Her diarrhea has mostly improved, no bowel movement for last 24 hours. Advanced her diet, evaluated for discharge, patient is still feeling nauseated on ambulation. Plan: Continue IV fluids.  Continue IV and oral nausea medications.  No indication for antibiotics at this time.  Since patient has no diarrhea, will discontinue enteric precautions.  WBC count is probably due to Neulasta.  After IV fluid hydration, her WBC count has come down as well as her hemoglobin has come down to her baseline.

## 2018-12-29 DIAGNOSIS — T451X5A Adverse effect of antineoplastic and immunosuppressive drugs, initial encounter: Secondary | ICD-10-CM

## 2018-12-29 LAB — URINE CULTURE
Culture: 30000 — AB
Special Requests: NORMAL

## 2018-12-29 NOTE — TOC Transition Note (Signed)
Transition of Care [facility]) - CM/SW Discharge Note   Patient Details  Name: Brightyn Mozer MRN: [id number] Date of Birth: 08-17-1961  [facility] CM/SW Contact:  Leeroy Cha, RN Phone Number: 12/29/2018, 10:26 AM   Clinical Narrative:    Discharged to home with no needs   Final next level of care: Home/Self Care Barriers to Discharge: No Barriers Identified   Patient Goals and CMS Choice Patient states their goals for this hospitalization and ongoing recovery are:: i want to go I am feeling much better      Discharge Placement  home with spouse and family                     Discharge Plan and Services Discharge Planning Services: CM Consult Post Acute Care Choice: NA                    Social Determinants of Health (SDOH) Interventions     Readmission Risk Interventions No flowsheet data found.

## 2018-12-29 NOTE — Discharge Summary (Signed)
Physician Discharge Summary  [name] WNI:[id number] DOB: 01-06-1961 DOA: 12/27/2018  PCP: [name], NP  Admit date: 12/27/2018 Discharge date: 12/29/2018  Admitted From: Home. Disposition: Home.  Recommendations for Outpatient Follow-up:  1. Follow up with oncology as scheduled. 2. Please obtain BMP/CBC in one week 3. Please follow up on the following pending results: Blood cultures.  Home Health: Not applicable. Equipment/Devices: Not applicable.  Discharge Condition: Stable. CODE STATUS: Full code. Diet recommendation: Regular diet, small portions and more frequent meals.  Can use antinausea medications before eating.  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  She received her chemotherapy on 12/24/2018, received Neulasta on 12/26/2018.  Started feeling aggravated nausea, unable to eat and had multiple episodes of loose watery stool next day after receiving Neulasta.  On arrival in the ER, she was found dehydrated, leukocytosis with 38,000, however just had received Neulasta.  She was afebrile.  She was tachycardic.  Discharge Diagnoses:  Active Problems:   Essential hypertension   Non-intractable vomiting   Leukocytosis   Dehydration   Nausea vomiting and diarrhea   Chemotherapy induced nausea and vomiting  Intractable nausea vomiting and diarrhea: Probably chemotherapy-induced nausea and vomiting.  Patient had evidence of clinical dehydration.  She was admitted to the hospital and treated with IV fluids, blood cultures were drawn that were negative.  She was treated with multiple nausea medications.  Currently stabilized.  She does still have occasional nausea but was able to eat full meal with the use of oral medications.  She has no bowel movement for last 48 hours.  Since he is symptomatically improved, she is going home with nausea medications.  Her hemoglobin is 8.2 which is at  about her baseline.  Her white count is 28,000 with no evidence of infection which is probably due to Neulasta. She is going home today.  She will follow-up with oncology.  I notified her oncology about her admission in progress.  Abnormal urine cultures: Blood cultures negative.  Urine cultures grew 30,000 colonies of E. coli that is pansensitive.  Patient has no evidence of UTI, no urinary symptoms.  This is insignificant growth, will not treat with antibiotics.  Discharge Instructions  Discharge Instructions    Call MD for:  persistant dizziness or light-headedness   Complete by:  As directed    Call MD for:  persistant nausea and vomiting   Complete by:  As directed    Call MD for:  temperature >100.4   Complete by:  As directed    Diet - low sodium heart healthy   Complete by:  As directed    Discharge instructions   Complete by:  As directed    Frequent small portion meals Take nausea medicine before meals until you have symptoms.   Increase activity slowly   Complete by:  As directed      Allergies as of 12/29/2018   No Known Allergies     Medication List    STOP taking these medications   ibuprofen 600 MG tablet Commonly known as:  ADVIL,MOTRIN   ibuprofen 800 MG tablet Commonly known as:  ADVIL,MOTRIN     TAKE these medications   acetaminophen 500 MG tablet Commonly known as:  TYLENOL Take 1,000 mg by mouth every 6 (six) hours as needed (pain after Neulasta injection.).   carvedilol 6.25 MG tablet Commonly known as:  COREG TAKE 1 TABLET BY MOUTH TWICE DAILY What changed:  when to take this  dexamethasone 4 MG tablet Commonly known as:  DECADRON Take 1 tablet day after chemo and 1 tablet 2 days after chemo with food   lidocaine-prilocaine cream Commonly known as:  EMLA Apply to affected area once   lisinopril-hydrochlorothiazide 20-12.5 MG tablet Commonly known as:  PRINZIDE,ZESTORETIC TAKE 2 TABLETS BY MOUTH DAILY What changed:  how much to take    loratadine 10 MG tablet Commonly known as:  CLARITIN Take 10 mg by mouth as needed (pain after Neulasta injection.).   ondansetron 8 MG tablet Commonly known as:  Zofran Take 1 tablet (8 mg total) by mouth 2 (two) times daily as needed. Start on the third day after chemotherapy.   oxyCODONE 5 MG immediate release tablet Commonly known as:  Oxy IR/ROXICODONE Take 1 tablet (5 mg total) by mouth every 6 (six) hours as needed for severe pain.   pantoprazole 40 MG tablet Commonly known as:  Protonix Take 1 tablet (40 mg total) by mouth daily.   prochlorperazine 10 MG tablet Commonly known as:  COMPAZINE Take 1 tablet (10 mg total) by mouth every 6 (six) hours as needed (Nausea or vomiting).   ranitidine 150 MG tablet Commonly known as:  ZANTAC Take 150 mg by mouth daily as needed for heartburn.      Follow-up Information    [name], NP Follow up in 2 week(s).   Contact information: [place] [address] [phone]          No Known Allergies  Consultations:  None.   Procedures/Studies: Dg Chest 2 View  Result Date: 12/27/2018 CLINICAL DATA:  History of breast carcinoma. Fatigue and fever for 24 hours. EXAM: CHEST - 2 VIEW COMPARISON:  11/10/2018 and older exams. FINDINGS: The cardiac silhouette is normal in size and configuration. No mediastinal or hilar masses. No evidence of adenopathy. Right anterior chest wall Port-A-Cath extends to the right internal jugular vein, tip projecting in the right atrium, stable. Lungs are clear.  No pleural effusion or pneumothorax. Skeletal structures are intact. IMPRESSION: No active cardiopulmonary disease. Electronically Signed   By: Lajean Manes M.D.   On: 12/27/2018 22:25     Subjective: Patient was seen and examined on the day of discharge.  She was eating breakfast.  Still has occasional feeling of nausea but denies any vomiting or diarrhea.  No abdominal pain.  Remains  afebrile.   Discharge Exam: Vitals:   12/28/18 1950 12/29/18 0523  BP: 123/80 126/86  Pulse: 96 98  Resp: 18 20  Temp: 98.8 F (37.1 C) 98.8 F (37.1 C)  SpO2: 99% 97%   Vitals:   12/28/18 0551 12/28/18 1502 12/28/18 1950 12/29/18 0523  BP: 123/73 120/82 123/80 126/86  Pulse: (!) 103 96 96 98  Resp: 16 16 18 20   Temp: 98.2 F (36.8 C) 98.6 F (37 C) 98.8 F (37.1 C) 98.8 F (37.1 C)  TempSrc:  Oral Oral Oral  SpO2: 98% 100% 99% 97%  Weight:      Height:        General: Pt is alert, awake, not in acute distress Cardiovascular: RRR, S1/S2 +, no rubs, no gallops Respiratory: CTA bilaterally, no wheezing, no rhonchi Abdominal: Soft, NT, ND, bowel sounds + obese and pendulous. Extremities: no edema, no cyanosis Port-A-Cath on the right chest, nontender.    The results of significant diagnostics from this hospitalization (including imaging, microbiology, ancillary and laboratory) are listed below for reference.     Microbiology: Recent Results (from the past  240 hour(s))  Urine culture     Status: Abnormal   Collection Time: 12/27/18  9:22 PM  Result Value Ref Range Status   Specimen Description   Final    URINE, CLEAN CATCH Performed at [facility], [place] [address], [place], [place] [address]    Special Requests   Final    Normal Performed at [facility], [place] [address], [place], [place] [address]    Culture 30,000 COLONIES/mL ESCHERICHIA COLI (A)  Final   Report Status 12/29/2018 FINAL  Final   Organism ID, Bacteria ESCHERICHIA COLI (A)  Final      Susceptibility   Escherichia coli - MIC*    AMPICILLIN <=2 SENSITIVE Sensitive     CEFAZOLIN <=4 SENSITIVE Sensitive     CEFTRIAXONE <=1 SENSITIVE Sensitive     CIPROFLOXACIN <=0.25 SENSITIVE Sensitive     GENTAMICIN <=1 SENSITIVE Sensitive     IMIPENEM <=0.25 SENSITIVE Sensitive     NITROFURANTOIN <=16 SENSITIVE Sensitive     TRIMETH/SULFA <=20 SENSITIVE Sensitive      AMPICILLIN/SULBACTAM <=2 SENSITIVE Sensitive     PIP/TAZO <=4 SENSITIVE Sensitive     Extended ESBL NEGATIVE Sensitive     * 30,000 COLONIES/mL ESCHERICHIA COLI  Blood Culture (routine x 2)     Status: None (Preliminary result)   Collection Time: 12/27/18  9:32 PM  Result Value Ref Range Status   Specimen Description   Final    BLOOD LEFT ANTECUBITAL Performed at [facility] [address], [place], [place] [address]    Special Requests   Final    BOTTLES DRAWN AEROBIC AND ANAEROBIC Blood Culture results may not be optimal due to an excessive volume of blood received in culture bottles Performed at [facility] [address] [place], [place] [address]    Culture   Final    NO GROWTH < 12 HOURS Performed at [facility] [address], [place], [place] [address]    Report Status PENDING  Incomplete  Blood Culture (routine x 2)     Status: None (Preliminary result)   Collection Time: 12/27/18  9:36 PM  Result Value Ref Range Status   Specimen Description   Final    BLOOD RIGHT HAND Performed at [facility] [address], [place], [place] [address]    Special Requests   Final    BOTTLES DRAWN AEROBIC AND ANAEROBIC Blood Culture adequate volume Performed at [facility] [address], [place], [place] [address]    Culture   Final    NO GROWTH < 12 HOURS Performed at [facility] [address], [place], [place] [address]    Report Status PENDING  Incomplete     Labs: BNP (last 3 results) No results for input(s): BNP in the last 8760 hours. Basic Metabolic Panel: Recent Labs  Lab 12/23/18 1322 12/27/18 2136 12/28/18 0608  NA 141 139 142  K 3.3* 3.6 3.9  CL 105 105 111  CO2 24 27 25   GLUCOSE 138* 118* 106*  BUN 13 17 13   CREATININE 0.91 0.77 0.75  CALCIUM 9.1 9.0 8.7*   Liver Function Tests: Recent Labs  Lab 12/23/18 1322 12/27/18 2136 12/28/18 0608  AST 11* 15  13*  ALT 9 13 11   ALKPHOS 68 80 69  BILITOT 0.4 0.4 0.4  PROT 6.6 6.4* 5.6*  ALBUMIN 3.4* 3.7 3.1*   Recent Labs  Lab 12/27/18 2136  LIPASE 32   No results for input(s): AMMONIA in the last 168 hours. CBC: Recent Labs  Lab 12/23/18 1322 12/27/18 2136 12/28/18 0608  WBC 11.6* 39.1* 25.2*  NEUTROABS 8.7* 38.7* 20.3*  HGB 8.9* 9.4* 8.2*  HCT 27.9* 29.9* 26.8*  MCV 88.0 90.9 93.1  PLT 139* 148* 131*   Cardiac Enzymes: Recent Labs  Lab 12/27/18 2136  CKTOTAL 33*  TROPONINI <0.03   BNP: Invalid input(s): POCBNP CBG: No results for input(s): GLUCAP in the last 168 hours. D-Dimer No results for input(s): DDIMER in the last 72 hours. Hgb A1c No results for input(s): HGBA1C in the last 72 hours. Lipid Profile No results for input(s): CHOL, HDL, LDLCALC, TRIG, CHOLHDL, LDLDIRECT in the last 72 hours. Thyroid function studies No results for input(s): TSH, T4TOTAL, T3FREE, THYROIDAB in the last 72 hours.  Invalid input(s): FREET3 Anemia work up No results for input(s): VITAMINB12, FOLATE, FERRITIN, TIBC, IRON, RETICCTPCT in the last 72 hours. Urinalysis    Component Value Date/Time   COLORURINE YELLOW 12/27/2018 2122   APPEARANCEUR CLOUDY (A) 12/27/2018 2122   APPEARANCEUR Clear 07/07/2014 0204   LABSPEC 1.023 12/27/2018 2122   LABSPEC 1.011 07/07/2014 0204   PHURINE 5.0 12/27/2018 2122   GLUCOSEU NEGATIVE 12/27/2018 2122   GLUCOSEU Negative 07/07/2014 0204   HGBUR SMALL (A) 12/27/2018 2122   BILIRUBINUR NEGATIVE 12/27/2018 2122   BILIRUBINUR Negative 07/07/2014 0204   KETONESUR NEGATIVE 12/27/2018 2122   PROTEINUR NEGATIVE 12/27/2018 2122   UROBILINOGEN 0.2 08/17/2007 1555   NITRITE NEGATIVE 12/27/2018 2122   LEUKOCYTESUR TRACE (A) 12/27/2018 2122   LEUKOCYTESUR Negative 07/07/2014 0204   Sepsis Labs Invalid input(s): PROCALCITONIN,  WBC,  LACTICIDVEN Microbiology Recent Results (from the past 240 hour(s))  Urine culture     Status: Abnormal   Collection  Time: 12/27/18  9:22 PM  Result Value Ref Range Status   Specimen Description   Final    URINE, CLEAN CATCH Performed at [facility], [place] [address] [place], [place] [address]    Special Requests   Final    Normal Performed at [facility], [place] [address], [place], [place] [address]    Culture 30,000 COLONIES/mL ESCHERICHIA COLI (A)  Final   Report Status 12/29/2018 FINAL  Final   Organism ID, Bacteria ESCHERICHIA COLI (A)  Final      Susceptibility   Escherichia coli - MIC*    AMPICILLIN <=2 SENSITIVE Sensitive     CEFAZOLIN <=4 SENSITIVE Sensitive     CEFTRIAXONE <=1 SENSITIVE Sensitive     CIPROFLOXACIN <=0.25 SENSITIVE Sensitive     GENTAMICIN <=1 SENSITIVE Sensitive     IMIPENEM <=0.25 SENSITIVE Sensitive     NITROFURANTOIN <=16 SENSITIVE Sensitive     TRIMETH/SULFA <=20 SENSITIVE Sensitive     AMPICILLIN/SULBACTAM <=2 SENSITIVE Sensitive     PIP/TAZO <=4 SENSITIVE Sensitive     Extended ESBL NEGATIVE Sensitive     * 30,000 COLONIES/mL ESCHERICHIA COLI  Blood Culture (routine x 2)     Status: None (Preliminary result)   Collection Time: 12/27/18  9:32 PM  Result Value Ref Range Status   Specimen Description   Final    BLOOD LEFT ANTECUBITAL Performed at [facility] [address] [place],  [address]    Special Requests   Final    BOTTLES DRAWN AEROBIC AND ANAEROBIC Blood Culture results may not be optimal due to an excessive volume of blood received in culture bottles Performed at [facility]  [place] [address] [place], [place] [address]    Culture   Final    NO GROWTH < 12 HOURS Performed at [facility] [address], [place], [place] [address]    Report Status PENDING  Incomplete  Blood Culture (routine x 2)     Status: None (Preliminary result)   Collection Time: 12/27/18  9:36 PM  Result Value Ref Range Status   Specimen Description   Final    BLOOD RIGHT  HAND Performed at [facility] [address] [place] [address] [place], [place] [address]    Special Requests   Final    BOTTLES DRAWN AEROBIC AND ANAEROBIC Blood Culture adequate volume Performed at [facility] [address], [place], [place] [address]    Culture   Final    NO GROWTH < 12 HOURS Performed at [facility] [address], [place], [place] [address]    Report Status PENDING  Incomplete     Time coordinating discharge:  25 minutes  SIGNED:   Barb Merino, MD  [facility] 12/29/2018, 9:50 AM Pager   If 7PM-7AM, please contact night-coverage [email or web address] Password [email or web address]

## 2018-12-29 NOTE — TOC Initial Note (Signed)
Transition of Care [facility]) - Initial/Assessment Note    Patient Details  Name: [name] MRN: [id number] Date of Birth: 09-21-1961  [facility] CM/SW Contact:    Leeroy Cha, RN Phone Number: 12/29/2018, 10:25 AM  Clinical Narrative:                 Admitted due to continued vomiting and dehydration  Expected Discharge Plan: Home/Self Care Barriers to Discharge: No Barriers Identified   Patient Goals and CMS Choice Patient states their goals for this hospitalization and ongoing recovery are:: i want to go I am feeling much better      Expected Discharge Plan and Services Expected Discharge Plan: Home/Self Care Discharge Planning Services: CM Consult Post Acute Care Choice: NA Living arrangements for the past 2 months: Single Family Home Expected Discharge Date: 12/29/18                        Prior Living Arrangements/Services Living arrangements for the past 2 months: Single Family Home Lives with:: Spouse Patient language and need for interpreter reviewed:: No Do you feel safe going back to the place where you live?: Yes      Need for Family Participation in Patient Care: No (Comment) Care giver support system in place?: Yes (comment)   Criminal Activity/Legal Involvement Pertinent to Current Situation/Hospitalization: No - Comment as needed  Activities of Daily Living Home Assistive Devices/Equipment: Eyeglasses ADL Screening (condition at time of admission) Patient's cognitive ability adequate to safely complete daily activities?: Yes Is the patient deaf or have difficulty hearing?: No Does the patient have difficulty seeing, even when wearing glasses/contacts?: No Does the patient have difficulty concentrating, remembering, or making decisions?: No Patient able to express need for assistance with ADLs?: Yes Does the patient have difficulty dressing or bathing?: No Independently performs ADLs?: Yes (appropriate for  developmental age) Does the patient have difficulty walking or climbing stairs?: Yes(secondary to weakness) Weakness of Legs: Both Weakness of Arms/Hands: Both  Permission Sought/Granted                  Emotional Assessment Appearance:: Appears stated age   Affect (typically observed): Accepting, Calm Orientation: : Oriented to Self, Oriented to Place, Oriented to  Time, Oriented to Situation Alcohol / Substance Use: Never Used Psych Involvement: No (comment)  Admission diagnosis:  Dehydration [E86.0] Body aches [R52] Leukocytosis, unspecified type [D72.829] Non-intractable vomiting with nausea, unspecified vomiting type [R11.2] Patient Active Problem List   Diagnosis Date Noted  . Leukocytosis 12/28/2018  . Dehydration 12/28/2018  . Nausea vomiting and diarrhea 12/28/2018  . Chemotherapy induced nausea and vomiting 12/28/2018  . Non-intractable vomiting 12/27/2018  . Port-A-Cath in place 11/18/2018  . Malignant neoplasm of upper-outer quadrant of left breast in female, estrogen receptor positive ([place]) 10/26/2018  . Multiple neurological symptoms 03/31/2017  . Pain in back 02/21/2014  . Female stress incontinence 02/21/2014  . Carbuncle 02/21/2014  . Symptomatic menopausal or female climacteric states 02/21/2014  . CONGESTIVE HEART FAILURE 03/22/2009  . CHEST PAIN-PRECORDIAL 03/22/2009  . Essential hypertension 07/31/2008  . ABDOMINAL PAIN, UNSPECIFIED SITE 07/31/2008   PCP:  [name], NP Pharmacy:   [id number] [place] [place] [address] [place] [address] Phone: [phone] Fax: [phone]     Social Determinants of Health (SDOH) Interventions    Readmission Risk Interventions 30 Day Unplanned Readmission Risk Score  ED to Hosp-Admission (Current) from 12/27/2018 in [address] [facility]  30 Day Unplanned Readmission Risk Score  (%)  15 Filed at 12/29/2018 0801     This score is the patient's risk of an unplanned readmission within 30 days of being discharged (0 -100%). The score is based on dignosis, age, lab data, medications, orders, and past utilization.   Low:  0-14.9   Medium: 15-21.9   High: 22-29.9   Extreme: 30 and above       No flowsheet data found.

## 2018-12-30 ENCOUNTER — Telehealth: Payer: Self-pay

## 2018-12-30 NOTE — Telephone Encounter (Signed)
Pt called to inquire about what to do if she gets exposed to covid virus and what are her risk of getting it. Told pt that she will need to notify our office prior to coming to her infusion appts to make sure she is screened ahead of time for covid symptoms and will need to direct her to the [facility] website hotline for further instructions on possibly getting tested in one of the drive thru sites, locally. Pt was concerned and wanted to know what will happen to her. Told pt to not panic, and monitor her temperature carefully, since she was recently discharge from hospital for dehydration and diarrhea. Pt to stay away from people and public. Diligent hand hygiene is key to make sure the she remains healthy. Pt verbalized understanding .

## 2019-01-01 LAB — CULTURE, BLOOD (ROUTINE X 2)
Culture: NO GROWTH
Culture: NO GROWTH
Special Requests: ADEQUATE

## 2019-01-05 NOTE — Assessment & Plan Note (Signed)
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  11/01/2018: MRI biopsy left breast IDC grade 1, ER PR positive HER-2 negative  Treatment plan: 1.Neoadjuvant chemotherapy with dose dense Adriamycin and Cytoxan every 2 weeks x4 followed by Taxol weekly x12 2.Followed by mastectomy 3.Followed by radiation 4.Followed by antiestrogen therapy. ----------------------------------------------------------------------------------------------------------------------------------------------------- Current treatment: Completed 4 cycles ofdose dense Adriamycin and Cytoxan, today is cycle 1 Taxol Echocardiogram 11/05/2018: EF 55 to 60%  Chemo toxicities: 1.Chest pressure 2.Nausea and vomiting and diarrhea required hospitalization 12/27/2018- 12/29/2018 3.Severe fatigue 4.Alopecia 5.  Chemotherapy-induced anemia: Hemoglobin 8.9.  They are watching it very closely.  If it drops below 8 she will need blood transfusion.  Return to clinic in1weekfor cycle 2 Taxol

## 2019-01-05 NOTE — Progress Notes (Signed)
Patient Care Team: [name], NP as PCP - General Erroll Luna, MD as Consulting Physician (General Surgery) Nicholas Lose, MD as Consulting Physician (Hematology and Oncology) Kyung Rudd, MD as Consulting Physician (Radiation Oncology)  DIAGNOSIS:    ICD-10-CM   1. Malignant neoplasm of upper-outer quadrant of left breast in female, estrogen receptor positive ([place]) C50.412    Z17.0     SUMMARY OF ONCOLOGIC HISTORY:   Malignant neoplasm of upper-outer quadrant of left breast in female, estrogen receptor positive ([place])   10/22/2018 Initial Diagnosis    Screening detected left breast architectural distortion: 4.5 cm, UOQ, by ultrasound measured 5 cm at 1 o'clock position multiple enlarged lymph nodes, biopsy revealed grade 1 IDC with DCIS with lymphovascular invasion, lymph node biopsy positive, intramammary lymph node biopsy negative, ER 100%, PR 100%, Ki-67 20%, HER-2 1+ by IHC, T2 and 1 stage 2A    10/27/2018 Cancer Staging    Staging form: Breast, AJCC 8th Edition - Clinical: Stage IIA (cT2, cN1(f), cM0, G1, ER+, PR+, HER2-) - Signed by Nicholas Lose, MD on 11/01/2018    11/11/2018 -  Neo-Adjuvant Chemotherapy    Neoadjuvant chemotherapy with dose dense Adriamycin and Cytoxan every 2 weeks x4 followed by Taxol weekly x12     CHIEF COMPLIANT: Cycle 1 Taxol  INTERVAL HISTORY: [name] is a 58 y.o. with above-mentioned history of left breast cancer who completed 4 cycles of neoadjuvant chemotherapy with dose dense Adriamycin and Cytoxan and presents to the clinic alone today for cycle 1 of weekly Taxol treatments. On 12/27/18 she presented to the ED for nausea, diarrhea, and dehydration and was discharged the same day.   REVIEW OF SYSTEMS:   Constitutional: Denies fevers, chills or abnormal weight loss Eyes: Denies blurriness of vision Ears, nose, mouth, throat, and face: Denies mucositis or sore throat Respiratory: Denies cough, dyspnea or wheezes  Cardiovascular: Denies palpitation, chest discomfort Gastrointestinal: Denies nausea, heartburn or change in bowel habits Skin: Denies abnormal skin rashes Lymphatics: Denies new lymphadenopathy or easy bruising Neurological: Denies numbness, tingling or new weaknesses Behavioral/Psych: Mood is stable, no new changes  Extremities: No lower extremity edema Breast: denies any pain or lumps or nodules in either breasts All other systems were reviewed with the patient and are negative.  I have reviewed the past medical history, past surgical history, social history and family history with the patient and they are unchanged from previous note.  ALLERGIES:  has No Known Allergies.  MEDICATIONS:  Current Outpatient Medications  Medication Sig Dispense Refill  . acetaminophen (TYLENOL) 500 MG tablet Take 1,000 mg by mouth every 6 (six) hours as needed (pain after Neulasta injection.).    Marland Kitchen carvedilol (COREG) 6.25 MG tablet TAKE 1 TABLET BY MOUTH TWICE DAILY (Patient taking differently: Take 6.25 mg by mouth 2 (two) times daily with a meal. ) 180 tablet 1  . dexamethasone (DECADRON) 4 MG tablet Take 1 tablet day after chemo and 1 tablet 2 days after chemo with food 8 tablet 0  . lidocaine-prilocaine (EMLA) cream Apply to affected area once 30 g 3  . lisinopril-hydrochlorothiazide (PRINZIDE,ZESTORETIC) 20-12.5 MG per tablet TAKE 2 TABLETS BY MOUTH DAILY (Patient taking differently: Take 1 tablet by mouth daily. ) 180 tablet 1  . loratadine (CLARITIN) 10 MG tablet Take 10 mg by mouth as needed (pain after Neulasta injection.).    Marland Kitchen ondansetron (ZOFRAN) 8 MG tablet Take 1 tablet (8 mg total) by mouth 2 (two) times daily as needed. Start on  the third day after chemotherapy. 30 tablet 1  . oxyCODONE (OXY IR/ROXICODONE) 5 MG immediate release tablet Take 1 tablet (5 mg total) by mouth every 6 (six) hours as needed for severe pain. 12 tablet 0  . pantoprazole (PROTONIX) 40 MG tablet Take 1 tablet (40 mg  total) by mouth daily. 30 tablet 0  . prochlorperazine (COMPAZINE) 10 MG tablet Take 1 tablet (10 mg total) by mouth every 6 (six) hours as needed (Nausea or vomiting). 30 tablet 1  . ranitidine (ZANTAC) 150 MG tablet Take 150 mg by mouth daily as needed for heartburn.     No current facility-administered medications for this visit.    Facility-Administered Medications Ordered in Other Visits  Medication Dose Route Frequency Provider Last Rate Last Dose  . gadopentetate dimeglumine (MAGNEVIST) injection 20 mL  20 mL Intravenous Once PRN Melvenia Beam, MD      . gadopentetate dimeglumine (MAGNEVIST) injection 20 mL  20 mL Intravenous Once PRN Melvenia Beam, MD        PHYSICAL EXAMINATION: ECOG PERFORMANCE STATUS: 1 - Symptomatic but completely ambulatory  Vitals:   01/07/19 0832  BP: 106/78  Pulse: 98  Resp: 18  Temp: 98.4 F (36.9 C)  SpO2: 100%   Filed Weights   01/07/19 0832  Weight: 279 lb 1.6 oz (126.6 kg)    GENERAL: alert, no distress and comfortable SKIN: skin color, texture, turgor are normal, no rashes or significant lesions EYES: normal, Conjunctiva are pink and non-injected, sclera clear OROPHARYNX: no exudate, no erythema and lips, buccal mucosa, and tongue normal  NECK: supple, thyroid normal size, non-tender, without nodularity LYMPH: no palpable lymphadenopathy in the cervical, axillary or inguinal LUNGS: clear to auscultation and percussion with normal breathing effort HEART: regular rate & rhythm and no murmurs and no lower extremity edema ABDOMEN: abdomen soft, non-tender and normal bowel sounds MUSCULOSKELETAL: no cyanosis of digits and no clubbing  NEURO: alert & oriented x 3 with fluent speech, no focal motor/sensory deficits EXTREMITIES: No lower extremity edema  LABORATORY DATA:  I have reviewed the data as listed CMP Latest Ref Rng & Units 01/07/2019 12/28/2018 12/27/2018  Glucose 70 - 99 mg/dL 136(H) 106(H) 118(H)  BUN 6 - 20 mg/dL _0 Creatinine 0.44 - 1.00 mg/dL 0.86 0.75 0.77  Sodium 135 - 145 mmol/L 142 142 139  Potassium 3.5 - 5.1 mmol/L 3.4(L) 3.9 3.6  Chloride 98 - 111 mmol/L 107 111 105  CO2 22 - 32 mmol/L _1 Calcium 8.9 - 10.3 mg/dL 8.8(L) 8.7(L) 9.0  Total Protein 6.5 - 8.1 g/dL 6.6 5.6(L) 6.4(L)  Total Bilirubin 0.3 - 1.2 mg/dL 0.4 0.4 0.4  Alkaline Phos 38 - 126 U/L 68 69 80  AST 15 - 41 U/L 11(L) 13(L) 15  ALT 0 - 44 U/L _2 Lab Results  Component Value Date   WBC 6.3 01/07/2019   HGB 8.7 (L) 01/07/2019   HCT 27.6 (L) 01/07/2019   MCV 91.1 01/07/2019   PLT 135 (L) 01/07/2019   NEUTROABS 4.6 01/07/2019    ASSESSMENT & PLAN:  Malignant neoplasm of upper-outer quadrant of left breast in female, estrogen receptor positive ([place]) 10/22/2018:Screening detected left breast architectural distortion: 4.5 cm, UOQ, by ultrasound measured 5 cm at 1 o'clock position multiple enlarged lymph nodes, biopsy revealed grade 1 IDC with DCIS with lymphovascular invasion, lymph node biopsy positive, intramammary lymph node biopsy negative, ER 100%, PR 100%, Ki-67 20%,  HER-2 1+ by IHC, T2 and 1 stage Ib  10/28/2018: Breast MRI: Non-mass enhancement with architectural distortion UOQ left breast 5 x 3 x 10 cm,4enlarged level 1 axillary lymph nodes.  11/01/2018: MRI biopsy left breast IDC grade 1, ER PR positive HER-2 negative  Treatment plan: 1.Neoadjuvant chemotherapy with dose dense Adriamycin and Cytoxan every 2 weeks x4 followed by Taxol weekly x12 2.Followed by mastectomy 3.Followed by radiation 4.Followed by antiestrogen therapy. ----------------------------------------------------------------------------------------------------------------------------------------------------- Current treatment: Completed 4 cycles ofdose dense Adriamycin and Cytoxan, today is cycle 1 Taxol Echocardiogram 11/05/2018: EF 55 to 60%  Chemo toxicities: 1.Chest pressure 2.Nausea and vomiting and diarrhea  required hospitalization 12/27/2018- 12/29/2018 3.Severe fatigue 4.Alopecia 5.  Chemotherapy-induced anemia: Hemoglobin 8.7.  weare watching it very closely.  If it drops below 8 she will need blood transfusion. 6. Hospitalization for Nausea, vomiting and diarrhea 7. Vaginal Irritation and boils: instructed to use bacitracin ointment  Return to clinic in1weekfor cycle 2 Taxol    No orders of the defined types were placed in this encounter.  The patient has a good understanding of the overall plan. she agrees with it. she will call with any problems that may develop before the next visit here.  I communicated with the patient via a Webex video conference and verified that I am speaking with the correct person using two identifiers. I discussed the limitations, risks, security and privacy concerns of performing an evaluation and management service by Webex. I also discussed with the patient that there may be a patient responsible charge related to this service. The patient expressed understanding and agreed to proceed.   Nicholas Lose, MD 01/07/2019  Julious Oka Dorshimer am acting as scribe for Dr. Nicholas Lose.  I have reviewed the above documentation for accuracy and completeness, and I agree with the above.

## 2019-01-07 ENCOUNTER — Inpatient Hospital Stay: Payer: BC Managed Care – PPO

## 2019-01-07 ENCOUNTER — Other Ambulatory Visit: Payer: Self-pay

## 2019-01-07 ENCOUNTER — Inpatient Hospital Stay (HOSPITAL_BASED_OUTPATIENT_CLINIC_OR_DEPARTMENT_OTHER): Payer: BC Managed Care – PPO | Admitting: Hematology and Oncology

## 2019-01-07 VITALS — BP 118/82 | HR 82 | Temp 98.0°F | Resp 18

## 2019-01-07 DIAGNOSIS — Z5189 Encounter for other specified aftercare: Secondary | ICD-10-CM | POA: Diagnosis not present

## 2019-01-07 DIAGNOSIS — Z170 Estrogen receptor positive status [ER+]: Principal | ICD-10-CM

## 2019-01-07 DIAGNOSIS — C50412 Malignant neoplasm of upper-outer quadrant of left female breast: Secondary | ICD-10-CM | POA: Diagnosis not present

## 2019-01-07 DIAGNOSIS — D6481 Anemia due to antineoplastic chemotherapy: Secondary | ICD-10-CM | POA: Diagnosis not present

## 2019-01-07 DIAGNOSIS — Z95828 Presence of other vascular implants and grafts: Secondary | ICD-10-CM

## 2019-01-07 DIAGNOSIS — Z5111 Encounter for antineoplastic chemotherapy: Secondary | ICD-10-CM | POA: Diagnosis not present

## 2019-01-07 DIAGNOSIS — C773 Secondary and unspecified malignant neoplasm of axilla and upper limb lymph nodes: Secondary | ICD-10-CM | POA: Diagnosis not present

## 2019-01-07 LAB — CBC WITH DIFFERENTIAL (CANCER CENTER ONLY)
Abs Immature Granulocytes: 0.36 10*3/uL — ABNORMAL HIGH (ref 0.00–0.07)
Basophils Absolute: 0 10*3/uL (ref 0.0–0.1)
Basophils Relative: 1 %
Eosinophils Absolute: 0 10*3/uL (ref 0.0–0.5)
Eosinophils Relative: 1 %
HCT: 27.6 % — ABNORMAL LOW (ref 36.0–46.0)
Hemoglobin: 8.7 g/dL — ABNORMAL LOW (ref 12.0–15.0)
Immature Granulocytes: 6 %
Lymphocytes Relative: 7 %
Lymphs Abs: 0.5 10*3/uL — ABNORMAL LOW (ref 0.7–4.0)
MCH: 28.7 pg (ref 26.0–34.0)
MCHC: 31.5 g/dL (ref 30.0–36.0)
MCV: 91.1 fL (ref 80.0–100.0)
MONOS PCT: 12 %
Monocytes Absolute: 0.8 10*3/uL (ref 0.1–1.0)
Neutro Abs: 4.6 10*3/uL (ref 1.7–7.7)
Neutrophils Relative %: 73 %
Platelet Count: 135 10*3/uL — ABNORMAL LOW (ref 150–400)
RBC: 3.03 MIL/uL — ABNORMAL LOW (ref 3.87–5.11)
RDW: 16.7 % — ABNORMAL HIGH (ref 11.5–15.5)
WBC Count: 6.3 10*3/uL (ref 4.0–10.5)
nRBC: 0.5 % — ABNORMAL HIGH (ref 0.0–0.2)

## 2019-01-07 LAB — CMP (CANCER CENTER ONLY)
ALT: 10 U/L (ref 0–44)
ANION GAP: 12 (ref 5–15)
AST: 11 U/L — ABNORMAL LOW (ref 15–41)
Albumin: 3.5 g/dL (ref 3.5–5.0)
Alkaline Phosphatase: 68 U/L (ref 38–126)
BUN: 11 mg/dL (ref 6–20)
CALCIUM: 8.8 mg/dL — AB (ref 8.9–10.3)
CO2: 23 mmol/L (ref 22–32)
CREATININE: 0.86 mg/dL (ref 0.44–1.00)
Chloride: 107 mmol/L (ref 98–111)
GFR, Est AFR Am: >60 mL/min (ref >60)
GFR, Estimated: >60 mL/min (ref >60)
Glucose, Bld: 136 mg/dL — ABNORMAL HIGH (ref 70–99)
Potassium: 3.4 mmol/L — ABNORMAL LOW (ref 3.5–5.1)
Sodium: 142 mmol/L (ref 135–145)
Total Bilirubin: 0.4 mg/dL (ref 0.3–1.2)
Total Protein: 6.6 g/dL (ref 6.5–8.1)

## 2019-01-07 MED ORDER — SODIUM CHLORIDE 0.9 % IV SOLN
20.0000 mg | Freq: Once | INTRAVENOUS | Status: AC
  Administered 2019-01-07: 20 mg via INTRAVENOUS
  Filled 2019-01-07: qty 2

## 2019-01-07 MED ORDER — DIPHENHYDRAMINE HCL 50 MG/ML IJ SOLN
50.0000 mg | Freq: Once | INTRAMUSCULAR | Status: AC
  Administered 2019-01-07: 50 mg via INTRAVENOUS

## 2019-01-07 MED ORDER — FAMOTIDINE IN NACL 20-0.9 MG/50ML-% IV SOLN: 20.0000 mg | Freq: Once | INTRAVENOUS | Status: DC

## 2019-01-07 MED ORDER — SODIUM CHLORIDE 0.9% FLUSH
10.0000 mL | INTRAVENOUS | Status: DC | PRN
  Administered 2019-01-07: 10 mL
  Filled 2019-01-07: qty 10

## 2019-01-07 MED ORDER — HEPARIN SOD (PORK) LOCK FLUSH 100 UNIT/ML IV SOLN
500.0000 [IU] | Freq: Once | INTRAVENOUS | Status: AC | PRN
  Administered 2019-01-07: 500 [IU]
  Filled 2019-01-07: qty 5

## 2019-01-07 MED ORDER — SODIUM CHLORIDE 0.9 % IV SOLN
20.0000 mg | Freq: Once | INTRAVENOUS | Status: AC
  Administered 2019-01-07: 20 mg via INTRAVENOUS
  Filled 2019-01-07: qty 20

## 2019-01-07 MED ORDER — SODIUM CHLORIDE 0.9 % IV SOLN
80.0000 mg/m2 | Freq: Once | INTRAVENOUS | Status: AC
  Administered 2019-01-07: 210 mg via INTRAVENOUS
  Filled 2019-01-07: qty 35

## 2019-01-07 MED ORDER — SODIUM CHLORIDE 0.9 % IV SOLN
Freq: Once | INTRAVENOUS | Status: AC
  Administered 2019-01-07: 09:00:00 via INTRAVENOUS
  Filled 2019-01-07: qty 250

## 2019-01-07 MED ORDER — DIPHENHYDRAMINE HCL 50 MG/ML IJ SOLN
INTRAMUSCULAR | Status: AC
  Filled 2019-01-07: qty 1

## 2019-01-07 NOTE — Patient Instructions (Addendum)
[facility] Discharge Instructions for Patients Receiving Chemotherapy  Today you received the following chemotherapy agents: Taxol To help prevent nausea and vomiting after your treatment, we encourage you to take your nausea medication as directed.   If you develop nausea and vomiting that is not controlled by your nausea medication, call the clinic.   BELOW ARE SYMPTOMS THAT SHOULD BE REPORTED IMMEDIATELY:  *FEVER GREATER THAN 100.5 F  *CHILLS WITH OR WITHOUT FEVER  NAUSEA AND VOMITING THAT IS NOT CONTROLLED WITH YOUR NAUSEA MEDICATION  *UNUSUAL SHORTNESS OF BREATH  *UNUSUAL BRUISING OR BLEEDING  TENDERNESS IN MOUTH AND THROAT WITH OR WITHOUT PRESENCE OF ULCERS  *URINARY PROBLEMS  *BOWEL PROBLEMS  UNUSUAL RASH Items with * indicate a potential emergency and should be followed up as soon as possible.  Feel free to call the clinic should you have any questions or concerns. The clinic phone number is [phone].  Please show the CHEMO ALERT CARD at check-in to the Emergency Department and triage nurse.   Paclitaxel injection(Taxol) What is this medicine? PACLITAXEL (PAK li TAX el) is a chemotherapy drug. It targets fast dividing cells, like cancer cells, and causes these cells to die. This medicine is used to treat ovarian cancer, breast cancer, lung cancer, Kaposi's sarcoma, and other cancers. This medicine may be used for other purposes; ask your health care provider or pharmacist if you have questions. COMMON BRAND NAME(S): Onxol, Taxol What should I tell my health care provider before I take this medicine? They need to know if you have any of these conditions: -history of irregular heartbeat -liver disease -low blood counts, like low white cell, platelet, or red cell counts -lung or breathing disease, like asthma -tingling of the fingers or toes, or other nerve disorder -an unusual or allergic reaction to paclitaxel, alcohol, polyoxyethylated castor  oil, other chemotherapy, other medicines, foods, dyes, or preservatives -pregnant or trying to get pregnant -breast-feeding How should I use this medicine? This drug is given as an infusion into a vein. It is administered in a hospital or clinic by a specially trained health care professional. Talk to your pediatrician regarding the use of this medicine in children. Special care may be needed. Overdosage: If you think you have taken too much of this medicine contact a poison control center or emergency room at once. NOTE: This medicine is only for you. Do not share this medicine with others. What if I miss a dose? It is important not to miss your dose. Call your doctor or health care professional if you are unable to keep an appointment. What may interact with this medicine? Do not take this medicine with any of the following medications: -disulfiram -metronidazole This medicine may also interact with the following medications: -antiviral medicines for hepatitis, HIV or AIDS -certain antibiotics like erythromycin and clarithromycin -certain medicines for fungal infections like ketoconazole and itraconazole -certain medicines for seizures like carbamazepine, phenobarbital, phenytoin -gemfibrozil -nefazodone -rifampin -St. John's wort This list may not describe all possible interactions. Give your health care provider a list of all the medicines, herbs, non-prescription drugs, or dietary supplements you use. Also tell them if you smoke, drink alcohol, or use illegal drugs. Some items may interact with your medicine. What should I watch for while using this medicine? Your condition will be monitored carefully while you are receiving this medicine. You will need important blood work done while you are taking this medicine. This medicine can cause serious allergic reactions. To reduce your risk   you will need to take other medicine(s) before treatment with this medicine. If you experience  allergic reactions like skin rash, itching or hives, swelling of the face, lips, or tongue, tell your doctor or health care professional right away. In some cases, you may be given additional medicines to help with side effects. Follow all directions for their use. This drug may make you feel generally unwell. This is not uncommon, as chemotherapy can affect healthy cells as well as cancer cells. Report any side effects. Continue your course of treatment even though you feel ill unless your doctor tells you to stop. Call your doctor or health care professional for advice if you get a fever, chills or sore throat, or other symptoms of a cold or flu. Do not treat yourself. This drug decreases your body's ability to fight infections. Try to avoid being around people who are sick. This medicine may increase your risk to bruise or bleed. Call your doctor or health care professional if you notice any unusual bleeding. Be careful brushing and flossing your teeth or using a toothpick because you may get an infection or bleed more easily. If you have any dental work done, tell your dentist you are receiving this medicine. Avoid taking products that contain aspirin, acetaminophen, ibuprofen, naproxen, or ketoprofen unless instructed by your doctor. These medicines may hide a fever. Do not become pregnant while taking this medicine. Women should inform their doctor if they wish to become pregnant or think they might be pregnant. There is a potential for serious side effects to an unborn child. Talk to your health care professional or pharmacist for more information. Do not breast-feed an infant while taking this medicine. Men are advised not to father a child while receiving this medicine. This product may contain alcohol. Ask your pharmacist or healthcare provider if this medicine contains alcohol. Be sure to tell all healthcare providers you are taking this medicine. Certain medicines, like metronidazole and  disulfiram, can cause an unpleasant reaction when taken with alcohol. The reaction includes flushing, headache, nausea, vomiting, sweating, and increased thirst. The reaction can last from 30 minutes to several hours. What side effects may I notice from receiving this medicine? Side effects that you should report to your doctor or health care professional as soon as possible: -allergic reactions like skin rash, itching or hives, swelling of the face, lips, or tongue -breathing problems -changes in vision -fast, irregular heartbeat -high or low blood pressure -mouth sores -pain, tingling, numbness in the hands or feet -signs of decreased platelets or bleeding - bruising, pinpoint red spots on the skin, black, tarry stools, blood in the urine -signs of decreased red blood cells - unusually weak or tired, feeling faint or lightheaded, falls -signs of infection - fever or chills, cough, sore throat, pain or difficulty passing urine -signs and symptoms of liver injury like dark yellow or brown urine; general ill feeling or flu-like symptoms; light-colored stools; loss of appetite; nausea; right upper belly pain; unusually weak or tired; yellowing of the eyes or skin -swelling of the ankles, feet, hands -unusually slow heartbeat Side effects that usually do not require medical attention (report to your doctor or health care professional if they continue or are bothersome): -diarrhea -hair loss -loss of appetite -muscle or joint pain -nausea, vomiting -pain, redness, or irritation at site where injected -tiredness This list may not describe all possible side effects. Call your doctor for medical advice about side effects. You may report side effects to FDA  at 1-800-FDA-1088. Where should I keep my medicine? This drug is given in a hospital or clinic and will not be stored at home. NOTE: This sheet is a summary. It may not cover all possible information. If you have questions about this medicine,  talk to your doctor, pharmacist, or health care provider.  2019 [employer]/Gold Standard (2017-06-02 13:14:55)  

## 2019-01-12 NOTE — Progress Notes (Signed)
Patient Care Team: [name], NP as PCP - General Erroll Luna, MD as Consulting Physician (General Surgery) Nicholas Lose, MD as Consulting Physician (Hematology and Oncology) Kyung Rudd, MD as Consulting Physician (Radiation Oncology)  DIAGNOSIS:    ICD-10-CM   1. Malignant neoplasm of upper-outer quadrant of left breast in female, estrogen receptor positive ([place]) C50.412    Z17.0     SUMMARY OF ONCOLOGIC HISTORY:   Malignant neoplasm of upper-outer quadrant of left breast in female, estrogen receptor positive ([place])   10/22/2018 Initial Diagnosis    Screening detected left breast architectural distortion: 4.5 cm, UOQ, by ultrasound measured 5 cm at 1 o'clock position multiple enlarged lymph nodes, biopsy revealed grade 1 IDC with DCIS with lymphovascular invasion, lymph node biopsy positive, intramammary lymph node biopsy negative, ER 100%, PR 100%, Ki-67 20%, HER-2 1+ by IHC, T2 and 1 stage 2A    10/27/2018 Cancer Staging    Staging form: Breast, AJCC 8th Edition - Clinical: Stage IIA (cT2, cN1(f), cM0, G1, ER+, PR+, HER2-) - Signed by Nicholas Lose, MD on 11/01/2018    11/11/2018 -  Neo-Adjuvant Chemotherapy    Neoadjuvant chemotherapy with dose dense Adriamycin and Cytoxan every 2 weeks x4 followed by Taxol weekly x12     CHIEF COMPLIANT: Cycle 2 Taxol  INTERVAL HISTORY: [name] is a 58 y.o. with above-mentioned history of left breast cancer who completed 4 cycles of neoadjuvant chemotherapy with dose dense Adriamycin and Cytoxan and presents to the clinic alone today for cycle 2 of weekly Taxol treatments.  She is tolerating Taxol extremely well.  Denies any nausea vomiting.  She does have fatigue.  She had a numbness sensation in her feet which went away.  REVIEW OF SYSTEMS:   Constitutional: Denies fevers, chills or abnormal weight loss Eyes: Denies blurriness of vision Ears, nose, mouth, throat, and face: Denies mucositis or sore throat  Respiratory: Shortness of breath with exertion from anemia Cardiovascular: Denies palpitation, chest discomfort Gastrointestinal: Denies nausea, heartburn or change in bowel habits Skin: Denies abnormal skin rashes Lymphatics: Denies new lymphadenopathy or easy bruising Neurological: Denies numbness, tingling or new weaknesses Behavioral/Psych: Mood is stable, no new changes  Extremities: No lower extremity edema Breast: denies any pain or lumps or nodules in either breasts All other systems were reviewed with the patient and are negative.  I have reviewed the past medical history, past surgical history, social history and family history with the patient and they are unchanged from previous note.  ALLERGIES:  has No Known Allergies.  MEDICATIONS:  Current Outpatient Medications  Medication Sig Dispense Refill  . acetaminophen (TYLENOL) 500 MG tablet Take 1,000 mg by mouth every 6 (six) hours as needed (pain after Neulasta injection.).    Marland Kitchen carvedilol (COREG) 6.25 MG tablet TAKE 1 TABLET BY MOUTH TWICE DAILY (Patient taking differently: Take 6.25 mg by mouth 2 (two) times daily with a meal. ) 180 tablet 1  . lidocaine-prilocaine (EMLA) cream Apply to affected area once 30 g 3  . lisinopril-hydrochlorothiazide (PRINZIDE,ZESTORETIC) 20-12.5 MG per tablet TAKE 2 TABLETS BY MOUTH DAILY (Patient taking differently: Take 1 tablet by mouth daily. ) 180 tablet 1  . ondansetron (ZOFRAN) 8 MG tablet Take 1 tablet (8 mg total) by mouth 2 (two) times daily as needed. Start on the third day after chemotherapy. 30 tablet 1  . oxyCODONE (OXY IR/ROXICODONE) 5 MG immediate release tablet Take 1 tablet (5 mg total) by mouth every 6 (six) hours as needed for  severe pain. 12 tablet 0  . pantoprazole (PROTONIX) 40 MG tablet TAKE 1 TABLET BY MOUTH EVERY DAY 30 tablet 0  . prochlorperazine (COMPAZINE) 10 MG tablet Take 1 tablet (10 mg total) by mouth every 6 (six) hours as needed (Nausea or vomiting). 30 tablet 1   . ranitidine (ZANTAC) 150 MG tablet Take 150 mg by mouth daily as needed for heartburn.     No current facility-administered medications for this visit.    Facility-Administered Medications Ordered in Other Visits  Medication Dose Route Frequency Provider Last Rate Last Dose  . gadopentetate dimeglumine (MAGNEVIST) injection 20 mL  20 mL Intravenous Once PRN Melvenia Beam, MD      . gadopentetate dimeglumine (MAGNEVIST) injection 20 mL  20 mL Intravenous Once PRN Melvenia Beam, MD        PHYSICAL EXAMINATION: ECOG PERFORMANCE STATUS: 1 - Symptomatic but completely ambulatory  Vitals:   01/13/19 1021  BP: 107/79  Pulse: (!) 107  Resp: 17  Temp: 98.3 F (36.8 C)  SpO2: 100%   Filed Weights   01/13/19 1021  Weight: 275 lb 12.8 oz (125.1 kg)    GENERAL: alert, no distress and comfortable SKIN: skin color, texture, turgor are normal, no rashes or significant lesions EYES: normal, Conjunctiva are pink and non-injected, sclera clear OROPHARYNX: no exudate, no erythema and lips, buccal mucosa, and tongue normal  NECK: supple, thyroid normal size, non-tender, without nodularity LYMPH: no palpable lymphadenopathy in the cervical, axillary or inguinal LUNGS: clear to auscultation and percussion with normal breathing effort HEART: regular rate & rhythm and no murmurs and no lower extremity edema ABDOMEN: abdomen soft, non-tender and normal bowel sounds MUSCULOSKELETAL: no cyanosis of digits and no clubbing  NEURO: alert & oriented x 3 with fluent speech, no focal motor/sensory deficits EXTREMITIES: No lower extremity edema  LABORATORY DATA:  I have reviewed the data as listed CMP Latest Ref Rng & Units 01/07/2019 12/28/2018 12/27/2018  Glucose 70 - 99 mg/dL 136(H) 106(H) 118(H)  BUN 6 - 20 mg/dL '11 13 17  '$ Creatinine 0.44 - 1.00 mg/dL 0.86 0.75 0.77  Sodium 135 - 145 mmol/L 142 142 139  Potassium 3.5 - 5.1 mmol/L 3.4(L) 3.9 3.6  Chloride 98 - 111 mmol/L 107 111 105  CO2 22 -  32 mmol/L '23 25 27  '$ Calcium 8.9 - 10.3 mg/dL 8.8(L) 8.7(L) 9.0  Total Protein 6.5 - 8.1 g/dL 6.6 5.6(L) 6.4(L)  Total Bilirubin 0.3 - 1.2 mg/dL 0.4 0.4 0.4  Alkaline Phos 38 - 126 U/L 68 69 80  AST 15 - 41 U/L 11(L) 13(L) 15  ALT 0 - 44 U/L '10 11 13    '$ Lab Results  Component Value Date   WBC 4.1 01/13/2019   HGB 8.5 (L) 01/13/2019   HCT 26.0 (L) 01/13/2019   MCV 88.7 01/13/2019   PLT 249 01/13/2019   NEUTROABS 3.1 01/13/2019    ASSESSMENT & PLAN:  Malignant neoplasm of upper-outer quadrant of left breast in female, estrogen receptor positive ([place]) 10/22/2018:Screening detected left breast architectural distortion: 4.5 cm, UOQ, by ultrasound measured 5 cm at 1 o'clock position multiple enlarged lymph nodes, biopsy revealed grade 1 IDC with DCIS with lymphovascular invasion, lymph node biopsy positive, intramammary lymph node biopsy negative, ER 100%, PR 100%, Ki-67 20%, HER-2 1+ by IHC, T2 and 1 stage Ib  10/28/2018: Breast MRI: Non-mass enhancement with architectural distortion UOQ left breast 5 x 3 x 10 cm,4enlarged level 1 axillary lymph nodes.  11/01/2018: MRI  biopsy left breast IDC grade 1, ER PR positive HER-2 negative  Treatment plan: 1.Neoadjuvant chemotherapy with dose dense Adriamycin and Cytoxan every 2 weeks x4 followed by Taxol weekly x12 2.Followed by mastectomy 3.Followed by radiation 4.Followed by antiestrogen therapy. ----------------------------------------------------------------------------------------------------------------------------------------------------- Current treatment: Completed 4 cycles ofdose dense Adriamycin and Cytoxan, today is cycle 2 Taxol Echocardiogram 11/05/2018: EF 55 to 60%  Taxol toxicities: 1.Chemotherapy-induced anemia: Hemoglobin 8.5. weare watching it very closely. If it drops below 8 she will need blood transfusion.  She is fairly short of breath but is able to maintain her activity. 2.  Monitoring for neuropathy.   She had an episode of numb feeling on her feet which went away after chemo.  Return to clinic weekly for chemo in2weeks to see me.    No orders of the defined types were placed in this encounter.  The patient has a good understanding of the overall plan. she agrees with it. she will call with any problems that may develop before the next visit here.  Nicholas Lose, MD 01/13/2019  Julious Oka Dorshimer am acting as scribe for Dr. Nicholas Lose.  I have reviewed the above documentation for accuracy and completeness, and I agree with the above.

## 2019-01-13 ENCOUNTER — Other Ambulatory Visit: Payer: Self-pay

## 2019-01-13 ENCOUNTER — Other Ambulatory Visit: Payer: Self-pay | Admitting: Hematology and Oncology

## 2019-01-13 ENCOUNTER — Inpatient Hospital Stay: Payer: BC Managed Care – PPO | Attending: Hematology and Oncology

## 2019-01-13 ENCOUNTER — Inpatient Hospital Stay (HOSPITAL_BASED_OUTPATIENT_CLINIC_OR_DEPARTMENT_OTHER): Payer: BC Managed Care – PPO | Admitting: Hematology and Oncology

## 2019-01-13 ENCOUNTER — Inpatient Hospital Stay: Payer: BC Managed Care – PPO

## 2019-01-13 VITALS — HR 97

## 2019-01-13 DIAGNOSIS — C773 Secondary and unspecified malignant neoplasm of axilla and upper limb lymph nodes: Secondary | ICD-10-CM | POA: Insufficient documentation

## 2019-01-13 DIAGNOSIS — C50412 Malignant neoplasm of upper-outer quadrant of left female breast: Secondary | ICD-10-CM

## 2019-01-13 DIAGNOSIS — Z5111 Encounter for antineoplastic chemotherapy: Secondary | ICD-10-CM | POA: Diagnosis present

## 2019-01-13 DIAGNOSIS — D6481 Anemia due to antineoplastic chemotherapy: Secondary | ICD-10-CM

## 2019-01-13 DIAGNOSIS — Z170 Estrogen receptor positive status [ER+]: Secondary | ICD-10-CM

## 2019-01-13 DIAGNOSIS — Z95828 Presence of other vascular implants and grafts: Secondary | ICD-10-CM

## 2019-01-13 DIAGNOSIS — R5383 Other fatigue: Secondary | ICD-10-CM

## 2019-01-13 DIAGNOSIS — R200 Anesthesia of skin: Secondary | ICD-10-CM

## 2019-01-13 LAB — CBC WITH DIFFERENTIAL (CANCER CENTER ONLY)
Abs Immature Granulocytes: 0.05 10*3/uL (ref 0.00–0.07)
Basophils Absolute: 0 10*3/uL (ref 0.0–0.1)
Basophils Relative: 1 %
Eosinophils Absolute: 0 10*3/uL (ref 0.0–0.5)
Eosinophils Relative: 1 %
HCT: 26 % — ABNORMAL LOW (ref 36.0–46.0)
Hemoglobin: 8.5 g/dL — ABNORMAL LOW (ref 12.0–15.0)
Immature Granulocytes: 1 %
Lymphocytes Relative: 11 %
Lymphs Abs: 0.5 10*3/uL — ABNORMAL LOW (ref 0.7–4.0)
MCH: 29 pg (ref 26.0–34.0)
MCHC: 32.7 g/dL (ref 30.0–36.0)
MCV: 88.7 fL (ref 80.0–100.0)
Monocytes Absolute: 0.4 10*3/uL (ref 0.1–1.0)
Monocytes Relative: 11 %
Neutro Abs: 3.1 10*3/uL (ref 1.7–7.7)
Neutrophils Relative %: 75 %
Platelet Count: 249 10*3/uL (ref 150–400)
RBC: 2.93 MIL/uL — ABNORMAL LOW (ref 3.87–5.11)
RDW: 16.3 % — ABNORMAL HIGH (ref 11.5–15.5)
WBC Count: 4.1 10*3/uL (ref 4.0–10.5)
nRBC: 0 % (ref 0.0–0.2)

## 2019-01-13 LAB — CMP (CANCER CENTER ONLY)
ALT: 14 U/L (ref 0–44)
AST: 13 U/L — ABNORMAL LOW (ref 15–41)
Albumin: 3.5 g/dL (ref 3.5–5.0)
Alkaline Phosphatase: 60 U/L (ref 38–126)
Anion gap: 10 (ref 5–15)
BUN: 13 mg/dL (ref 6–20)
CO2: 25 mmol/L (ref 22–32)
Calcium: 8.9 mg/dL (ref 8.9–10.3)
Chloride: 106 mmol/L (ref 98–111)
Creatinine: 0.81 mg/dL (ref 0.44–1.00)
GFR, Est AFR Am: >60 mL/min (ref >60)
GFR, Estimated: >60 mL/min (ref >60)
Glucose, Bld: 103 mg/dL — ABNORMAL HIGH (ref 70–99)
Potassium: 3.8 mmol/L (ref 3.5–5.1)
Sodium: 141 mmol/L (ref 135–145)
Total Bilirubin: 0.4 mg/dL (ref 0.3–1.2)
Total Protein: 6.6 g/dL (ref 6.5–8.1)

## 2019-01-13 MED ORDER — SODIUM CHLORIDE 0.9 % IV SOLN
20.0000 mg | Freq: Once | INTRAVENOUS | Status: DC
  Filled 2019-01-13: qty 2

## 2019-01-13 MED ORDER — SODIUM CHLORIDE 0.9% FLUSH
10.0000 mL | INTRAVENOUS | Status: DC | PRN
  Administered 2019-01-13: 10 mL
  Filled 2019-01-13: qty 10

## 2019-01-13 MED ORDER — SODIUM CHLORIDE 0.9 % IV SOLN
Freq: Once | INTRAVENOUS | Status: AC
  Administered 2019-01-13: 11:00:00 via INTRAVENOUS
  Filled 2019-01-13: qty 250

## 2019-01-13 MED ORDER — SODIUM CHLORIDE 0.9 % IV SOLN
20.0000 mg | Freq: Once | INTRAVENOUS | Status: AC
  Administered 2019-01-13: 20 mg via INTRAVENOUS
  Filled 2019-01-13: qty 20

## 2019-01-13 MED ORDER — SODIUM CHLORIDE 0.9 % IV SOLN
80.0000 mg/m2 | Freq: Once | INTRAVENOUS | Status: AC
  Administered 2019-01-13: 210 mg via INTRAVENOUS
  Filled 2019-01-13: qty 35

## 2019-01-13 MED ORDER — FAMOTIDINE IN NACL 20-0.9 MG/50ML-% IV SOLN: 20.0000 mg | Freq: Once | INTRAVENOUS | Status: DC

## 2019-01-13 MED ORDER — DIPHENHYDRAMINE HCL 50 MG/ML IJ SOLN
50.0000 mg | Freq: Once | INTRAMUSCULAR | Status: AC
  Administered 2019-01-13: 50 mg via INTRAVENOUS

## 2019-01-13 MED ORDER — SODIUM CHLORIDE 0.9 % IV SOLN
20.0000 mg | Freq: Once | INTRAVENOUS | Status: AC
  Administered 2019-01-13: 20 mg via INTRAVENOUS
  Filled 2019-01-13: qty 2

## 2019-01-13 MED ORDER — HEPARIN SOD (PORK) LOCK FLUSH 100 UNIT/ML IV SOLN
500.0000 [IU] | Freq: Once | INTRAVENOUS | Status: AC | PRN
  Administered 2019-01-13: 14:00:00 500 [IU]
  Filled 2019-01-13: qty 5

## 2019-01-13 MED ORDER — DIPHENHYDRAMINE HCL 50 MG/ML IJ SOLN
INTRAMUSCULAR | Status: AC
  Filled 2019-01-13: qty 1

## 2019-01-13 NOTE — Addendum Note (Signed)
Addended by: Carolynne Edouard B on: 01/13/2019 02:12 PM   Modules accepted: Orders

## 2019-01-13 NOTE — Assessment & Plan Note (Signed)
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  11/01/2018: MRI biopsy left breast IDC grade 1, ER PR positive HER-2 negative  Treatment plan: 1.Neoadjuvant chemotherapy with dose dense Adriamycin and Cytoxan every 2 weeks x4 followed by Taxol weekly x12 2.Followed by mastectomy 3.Followed by radiation 4.Followed by antiestrogen therapy. ----------------------------------------------------------------------------------------------------------------------------------------------------- Current treatment: Completed 4 cycles ofdose dense Adriamycin and Cytoxan, today is cycle 2 Taxol Echocardiogram 11/05/2018: EF 55 to 60%  Taxol toxicities: 1.Chemotherapy-induced anemia: Hemoglobin 8.7. weare watching it very closely. If it drops below 8 she will need blood transfusion.  Return to clinic in2weeksfor cycle4 Taxol

## 2019-01-13 NOTE — Patient Instructions (Signed)
[facility] Discharge Instructions for Patients Receiving Chemotherapy  Today you received the following chemotherapy agents: Taxol To help prevent nausea and vomiting after your treatment, we encourage you to take your nausea medication as directed.   If you develop nausea and vomiting that is not controlled by your nausea medication, call the clinic.   BELOW ARE SYMPTOMS THAT SHOULD BE REPORTED IMMEDIATELY:  *FEVER GREATER THAN 100.5 F  *CHILLS WITH OR WITHOUT FEVER  NAUSEA AND VOMITING THAT IS NOT CONTROLLED WITH YOUR NAUSEA MEDICATION  *UNUSUAL SHORTNESS OF BREATH  *UNUSUAL BRUISING OR BLEEDING  TENDERNESS IN MOUTH AND THROAT WITH OR WITHOUT PRESENCE OF ULCERS  *URINARY PROBLEMS  *BOWEL PROBLEMS  UNUSUAL RASH Items with * indicate a potential emergency and should be followed up as soon as possible.  Feel free to call the clinic should you have any questions or concerns. The clinic phone number is [phone].  Please show the [facility] at check-in to the Emergency Department and triage nurse.   Paclitaxel injection(Taxol) What is this medicine? PACLITAXEL (PAK li TAX el) is a chemotherapy drug. It targets fast dividing cells, like cancer cells, and causes these cells to die. This medicine is used to treat ovarian cancer, breast cancer, lung cancer, Kaposi's sarcoma, and other cancers. This medicine may be used for other purposes; ask your health care provider or pharmacist if you have questions. COMMON BRAND NAME(S): Onxol, Taxol What should I tell my health care provider before I take this medicine? They need to know if you have any of these conditions: -history of irregular heartbeat -liver disease -low blood counts, like low white cell, platelet, or red cell counts -lung or breathing disease, like asthma -tingling of the fingers or toes, or other nerve disorder -an unusual or allergic reaction to paclitaxel, alcohol, polyoxyethylated castor  oil, other chemotherapy, other medicines, foods, dyes, or preservatives -pregnant or trying to get pregnant -breast-feeding How should I use this medicine? This drug is given as an infusion into a vein. It is administered in a hospital or clinic by a specially trained health care professional. Talk to your pediatrician regarding the use of this medicine in children. Special care may be needed. Overdosage: If you think you have taken too much of this medicine contact a poison control center or emergency room at once. NOTE: This medicine is only for you. Do not share this medicine with others. What if I miss a dose? It is important not to miss your dose. Call your doctor or health care professional if you are unable to keep an appointment. What may interact with this medicine? Do not take this medicine with any of the following medications: -disulfiram -metronidazole This medicine may also interact with the following medications: -antiviral medicines for hepatitis, HIV or AIDS -certain antibiotics like erythromycin and clarithromycin -certain medicines for fungal infections like ketoconazole and itraconazole -certain medicines for seizures like carbamazepine, phenobarbital, phenytoin -gemfibrozil -nefazodone -rifampin -St. John's wort This list may not describe all possible interactions. Give your health care provider a list of all the medicines, herbs, non-prescription drugs, or dietary supplements you use. Also tell them if you smoke, drink alcohol, or use illegal drugs. Some items may interact with your medicine. What should I watch for while using this medicine? Your condition will be monitored carefully while you are receiving this medicine. You will need important blood work done while you are taking this medicine. This medicine can cause serious allergic reactions. To reduce your risk  you will need to take other medicine(s) before treatment with this medicine. If you experience  allergic reactions like skin rash, itching or hives, swelling of the face, lips, or tongue, tell your doctor or health care professional right away. In some cases, you may be given additional medicines to help with side effects. Follow all directions for their use. This drug may make you feel generally unwell. This is not uncommon, as chemotherapy can affect healthy cells as well as cancer cells. Report any side effects. Continue your course of treatment even though you feel ill unless your doctor tells you to stop. Call your doctor or health care professional for advice if you get a fever, chills or sore throat, or other symptoms of a cold or flu. Do not treat yourself. This drug decreases your body's ability to fight infections. Try to avoid being around people who are sick. This medicine may increase your risk to bruise or bleed. Call your doctor or health care professional if you notice any unusual bleeding. Be careful brushing and flossing your teeth or using a toothpick because you may get an infection or bleed more easily. If you have any dental work done, tell your dentist you are receiving this medicine. Avoid taking products that contain aspirin, acetaminophen, ibuprofen, naproxen, or ketoprofen unless instructed by your doctor. These medicines may hide a fever. Do not become pregnant while taking this medicine. Women should inform their doctor if they wish to become pregnant or think they might be pregnant. There is a potential for serious side effects to an unborn child. Talk to your health care professional or pharmacist for more information. Do not breast-feed an infant while taking this medicine. Men are advised not to father a child while receiving this medicine. This product may contain alcohol. Ask your pharmacist or healthcare provider if this medicine contains alcohol. Be sure to tell all healthcare providers you are taking this medicine. Certain medicines, like metronidazole and  disulfiram, can cause an unpleasant reaction when taken with alcohol. The reaction includes flushing, headache, nausea, vomiting, sweating, and increased thirst. The reaction can last from 30 minutes to several hours. What side effects may I notice from receiving this medicine? Side effects that you should report to your doctor or health care professional as soon as possible: -allergic reactions like skin rash, itching or hives, swelling of the face, lips, or tongue -breathing problems -changes in vision -fast, irregular heartbeat -high or low blood pressure -mouth sores -pain, tingling, numbness in the hands or feet -signs of decreased platelets or bleeding - bruising, pinpoint red spots on the skin, black, tarry stools, blood in the urine -signs of decreased red blood cells - unusually weak or tired, feeling faint or lightheaded, falls -signs of infection - fever or chills, cough, sore throat, pain or difficulty passing urine -signs and symptoms of liver injury like dark yellow or brown urine; general ill feeling or flu-like symptoms; light-colored stools; loss of appetite; nausea; right upper belly pain; unusually weak or tired; yellowing of the eyes or skin -swelling of the ankles, feet, hands -unusually slow heartbeat Side effects that usually do not require medical attention (report to your doctor or health care professional if they continue or are bothersome): -diarrhea -hair loss -loss of appetite -muscle or joint pain -nausea, vomiting -pain, redness, or irritation at site where injected -tiredness This list may not describe all possible side effects. Call your doctor for medical advice about side effects. You may report side effects to FDA  at 1-800-FDA-1088. Where should I keep my medicine? This drug is given in a hospital or clinic and will not be stored at home. NOTE: This sheet is a summary. It may not cover all possible information. If you have questions about this medicine,  talk to your doctor, pharmacist, or health care provider.  2019 Elsevier/Gold Standard (2017-06-02 13:14:55)

## 2019-01-18 NOTE — Assessment & Plan Note (Signed)
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  11/01/2018: MRI biopsy left breast IDC grade 1, ER PR positive HER-2 negative  Treatment plan: 1.Neoadjuvant chemotherapy with dose dense Adriamycin and Cytoxan every 2 weeks x4 followed by Taxol weekly x12 2.Followed by mastectomy 3.Followed by radiation 4.Followed by antiestrogen therapy. ----------------------------------------------------------------------------------------------------------------------------------------------------- Current treatment:Completed 4 cycles ofdose dense Adriamycin and Cytoxan, today is cycle 4 Taxol Echocardiogram 11/05/2018: EF 55 to 60%  Taxol toxicities: 1.Chemotherapy-induced anemia: Hemoglobin  .weare watching it very closely. If it drops below 8 she will need blood transfusion.  She is fairly short of breath but is able to maintain her activity. 2.  Monitoring for neuropathy.  She had an episode of numb feeling on her feet which went away after chemo.  Return to clinic weekly for chemo in2weeks to see me.

## 2019-01-20 ENCOUNTER — Other Ambulatory Visit: Payer: BC Managed Care – PPO

## 2019-01-20 ENCOUNTER — Other Ambulatory Visit: Payer: Self-pay

## 2019-01-20 ENCOUNTER — Telehealth: Payer: Self-pay | Admitting: Hematology and Oncology

## 2019-01-20 ENCOUNTER — Inpatient Hospital Stay: Payer: BC Managed Care – PPO

## 2019-01-20 ENCOUNTER — Ambulatory Visit: Payer: BC Managed Care – PPO

## 2019-01-20 VITALS — BP 126/83 | HR 87 | Temp 98.3°F | Resp 18 | Wt 276.5 lb

## 2019-01-20 DIAGNOSIS — C50412 Malignant neoplasm of upper-outer quadrant of left female breast: Secondary | ICD-10-CM

## 2019-01-20 DIAGNOSIS — Z170 Estrogen receptor positive status [ER+]: Principal | ICD-10-CM

## 2019-01-20 DIAGNOSIS — Z5111 Encounter for antineoplastic chemotherapy: Secondary | ICD-10-CM | POA: Diagnosis not present

## 2019-01-20 DIAGNOSIS — Z95828 Presence of other vascular implants and grafts: Secondary | ICD-10-CM

## 2019-01-20 LAB — CBC WITH DIFFERENTIAL (CANCER CENTER ONLY)
Abs Immature Granulocytes: 0.03 10*3/uL (ref 0.00–0.07)
Basophils Absolute: 0 10*3/uL (ref 0.0–0.1)
Basophils Relative: 1 %
Eosinophils Absolute: 0.1 10*3/uL (ref 0.0–0.5)
Eosinophils Relative: 2 %
HCT: 26 % — ABNORMAL LOW (ref 36.0–46.0)
Hemoglobin: 8.2 g/dL — ABNORMAL LOW (ref 12.0–15.0)
Immature Granulocytes: 1 %
Lymphocytes Relative: 10 %
Lymphs Abs: 0.4 10*3/uL — ABNORMAL LOW (ref 0.7–4.0)
MCH: 29.4 pg (ref 26.0–34.0)
MCHC: 31.5 g/dL (ref 30.0–36.0)
MCV: 93.2 fL (ref 80.0–100.0)
Monocytes Absolute: 0.4 10*3/uL (ref 0.1–1.0)
Monocytes Relative: 10 %
Neutro Abs: 3 10*3/uL (ref 1.7–7.7)
Neutrophils Relative %: 76 %
Platelet Count: 184 10*3/uL (ref 150–400)
RBC: 2.79 MIL/uL — ABNORMAL LOW (ref 3.87–5.11)
RDW: 17.4 % — ABNORMAL HIGH (ref 11.5–15.5)
WBC Count: 3.9 10*3/uL — ABNORMAL LOW (ref 4.0–10.5)
nRBC: 0 % (ref 0.0–0.2)

## 2019-01-20 LAB — CMP (CANCER CENTER ONLY)
ALT: 14 U/L (ref 10–47)
AST: 16 U/L (ref 11–38)
Albumin: 3.4 g/dL — ABNORMAL LOW (ref 3.5–5.0)
Alkaline Phosphatase: 53 U/L (ref 38–126)
Anion gap: 10 (ref 5–15)
BUN: 10 mg/dL (ref 6–20)
CO2: 22 mmol/L (ref 22–32)
Calcium: 8.9 mg/dL (ref 8.9–10.3)
Chloride: 108 mmol/L (ref 98–111)
Creatinine: 0.88 mg/dL (ref 0.60–1.20)
GFR, Est AFR Am: >60 mL/min (ref >60)
GFR, Estimated: >60 mL/min (ref >60)
Glucose, Bld: 89 mg/dL (ref 70–99)
Potassium: 3.7 mmol/L (ref 3.5–5.1)
Sodium: 140 mmol/L (ref 135–145)
Total Bilirubin: 0.5 mg/dL (ref 0.2–1.6)
Total Protein: 6.5 g/dL (ref 6.5–8.1)

## 2019-01-20 MED ORDER — SODIUM CHLORIDE 0.9 % IV SOLN
80.0000 mg/m2 | Freq: Once | INTRAVENOUS | Status: AC
  Administered 2019-01-20: 11:00:00 210 mg via INTRAVENOUS
  Filled 2019-01-20: qty 35

## 2019-01-20 MED ORDER — HEPARIN SOD (PORK) LOCK FLUSH 100 UNIT/ML IV SOLN
500.0000 [IU] | Freq: Once | INTRAVENOUS | Status: AC | PRN
  Administered 2019-01-20: 500 [IU]
  Filled 2019-01-20: qty 5

## 2019-01-20 MED ORDER — SODIUM CHLORIDE 0.9% FLUSH
10.0000 mL | INTRAVENOUS | Status: DC | PRN
  Administered 2019-01-20: 12:00:00 10 mL
  Filled 2019-01-20: qty 10

## 2019-01-20 MED ORDER — SODIUM CHLORIDE 0.9 % IV SOLN
20.0000 mg | Freq: Once | INTRAVENOUS | Status: AC
  Administered 2019-01-20: 10:00:00 20 mg via INTRAVENOUS
  Filled 2019-01-20: qty 20

## 2019-01-20 MED ORDER — FAMOTIDINE IN NACL 20-0.9 MG/50ML-% IV SOLN: 20.0000 mg | Freq: Once | INTRAVENOUS | Status: DC

## 2019-01-20 MED ORDER — SODIUM CHLORIDE 0.9% FLUSH
10.0000 mL | INTRAVENOUS | Status: DC | PRN
  Administered 2019-01-20: 10 mL
  Filled 2019-01-20: qty 10

## 2019-01-20 MED ORDER — DIPHENHYDRAMINE HCL 50 MG/ML IJ SOLN
INTRAMUSCULAR | Status: AC
  Filled 2019-01-20: qty 1

## 2019-01-20 MED ORDER — SODIUM CHLORIDE 0.9 % IV SOLN
Freq: Once | INTRAVENOUS | Status: AC
  Administered 2019-01-20: 09:00:00 via INTRAVENOUS
  Filled 2019-01-20: qty 250

## 2019-01-20 MED ORDER — SODIUM CHLORIDE 0.9 % IV SOLN
20.0000 mg | Freq: Once | INTRAVENOUS | Status: AC
  Administered 2019-01-20: 10:00:00 20 mg via INTRAVENOUS
  Filled 2019-01-20: qty 2

## 2019-01-20 MED ORDER — DIPHENHYDRAMINE HCL 50 MG/ML IJ SOLN
50.0000 mg | Freq: Once | INTRAMUSCULAR | Status: AC
  Administered 2019-01-20: 09:00:00 50 mg via INTRAVENOUS

## 2019-01-20 NOTE — Patient Instructions (Signed)
[facility] Discharge Instructions for Patients Receiving Chemotherapy  Today you received the following chemotherapy agents Paclitaxel (TAXOL).  To help prevent nausea and vomiting after your treatment, we encourage you to take your nausea medication as prescribed.  If you develop nausea and vomiting that is not controlled by your nausea medication, call the clinic.   BELOW ARE SYMPTOMS THAT SHOULD BE REPORTED IMMEDIATELY:  *FEVER GREATER THAN 100.5 F  *CHILLS WITH OR WITHOUT FEVER  NAUSEA AND VOMITING THAT IS NOT CONTROLLED WITH YOUR NAUSEA MEDICATION  *UNUSUAL SHORTNESS OF BREATH  *UNUSUAL BRUISING OR BLEEDING  TENDERNESS IN MOUTH AND THROAT WITH OR WITHOUT PRESENCE OF ULCERS  *URINARY PROBLEMS  *BOWEL PROBLEMS  UNUSUAL RASH Items with * indicate a potential emergency and should be followed up as soon as possible.  Feel free to call the clinic should you have any questions or concerns. The clinic phone number is [phone].  Please show the CHEMO ALERT CARD at check-in to the Emergency Department and triage nurse.  Coronavirus (COVID-19) Are you at risk?  Are you at risk for the Coronavirus (COVID-19)?  To be considered HIGH RISK for Coronavirus (COVID-19), you have to meet the following criteria:  . Traveled to [place], [place], [place], [place] or [place]; or in the [place] to [place], [place], [place], or [place]; and have fever, cough, and shortness of breath within the last 2 weeks of travel OR . Been in close contact with a person diagnosed with COVID-19 within the last 2 weeks and have fever, cough, and shortness of breath . IF YOU DO NOT MEET THESE CRITERIA, YOU ARE CONSIDERED LOW RISK FOR COVID-19.  What to do if you are HIGH RISK for COVID-19?  . If you are having a medical emergency, call 911. . Seek medical care right away. Before you go to a doctor's office, urgent care or emergency department, call ahead and tell them about  your recent travel, contact with someone diagnosed with COVID-19, and your symptoms. You should receive instructions from your physician's office regarding next steps of care.  . When you arrive at healthcare provider, tell the healthcare staff immediately you have returned from visiting [place], [place], [place], [place] or [place]; or traveled in the [place] to [place], [place], [place], or [place]; in the last two weeks or you have been in close contact with a person diagnosed with COVID-19 in the last 2 weeks.   . Tell the health care staff about your symptoms: fever, cough and shortness of breath. . After you have been seen by a medical provider, you will be either: o Tested for (COVID-19) and discharged home on quarantine except to seek medical care if symptoms worsen, and asked to  - Stay home and avoid contact with others until you get your results (4-5 days)  - Avoid travel on public transportation if possible (such as bus, train, or airplane) or o Sent to the Emergency Department by EMS for evaluation, COVID-19 testing, and possible admission depending on your condition and test results.  What to do if you are LOW RISK for COVID-19?  Reduce your risk of any infection by using the same precautions used for avoiding the common cold or flu:  . Wash your hands often with soap and warm water for at least 20 seconds.  If soap and water are not readily available, use an alcohol-based hand sanitizer with at least 60% alcohol.  . If coughing or sneezing,   cover your mouth and nose by coughing or sneezing into the elbow areas of your shirt or coat, into a tissue or into your sleeve (not your hands). . Avoid shaking hands with others and consider head nods or verbal greetings only. . Avoid touching your eyes, nose, or mouth with unwashed hands.  . Avoid close contact with people who are sick. . Avoid places or events with large numbers of people in one location, like concerts or sporting  events. . Carefully consider travel plans you have or are making. . If you are planning any travel outside or inside the [place], visit the CDC's Travelers' Health webpage for the latest health notices. . If you have some symptoms but not all symptoms, continue to monitor at home and seek medical attention if your symptoms worsen. . If you are having a medical emergency, call 911.   ADDITIONAL HEALTHCARE OPTIONS FOR PATIENTS  [facility] / e-Visit: [email or web address]         [facility]: [phone]  [facility]: [phone]                   [facility]: [phone]     

## 2019-01-20 NOTE — Telephone Encounter (Signed)
Called patient re request for earlier appointment time next week. Spoke with patient and appointment time will remain as scheduled.  Patient would like results from today's lab. Message to routed to desk nurse.

## 2019-01-24 ENCOUNTER — Telehealth: Payer: Self-pay

## 2019-01-24 NOTE — Telephone Encounter (Signed)
Nurse spoke with patient regarding potential side effects of chemotherapy. Patient reports numbness to bilateral feet.  Unable to feel floor on certain parts when feet are placed.  Feels comfortable with shoes on, denied difficulty walking.  Notice shaking to right hand only, that did subside.  Pt concerned regarding side effects however does not have her chemotherapy changed.   Nurse provided education on side effects of Taxol.  Patient voiced understanding.  MD appointment prior to next infusion.  Nurse educated this will be reviewed with MD for further recommendations.  Patient appreciated return call.

## 2019-01-24 NOTE — Telephone Encounter (Signed)
Nurse returned call.  No answer, no option to leave voicemail.

## 2019-01-25 ENCOUNTER — Telehealth: Payer: Self-pay | Admitting: *Deleted

## 2019-01-25 NOTE — Telephone Encounter (Signed)
Spoke with Dr. Lindi Adie regarding pts increase in neuropathy.  Per Dr. Lindi Adie, okay to continue with taxol with possible dose reduction.  Pt has apt this week on April 16th with Dr. Lindi Adie and he will assess pt at that time.  This RN called pt and let her know the plan, pt verbalized understanding.

## 2019-01-26 NOTE — Progress Notes (Signed)
Patient Care Team: [name], NP as PCP - General Erroll Luna, MD as Consulting Physician (General Surgery) Nicholas Lose, MD as Consulting Physician (Hematology and Oncology) Kyung Rudd, MD as Consulting Physician (Radiation Oncology)  DIAGNOSIS:    ICD-10-CM   1. Malignant neoplasm of upper-outer quadrant of left breast in female, estrogen receptor positive ([place]) C50.412 MR BREAST BILATERAL W WO CONTRAST INC CAD   Z17.0   2. Port-A-Cath in place Z95.828 alteplase (CATHFLO ACTIVASE) injection 2 mg    heparin lock flush 100 unit/mL    SUMMARY OF ONCOLOGIC HISTORY:   Malignant neoplasm of upper-outer quadrant of left breast in female, estrogen receptor positive ([place])   10/22/2018 Initial Diagnosis    Screening detected left breast architectural distortion: 4.5 cm, UOQ, by ultrasound measured 5 cm at 1 o'clock position multiple enlarged lymph nodes, biopsy revealed grade 1 IDC with DCIS with lymphovascular invasion, lymph node biopsy positive, intramammary lymph node biopsy negative, ER 100%, PR 100%, Ki-67 20%, HER-2 1+ by IHC, T2 and 1 stage 2A    10/27/2018 Cancer Staging    Staging form: Breast, AJCC 8th Edition - Clinical: Stage IIA (cT2, cN1(f), cM0, G1, ER+, PR+, HER2-) - Signed by Nicholas Lose, MD on 11/01/2018    11/11/2018 -  Neo-Adjuvant Chemotherapy    Neoadjuvant chemotherapy with dose dense Adriamycin and Cytoxan every 2 weeks x4 followed by Taxol weekly x12     CHIEF COMPLIANT: Cycle 4 Taxol being discontinued due to neuropathy  INTERVAL HISTORY: [name] is a 58 y.o. with above-mentioned history of left breast cancer who completed 4 cycles of neoadjuvant chemotherapy with dose dense Adriamycin and Cytoxan and presents to the clinic alone today for cycle 4 of weekly Taxol treatments.  She has very severe neuropathy that developed over the past week.  She has dropped objects and cannot feel the bottom of the feet.  She has been anxious about  walking in and out of her bathroom.  REVIEW OF SYSTEMS:   Constitutional: Denies fevers, chills or abnormal weight loss Eyes: Denies blurriness of vision Ears, nose, mouth, throat, and face: Denies mucositis or sore throat Respiratory: Denies cough, dyspnea or wheezes Cardiovascular: Denies palpitation, chest discomfort Gastrointestinal: Denies nausea, heartburn or change in bowel habits Skin: Denies abnormal skin rashes Lymphatics: Denies new lymphadenopathy or easy bruising Neurological: Profound peripheral neuropathy Behavioral/Psych: Mood is stable, no new changes  Extremities: No lower extremity edema Breast: denies any pain or lumps or nodules in either breasts All other systems were reviewed with the patient and are negative.  I have reviewed the past medical history, past surgical history, social history and family history with the patient and they are unchanged from previous note.  ALLERGIES:  has No Known Allergies.  MEDICATIONS:  Current Outpatient Medications  Medication Sig Dispense Refill  . acetaminophen (TYLENOL) 500 MG tablet Take 1,000 mg by mouth every 6 (six) hours as needed (pain after Neulasta injection.).    Marland Kitchen carvedilol (COREG) 6.25 MG tablet TAKE 1 TABLET BY MOUTH TWICE DAILY (Patient taking differently: Take 6.25 mg by mouth 2 (two) times daily with a meal. ) 180 tablet 1  . lidocaine-prilocaine (EMLA) cream Apply to affected area once 30 g 3  . lisinopril-hydrochlorothiazide (PRINZIDE,ZESTORETIC) 20-12.5 MG per tablet TAKE 2 TABLETS BY MOUTH DAILY (Patient taking differently: Take 1 tablet by mouth daily. ) 180 tablet 1  . ondansetron (ZOFRAN) 8 MG tablet Take 1 tablet (8 mg total) by mouth 2 (two) times daily as  needed. Start on the third day after chemotherapy. 30 tablet 1  . oxyCODONE (OXY IR/ROXICODONE) 5 MG immediate release tablet Take 1 tablet (5 mg total) by mouth every 6 (six) hours as needed for severe pain. 12 tablet 0  . pantoprazole (PROTONIX) 40  MG tablet TAKE 1 TABLET BY MOUTH EVERY DAY 30 tablet 0  . prochlorperazine (COMPAZINE) 10 MG tablet Take 1 tablet (10 mg total) by mouth every 6 (six) hours as needed (Nausea or vomiting). 30 tablet 1  . ranitidine (ZANTAC) 150 MG tablet Take 150 mg by mouth daily as needed for heartburn.     Current Facility-Administered Medications  Medication Dose Route Frequency Provider Last Rate Last Dose  . alteplase (CATHFLO ACTIVASE) injection 2 mg  2 mg Intracatheter Once PRN Nicholas Lose, MD      . heparin lock flush 100 unit/mL  500 Units Intracatheter Once PRN Nicholas Lose, MD       Facility-Administered Medications Ordered in Other Visits  Medication Dose Route Frequency Provider Last Rate Last Dose  . gadopentetate dimeglumine (MAGNEVIST) injection 20 mL  20 mL Intravenous Once PRN Melvenia Beam, MD      . gadopentetate dimeglumine (MAGNEVIST) injection 20 mL  20 mL Intravenous Once PRN Melvenia Beam, MD        PHYSICAL EXAMINATION: ECOG PERFORMANCE STATUS: 1 - Symptomatic but completely ambulatory  Vitals:   01/27/19 1355  BP: 109/76  Pulse: (!) 108  Resp: 18  Temp: 97.7 F (36.5 C)  SpO2: 100%   Filed Weights   01/27/19 1355  Weight: 272 lb 14.4 oz (123.8 kg)    GENERAL: alert, no distress and comfortable SKIN: skin color, texture, turgor are normal, no rashes or significant lesions EYES: normal, Conjunctiva are pink and non-injected, sclera clear OROPHARYNX: no exudate, no erythema and lips, buccal mucosa, and tongue normal  NECK: supple, thyroid normal size, non-tender, without nodularity LYMPH: no palpable lymphadenopathy in the cervical, axillary or inguinal LUNGS: clear to auscultation and percussion with normal breathing effort HEART: regular rate & rhythm and no murmurs and no lower extremity edema ABDOMEN: abdomen soft, non-tender and normal bowel sounds MUSCULOSKELETAL: no cyanosis of digits and no clubbing  NEURO: alert & oriented x 3 with fluent speech, no  focal motor/sensory deficits EXTREMITIES: No lower extremity edema  LABORATORY DATA:  I have reviewed the data as listed CMP Latest Ref Rng & Units 01/27/2019 01/20/2019 01/13/2019  Glucose 70 - 99 mg/dL 104(H) 89 103(H)  BUN 6 - 20 mg/dL '13 10 13  '$ Creatinine 0.44 - 1.00 mg/dL 0.82 0.88 0.81  Sodium 135 - 145 mmol/L 141 140 141  Potassium 3.5 - 5.1 mmol/L 3.2(L) 3.7 3.8  Chloride 98 - 111 mmol/L 107 108 106  CO2 22 - 32 mmol/L '25 22 25  '$ Calcium 8.9 - 10.3 mg/dL 8.9 8.9 8.9  Total Protein 6.5 - 8.1 g/dL 6.6 6.5 6.6  Total Bilirubin 0.3 - 1.2 mg/dL 0.4 0.5 0.4  Alkaline Phos 38 - 126 U/L 55 53 60  AST 15 - 41 U/L 16 16 13(L)  ALT 0 - 44 U/L '13 14 14    '$ Lab Results  Component Value Date   WBC 4.1 01/27/2019   HGB 8.7 (L) 01/27/2019   HCT 27.0 (L) 01/27/2019   MCV 92.8 01/27/2019   PLT 193 01/27/2019   NEUTROABS 3.0 01/27/2019    ASSESSMENT & PLAN:  Malignant neoplasm of upper-outer quadrant of left breast in female, estrogen receptor positive ([place])  10/22/2018:Screening detected left breast architectural distortion: 4.5 cm, UOQ, by ultrasound measured 5 cm at 1 o'clock position multiple enlarged lymph nodes, biopsy revealed grade 1 IDC with DCIS with lymphovascular invasion, lymph node biopsy positive, intramammary lymph node biopsy negative, ER 100%, PR 100%, Ki-67 20%, HER-2 1+ by IHC, T2 and 1 stage Ib  10/28/2018: Breast MRI: Non-mass enhancement with architectural distortion UOQ left breast 5 x 3 x 10 cm,4enlarged level 1 axillary lymph nodes.  11/01/2018: MRI biopsy left breast IDC grade 1, ER PR positive HER-2 negative  Treatment plan: 1.Neoadjuvant chemotherapy with dose dense Adriamycin and Cytoxan every 2 weeks x4 followed by Taxol weekly x12 2.Followed by mastectomy 3.Followed by radiation 4.Followed by antiestrogen therapy.  ----------------------------------------------------------------------------------------------------------------------------------------------------- Current treatment:Completed 4 cycles ofdose dense Adriamycin and Cytoxan, today is cycle 4 Taxol but will be discontinued because of worsening neuropathy. Echocardiogram 11/05/2018: EF 55 to 60%  Taxol toxicities: 1.Chemotherapy-induced anemia: Hemoglobin  .weare watching it very closely. If it drops below 8 she will need blood transfusion.  She is fairly short of breath but is able to maintain her activity. 2.    Severe peripheral neuropathy because of rapid worsening neuropathy, I recommended discontinuation of further Taxol treatments.  We will set her up for breast MRI next week. I will see her after the breast MRI to discuss results from WebEx visit. I have sent a message to Dr. Brantley Stage to follow the patient after the breast MRI. We will also be presenting her case in the breast tumor board. Since there is a potential that her surgery could be delayed, I instructed her to restart anastrozole therapy. She took anastrozole for 4 days prior to chemotherapy and tolerated it well.  Return to clinic 1 week after surgery  Orders Placed This Encounter  Procedures  . MR BREAST BILATERAL W WO CONTRAST INC CAD    Standing Status:   Future    Standing Expiration Date:   03/28/2020    Order Specific Question:   ** REASON FOR EXAM (FREE TEXT)    Answer:   Post neoadjuvant chemo scan    Order Specific Question:   If indicated for the ordered procedure, I authorize the administration of contrast media per Radiology protocol    Answer:   Yes    Order Specific Question:   What is the patient's sedation requirement?    Answer:   No Sedation    Order Specific Question:   Does the patient have a pacemaker or implanted devices?    Answer:   No    Order Specific Question:   Radiology Contrast Protocol - do NOT remove file path    Answer:    [email or web address]    Order Specific Question:   Preferred imaging location?    Answer:   [facility] (table limit-550lbs)   The patient has a good understanding of the overall plan. she agrees with it. she will call with any problems that may develop before the next visit here.  Nicholas Lose, MD 01/27/2019  Julious Oka Dorshimer am acting as scribe for Dr. Nicholas Lose.  I have reviewed the above documentation for accuracy and completeness, and I agree with the above.

## 2019-01-27 ENCOUNTER — Inpatient Hospital Stay (HOSPITAL_BASED_OUTPATIENT_CLINIC_OR_DEPARTMENT_OTHER): Payer: BC Managed Care – PPO | Admitting: Hematology and Oncology

## 2019-01-27 ENCOUNTER — Inpatient Hospital Stay: Payer: BC Managed Care – PPO

## 2019-01-27 ENCOUNTER — Other Ambulatory Visit: Payer: Self-pay

## 2019-01-27 VITALS — BP 109/76 | HR 108 | Temp 97.7°F | Resp 18 | Ht 74.0 in | Wt 272.9 lb

## 2019-01-27 DIAGNOSIS — C773 Secondary and unspecified malignant neoplasm of axilla and upper limb lymph nodes: Secondary | ICD-10-CM | POA: Diagnosis not present

## 2019-01-27 DIAGNOSIS — Z5111 Encounter for antineoplastic chemotherapy: Secondary | ICD-10-CM | POA: Diagnosis not present

## 2019-01-27 DIAGNOSIS — C50412 Malignant neoplasm of upper-outer quadrant of left female breast: Secondary | ICD-10-CM

## 2019-01-27 DIAGNOSIS — Z170 Estrogen receptor positive status [ER+]: Secondary | ICD-10-CM | POA: Diagnosis not present

## 2019-01-27 DIAGNOSIS — G620 Drug-induced polyneuropathy: Secondary | ICD-10-CM

## 2019-01-27 DIAGNOSIS — D6481 Anemia due to antineoplastic chemotherapy: Secondary | ICD-10-CM | POA: Diagnosis not present

## 2019-01-27 DIAGNOSIS — Z95828 Presence of other vascular implants and grafts: Secondary | ICD-10-CM

## 2019-01-27 LAB — CMP (CANCER CENTER ONLY)
ALT: 13 U/L (ref 0–44)
AST: 16 U/L (ref 15–41)
Albumin: 3.5 g/dL (ref 3.5–5.0)
Alkaline Phosphatase: 55 U/L (ref 38–126)
Anion gap: 9 (ref 5–15)
BUN: 13 mg/dL (ref 6–20)
CO2: 25 mmol/L (ref 22–32)
Calcium: 8.9 mg/dL (ref 8.9–10.3)
Chloride: 107 mmol/L (ref 98–111)
Creatinine: 0.82 mg/dL (ref 0.44–1.00)
GFR, Est AFR Am: >60 mL/min (ref >60)
GFR, Estimated: >60 mL/min (ref >60)
Glucose, Bld: 104 mg/dL — ABNORMAL HIGH (ref 70–99)
Potassium: 3.2 mmol/L — ABNORMAL LOW (ref 3.5–5.1)
Sodium: 141 mmol/L (ref 135–145)
Total Bilirubin: 0.4 mg/dL (ref 0.3–1.2)
Total Protein: 6.6 g/dL (ref 6.5–8.1)

## 2019-01-27 LAB — CBC WITH DIFFERENTIAL (CANCER CENTER ONLY)
Abs Immature Granulocytes: 0.03 10*3/uL (ref 0.00–0.07)
Basophils Absolute: 0 10*3/uL (ref 0.0–0.1)
Basophils Relative: 1 %
Eosinophils Absolute: 0.1 10*3/uL (ref 0.0–0.5)
Eosinophils Relative: 2 %
HCT: 27 % — ABNORMAL LOW (ref 36.0–46.0)
Hemoglobin: 8.7 g/dL — ABNORMAL LOW (ref 12.0–15.0)
Immature Granulocytes: 1 %
Lymphocytes Relative: 12 %
Lymphs Abs: 0.5 10*3/uL — ABNORMAL LOW (ref 0.7–4.0)
MCH: 29.9 pg (ref 26.0–34.0)
MCHC: 32.2 g/dL (ref 30.0–36.0)
MCV: 92.8 fL (ref 80.0–100.0)
Monocytes Absolute: 0.4 10*3/uL (ref 0.1–1.0)
Monocytes Relative: 10 %
Neutro Abs: 3 10*3/uL (ref 1.7–7.7)
Neutrophils Relative %: 74 %
Platelet Count: 193 10*3/uL (ref 150–400)
RBC: 2.91 MIL/uL — ABNORMAL LOW (ref 3.87–5.11)
RDW: 16.9 % — ABNORMAL HIGH (ref 11.5–15.5)
WBC Count: 4.1 10*3/uL (ref 4.0–10.5)
nRBC: 0 % (ref 0.0–0.2)

## 2019-01-27 MED ORDER — ALTEPLASE 2 MG IJ SOLR
2.0000 mg | Freq: Once | INTRAMUSCULAR | Status: DC | PRN
  Filled 2019-01-27: qty 2

## 2019-01-27 MED ORDER — HEPARIN SOD (PORK) LOCK FLUSH 100 UNIT/ML IV SOLN
500.0000 [IU] | Freq: Once | INTRAVENOUS | Status: DC | PRN
  Filled 2019-01-27: qty 5

## 2019-01-27 MED ORDER — SODIUM CHLORIDE 0.9% FLUSH
10.0000 mL | INTRAVENOUS | Status: DC | PRN
  Administered 2019-01-27: 14:00:00 10 mL
  Filled 2019-01-27: qty 10

## 2019-01-27 NOTE — Patient Instructions (Signed)

## 2019-01-28 ENCOUNTER — Telehealth: Payer: Self-pay | Admitting: *Deleted

## 2019-01-28 ENCOUNTER — Telehealth: Payer: Self-pay

## 2019-01-28 NOTE — Telephone Encounter (Signed)
Patient called to request to see if MRI could be moved up to an earlier date.  Pt voiced she was OK to travel elsewhere if she can get an appointment sooner.    Nurse placed several calls.  Santiago Glad @ [facility] was able to schedule for Breast MRI on 02/01/2019 @ 3pm with arrival time of 2:30.  Order faxed.  Patient aware.  Patient will contact [facility] to have films transferred to [facility] for MRI.

## 2019-01-28 NOTE — Telephone Encounter (Signed)
Spoke to pt concerning next steps. Informed will meet with Dr. Brantley Stage to discuss surgical plan. After surgery, xrt will be next step, followed by AI. Received verbal understanding. Contact information provided for further questions or needs.

## 2019-01-28 NOTE — Telephone Encounter (Signed)
Left vm with contact information for questions or needs regarding tx care plan. Discussed next steps

## 2019-01-29 ENCOUNTER — Other Ambulatory Visit: Payer: Self-pay | Admitting: Hematology and Oncology

## 2019-01-31 ENCOUNTER — Ambulatory Visit: Payer: BC Managed Care – PPO | Admitting: Podiatry

## 2019-01-31 ENCOUNTER — Encounter: Payer: Self-pay | Admitting: Podiatry

## 2019-01-31 ENCOUNTER — Other Ambulatory Visit: Payer: Self-pay

## 2019-01-31 VITALS — BP 116/81 | HR 95 | Temp 97.3°F

## 2019-01-31 DIAGNOSIS — M79674 Pain in right toe(s): Secondary | ICD-10-CM | POA: Diagnosis not present

## 2019-01-31 DIAGNOSIS — M79675 Pain in left toe(s): Secondary | ICD-10-CM | POA: Diagnosis not present

## 2019-01-31 DIAGNOSIS — G620 Drug-induced polyneuropathy: Secondary | ICD-10-CM | POA: Diagnosis not present

## 2019-01-31 DIAGNOSIS — B351 Tinea unguium: Secondary | ICD-10-CM

## 2019-01-31 DIAGNOSIS — T451X5A Adverse effect of antineoplastic and immunosuppressive drugs, initial encounter: Secondary | ICD-10-CM

## 2019-01-31 NOTE — Progress Notes (Signed)
Subjective: [name] presents today with painful, thick toenails 1-5 b/l that she cannot cut and which interfere with daily activities.  Pain is aggravated when wearing enclosed shoe gear.  [name] states she had to discontinue chemotherapy due to severe neuropathy symptoms.  She relates she still has pain in her feet due to neuropathy.   Tammy Beals, NP is her PCP.    Current Outpatient Medications:  .  acetaminophen (TYLENOL) 500 MG tablet, Take 1,000 mg by mouth every 6 (six) hours as needed (pain after Neulasta injection.)., Disp: , Rfl:  .  anastrozole (ARIMIDEX) 1 MG tablet, TAKE 1 TABLET BY MOUTH EVERY DAY, Disp: 30 tablet, Rfl: 0 .  carvedilol (COREG) 6.25 MG tablet, TAKE 1 TABLET BY MOUTH TWICE DAILY (Patient taking differently: Take 6.25 mg by mouth 2 (two) times daily with a meal. ), Disp: 180 tablet, Rfl: 1 .  ondansetron (ZOFRAN) 8 MG tablet, Take 1 tablet (8 mg total) by mouth 2 (two) times daily as needed. Start on the third day after chemotherapy., Disp: 30 tablet, Rfl: 1 .  lidocaine-prilocaine (EMLA) cream, Apply to affected area once (Patient not taking: Reported on 01/31/2019), Disp: 30 g, Rfl: 3 .  lisinopril-hydrochlorothiazide (PRINZIDE,ZESTORETIC) 20-12.5 MG per tablet, TAKE 2 TABLETS BY MOUTH DAILY (Patient not taking: No sig reported), Disp: 180 tablet, Rfl: 1 .  oxyCODONE (OXY IR/ROXICODONE) 5 MG immediate release tablet, Take 1 tablet (5 mg total) by mouth every 6 (six) hours as needed for severe pain. (Patient not taking: Reported on 01/31/2019), Disp: 12 tablet, Rfl: 0 .  pantoprazole (PROTONIX) 40 MG tablet, TAKE 1 TABLET BY MOUTH EVERY DAY (Patient not taking: Reported on 01/31/2019), Disp: 30 tablet, Rfl: 0 .  prochlorperazine (COMPAZINE) 10 MG tablet, Take 1 tablet (10 mg total) by mouth every 6 (six) hours as needed (Nausea or vomiting). (Patient not taking: Reported on 01/31/2019), Disp: 30 tablet, Rfl: 1 .  ranitidine (ZANTAC)  150 MG tablet, Take 150 mg by mouth daily as needed for heartburn., Disp: , Rfl:  No current facility-administered medications for this visit.   Facility-Administered Medications Ordered in Other Visits:  .  gadopentetate dimeglumine (MAGNEVIST) injection 20 mL, 20 mL, Intravenous, Once PRN, Sarina Ill B, MD .  gadopentetate dimeglumine (MAGNEVIST) injection 20 mL, 20 mL, Intravenous, Once PRN, Melvenia Beam, MD  No Known Allergies  Objective:  Vascular Examination: Capillary refill time immediate x 10 digits.  Dorsalis pedis and Posterior tibial pulses palpable b/l.  Digital hair present x 10 digits.  Skin temperature gradient WNL b/l.  Edema noted dorsal aspect left foot; nonpitting. No erythema, no warmth.   Dermatological Examination: Skin with normal turgor, texture and tone b/l  Toenails 1-5 b/l discolored, thick, dystrophic with subungual debris and pain with palpation to nailbeds due to thickness of nails.  Musculoskeletal: Muscle strength 5/5 to all LE muscle groups  No gross bony deformities b/l.  No pain, crepitus or joint limitation noted with ROM.   Neurological: Sensation intact with 10 gram monofilament.  Vibratory sensation intact.  Assessment: Painful onychomycosis toenails 1-5 b/l  Chemotherapy induced neuropathy  Plan: 1. Toenails 1-5 b/l were debrided in length and girth without iatrogenic bleeding. Rx written for nonformulary compounding topical antifungal: [facility]: Antifungal cream - Terbinafine 3%, Fluconazole 2%, Tea Tree Oil 5%, Urea 10%, Ibuprofen 2% in DMSO Suspension #10ml. Apply to the affected nail(s) at bedtime. 5 refills. 2. For her neuropathy symptoms, a prescription was sent to [facility] for  peripheral neuropathy cream which consists of: Bupivacaine 1%, doxepin 3%, gabapentin 6%, pentoxifylline 3%, and Topimarate 1%. Apply 1-2 grams to feet 3-4 times daily as needed for foot pain. 5 refills. 3. Patient to  continue soft, supportive shoe gear daily. 4. Patient to report any pedal injuries to medical professional immediately. 5. Follow up 3 months.  6. Patient/POA to call should there be a concern in the interim.

## 2019-02-03 ENCOUNTER — Telehealth: Payer: Self-pay | Admitting: Hematology and Oncology

## 2019-02-03 ENCOUNTER — Other Ambulatory Visit: Payer: BC Managed Care – PPO

## 2019-02-03 ENCOUNTER — Ambulatory Visit: Payer: BC Managed Care – PPO

## 2019-02-03 ENCOUNTER — Ambulatory Visit: Payer: Self-pay | Admitting: Surgery

## 2019-02-03 DIAGNOSIS — C50912 Malignant neoplasm of unspecified site of left female breast: Secondary | ICD-10-CM

## 2019-02-03 NOTE — H&P (Signed)
[name] Documented: 02/03/2019 1:51 PM Location: [facility] Patient #: [id number] DOB: 19-Jan-1961 Married / Language: English / Race: Black or African American Female  History of Present Illness [name] A. [name] MD; 02/03/2019 2:52 PM) Patient words: Patient returns for follow-up of her T3 N1 left breast cancer ID CER positive PR positive HER-2/neu negative. She has completed chemotherapy. She is ready to set up surgery. She had a repeat MRI done on health which I reviewed today. There is no report yet on this study. This will be passed on to our radiologist for review and comparison. Pulmonary examination the tumor left breast appears smaller in the previous lymphadenopathy is not nearly as obvious as before. Initial size was 10 cm x 5 cm x 3 cm. There appears to be about a 30% reduction in the size. She developed neuropathy but feels much better since chemotherapy is stopped about 3 weeks ago.  The patient is a 58 year old female.   Medication History ([name], CMA; 02/03/2019 2:07 PM) Anastrozole (1MG Tablet, Oral) Active. Lisinopril-hydroCHLOROthiazide (20-25MG Tablet, Oral) Active. Tylenol (325MG Tablet, Oral) Active. Pantoprazole Sodium (40MG Tablet DR, Oral) Active. Carvedilol (6.25MG Tablet, Oral) Active. Xanax (0.5MG Tablet, Oral) Active. Medications Reconciled    Vitals ([name] CMA; 02/03/2019 2:05 PM) 02/03/2019 2:04 PM Weight: 275.25 lb Height: 74in Body Surface Area: 2.49 m Body Mass Index: 35.34 kg/m  Temp.: 98.33F  Pulse: 119 (Regular)  P.OX: 94% (Room air) BP: 136/90 (Sitting, Left Arm, Standard)      Physical Exam ([name] A. Dearius Hoffmann MD; 02/03/2019 2:53 PM)  General Mental Status-Alert. General Appearance-Consistent with stated age. Hydration-Well hydrated. Voice-Normal.  Chest and Lung Exam Chest and lung exam reveals -quiet, even and easy respiratory effort with no use of accessory  muscles and on auscultation, normal breath sounds, no adventitious sounds and normal vocal resonance. Inspection Chest Wall - Normal. Back - normal.  Breast Breast - Left-Symmetric, Non Tender, No Biopsy scars, no Dimpling, No Inflammation, No Lumpectomy scars, No Mastectomy scars, No Peau d' Orange. Breast - Right-Symmetric, Non Tender, No Biopsy scars, no Dimpling, No Inflammation, No Lumpectomy scars, No Mastectomy scars, No Peau d' Orange. Breast Lump-No Palpable Breast Mass.  Cardiovascular Cardiovascular examination reveals -on palpation PMI is normal in location and amplitude, no palpable S3 or S4. Normal cardiac borders., normal heart sounds, regular rate and rhythm with no murmurs, carotid auscultation reveals no bruits and normal pedal pulses bilaterally.  Neurologic Neurologic evaluation reveals -alert and oriented x 3 with no impairment of recent or remote memory. Mental Status-Normal.  Lymphatic Head & Neck  General Head & Neck Lymphatics: Bilateral - Description - Normal. Axillary  General Axillary Region: Bilateral - Description - Normal. Tenderness - Non Tender.    Assessment & Plan ([name] A. [name] MD; 02/03/2019 2:56 PM)  BREAST CANCER, LEFT (C50.912) Impression: Discuss surgical options. After reviewing her MRI and her examination is think she would be a good lumpectomy candidate. She has very large breasts and certainly could tolerate a large lumpectomy specimen in the left upper outer quadrant. I discussed sentinel lymph node mapping as well as targeted lymph node biopsy of the previous left axillary node was positive. I discussed removal reports and she is finished chemotherapy as well. I discussed potential mastectomy and reconstruction. She would likely require postmastectomy radiation therapy given its size. I discussed potential reexcision lumpectomy and completion lymph node dissection depending on surgical results. She is in agreement to proceed  with lumpectomy and that's  what she desires to do which I feel is reasonable in her case. Risk of lumpectomy include bleeding, infection, seroma, more surgery, use of seed/wire, wound care, cosmetic deformity and the need for other treatments, death , blood clots, death. Pt agrees to proceed. Risk of sentinel lymph node mapping include bleeding, infection, lymphedema, shoulder pain. stiffness, dye allergy. cosmetic deformity , blood clots, death, need for more surgery. Pt agres to proceed.  Current Plans You are being scheduled for surgery- Our schedulers will call you.  You should hear from our office's scheduling department within 5 working days about the location, date, and time of surgery. We try to make accommodations for patient's preferences in scheduling surgery, but sometimes the OR schedule or the surgeon's schedule prevents [place] from making those accommodations.  If you have not heard from our office [phone]) in 5 working days, call the office and ask for your surgeon's nurse.  If you have other questions about your diagnosis, plan, or surgery, call the office and ask for your surgeon's nurse.  Pt Education - ABC (After Breast Cancer) Class Info: discussed with patient and provided information. Pt Education - [facility] Breast Pains Education Pt Education - breast cancer surgery: discussed with patient and provided information. We discussed the staging and pathophysiology of breast cancer. We discussed all of the different options for treatment for breast cancer including surgery, chemotherapy, radiation therapy, Herceptin, and antiestrogen therapy. We discussed a sentinel lymph node biopsy as she does not appear to having lymph node involvement right now. We discussed the performance of that with injection of radioactive tracer and blue dye. We discussed that she would have an incision underneath her axillary hairline. We discussed that there is a bout a 10-20% chance of having a positive  node with a sentinel lymph node biopsy and we will await the permanent pathology to make any other first further decisions in terms of her treatment. One of these options might be to return to the operating room to perform an axillary lymph node dissection. We discussed about a 1-2% risk lifetime of chronic shoulder pain as well as lymphedema associated with a sentinel lymph node biopsy. We discussed the options for treatment of the breast cancer which included lumpectomy versus a mastectomy. We discussed the performance of the lumpectomy with a wire placement. We discussed a 10-20% chance of a positive margin requiring reexcision in the operating room. We also discussed that she may need radiation therapy or antiestrogen therapy or both if she undergoes lumpectomy. We discussed the mastectomy and the postoperative care for that as well. We discussed that there is no difference in her survival whether she undergoes lumpectomy with radiation therapy or antiestrogen therapy versus a mastectomy. There is a slight difference in the local recurrence rate being 3-5% with lumpectomy and about 1% with a mastectomy. We discussed the risks of operation including bleeding, infection, possible reoperation. She understands her further therapy will be based on what her stages at the time of her operation.

## 2019-02-03 NOTE — H&P (View-Only) (Signed)
[name] Documented: 02/03/2019 1:51 PM Location: [facility] Patient #: [id number] DOB: 19-Jan-1961 Married / Language: English / Race: Black or African American Female  History of Present Illness [name]; 02/03/2019 2:52 PM) Patient words: Patient returns for follow-up of her T3 N1 left breast cancer ID CER positive PR positive HER-2/neu negative. She has completed chemotherapy. She is ready to set up surgery. She had a repeat MRI done on health which I reviewed today. There is no report yet on this study. This will be passed on to our radiologist for review and comparison. Pulmonary examination the tumor left breast appears smaller in the previous lymphadenopathy is not nearly as obvious as before. Initial size was 10 cm x 5 cm x 3 cm. There appears to be about a 30% reduction in the size. She developed neuropathy but feels much better since chemotherapy is stopped about 3 weeks ago.  The patient is a 58 year old female.   Medication History ([name], CMA; 02/03/2019 2:07 PM) Anastrozole (1MG Tablet, Oral) Active. Lisinopril-hydroCHLOROthiazide (20-25MG Tablet, Oral) Active. Tylenol (325MG Tablet, Oral) Active. Pantoprazole Sodium (40MG Tablet DR, Oral) Active. Carvedilol (6.25MG Tablet, Oral) Active. Xanax (0.5MG Tablet, Oral) Active. Medications Reconciled    Vitals ([name] CMA; 02/03/2019 2:05 PM) 02/03/2019 2:04 PM Weight: 275.25 lb Height: 74in Body Surface Area: 2.49 m Body Mass Index: 35.34 kg/m  Temp.: 98.33F  Pulse: 119 (Regular)  P.OX: 94% (Room air) BP: 136/90 (Sitting, Left Arm, Standard)      Physical Exam ([name] Hayden; 02/03/2019 2:53 PM)  General Mental Status-Alert. General Appearance-Consistent with stated age. Hydration-Well hydrated. Voice-Normal.  Chest and Lung Exam Chest and lung exam reveals -quiet, even and easy respiratory effort with no use of accessory  muscles and on auscultation, normal breath sounds, no adventitious sounds and normal vocal resonance. Inspection Chest Wall - Normal. Back - normal.  Breast Breast - Left-Symmetric, Non Tender, No Biopsy scars, no Dimpling, No Inflammation, No Lumpectomy scars, No Mastectomy scars, No Peau d' Orange. Breast - Right-Symmetric, Non Tender, No Biopsy scars, no Dimpling, No Inflammation, No Lumpectomy scars, No Mastectomy scars, No Peau d' Orange. Breast Lump-No Palpable Breast Mass.  Cardiovascular Cardiovascular examination reveals -on palpation PMI is normal in location and amplitude, no palpable S3 or S4. Normal cardiac borders., normal heart sounds, regular rate and rhythm with no murmurs, carotid auscultation reveals no bruits and normal pedal pulses bilaterally.  Neurologic Neurologic evaluation reveals -alert and oriented x 3 with no impairment of recent or remote memory. Mental Status-Normal.  Lymphatic Head & Neck  General Head & Neck Lymphatics: Bilateral - Description - Normal. Axillary  General Axillary Region: Bilateral - Description - Normal. Tenderness - Non Tender.    Assessment & Plan ([name] Hayden; 02/03/2019 2:56 PM)  BREAST CANCER, LEFT (C50.912) Impression: Discuss surgical options. After reviewing her MRI and her examination is think she would be a good lumpectomy candidate. She has very large breasts and certainly could tolerate a large lumpectomy specimen in the left upper outer quadrant. I discussed sentinel lymph node mapping as well as targeted lymph node biopsy of the previous left axillary node was positive. I discussed removal reports and she is finished chemotherapy as well. I discussed potential mastectomy and reconstruction. She would likely require postmastectomy radiation therapy given its size. I discussed potential reexcision lumpectomy and completion lymph node dissection depending on surgical results. She is in agreement to proceed  with lumpectomy and that's  what she desires to do which I feel is reasonable in her case. Risk of lumpectomy include bleeding, infection, seroma, more surgery, use of seed/wire, wound care, cosmetic deformity and the need for other treatments, death , blood clots, death. Pt agrees to proceed. Risk of sentinel lymph node mapping include bleeding, infection, lymphedema, shoulder pain. stiffness, dye allergy. cosmetic deformity , blood clots, death, need for more surgery. Pt agres to proceed.  Current Plans You are being scheduled for surgery- Our schedulers will call you.  You should hear from our office's scheduling department within 5 working days about the location, date, and time of surgery. We try to make accommodations for patient's preferences in scheduling surgery, but sometimes the OR schedule or the surgeon's schedule prevents [place] from making those accommodations.  If you have not heard from our office [phone]) in 5 working days, call the office and ask for your surgeon's nurse.  If you have other questions about your diagnosis, plan, or surgery, call the office and ask for your surgeon's nurse.  Pt Education - ABC (After Breast Cancer) Class Info: discussed with patient and provided information. Pt Education - [facility] Breast Pains Education Pt Education - breast cancer surgery: discussed with patient and provided information. We discussed the staging and pathophysiology of breast cancer. We discussed all of the different options for treatment for breast cancer including surgery, chemotherapy, radiation therapy, Herceptin, and antiestrogen therapy. We discussed a sentinel lymph node biopsy as she does not appear to having lymph node involvement right now. We discussed the performance of that with injection of radioactive tracer and blue dye. We discussed that she would have an incision underneath her axillary hairline. We discussed that there is a bout a 10-20% chance of having a positive  node with a sentinel lymph node biopsy and we will await the permanent pathology to make any other first further decisions in terms of her treatment. One of these options might be to return to the operating room to perform an axillary lymph node dissection. We discussed about a 1-2% risk lifetime of chronic shoulder pain as well as lymphedema associated with a sentinel lymph node biopsy. We discussed the options for treatment of the breast cancer which included lumpectomy versus a mastectomy. We discussed the performance of the lumpectomy with a wire placement. We discussed a 10-20% chance of a positive margin requiring reexcision in the operating room. We also discussed that she may need radiation therapy or antiestrogen therapy or both if she undergoes lumpectomy. We discussed the mastectomy and the postoperative care for that as well. We discussed that there is no difference in her survival whether she undergoes lumpectomy with radiation therapy or antiestrogen therapy versus a mastectomy. There is a slight difference in the local recurrence rate being 3-5% with lumpectomy and about 1% with a mastectomy. We discussed the risks of operation including bleeding, infection, possible reoperation. She understands her further therapy will be based on what her stages at the time of her operation.

## 2019-02-03 NOTE — Telephone Encounter (Signed)
Called regarding upcoming Web[facility] appointment, patient would prefer this to be a telephone visit.  °

## 2019-02-04 ENCOUNTER — Encounter: Payer: Self-pay | Admitting: *Deleted

## 2019-02-04 DIAGNOSIS — Z170 Estrogen receptor positive status [ER+]: Principal | ICD-10-CM

## 2019-02-04 DIAGNOSIS — C50412 Malignant neoplasm of upper-outer quadrant of left female breast: Secondary | ICD-10-CM

## 2019-02-06 ENCOUNTER — Other Ambulatory Visit: Payer: Self-pay | Admitting: Hematology and Oncology

## 2019-02-07 ENCOUNTER — Other Ambulatory Visit: Payer: BC Managed Care – PPO

## 2019-02-07 ENCOUNTER — Telehealth: Payer: Self-pay | Admitting: Hematology and Oncology

## 2019-02-07 NOTE — Telephone Encounter (Signed)
Rescheduled 4/28 appt to 5/20 per sch msg. Called and spoke with patient. Confirmed date and time

## 2019-02-08 ENCOUNTER — Inpatient Hospital Stay: Payer: BC Managed Care – PPO | Admitting: Hematology and Oncology

## 2019-02-10 ENCOUNTER — Other Ambulatory Visit: Payer: BC Managed Care – PPO

## 2019-02-10 ENCOUNTER — Ambulatory Visit: Payer: BC Managed Care – PPO

## 2019-02-10 ENCOUNTER — Ambulatory Visit: Payer: BC Managed Care – PPO | Admitting: Hematology and Oncology

## 2019-02-14 ENCOUNTER — Telehealth: Payer: Self-pay | Admitting: *Deleted

## 2019-02-14 NOTE — Telephone Encounter (Signed)
Medical records faxed to patient; [place] [phone]

## 2019-02-15 ENCOUNTER — Encounter (HOSPITAL_BASED_OUTPATIENT_CLINIC_OR_DEPARTMENT_OTHER): Payer: Self-pay | Admitting: *Deleted

## 2019-02-15 ENCOUNTER — Other Ambulatory Visit: Payer: Self-pay

## 2019-02-16 ENCOUNTER — Encounter (HOSPITAL_BASED_OUTPATIENT_CLINIC_OR_DEPARTMENT_OTHER)
Admission: RE | Admit: 2019-02-16 | Discharge: 2019-02-16 | Disposition: A | Discharge status: Home / Self Care | Payer: BC Managed Care – PPO | Source: Ambulatory Visit | Attending: Surgery | Admitting: Surgery

## 2019-02-16 DIAGNOSIS — C50912 Malignant neoplasm of unspecified site of left female breast: Secondary | ICD-10-CM | POA: Diagnosis not present

## 2019-02-16 DIAGNOSIS — Z01812 Encounter for preprocedural laboratory examination: Secondary | ICD-10-CM | POA: Insufficient documentation

## 2019-02-16 LAB — CBC WITH DIFFERENTIAL/PLATELET
Abs Immature Granulocytes: 0.01 10*3/uL (ref 0.00–0.07)
Basophils Absolute: 0 10*3/uL (ref 0.0–0.1)
Basophils Relative: 1 %
Eosinophils Absolute: 0.1 10*3/uL (ref 0.0–0.5)
Eosinophils Relative: 2 %
HCT: 31.5 % — ABNORMAL LOW (ref 36.0–46.0)
Hemoglobin: 10.3 g/dL — ABNORMAL LOW (ref 12.0–15.0)
Immature Granulocytes: 0 %
Lymphocytes Relative: 19 %
Lymphs Abs: 0.8 10*3/uL (ref 0.7–4.0)
MCH: 29.5 pg (ref 26.0–34.0)
MCHC: 32.7 g/dL (ref 30.0–36.0)
MCV: 90.3 fL (ref 80.0–100.0)
Monocytes Absolute: 0.5 10*3/uL (ref 0.1–1.0)
Monocytes Relative: 11 %
Neutro Abs: 2.8 10*3/uL (ref 1.7–7.7)
Neutrophils Relative %: 67 %
Platelets: 180 10*3/uL (ref 150–400)
RBC: 3.49 MIL/uL — ABNORMAL LOW (ref 3.87–5.11)
RDW: 13.3 % (ref 11.5–15.5)
WBC: 4.2 10*3/uL (ref 4.0–10.5)
nRBC: 0 % (ref 0.0–0.2)

## 2019-02-16 LAB — COMPREHENSIVE METABOLIC PANEL
ALT: 15 U/L (ref 0–44)
AST: 17 U/L (ref 15–41)
Albumin: 3.5 g/dL (ref 3.5–5.0)
Alkaline Phosphatase: 63 U/L (ref 38–126)
Anion gap: 11 (ref 5–15)
BUN: 11 mg/dL (ref 6–20)
CO2: 26 mmol/L (ref 22–32)
Calcium: 9.5 mg/dL (ref 8.9–10.3)
Chloride: 104 mmol/L (ref 98–111)
Creatinine, Ser: 0.85 mg/dL (ref 0.44–1.00)
GFR calc Af Amer: >60 mL/min (ref >60)
GFR calc non Af Amer: >60 mL/min (ref >60)
Glucose, Bld: 103 mg/dL — ABNORMAL HIGH (ref 70–99)
Potassium: 3.3 mmol/L — ABNORMAL LOW (ref 3.5–5.1)
Sodium: 141 mmol/L (ref 135–145)
Total Bilirubin: 0.4 mg/dL (ref 0.3–1.2)
Total Protein: 6.1 g/dL — ABNORMAL LOW (ref 6.5–8.1)

## 2019-02-16 NOTE — Progress Notes (Signed)
Ensure pre surgery drink given with instructions to complete by [place], pt verbalized understanding.

## 2019-02-17 ENCOUNTER — Other Ambulatory Visit: Payer: BC Managed Care – PPO

## 2019-02-17 ENCOUNTER — Ambulatory Visit: Payer: BC Managed Care – PPO

## 2019-02-18 ENCOUNTER — Other Ambulatory Visit: Payer: Self-pay

## 2019-02-18 ENCOUNTER — Other Ambulatory Visit (HOSPITAL_COMMUNITY)
Admission: RE | Admit: 2019-02-18 | Discharge: 2019-02-18 | Disposition: A | Discharge status: Home / Self Care | Payer: BC Managed Care – PPO | Source: Ambulatory Visit | Attending: Surgery | Admitting: Surgery

## 2019-02-18 DIAGNOSIS — Z1159 Encounter for screening for other viral diseases: Secondary | ICD-10-CM | POA: Diagnosis not present

## 2019-02-20 LAB — NOVEL CORONAVIRUS, NAA (HOSP ORDER, SEND-OUT TO REF LAB; TAT 18-24 HRS): SARS-CoV-2, NAA: NOT DETECTED

## 2019-02-22 ENCOUNTER — Ambulatory Visit (HOSPITAL_BASED_OUTPATIENT_CLINIC_OR_DEPARTMENT_OTHER): Payer: BC Managed Care – PPO | Admitting: Anesthesiology

## 2019-02-22 ENCOUNTER — Ambulatory Visit (HOSPITAL_BASED_OUTPATIENT_CLINIC_OR_DEPARTMENT_OTHER)
Admission: RE | Admit: 2019-02-22 | Discharge: 2019-02-22 | Disposition: A | Discharge status: Home / Self Care | Payer: BC Managed Care – PPO | Attending: Surgery | Admitting: Surgery

## 2019-02-22 ENCOUNTER — Other Ambulatory Visit: Payer: Self-pay

## 2019-02-22 ENCOUNTER — Encounter (HOSPITAL_BASED_OUTPATIENT_CLINIC_OR_DEPARTMENT_OTHER)
Admission: RE | Disposition: A | Discharge status: Home / Self Care | Payer: Self-pay | Source: Home / Self Care | Attending: Surgery

## 2019-02-22 ENCOUNTER — Encounter (HOSPITAL_BASED_OUTPATIENT_CLINIC_OR_DEPARTMENT_OTHER): Payer: Self-pay | Admitting: *Deleted

## 2019-02-22 ENCOUNTER — Ambulatory Visit (HOSPITAL_COMMUNITY)
Admission: RE | Admit: 2019-02-22 | Discharge: 2019-02-22 | Disposition: A | Discharge status: Home / Self Care | Payer: BC Managed Care – PPO | Source: Ambulatory Visit | Attending: Surgery | Admitting: Surgery

## 2019-02-22 DIAGNOSIS — I10 Essential (primary) hypertension: Secondary | ICD-10-CM | POA: Insufficient documentation

## 2019-02-22 DIAGNOSIS — C773 Secondary and unspecified malignant neoplasm of axilla and upper limb lymph nodes: Secondary | ICD-10-CM | POA: Insufficient documentation

## 2019-02-22 DIAGNOSIS — Z9221 Personal history of antineoplastic chemotherapy: Secondary | ICD-10-CM | POA: Insufficient documentation

## 2019-02-22 DIAGNOSIS — Z170 Estrogen receptor positive status [ER+]: Secondary | ICD-10-CM | POA: Diagnosis not present

## 2019-02-22 DIAGNOSIS — Z79899 Other long term (current) drug therapy: Secondary | ICD-10-CM | POA: Diagnosis not present

## 2019-02-22 DIAGNOSIS — C50912 Malignant neoplasm of unspecified site of left female breast: Secondary | ICD-10-CM

## 2019-02-22 DIAGNOSIS — K219 Gastro-esophageal reflux disease without esophagitis: Secondary | ICD-10-CM | POA: Insufficient documentation

## 2019-02-22 HISTORY — PX: PORT-A-CATH REMOVAL: SHX5289

## 2019-02-22 HISTORY — PX: BREAST LUMPECTOMY WITH RADIOACTIVE SEED AND SENTINEL LYMPH NODE BIOPSY: SHX6550

## 2019-02-22 SURGERY — BREAST LUMPECTOMY WITH RADIOACTIVE SEED AND SENTINEL LYMPH NODE BIOPSY
Anesthesia: General | Site: Chest

## 2019-02-22 MED ORDER — ONDANSETRON HCL 4 MG/2ML IJ SOLN: 4.0000 mg | Freq: Once | INTRAMUSCULAR | Status: DC | PRN

## 2019-02-22 MED ORDER — FENTANYL CITRATE (PF) 100 MCG/2ML IJ SOLN
50.0000 ug | INTRAMUSCULAR | Status: DC | PRN
  Administered 2019-02-22: 100 ug via INTRAVENOUS

## 2019-02-22 MED ORDER — SODIUM CHLORIDE (PF) 0.9 % IJ SOLN
INTRAVENOUS | Status: DC | PRN
  Administered 2019-02-22: 5 mL via INTRADERMAL

## 2019-02-22 MED ORDER — PHENYLEPHRINE HCL (PRESSORS) 10 MG/ML IV SOLN
INTRAVENOUS | Status: DC | PRN
  Administered 2019-02-22: 40 ug via INTRAVENOUS

## 2019-02-22 MED ORDER — DEXTROSE 5 % IV SOLN
3.0000 g | INTRAVENOUS | Status: AC
  Administered 2019-02-22: 3 g via INTRAVENOUS

## 2019-02-22 MED ORDER — MIDAZOLAM HCL 2 MG/2ML IJ SOLN
INTRAMUSCULAR | Status: AC
  Filled 2019-02-22: qty 2

## 2019-02-22 MED ORDER — PROPOFOL 500 MG/50ML IV EMUL
INTRAVENOUS | Status: DC | PRN
  Administered 2019-02-22: 25 ug/kg/min via INTRAVENOUS

## 2019-02-22 MED ORDER — FENTANYL CITRATE (PF) 100 MCG/2ML IJ SOLN
INTRAMUSCULAR | Status: AC
  Filled 2019-02-22: qty 2

## 2019-02-22 MED ORDER — GABAPENTIN 300 MG PO CAPS
300.0000 mg | ORAL_CAPSULE | ORAL | Status: AC
  Administered 2019-02-22: 300 mg via ORAL

## 2019-02-22 MED ORDER — EPHEDRINE SULFATE 50 MG/ML IJ SOLN
INTRAMUSCULAR | Status: DC | PRN
  Administered 2019-02-22 (×2): 15 mg via INTRAVENOUS

## 2019-02-22 MED ORDER — ONDANSETRON HCL 4 MG/2ML IJ SOLN
INTRAMUSCULAR | Status: AC
  Filled 2019-02-22: qty 2

## 2019-02-22 MED ORDER — SCOPOLAMINE 1 MG/3DAYS TD PT72
1.0000 | MEDICATED_PATCH | Freq: Once | TRANSDERMAL | Status: DC | PRN
  Administered 2019-02-22: 09:00:00 1.5 mg via TRANSDERMAL

## 2019-02-22 MED ORDER — SCOPOLAMINE 1 MG/3DAYS TD PT72
MEDICATED_PATCH | TRANSDERMAL | Status: AC
  Filled 2019-02-22: qty 1

## 2019-02-22 MED ORDER — LACTATED RINGERS IV SOLN
INTRAVENOUS | Status: DC
  Administered 2019-02-22: 09:00:00 via INTRAVENOUS

## 2019-02-22 MED ORDER — ROPIVACAINE HCL 5 MG/ML IJ SOLN
INTRAMUSCULAR | Status: DC | PRN
  Administered 2019-02-22: 30 mL via PERINEURAL

## 2019-02-22 MED ORDER — PROPOFOL 10 MG/ML IV BOLUS
INTRAVENOUS | Status: DC | PRN
  Administered 2019-02-22: 200 mg via INTRAVENOUS

## 2019-02-22 MED ORDER — ACETAMINOPHEN 500 MG PO TABS
1000.0000 mg | ORAL_TABLET | ORAL | Status: AC
  Administered 2019-02-22: 1000 mg via ORAL

## 2019-02-22 MED ORDER — GABAPENTIN 300 MG PO CAPS
ORAL_CAPSULE | ORAL | Status: AC
  Filled 2019-02-22: qty 1

## 2019-02-22 MED ORDER — EPHEDRINE 5 MG/ML INJ
INTRAVENOUS | Status: AC
  Filled 2019-02-22: qty 10

## 2019-02-22 MED ORDER — CHLORHEXIDINE GLUCONATE CLOTH 2 % EX PADS: 6.0000 | MEDICATED_PAD | Freq: Once | CUTANEOUS | Status: DC

## 2019-02-22 MED ORDER — ACETAMINOPHEN 500 MG PO TABS
ORAL_TABLET | ORAL | Status: AC
  Filled 2019-02-22: qty 2

## 2019-02-22 MED ORDER — DEXAMETHASONE SODIUM PHOSPHATE 4 MG/ML IJ SOLN
INTRAMUSCULAR | Status: DC | PRN
  Administered 2019-02-22: 5 mg via INTRAVENOUS

## 2019-02-22 MED ORDER — LIDOCAINE 2% (20 MG/ML) 5 ML SYRINGE
INTRAMUSCULAR | Status: AC
  Filled 2019-02-22: qty 5

## 2019-02-22 MED ORDER — PROPOFOL 500 MG/50ML IV EMUL
INTRAVENOUS | Status: AC
  Filled 2019-02-22: qty 50

## 2019-02-22 MED ORDER — BUPIVACAINE-EPINEPHRINE (PF) 0.25% -1:200000 IJ SOLN
INTRAMUSCULAR | Status: DC | PRN
  Administered 2019-02-22: 17 mL

## 2019-02-22 MED ORDER — MIDAZOLAM HCL 2 MG/2ML IJ SOLN
1.0000 mg | INTRAMUSCULAR | Status: DC | PRN
  Administered 2019-02-22: 1 mg via INTRAVENOUS
  Administered 2019-02-22: 2 mg via INTRAVENOUS

## 2019-02-22 MED ORDER — TECHNETIUM TC 99M SULFUR COLLOID FILTERED
1.0000 | Freq: Once | INTRAVENOUS | Status: AC | PRN
  Administered 2019-02-22: 1 via INTRADERMAL

## 2019-02-22 MED ORDER — FENTANYL CITRATE (PF) 100 MCG/2ML IJ SOLN
25.0000 ug | INTRAMUSCULAR | Status: DC | PRN
  Administered 2019-02-22: 50 ug via INTRAVENOUS

## 2019-02-22 MED ORDER — OXYCODONE HCL 5 MG PO TABS: 5.0000 mg | ORAL_TABLET | Freq: Once | ORAL | Status: DC | PRN

## 2019-02-22 MED ORDER — SUCCINYLCHOLINE CHLORIDE 200 MG/10ML IV SOSY
PREFILLED_SYRINGE | INTRAVENOUS | Status: AC
  Filled 2019-02-22: qty 10

## 2019-02-22 MED ORDER — PHENYLEPHRINE 40 MCG/ML (10ML) SYRINGE FOR IV PUSH (FOR BLOOD PRESSURE SUPPORT)
PREFILLED_SYRINGE | INTRAVENOUS | Status: AC
  Filled 2019-02-22: qty 10

## 2019-02-22 MED ORDER — DEXAMETHASONE SODIUM PHOSPHATE 10 MG/ML IJ SOLN
INTRAMUSCULAR | Status: AC
  Filled 2019-02-22: qty 1

## 2019-02-22 MED ORDER — LIDOCAINE HCL (CARDIAC) PF 100 MG/5ML IV SOSY
PREFILLED_SYRINGE | INTRAVENOUS | Status: DC | PRN
  Administered 2019-02-22: 50 mg via INTRAVENOUS

## 2019-02-22 MED ORDER — OXYCODONE HCL 5 MG PO TABS: 5.0000 mg | ORAL_TABLET | Freq: Four times a day (QID) | ORAL | 0 refills | Status: DC | PRN

## 2019-02-22 MED ORDER — CEFAZOLIN SODIUM-DEXTROSE 2-4 GM/100ML-% IV SOLN
INTRAVENOUS | Status: AC
  Filled 2019-02-22: qty 100

## 2019-02-22 MED ORDER — OXYCODONE HCL 5 MG/5ML PO SOLN: 5.0000 mg | Freq: Once | ORAL | Status: DC | PRN

## 2019-02-22 SURGICAL SUPPLY — 54 items
APL SKNCLS STERI-STRIP NONHPOA (GAUZE/BANDAGES/DRESSINGS)
APPLIER CLIP 9.375 MED OPEN (MISCELLANEOUS) ×4
BENZOIN TINCTURE PRP APPL 2/3 (GAUZE/BANDAGES/DRESSINGS) IMPLANT
BINDER BREAST 3XL (GAUZE/BANDAGES/DRESSINGS) ×4 IMPLANT
BINDER BREAST LRG (GAUZE/BANDAGES/DRESSINGS) IMPLANT
BINDER BREAST MEDIUM (GAUZE/BANDAGES/DRESSINGS) IMPLANT
BINDER BREAST XLRG (GAUZE/BANDAGES/DRESSINGS) IMPLANT
BINDER BREAST XXLRG (GAUZE/BANDAGES/DRESSINGS) IMPLANT
BLADE SURG 15 STRL LF DISP TIS (BLADE) ×4 IMPLANT
BLADE SURG 15 STRL SS (BLADE) ×4
CANISTER SUC SOCK COL 7IN (MISCELLANEOUS) IMPLANT
CANISTER SUCT 1200ML W/VALVE (MISCELLANEOUS) ×4 IMPLANT
CHLORAPREP W/TINT 26 (MISCELLANEOUS) ×4 IMPLANT
CLIP APPLIE 9.375 MED OPEN (MISCELLANEOUS) ×2 IMPLANT
CLOSURE WOUND 1/2 X4 (GAUZE/BANDAGES/DRESSINGS)
COVER BACK TABLE REUSABLE LG (DRAPES) ×4 IMPLANT
COVER MAYO STAND REUSABLE (DRAPES) ×4 IMPLANT
COVER PROBE W GEL 5X96 (DRAPES) ×4 IMPLANT
COVER WAND RF STERILE (DRAPES) IMPLANT
DECANTER SPIKE VIAL GLASS SM (MISCELLANEOUS) ×4 IMPLANT
DERMABOND ADVANCED (GAUZE/BANDAGES/DRESSINGS) ×2
DERMABOND ADVANCED .7 DNX12 (GAUZE/BANDAGES/DRESSINGS) ×2 IMPLANT
DRAPE LAPAROSCOPIC ABDOMINAL (DRAPES) ×4 IMPLANT
DRAPE LAPAROTOMY 100X72 PEDS (DRAPES) IMPLANT
DRAPE UTILITY XL STRL (DRAPES) ×4 IMPLANT
ELECT COATED BLADE 2.86 ST (ELECTRODE) ×4 IMPLANT
ELECT REM PT RETURN 9FT ADLT (ELECTROSURGICAL) ×4
ELECTRODE REM PT RTRN 9FT ADLT (ELECTROSURGICAL) ×2 IMPLANT
GLOVE BIOGEL PI IND STRL 8 (GLOVE) ×2 IMPLANT
GLOVE BIOGEL PI INDICATOR 8 (GLOVE) ×2
GLOVE ECLIPSE 8.0 STRL XLNG CF (GLOVE) ×4 IMPLANT
GOWN STRL REUS W/ TWL LRG LVL3 (GOWN DISPOSABLE) ×4 IMPLANT
GOWN STRL REUS W/TWL LRG LVL3 (GOWN DISPOSABLE) ×4
HEMOSTAT ARISTA ABSORB 3G PWDR (HEMOSTASIS) IMPLANT
HEMOSTAT SNOW SURGICEL 2X4 (HEMOSTASIS) IMPLANT
KIT MARKER MARGIN INK (KITS) ×4 IMPLANT
NDL SAFETY ECLIPSE 18X1.5 (NEEDLE) IMPLANT
NEEDLE HYPO 18GX1.5 SHARP (NEEDLE)
NEEDLE HYPO 25X1 1.5 SAFETY (NEEDLE) ×8 IMPLANT
NS IRRIG 1000ML POUR BTL (IV SOLUTION) ×4 IMPLANT
PACK BASIN DAY SURGERY FS (CUSTOM PROCEDURE TRAY) ×4 IMPLANT
PENCIL BUTTON HOLSTER BLD 10FT (ELECTRODE) ×4 IMPLANT
SLEEVE SCD COMPRESS KNEE MED (MISCELLANEOUS) ×4 IMPLANT
SPONGE LAP 4X18 RFD (DISPOSABLE) ×4 IMPLANT
STRIP CLOSURE SKIN 1/2X4 (GAUZE/BANDAGES/DRESSINGS) IMPLANT
SUT MNCRL AB 4-0 PS2 18 (SUTURE) ×8 IMPLANT
SUT MON AB 4-0 PC3 18 (SUTURE) ×4 IMPLANT
SUT VICRYL 3-0 CR8 SH (SUTURE) ×8 IMPLANT
SYR CONTROL 10ML LL (SYRINGE) ×8 IMPLANT
TOWEL GREEN STERILE FF (TOWEL DISPOSABLE) ×4 IMPLANT
TRAY FAXITRON CT DISP (TRAY / TRAY PROCEDURE) ×4 IMPLANT
TUBE CONNECTING 20'X1/4 (TUBING) ×1
TUBE CONNECTING 20X1/4 (TUBING) ×3 IMPLANT
YANKAUER SUCT BULB TIP NO VENT (SUCTIONS) ×4 IMPLANT

## 2019-02-22 NOTE — Anesthesia Postprocedure Evaluation (Signed)
Anesthesia Post Note  Patient: [name]  Procedure(s) Performed: LEFT BREAST LUMPECTOMY x2 WITH RADIOACTIVE SEED AND LEFT AXILLA SEED  GUIDED LYMPH NODE BIOPSY AND LEFT SENTINEL LYMPH NODE MAPPING (Left Breast) REMOVAL PORT-A-CATH (N/A Chest)     Patient location during evaluation: PACU Anesthesia Type: General Level of consciousness: awake and alert Pain management: pain level controlled Vital Signs Assessment: post-procedure vital signs reviewed and stable Respiratory status: spontaneous breathing, nonlabored ventilation and respiratory function stable Cardiovascular status: blood pressure returned to baseline and stable Postop Assessment: no apparent nausea or vomiting Anesthetic complications: no    Last Vitals:  Vitals:   02/22/19 1230 02/22/19 1245  BP: (!) 120/91 125/86  Pulse: 72 73  Resp: 15 18  Temp:    SpO2: 98% 100%    Last Pain:  Vitals:   02/22/19 1245  TempSrc:   PainSc: 3                  Lidia Collum

## 2019-02-22 NOTE — Discharge Instructions (Signed)
[facility] °Office Phone Number [phone] ° °BREAST BIOPSY/ PARTIAL MASTECTOMY: POST OP INSTRUCTIONS ° °Always review your discharge instruction sheet given to you by the facility where your surgery was performed. ° °IF YOU HAVE DISABILITY OR FAMILY LEAVE FORMS, YOU MUST BRING THEM TO THE OFFICE FOR PROCESSING.  DO NOT GIVE THEM TO YOUR DOCTOR. ° °1. A prescription for pain medication may be given to you upon discharge.  Take your pain medication as prescribed, if needed.  If narcotic pain medicine is not needed, then you may take acetaminophen (Tylenol) or ibuprofen (Advil) as needed. °2. Take your usually prescribed medications unless otherwise directed °3. If you need a refill on your pain medication, please contact your pharmacy.  They will contact our office to request authorization.  Prescriptions will not be filled after 5pm or on week-ends. °4. You should eat very light the first 24 hours after surgery, such as soup, crackers, pudding, etc.  Resume your normal diet the day after surgery. °5. Most patients will experience some swelling and bruising in the breast.  Ice packs and a good support bra will help.  Swelling and bruising can take several days to resolve.  °6. It is common to experience some constipation if taking pain medication after surgery.  Increasing fluid intake and taking a stool softener will usually help or prevent this problem from occurring.  A mild laxative (Milk of Magnesia or Miralax) should be taken according to package directions if there are no bowel movements after 48 hours. °7. Unless discharge instructions indicate otherwise, you may remove your bandages 24-48 hours after surgery, and you may shower at that time.  You may have steri-strips (small skin tapes) in place directly over the incision.  These strips should be left on the skin for 7-10 days.  If your surgeon used skin glue on the incision, you may shower in 24 hours.  The glue will flake off over the  next 2-3 weeks.  Any sutures or staples will be removed at the office during your follow-up visit. °8. ACTIVITIES:  You may resume regular daily activities (gradually increasing) beginning the next day.  Wearing a good support bra or sports bra minimizes pain and swelling.  You may have sexual intercourse when it is comfortable. °a. You may drive when you no longer are taking prescription pain medication, you can comfortably wear a seatbelt, and you can safely maneuver your car and apply brakes. °b. RETURN TO WORK:  ______________________________________________________________________________________ °9. You should see your doctor in the office for a follow-up appointment approximately two weeks after your surgery.  Your doctor’s nurse will typically make your follow-up appointment when she calls you with your pathology report.  Expect your pathology report 2-3 business days after your surgery.  You may call to check if you do not hear from us after three days. °10. OTHER INSTRUCTIONS: _______________________________________________________________________________________________ _____________________________________________________________________________________________________________________________________ °_____________________________________________________________________________________________________________________________________ °_____________________________________________________________________________________________________________________________________ ° °WHEN TO CALL YOUR DOCTOR: °1. Fever over 101.0 °2. Nausea and/or vomiting. °3. Extreme swelling or bruising. °4. Continued bleeding from incision. °5. Increased pain, redness, or drainage from the incision. ° °The clinic staff is available to answer your questions during regular business hours.  Please don’t hesitate to call and ask to speak to one of the nurses for clinical concerns.  If you have a medical emergency, go to the nearest  emergency room or call 911.  A surgeon from [facility] is always on call at the hospital. ° °For further questions, please visit [email or web address]  ° ° ° ° °  Post Anesthesia Home Care Instructions ° °Activity: °Get plenty of rest for the remainder of the day. A responsible individual must stay with you for 24 hours following the procedure.  °For the next 24 hours, DO NOT: °-Drive a car °-Operate machinery °-Drink alcoholic beverages °-Take any medication unless instructed by your physician °-Make any legal decisions or sign important papers. ° °Meals: °Start with liquid foods such as gelatin or soup. Progress to regular foods as tolerated. Avoid greasy, spicy, heavy foods. If nausea and/or vomiting occur, drink only clear liquids until the nausea and/or vomiting subsides. Call your physician if vomiting continues. ° °Special Instructions/Symptoms: °Your throat may feel dry or sore from the anesthesia or the breathing tube placed in your throat during surgery. If this causes discomfort, gargle with warm salt water. The discomfort should disappear within 24 hours. ° °If you had a scopolamine patch placed behind your ear for the management of post- operative nausea and/or vomiting: ° °1. The medication in the patch is effective for 72 hours, after which it should be removed.  Wrap patch in a tissue and discard in the trash. Wash hands thoroughly with soap and water. °2. You may remove the patch earlier than 72 hours if you experience unpleasant side effects which may include dry mouth, dizziness or visual disturbances. °3. Avoid touching the patch. Wash your hands with soap and water after contact with the patch. °  ° °

## 2019-02-22 NOTE — Transfer of Care (Signed)
Immediate Anesthesia Transfer of Care Note  Patient: [name]  Procedure(s) Performed: LEFT BREAST LUMPECTOMY x2 WITH RADIOACTIVE SEED AND LEFT AXILLA SEED  GUIDED LYMPH NODE BIOPSY AND LEFT SENTINEL LYMPH NODE MAPPING (Left Breast) REMOVAL PORT-A-CATH (N/A Chest)  Patient Location: PACU  Anesthesia Type:GA combined with regional for post-op pain  Level of Consciousness: sedated  Airway & Oxygen Therapy: Patient Spontanous Breathing and Patient connected to nasal cannula oxygen  Post-op Assessment: Report given to RN and Post -op Vital signs reviewed and stable  Post vital signs: Reviewed and stable  Last Vitals:  Vitals Value Taken Time  BP 112/83 02/22/2019 11:33 AM  Temp    Pulse 94 02/22/2019 11:35 AM  Resp 11 02/22/2019 11:35 AM  SpO2 100 % 02/22/2019 11:35 AM  Vitals shown include unvalidated device data.  Last Pain:  Vitals:   02/22/19 0834  TempSrc: Oral  PainSc: 0-No pain         Complications: No apparent anesthesia complications

## 2019-02-22 NOTE — Anesthesia Procedure Notes (Signed)
Procedure Name: LMA Insertion Date/Time: 02/22/2019 9:59 AM Performed by: Willa Frater, CRNA Pre-anesthesia Checklist: Patient identified, Emergency Drugs available, Suction available and Patient being monitored Patient Re-evaluated:Patient Re-evaluated prior to induction Oxygen Delivery Method: Circle system utilized Preoxygenation: Pre-oxygenation with 100% oxygen Induction Type: IV induction Ventilation: Mask ventilation without difficulty LMA: LMA inserted LMA Size: 4.0 Number of attempts: 1 Airway Equipment and Method: Bite block Placement Confirmation: positive ETCO2 Tube secured with: Tape Dental Injury: Teeth and Oropharynx as per pre-operative assessment

## 2019-02-22 NOTE — Anesthesia Preprocedure Evaluation (Addendum)
Anesthesia Evaluation  Patient identified by MRN, date of birth, ID band Patient awake    Reviewed: Allergy & Precautions, NPO status , Patient's Chart, lab work & pertinent test results, reviewed documented beta blocker date and time   History of Anesthesia Complications (+) PONVNegative for: history of anesthetic complications  Airway Mallampati: II  TM Distance: >3 FB Neck ROM: Full    Dental no notable dental hx.    Pulmonary neg pulmonary ROS,    Pulmonary exam normal        Cardiovascular hypertension, Pt. on medications and Pt. on home beta blockers Normal cardiovascular exam     Neuro/Psych negative neurological ROS  negative psych ROS   GI/Hepatic Neg liver ROS, GERD  ,  Endo/Other  negative endocrine ROS  Renal/GU negative Renal ROS  negative genitourinary   Musculoskeletal negative musculoskeletal ROS (+)   Abdominal   Peds  Hematology negative hematology ROS (+)   Anesthesia Other Findings Unremarkable echo 10/2018 COVID negative 02/18/19  Reproductive/Obstetrics                            Anesthesia Physical Anesthesia Plan  ASA: II  Anesthesia Plan: General   Post-op Pain Management: GA combined w/ Regional for post-op pain   Induction: Intravenous  PONV Risk Score and Plan: 4 or greater and Ondansetron, Dexamethasone, Midazolam and Treatment may vary due to age or medical condition  Airway Management Planned: LMA  Additional Equipment: None  Intra-op Plan:   Post-operative Plan: Extubation in OR  Informed Consent: I have reviewed the patients History and Physical, chart, labs and discussed the procedure including the risks, benefits and alternatives for the proposed anesthesia with the patient or authorized representative who has indicated his/her understanding and acceptance.     Dental advisory given  Plan Discussed with:   Anesthesia Plan Comments:         Anesthesia Quick Evaluation

## 2019-02-22 NOTE — Op Note (Signed)
Preoperative diagnosis: Stage II left breast cancer multifocal  Postoperative diagnosis: Same  Procedure: Left breast seed lumpectomy x2 with targeted left axillary seed localized lymph node biopsy and left axillary sentinel lymph node mapping with methylene blue dye of deep axillary lymph node basin and port removal  Surgeon: Erroll Luna, MD  Anesthesia: LMA with pectoral block and local anesthetic  EBL: 20 cc  Specimen: Left breast masses x2 contained in the same specimen with seed and clip verified by Faxitron and the specimen at both sites and seed localized left axillary lymph node sent as targeted node with 3 sentinel lymph nodes hot and blue.  The targeted node was also hot.  EBL: 30 cc  Drains: None  IV fluids: Per anesthesia record  Indications for procedure: The patient presents after neoadjuvant chemotherapy for locally advanced multifocal left breast cancer with lymph node involvement.  Her MRI showed a very good response and she opted for lumpectomy.  There are 2 areas in the left breast upper outer quadrant that were marked by seeds and clips preoperatively.  Also a seed was placed next to a positive lymph node prior to chemotherapy.  MRI showed resolution of most of her disease.  She opted for breast conservation over mastectomy.  Risk, benefits and other options of surgery and treatments were discussed with the patient.The procedure has been discussed with the patient. Alternatives to surgery have been discussed with the patient.  Risks of surgery include bleeding,  Infection,  Seroma formation, death,  and the need for further surgery.   The patient understands and wishes to proceed.Sentinel lymph node mapping and dissection has been discussed with the patient.  Risk of bleeding,  Infection,  Seroma formation,  Additional procedures,,  Shoulder weakness ,  Shoulder stiffness,  Nerve and blood vessel injury and reaction to the mapping dyes have been discussed.  Alternatives to  surgery have been discussed with the patient.  The patient agrees to proceed.     Description of procedure: The patient was met in the holding area.  Neoprobe was used to verify seed locations x3.  Left breast was marked as correct side.  Questions were answered.  She underwent a pectoral block and injection of left breast with technetium sulfur colloid per protocol.  She was then taken back the operative room.  She is placed supine upon the operating table.  After induction of general anesthesia the right upper chest and left breast and axilla were prepped and draped in a sterile fashion.  4 cc of methylene blue dye were injected after timeout under the left nipple.  This was massaged for 5 minutes.  Incision was made over the port site since she was done chemotherapy.  Dissection was carried down the port was removed in its entirety.  The port tract was closed with 3-0 Vicryl and the deep layers were closed with 3-0 Vicryl and 4-0 Monocryl.  The neoprobe was used to identify the seeds in the left breast with the help of the films that were available for review intraoperatively.  2 areas one in the left upper outer quadrant and the second in the left upper outer but more towards the lower quadrant at about 3:00 were noted.  Incision was made over both sides.  The entire specimen was excised as one large specimen with grossly negative margins.  Neoprobe was used to help dissection.  Specimen radiograph showed both seeds and both clips to be in the specimen and a photograph was taken.  Cavities made hemostatic and clip.  This was closed with 3-0 Vicryl and 4-0 Monocryl.  The left axilla was then examined with the neoprobe.  The seed was localized in the left axilla.  Incision was made in the left axilla crease.  Dissection was carried down the seed and node were identified and removed separately.  This was also hot by technetium evaluation.  This was passed off the field and Faxitron image showed the seed  and then noted to be present.  We then dissected the axilla out and there were 3 more sentinel nodes that were hot and blue.  These were removed and background counts then approached 0.  The long thoracic nerve, axillary vein and thoracodorsal trunk were all preserved.  Irrigation was used and hemostasis achieved.  This cavity was closed with 3-0 Vicryl and 4-0 Monocryl.  Dermabond applied to all incisions and a breast binder was placed.  All counts were found to be correct and all specimens were sent to pathology with a total of 3 seeds.  Breast binder placed.  Patient was awoke extubated taken recovery in satisfactory condition.

## 2019-02-22 NOTE — Anesthesia Procedure Notes (Addendum)
Anesthesia Regional Block: Pectoralis block   Pre-Anesthetic Checklist: ,, timeout performed, Correct Patient, Correct Site, Correct Laterality, Correct Procedure, Correct Position, site marked, Risks and benefits discussed,  Surgical consent,  Pre-op evaluation,  At surgeon's request and post-op pain management  Laterality: Left  Prep: chloraprep       Needles:  Injection technique: Single-shot  Needle Type: Echogenic Stimulator Needle     Needle Length: 9cm  Needle Gauge: 21     Additional Needles:   Procedures:,,,, ultrasound used (permanent image in chart),,,,  Narrative:  Start time: 02/22/2019 8:50 AM End time: 02/22/2019 9:00 AM Injection made incrementally with aspirations every 5 mL.  Performed by: Personally  Anesthesiologist: Lidia Collum, MD  Additional Notes: Monitors applied. Injection made in 5cc increments. No resistance to injection. Good needle visualization. Patient tolerated procedure well.

## 2019-02-22 NOTE — Interval H&P Note (Signed)
History and Physical Interval Note:  02/22/2019 9:15 AM  [name]  has presented today for surgery, with the diagnosis of LEFT BREAST CANCER.  The various methods of treatment have been discussed with the patient and family. After consideration of risks, benefits and other options for treatment, the patient has consented to  Procedure(s): LEFT BREAST LUMPECTOMY x2 WITH RADIOACTIVE SEED AND LEFT AXILLA SEED  GUIDED LYMPH NODE BIOPSY AND LEFT SENTINEL LYMPH NODE MAPPING (Left) REMOVAL PORT-A-CATH (N/A) as a surgical intervention.  The patient's history has been reviewed, patient examined, no change in status, stable for surgery.  I have reviewed the patient's chart and labs.  Questions were answered to the patient's satisfaction.     [place]

## 2019-02-22 NOTE — Progress Notes (Signed)
Assisted Dr. Christella Hartigan with left, ultrasound guided, pectoralis block. Side rails up, monitors on throughout procedure. See vital signs in flow sheet. Tolerated Procedure well.

## 2019-02-23 ENCOUNTER — Encounter (HOSPITAL_BASED_OUTPATIENT_CLINIC_OR_DEPARTMENT_OTHER): Payer: Self-pay | Admitting: Surgery

## 2019-02-23 NOTE — Addendum Note (Signed)
Addendum  created 02/23/19 0909 by Lidia Collum, MD   Clinical Note Signed, Intraprocedure Blocks edited

## 2019-02-23 NOTE — Addendum Note (Signed)
Addendum  created 02/23/19 0714 by Willa Frater, CRNA   Charge Capture section accepted

## 2019-02-24 ENCOUNTER — Ambulatory Visit: Payer: BC Managed Care – PPO | Admitting: Hematology and Oncology

## 2019-02-24 ENCOUNTER — Other Ambulatory Visit: Payer: BC Managed Care – PPO

## 2019-02-24 ENCOUNTER — Encounter: Payer: Self-pay | Admitting: *Deleted

## 2019-02-24 ENCOUNTER — Ambulatory Visit: Payer: BC Managed Care – PPO

## 2019-02-24 NOTE — Assessment & Plan Note (Addendum)
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  11/01/2018: MRI biopsy left breast IDC grade 1, ER PR positive HER-2 negative  Treatment plan: 1.Neoadjuvant chemotherapy with dose dense Adriamycin and Cytoxan every 2 weeks x4 followed by Taxol weekly x 3 discontinued due to severe peripheral neuropathy 2.Followed by mastectomy on 02/22/2019 3.Followed by radiation 4.Followed by antiestrogen therapy. ----------------------------------------------------------------------------------------------------------------------------------------------------- Left lumpectomy: 02/22/2019:([facility]): IDC with DCIS, 5.8cm, margins negative, lymphovascular invasion present HER2 negative, ER 90%, PR 90%, Ki67 2%, clear margins,3/5 LN positive for malignancy.   Pathology counseling: I discussed the final pathology report of the patient provided  a copy of this report. I discussed the margins as well as lymph node surgeries. We also discussed the final staging along with previously performed ER/PR and HER-2/neu testing.  Treatment plan:  1.  Axillary lymph node dissection  2. Adjuvant radiation followed by  3. adjuvant antiestrogen therapy with consideration for Natalee clinical trial Return to clinic after radiation therapy is complete.

## 2019-02-26 ENCOUNTER — Telehealth: Payer: Self-pay | Admitting: Surgery

## 2019-02-26 NOTE — Telephone Encounter (Signed)
[name] had a lumpectomy/SLNBx on 02/22/2019.  She started draining pink fluid from her wound last night.  She has no fever and the wound is otherwise okay.  She will keep the wound clean with soap and water and use gauze over the wound to collect the fluid.  She is to talk to Dr. Brantley Stage on Monday and will discuss this with him at that time.  Alphonsa Overall, MD, [facility] Surgery Pager: [phone] Office phone:  [phone]

## 2019-02-28 ENCOUNTER — Ambulatory Visit: Payer: Self-pay | Admitting: Surgery

## 2019-02-28 NOTE — H&P (Signed)
Pt noted to have 2 of 5 positive nodes after neoadjuvant and SLN mapping  Had some serous drainage over the weekend but clear Discussed with oncology and ALND next step to clear axilla Axillary  dissection has been discussed with the patient.  Risk of bleeding,  Infection, arm swelling   Seroma formation,  Additional procedures,,  Shoulder weakness ,  Shoulder stiffness,  Nerve and blood vessel injury and reaction to the mapping dyes have been discussed.  Alternatives to surgery have been discussed with the patient.  The patient agrees to proceed.

## 2019-03-01 NOTE — Progress Notes (Signed)
HEMATOLOGY-ONCOLOGY TELEPHONE VISIT PROGRESS NOTE  I connected with [name] on 03/02/19 at 12:00 PM EDT by telephone and verified that I am speaking with the correct person using two identifiers.  I discussed the limitations, risks, security and privacy concerns of performing an evaluation and management service by telephone and the availability of in person appointments.  I also discussed with the patient that there may be a patient responsible charge related to this service. The patient expressed understanding and agreed to proceed.   CHIEF COMPLIANT: Follow-up s/p lumpectomy to review pathology   INTERVAL HISTORY: [name] is a 58 y.o. with above-mentioned history of left breast cancer who completed neoadjuvant chemotherapy. Breast MRI on 02/01/19 showed the known malignancy measuring 4.7 x 3.5 x 7.2cm, previously 5 x 3 x 10cm. She underwent a lumpectomy on 02/22/19 for which pathology confirmed IDC with DCIS, 5.8cm, HER2 negative, ER 90%, PR 90%, Ki67 2%, clear margins, 2 ALN positive for malignancy, 1/3 SLN positive for malignancy. She will undergo an axillary lymph node dissection on 03/11/19. She presents today to discuss the pathology report.      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, lymph node biopsy positive, intramammary lymph node biopsy negative, ER 100%, PR 100%, Ki-67 20%, HER-2 1+ by IHC, T2 and 1 stage 2A    10/27/2018 Cancer Staging    Staging form: Breast, AJCC 8th Edition - Clinical: Stage IIA (cT2, cN1(f), cM0, G1, ER+, PR+, HER2-) - Signed by Nicholas Lose, MD on 11/01/2018    11/11/2018 - 12/20/2018 Neo-Adjuvant Chemotherapy    Neoadjuvant chemotherapy  with dose dense Adriamycin and Cytoxan every 2 weeks x4 followed by Taxol weekly x12 (stopped after cycle 3 due to severe neuropathy)    02/01/2019 Breast MRI    Known malignancy in left breast measures 4.7 x 3.5 x 7.2cm, previously 5 x 3 x 10cm.     02/22/2019 Surgery    Left lumpectomy ([facility]): IDC with DCIS, 5.8cm, HER2 negative, ER 90%, PR 90%, Ki67 2%, clear margins,3/5 LN positive for malignancy.      REVIEW OF SYSTEMS:   Constitutional: Denies fevers, chills or abnormal weight loss Eyes: Denies blurriness of vision Ears, nose, mouth, throat, and face: Denies mucositis or sore throat Respiratory: Denies cough, dyspnea or wheezes Cardiovascular: Denies palpitation, chest discomfort Gastrointestinal:  Denies nausea, heartburn or change in bowel habits Skin: Denies abnormal skin rashes Lymphatics: Denies new lymphadenopathy or easy bruising Neurological:Denies numbness, tingling or new weaknesses Behavioral/Psych: Mood is stable, no new changes  Extremities: No lower extremity edema Breast: Recent left lumpectomy All other systems were reviewed with the patient and are negative.  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  11/01/2018: MRI biopsy left breast IDC grade 1, ER PR positive HER-2 negative  Treatment plan: 1.Neoadjuvant chemotherapy with dose dense Adriamycin  and Cytoxan every 2 weeks x4 followed by Taxol weekly x 3 discontinued due to severe peripheral neuropathy 2.Followed by mastectomy on 02/22/2019 3.Followed by radiation 4.Followed by antiestrogen therapy. ----------------------------------------------------------------------------------------------------------------------------------------------------- Left lumpectomy: 02/22/2019:([facility]): IDC with DCIS, 5.8cm, margins negative, lymphovascular invasion present HER2 negative, ER 90%, PR 90%, Ki67 2%, clear margins,3/5 LN positive for malignancy.   Pathology counseling: I discussed the final pathology report of the patient provided  a copy of this report. I discussed the margins as well as lymph node surgeries. We also discussed the final staging along with previously performed ER/PR and HER-2/neu testing.  Treatment plan:  1.  Axillary lymph node dissection  2. Adjuvant radiation followed by  3. adjuvant antiestrogen therapy with consideration for Natalee clinical trial Return to clinic after radiation therapy is complete.  I discussed the assessment and treatment plan with the patient. The patient was provided an opportunity to ask questions and all were answered. The patient agreed with the plan and demonstrated an understanding of the instructions. The patient was advised to call back or seek an in-person evaluation if the symptoms worsen or if the condition fails to improve as anticipated.   I provided 15 minutes of non-face-to-face time during this encounter. Harriette Ohara, MD

## 2019-03-02 ENCOUNTER — Inpatient Hospital Stay: Payer: BC Managed Care – PPO | Attending: Hematology and Oncology | Admitting: Hematology and Oncology

## 2019-03-02 DIAGNOSIS — C50412 Malignant neoplasm of upper-outer quadrant of left female breast: Secondary | ICD-10-CM | POA: Diagnosis not present

## 2019-03-02 DIAGNOSIS — Z170 Estrogen receptor positive status [ER+]: Secondary | ICD-10-CM | POA: Diagnosis not present

## 2019-03-03 ENCOUNTER — Ambulatory Visit: Payer: BC Managed Care – PPO

## 2019-03-03 ENCOUNTER — Encounter (HOSPITAL_BASED_OUTPATIENT_CLINIC_OR_DEPARTMENT_OTHER): Payer: Self-pay

## 2019-03-03 ENCOUNTER — Other Ambulatory Visit: Payer: Self-pay | Admitting: Hematology and Oncology

## 2019-03-03 ENCOUNTER — Other Ambulatory Visit: Payer: BC Managed Care – PPO

## 2019-03-03 ENCOUNTER — Other Ambulatory Visit: Payer: Self-pay

## 2019-03-08 ENCOUNTER — Other Ambulatory Visit (HOSPITAL_COMMUNITY)
Admission: RE | Admit: 2019-03-08 | Discharge: 2019-03-08 | Disposition: A | Discharge status: Home / Self Care | Payer: BC Managed Care – PPO | Source: Ambulatory Visit | Attending: Surgery | Admitting: Surgery

## 2019-03-08 ENCOUNTER — Ambulatory Visit: Payer: Self-pay | Admitting: Surgery

## 2019-03-08 ENCOUNTER — Other Ambulatory Visit: Payer: Self-pay

## 2019-03-08 DIAGNOSIS — Z1159 Encounter for screening for other viral diseases: Secondary | ICD-10-CM | POA: Insufficient documentation

## 2019-03-08 NOTE — Progress Notes (Signed)
Ensure pre surgery drink given with instructions to complete 0600 dos, pt verbalized understanding.

## 2019-03-08 NOTE — H&P (Signed)
[name] Documented: 03/08/2019 9:29 AM Location: [facility] Patient #: [id number] DOB: [birth year] Married / Language: English / Race: Black or African American Female  History of Present Illness Tammy Moores A. Vonette Grosso MD; 03/08/2019 10:04 AM) Patient words: Patient returns after her neoadjuvant chemotherapy and left breast lumpectomy with sentinel lymph node mapping. She had multiple positive nodes and therefore is to undergo a lymph node dissection on Friday. She has some clear drainage from the incision.           ADDITIONAL INFORMATION: 1. PROGNOSTIC INDICATORS Results: IMMUNOHISTOCHEMICAL AND MORPHOMETRIC ANALYSIS PERFORMED MANUALLY The tumor cells are NEGATIVE for Her2 (1+). Estrogen Receptor: 90%, POSITIVE, STRONG STAINING INTENSITY Progesterone Receptor: 90%, POSITIVE, STRONG STAINING INTENSITY Proliferation Marker Ki67: 2% REFERENCE RANGE ESTROGEN RECEPTOR NEGATIVE 0% POSITIVE =>1% REFERENCE RANGE PROGESTERONE RECEPTOR NEGATIVE 0% POSITIVE =>1% All controls stained appropriately Tammy Cutter MD Pathologist, Electronic Signature ( Signed 02/25/2019) FINAL DIAGNOSIS Diagnosis 1. 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 - MARGINS OF RESECTION ARE NOT INVOLVED (CLOSEST MARGIN: LESS THAN 1 MM FROM EACH THE MEDIAL AND POSTERIOR MARGINS). - DUCTAL CARCINOMA IN SITU. 1 of 4 FINAL for [name] ([id number]) Diagnosis(continued) - LYMPHOVASCULAR INVASION IS PRESENT. - SEE ONCOLOGY TABLE. 2. Lymph node, biopsy, left axillary seed (1seed) - METASTATIC CARCINOMA PRESENT IN (2) OF (2) LYMPH NODES. 3. Lymph node, sentinel, biopsy, left axillary - ONE LYMPH NODE, NEGATIVE FOR CARCINOMA (0/1). 4. Lymph node, sentinel, biopsy, left - METASTATIC CARCINOMA PRESENT IN (1) OF (1) LYMPH NODE. 5. Lymph node, sentinel, biopsy, left - ONE LYMPH NODE, NEGATIVE FOR CARCINOMA  (0/1). Microscopic Comment 1. BREAST, STATUS POST NEOADJUVANT TREATMENT Procedure: Lumpectomy Laterality: Left Tumor Size: 5.8 cm Histologic Type: Invasive ductal carcinoma Grade: 2 Tubular Differentiation: 3 Nuclear Pleomorphism: 2 Mitotic Count: 1 Ductal Carcinoma in Situ (DCIS): Present Regional Lymph Nodes: Number of Lymph Nodes Examined: 5 Number of Sentinel Lymph Nodes Examined: 5 Lymph Nodes with Macrometastases: 3 Lymph Nodes with Micrometastases: 0 Lymph Nodes with Isolated Tumor Cells: 0 Margins: Invasive carcinoma, distance from closest margin: Less than 1 mm from each the medial and posterior margins DCIS, distance from closest margin: Less than 1 mm from each the medial and posterior margins Extent of Tumor: Confined to breast parenchyma Treatment Effect in the Breast: Probable or definite response to presurgical therapy in the invasive carcinoma Treatment Effect in the Lymph Nodes: Probable or definite response to presurgical therapy in metastatic carcinoma Breast Prognostic Profile (pre-neoadjuvant case #: [id number]): Estrogen Receptor: 100%, positive, strong Progesterone Receptor: 100%, positive, strong Her2: Negative (1+) Ki-67: 20% Breast Prognostic Profile (pre-neoadjuvant case #: [id number]): Estrogen Receptor: 95%, positive, strong Progesterone Receptor: 90%, positive, strong Her2: Negative (1+) Ki-67: 5% Will be repeated on the current case (Block #: 1-M) and the results reported separately. Residual Cancer Burden (RCB): Primary Tumor Bed: 58 mm x 31 mm Overall Cancer Cellularity: 30% Percentage of Cancer that is In Situ: 10% 2 of 4 FINAL for [name] Microscopic Comment(continued) Number of Positive Lymph Nodes: 3 Diameter of Largest Lymph Node Metastasis: 32 mm Residual Cancer Burden: 4.198 Residual Cancer Burden Class: RCB-III Pathologic Stage Classification (p TNM, AJCC 8th Edition): Primary Tumor (ypT):  ypT3 Regional Lymph Nodes (ypN): ypN1a 2. Biopsy site changes are noted in the larger of the two lymph nodes. Tammy Manners MD Pathologist, Electronic Signature (Case signed 02/24/2019) Specimen Gross and Clinical Information Specimen(s) Obtained: 1. Breast, lumpectomy, left breast seed (2seeds) 2.  Lymph node, biopsy, left axillary seed (1seed) 3. Lymph node, sentinel, biopsy, left axillary 4. Lymph node, sentinel, biopsy, left 5. Lymph node, sentinel,.  The patient is a 58 year old female.   Allergies [name], CMA; 03/08/2019 9:29 AM) No Known Allergies [10/25/2018]: Allergies Reconciled  Medication History [name], CMA; 03/08/2019 9:29 AM) Carvedilol (6.25MG Tablet, Oral) Active. Anastrozole (1MG Tablet, Oral) Active. Lisinopril-hydroCHLOROthiazide (20-25MG Tablet, Oral) Active. Tylenol (325MG Tablet, Oral) Active. Pantoprazole Sodium (40MG Tablet DR, Oral) Active. Medications Reconciled    Vitals [name] CMA; 03/08/2019 9:30 AM) 03/08/2019 9:29 AM Weight: 271.5 lb Height: 74in Body Surface Area: 2.47 m Body Mass Index: 34.86 kg/m  Temp.: 98.5F(Oral)  Pulse: 114 (Regular)  BP: 130/84 (Sitting, Left Arm, Standard)      Physical Exam (Tammy Teed A. Ziah Turvey MD; 03/08/2019 10:05 AM)  Breast Note: Left breast incision clean dry and intact. Left axilla incision is open draining serous fluid without signs of infection. This was packed with 1/2 inch packing.    Assessment & Plan (Tammy Margerum A. Cordarrel Stiefel MD; 03/08/2019 10:05 AM)  POST-OPERATIVE STATE [phone]) Impression: Left axillary lymph node dissection on Friday. Risks and benefits of surgery discussed. The rationale for surgery discussed which includes possible residual gross disease after neoadjuvant chemotherapy and positive lymph nodes. Risk of bleeding L infection, lymphedema, chronic wound, numbness, dysfunction, any further surgeries or operations discussed.  Current  Plans Pt Education - [facility] Free Text Education/Instructions: discussed with patient and provided information.

## 2019-03-08 NOTE — H&P (View-Only) (Signed)
[name] Documented: 03/08/2019 9:29 AM Location: [facility] Patient #: [id number] DOB: [birth year] Married / Language: English / Race: Black or African American Female  History of Present Illness Tammy Moores A. Jonluke Cobbins MD; 03/08/2019 10:04 AM) Patient words: Patient returns after her neoadjuvant chemotherapy and left breast lumpectomy with sentinel lymph node mapping. She had multiple positive nodes and therefore is to undergo a lymph node dissection on Friday. She has some clear drainage from the incision.           ADDITIONAL INFORMATION: 1. PROGNOSTIC INDICATORS Results: IMMUNOHISTOCHEMICAL AND MORPHOMETRIC ANALYSIS PERFORMED MANUALLY The tumor cells are NEGATIVE for Her2 (1+). Estrogen Receptor: 90%, POSITIVE, STRONG STAINING INTENSITY Progesterone Receptor: 90%, POSITIVE, STRONG STAINING INTENSITY Proliferation Marker Ki67: 2% REFERENCE RANGE ESTROGEN RECEPTOR NEGATIVE 0% POSITIVE =>1% REFERENCE RANGE PROGESTERONE RECEPTOR NEGATIVE 0% POSITIVE =>1% All controls stained appropriately Tammy Cutter MD Pathologist, Electronic Signature ( Signed 02/25/2019) FINAL DIAGNOSIS Diagnosis 1. 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 - MARGINS OF RESECTION ARE NOT INVOLVED (CLOSEST MARGIN: LESS THAN 1 MM FROM EACH THE MEDIAL AND POSTERIOR MARGINS). - DUCTAL CARCINOMA IN SITU. 1 of 4 FINAL for [name], [name] ([id number]) Diagnosis(continued) - LYMPHOVASCULAR INVASION IS PRESENT. - SEE ONCOLOGY TABLE. 2. Lymph node, biopsy, left axillary seed (1seed) - METASTATIC CARCINOMA PRESENT IN (2) OF (2) LYMPH NODES. 3. Lymph node, sentinel, biopsy, left axillary - ONE LYMPH NODE, NEGATIVE FOR CARCINOMA (0/1). 4. Lymph node, sentinel, biopsy, left - METASTATIC CARCINOMA PRESENT IN (1) OF (1) LYMPH NODE. 5. Lymph node, sentinel, biopsy, left - ONE LYMPH NODE, NEGATIVE FOR CARCINOMA  (0/1). Microscopic Comment 1. BREAST, STATUS POST NEOADJUVANT TREATMENT Procedure: Lumpectomy Laterality: Left Tumor Size: 5.8 cm Histologic Type: Invasive ductal carcinoma Grade: 2 Tubular Differentiation: 3 Nuclear Pleomorphism: 2 Mitotic Count: 1 Ductal Carcinoma in Situ (DCIS): Present Regional Lymph Nodes: Number of Lymph Nodes Examined: 5 Number of Sentinel Lymph Nodes Examined: 5 Lymph Nodes with Macrometastases: 3 Lymph Nodes with Micrometastases: 0 Lymph Nodes with Isolated Tumor Cells: 0 Margins: Invasive carcinoma, distance from closest margin: Less than 1 mm from each the medial and posterior margins DCIS, distance from closest margin: Less than 1 mm from each the medial and posterior margins Extent of Tumor: Confined to breast parenchyma Treatment Effect in the Breast: Probable or definite response to presurgical therapy in the invasive carcinoma Treatment Effect in the Lymph Nodes: Probable or definite response to presurgical therapy in metastatic carcinoma Breast Prognostic Profile (pre-neoadjuvant case #: [id number]): Estrogen Receptor: 100%, positive, strong Progesterone Receptor: 100%, positive, strong Her2: Negative (1+) Ki-67: 20% Breast Prognostic Profile (pre-neoadjuvant case #: [id number]): Estrogen Receptor: 95%, positive, strong Progesterone Receptor: 90%, positive, strong Her2: Negative (1+) Ki-67: 5% Will be repeated on the current case (Block #: 1-M) and the results reported separately. Residual Cancer Burden (RCB): Primary Tumor Bed: 58 mm x 31 mm Overall Cancer Cellularity: 30% Percentage of Cancer that is In Situ: 10% 2 of 4 FINAL for [name], [name] Microscopic Comment(continued) Number of Positive Lymph Nodes: 3 Diameter of Largest Lymph Node Metastasis: 32 mm Residual Cancer Burden: 4.198 Residual Cancer Burden Class: RCB-III Pathologic Stage Classification (p TNM, AJCC 8th Edition): Primary Tumor (ypT):  ypT3 Regional Lymph Nodes (ypN): ypN1a 2. Biopsy site changes are noted in the larger of the two lymph nodes. Tammy Manners MD Pathologist, Electronic Signature (Case signed 02/24/2019) Specimen Gross and Clinical Information Specimen(s) Obtained: 1. Breast, lumpectomy, left breast seed (2seeds) 2.  Lymph node, biopsy, left axillary seed (1seed) 3. Lymph node, sentinel, biopsy, left axillary 4. Lymph node, sentinel, biopsy, left 5. Lymph node, sentinel,.  The patient is a 58 year old female.   Allergies Sabino Gasser, CMA; 03/08/2019 9:29 AM) No Known Allergies [10/25/2018]: Allergies Reconciled  Medication History Sabino Gasser, CMA; 03/08/2019 9:29 AM) Carvedilol (6.25MG Tablet, Oral) Active. Anastrozole (1MG Tablet, Oral) Active. Lisinopril-hydroCHLOROthiazide (20-25MG Tablet, Oral) Active. Tylenol (325MG Tablet, Oral) Active. Pantoprazole Sodium (40MG Tablet DR, Oral) Active. Medications Reconciled    Vitals Sabino Gasser CMA; 03/08/2019 9:30 AM) 03/08/2019 9:29 AM Weight: 271.5 lb Height: 74in Body Surface Area: 2.47 m Body Mass Index: 34.86 kg/m  Temp.: 98.5F(Oral)  Pulse: 114 (Regular)  BP: 130/84 (Sitting, Left Arm, Standard)      Physical Exam (Aleira Deiter A. Chanse Kagel MD; 03/08/2019 10:05 AM)  Breast Note: Left breast incision clean dry and intact. Left axilla incision is open draining serous fluid without signs of infection. This was packed with 1/2 inch packing.    Assessment & Plan (Levon Penning A. Tory Mckissack MD; 03/08/2019 10:05 AM)  POST-OPERATIVE STATE [phone]) Impression: Left axillary lymph node dissection on Friday. Risks and benefits of surgery discussed. The rationale for surgery discussed which includes possible residual gross disease after neoadjuvant chemotherapy and positive lymph nodes. Risk of bleeding L infection, lymphedema, chronic wound, numbness, dysfunction, any further surgeries or operations discussed.  Current  Plans Pt Education - [facility] Free Text Education/Instructions: discussed with patient and provided information.

## 2019-03-09 LAB — NOVEL CORONAVIRUS, NAA (HOSP ORDER, SEND-OUT TO REF LAB; TAT 18-24 HRS): SARS-CoV-2, NAA: NOT DETECTED

## 2019-03-10 ENCOUNTER — Other Ambulatory Visit: Payer: BC Managed Care – PPO

## 2019-03-10 ENCOUNTER — Ambulatory Visit: Payer: BC Managed Care – PPO

## 2019-03-10 ENCOUNTER — Ambulatory Visit: Payer: BC Managed Care – PPO | Admitting: Hematology and Oncology

## 2019-03-11 ENCOUNTER — Encounter (HOSPITAL_BASED_OUTPATIENT_CLINIC_OR_DEPARTMENT_OTHER)
Admission: RE | Disposition: A | Discharge status: Home / Self Care | Payer: Self-pay | Source: Home / Self Care | Attending: Surgery

## 2019-03-11 ENCOUNTER — Ambulatory Visit (HOSPITAL_BASED_OUTPATIENT_CLINIC_OR_DEPARTMENT_OTHER): Payer: BC Managed Care – PPO | Admitting: Certified Registered Nurse Anesthetist

## 2019-03-11 ENCOUNTER — Other Ambulatory Visit: Payer: Self-pay

## 2019-03-11 ENCOUNTER — Ambulatory Visit (HOSPITAL_BASED_OUTPATIENT_CLINIC_OR_DEPARTMENT_OTHER)
Admission: RE | Admit: 2019-03-11 | Discharge: 2019-03-12 | Disposition: A | Discharge status: Home / Self Care | Payer: BC Managed Care – PPO | Attending: Surgery | Admitting: Surgery

## 2019-03-11 ENCOUNTER — Encounter (HOSPITAL_BASED_OUTPATIENT_CLINIC_OR_DEPARTMENT_OTHER): Payer: Self-pay

## 2019-03-11 DIAGNOSIS — Z9221 Personal history of antineoplastic chemotherapy: Secondary | ICD-10-CM | POA: Insufficient documentation

## 2019-03-11 DIAGNOSIS — C50912 Malignant neoplasm of unspecified site of left female breast: Secondary | ICD-10-CM | POA: Diagnosis not present

## 2019-03-11 DIAGNOSIS — Z79899 Other long term (current) drug therapy: Secondary | ICD-10-CM | POA: Insufficient documentation

## 2019-03-11 DIAGNOSIS — Z79811 Long term (current) use of aromatase inhibitors: Secondary | ICD-10-CM | POA: Insufficient documentation

## 2019-03-11 DIAGNOSIS — C773 Secondary and unspecified malignant neoplasm of axilla and upper limb lymph nodes: Secondary | ICD-10-CM | POA: Insufficient documentation

## 2019-03-11 DIAGNOSIS — Z170 Estrogen receptor positive status [ER+]: Secondary | ICD-10-CM | POA: Insufficient documentation

## 2019-03-11 HISTORY — PX: AXILLARY LYMPH NODE DISSECTION: SHX5229

## 2019-03-11 SURGERY — LYMPHADENECTOMY, AXILLARY
Anesthesia: General | Site: Breast | Laterality: Left

## 2019-03-11 MED ORDER — LIDOCAINE 2% (20 MG/ML) 5 ML SYRINGE
INTRAMUSCULAR | Status: DC | PRN
  Administered 2019-03-11: 80 mg via INTRAVENOUS

## 2019-03-11 MED ORDER — FENTANYL CITRATE (PF) 100 MCG/2ML IJ SOLN
INTRAMUSCULAR | Status: AC
  Filled 2019-03-11: qty 2

## 2019-03-11 MED ORDER — ONDANSETRON HCL 4 MG/2ML IJ SOLN
INTRAMUSCULAR | Status: AC
  Filled 2019-03-11: qty 2

## 2019-03-11 MED ORDER — MIDAZOLAM HCL 2 MG/2ML IJ SOLN
1.0000 mg | INTRAMUSCULAR | Status: DC | PRN
  Administered 2019-03-11: 2 mg via INTRAVENOUS

## 2019-03-11 MED ORDER — BUPIVACAINE-EPINEPHRINE 0.25% -1:200000 IJ SOLN
INTRAMUSCULAR | Status: DC | PRN
  Administered 2019-03-11: 10 mL

## 2019-03-11 MED ORDER — ACETAMINOPHEN 500 MG PO TABS
ORAL_TABLET | ORAL | Status: AC
  Filled 2019-03-11: qty 2

## 2019-03-11 MED ORDER — HYDROMORPHONE HCL 1 MG/ML IJ SOLN
INTRAMUSCULAR | Status: AC
  Filled 2019-03-11: qty 0.5

## 2019-03-11 MED ORDER — OXYCODONE HCL 5 MG PO TABS: 5.0000 mg | ORAL_TABLET | Freq: Four times a day (QID) | ORAL | 0 refills | Status: DC | PRN

## 2019-03-11 MED ORDER — FENTANYL CITRATE (PF) 100 MCG/2ML IJ SOLN
25.0000 ug | INTRAMUSCULAR | Status: DC | PRN
  Administered 2019-03-11 (×3): 50 ug via INTRAVENOUS

## 2019-03-11 MED ORDER — DEXTROSE 5 % IV SOLN
3.0000 g | INTRAVENOUS | Status: AC
  Administered 2019-03-11: 3 g via INTRAVENOUS

## 2019-03-11 MED ORDER — SODIUM CHLORIDE 0.9 % IV SOLN
INTRAVENOUS | Status: DC
  Administered 2019-03-11: 17:00:00 via INTRAVENOUS

## 2019-03-11 MED ORDER — DEXMEDETOMIDINE BOLUS VIA INFUSION: 0.4000 ug/kg | Freq: Once | INTRAVENOUS | Status: DC

## 2019-03-11 MED ORDER — CHLORHEXIDINE GLUCONATE CLOTH 2 % EX PADS: 6.0000 | MEDICATED_PAD | Freq: Once | CUTANEOUS | Status: DC

## 2019-03-11 MED ORDER — MIDAZOLAM HCL 2 MG/2ML IJ SOLN
1.0000 mg | Freq: Once | INTRAMUSCULAR | Status: AC
  Administered 2019-03-11: 1 mg via INTRAVENOUS

## 2019-03-11 MED ORDER — PHENYLEPHRINE 40 MCG/ML (10ML) SYRINGE FOR IV PUSH (FOR BLOOD PRESSURE SUPPORT)
PREFILLED_SYRINGE | INTRAVENOUS | Status: DC | PRN
  Administered 2019-03-11: 120 ug via INTRAVENOUS
  Administered 2019-03-11 (×2): 80 ug via INTRAVENOUS

## 2019-03-11 MED ORDER — MIDAZOLAM HCL 2 MG/2ML IJ SOLN
INTRAMUSCULAR | Status: AC
  Filled 2019-03-11: qty 2

## 2019-03-11 MED ORDER — ACETAMINOPHEN 500 MG PO TABS: 1000.0000 mg | ORAL_TABLET | Freq: Once | ORAL | Status: DC

## 2019-03-11 MED ORDER — DEXAMETHASONE SODIUM PHOSPHATE 10 MG/ML IJ SOLN
INTRAMUSCULAR | Status: DC | PRN
  Administered 2019-03-11: 10 mg via INTRAVENOUS

## 2019-03-11 MED ORDER — ONDANSETRON HCL 4 MG/2ML IJ SOLN
4.0000 mg | Freq: Four times a day (QID) | INTRAMUSCULAR | Status: DC | PRN
  Administered 2019-03-12: 4 mg via INTRAVENOUS
  Filled 2019-03-11: qty 2

## 2019-03-11 MED ORDER — PROPOFOL 10 MG/ML IV BOLUS
INTRAVENOUS | Status: DC | PRN
  Administered 2019-03-11: 200 mg via INTRAVENOUS

## 2019-03-11 MED ORDER — SCOPOLAMINE 1 MG/3DAYS TD PT72
MEDICATED_PATCH | TRANSDERMAL | Status: AC
  Filled 2019-03-11: qty 1

## 2019-03-11 MED ORDER — OXYCODONE HCL 5 MG/5ML PO SOLN: 5.0000 mg | Freq: Once | ORAL | Status: DC | PRN

## 2019-03-11 MED ORDER — OXYCODONE-ACETAMINOPHEN 5-325 MG PO TABS: 1.0000 | ORAL_TABLET | ORAL | Status: DC | PRN

## 2019-03-11 MED ORDER — LACTATED RINGERS IV SOLN
INTRAVENOUS | Status: DC
  Administered 2019-03-11 (×2): via INTRAVENOUS

## 2019-03-11 MED ORDER — HYDROMORPHONE HCL 1 MG/ML IJ SOLN
1.0000 mg | INTRAMUSCULAR | Status: DC | PRN
  Administered 2019-03-12: 1 mg via INTRAVENOUS
  Filled 2019-03-11: qty 1

## 2019-03-11 MED ORDER — HYDROMORPHONE HCL 1 MG/ML IJ SOLN
1.0000 mg | Freq: Once | INTRAMUSCULAR | Status: AC
  Administered 2019-03-11: 1 mg via INTRAVENOUS

## 2019-03-11 MED ORDER — BUPIVACAINE LIPOSOME 1.3 % IJ SUSP
INTRAMUSCULAR | Status: DC | PRN
  Administered 2019-03-11: 10 mL via PERINEURAL

## 2019-03-11 MED ORDER — BUPIVACAINE-EPINEPHRINE (PF) 0.5% -1:200000 IJ SOLN
INTRAMUSCULAR | Status: DC | PRN
  Administered 2019-03-11: 20 mL via PERINEURAL

## 2019-03-11 MED ORDER — HYDROMORPHONE HCL 1 MG/ML IJ SOLN
0.5000 mg | Freq: Once | INTRAMUSCULAR | Status: AC
  Administered 2019-03-11: 0.5 mg via INTRAVENOUS

## 2019-03-11 MED ORDER — GABAPENTIN 300 MG PO CAPS
300.0000 mg | ORAL_CAPSULE | ORAL | Status: AC
  Administered 2019-03-11: 300 mg via ORAL

## 2019-03-11 MED ORDER — LIDOCAINE 2% (20 MG/ML) 5 ML SYRINGE
INTRAMUSCULAR | Status: AC
  Filled 2019-03-11: qty 5

## 2019-03-11 MED ORDER — ONDANSETRON HCL 4 MG/2ML IJ SOLN
4.0000 mg | Freq: Once | INTRAMUSCULAR | Status: AC | PRN
  Administered 2019-03-11: 4 mg via INTRAVENOUS

## 2019-03-11 MED ORDER — ONDANSETRON HCL 4 MG/2ML IJ SOLN
INTRAMUSCULAR | Status: DC | PRN
  Administered 2019-03-11: 4 mg via INTRAVENOUS

## 2019-03-11 MED ORDER — CEFAZOLIN SODIUM-DEXTROSE 2-4 GM/100ML-% IV SOLN
INTRAVENOUS | Status: AC
  Filled 2019-03-11: qty 200

## 2019-03-11 MED ORDER — KETOROLAC TROMETHAMINE 15 MG/ML IJ SOLN
15.0000 mg | Freq: Four times a day (QID) | INTRAMUSCULAR | Status: DC | PRN
  Administered 2019-03-11: 15 mg via INTRAVENOUS
  Filled 2019-03-11: qty 1

## 2019-03-11 MED ORDER — ACETAMINOPHEN 325 MG PO TABS: 325.0000 mg | ORAL_TABLET | ORAL | Status: DC | PRN

## 2019-03-11 MED ORDER — SCOPOLAMINE 1 MG/3DAYS TD PT72
1.0000 | MEDICATED_PATCH | Freq: Once | TRANSDERMAL | Status: DC | PRN
  Administered 2019-03-11: 1.5 mg via TRANSDERMAL

## 2019-03-11 MED ORDER — DEXAMETHASONE SODIUM PHOSPHATE 10 MG/ML IJ SOLN
INTRAMUSCULAR | Status: AC
  Filled 2019-03-11: qty 1

## 2019-03-11 MED ORDER — KETOROLAC TROMETHAMINE 30 MG/ML IJ SOLN
INTRAMUSCULAR | Status: AC
  Filled 2019-03-11: qty 1

## 2019-03-11 MED ORDER — KETOROLAC TROMETHAMINE 30 MG/ML IJ SOLN
30.0000 mg | Freq: Once | INTRAMUSCULAR | Status: AC
  Administered 2019-03-11: 30 mg via INTRAVENOUS

## 2019-03-11 MED ORDER — HEMOSTATIC AGENTS (NO CHARGE) OPTIME
TOPICAL | Status: DC | PRN
  Administered 2019-03-11: 1 via TOPICAL

## 2019-03-11 MED ORDER — ACETAMINOPHEN 160 MG/5ML PO SOLN: 325.0000 mg | ORAL | Status: DC | PRN

## 2019-03-11 MED ORDER — IBUPROFEN 800 MG PO TABS: 800.0000 mg | ORAL_TABLET | Freq: Three times a day (TID) | ORAL | 0 refills | Status: DC | PRN

## 2019-03-11 MED ORDER — 0.9 % SODIUM CHLORIDE (POUR BTL) OPTIME
TOPICAL | Status: DC | PRN
  Administered 2019-03-11: 1000 mL

## 2019-03-11 MED ORDER — KETOROLAC TROMETHAMINE 15 MG/ML IJ SOLN: 15.0000 mg | Freq: Four times a day (QID) | INTRAMUSCULAR | Status: DC | PRN

## 2019-03-11 MED ORDER — FENTANYL CITRATE (PF) 100 MCG/2ML IJ SOLN
INTRAMUSCULAR | Status: DC | PRN
  Administered 2019-03-11 (×2): 25 ug via INTRAVENOUS

## 2019-03-11 MED ORDER — ACETAMINOPHEN 500 MG PO TABS
1000.0000 mg | ORAL_TABLET | ORAL | Status: AC
  Administered 2019-03-11: 1000 mg via ORAL

## 2019-03-11 MED ORDER — FENTANYL CITRATE (PF) 100 MCG/2ML IJ SOLN
50.0000 ug | INTRAMUSCULAR | Status: DC | PRN
  Administered 2019-03-11: 100 ug via INTRAVENOUS

## 2019-03-11 MED ORDER — BUPIVACAINE-EPINEPHRINE (PF) 0.25% -1:200000 IJ SOLN
INTRAMUSCULAR | Status: AC
  Filled 2019-03-11: qty 30

## 2019-03-11 MED ORDER — OXYCODONE HCL 5 MG PO TABS: 5.0000 mg | ORAL_TABLET | Freq: Once | ORAL | Status: DC | PRN

## 2019-03-11 MED ORDER — PHENYLEPHRINE 40 MCG/ML (10ML) SYRINGE FOR IV PUSH (FOR BLOOD PRESSURE SUPPORT)
PREFILLED_SYRINGE | INTRAVENOUS | Status: AC
  Filled 2019-03-11: qty 10

## 2019-03-11 MED ORDER — GABAPENTIN 300 MG PO CAPS
ORAL_CAPSULE | ORAL | Status: AC
  Filled 2019-03-11: qty 1

## 2019-03-11 MED ORDER — MEPERIDINE HCL 25 MG/ML IJ SOLN: 6.2500 mg | INTRAMUSCULAR | Status: DC | PRN

## 2019-03-11 SURGICAL SUPPLY — 57 items
APPLIER CLIP 11 MED OPEN (CLIP) ×3
APPLIER CLIP 9.375 MED OPEN (MISCELLANEOUS) ×3
BIOPATCH RED 1 DISK 7.0 (GAUZE/BANDAGES/DRESSINGS) ×2 IMPLANT
BIOPATCH RED 1IN DISK 7.0MM (GAUZE/BANDAGES/DRESSINGS) ×1
BLADE CLIPPER SURG (BLADE) IMPLANT
BLADE SURG 15 STRL LF DISP TIS (BLADE) ×1 IMPLANT
BLADE SURG 15 STRL SS (BLADE) ×2
CANISTER SUCT 1200ML W/VALVE (MISCELLANEOUS) ×3 IMPLANT
CHLORAPREP W/TINT 26 (MISCELLANEOUS) ×3 IMPLANT
CLIP APPLIE 11 MED OPEN (CLIP) ×1 IMPLANT
CLIP APPLIE 9.375 MED OPEN (MISCELLANEOUS) ×1 IMPLANT
COVER BACK TABLE REUSABLE LG (DRAPES) ×3 IMPLANT
COVER MAYO STAND REUSABLE (DRAPES) ×3 IMPLANT
COVER WAND RF STERILE (DRAPES) IMPLANT
DECANTER SPIKE VIAL GLASS SM (MISCELLANEOUS) ×6 IMPLANT
DERMABOND ADVANCED (GAUZE/BANDAGES/DRESSINGS) ×2
DERMABOND ADVANCED .7 DNX12 (GAUZE/BANDAGES/DRESSINGS) ×1 IMPLANT
DRAIN CHANNEL 19F RND (DRAIN) ×3 IMPLANT
DRAPE LAPAROSCOPIC ABDOMINAL (DRAPES) ×3 IMPLANT
DRAPE UTILITY XL STRL (DRAPES) ×3 IMPLANT
DRSG PAD ABDOMINAL 8X10 ST (GAUZE/BANDAGES/DRESSINGS) ×3 IMPLANT
ELECT COATED BLADE 2.86 ST (ELECTRODE) ×3 IMPLANT
ELECT REM PT RETURN 9FT ADLT (ELECTROSURGICAL) ×3
ELECTRODE REM PT RTRN 9FT ADLT (ELECTROSURGICAL) ×1 IMPLANT
EVACUATOR SILICONE 100CC (DRAIN) ×3 IMPLANT
GAUZE SPONGE 4X4 12PLY STRL (GAUZE/BANDAGES/DRESSINGS) ×3 IMPLANT
GLOVE BIOGEL PI IND STRL 8 (GLOVE) ×1 IMPLANT
GLOVE BIOGEL PI INDICATOR 8 (GLOVE) ×2
GLOVE ECLIPSE 8.0 STRL XLNG CF (GLOVE) ×3 IMPLANT
GOWN STRL REUS W/ TWL LRG LVL3 (GOWN DISPOSABLE) ×2 IMPLANT
GOWN STRL REUS W/TWL LRG LVL3 (GOWN DISPOSABLE) ×4
HEMOSTAT ARISTA ABSORB 3G PWDR (HEMOSTASIS) IMPLANT
HEMOSTAT SNOW SURGICEL 2X4 (HEMOSTASIS) ×3 IMPLANT
HEMOSTAT SURGICEL 2X14 (HEMOSTASIS) IMPLANT
NEEDLE HYPO 25X1 1.5 SAFETY (NEEDLE) ×3 IMPLANT
NS IRRIG 1000ML POUR BTL (IV SOLUTION) ×3 IMPLANT
PACK BASIN DAY SURGERY FS (CUSTOM PROCEDURE TRAY) ×3 IMPLANT
PENCIL BUTTON HOLSTER BLD 10FT (ELECTRODE) ×3 IMPLANT
PIN SAFETY STERILE (MISCELLANEOUS) ×3 IMPLANT
SHEET MEDIUM DRAPE 40X70 STRL (DRAPES) ×3 IMPLANT
SLEEVE SCD COMPRESS KNEE MED (MISCELLANEOUS) ×3 IMPLANT
SPONGE LAP 4X18 RFD (DISPOSABLE) ×6 IMPLANT
SUT ETHILON 3 0 PS 1 (SUTURE) ×6 IMPLANT
SUT MNCRL AB 4-0 PS2 18 (SUTURE) ×3 IMPLANT
SUT SILK 3 0 SH 30 (SUTURE) IMPLANT
SUT VIC AB 2-0 SH 27 (SUTURE)
SUT VIC AB 2-0 SH 27XBRD (SUTURE) IMPLANT
SUT VICRYL 3-0 CR8 SH (SUTURE) ×3 IMPLANT
SUT VICRYL AB 2 0 TIE (SUTURE) IMPLANT
SUT VICRYL AB 2 0 TIES (SUTURE)
SYR BULB 3OZ (MISCELLANEOUS) ×3 IMPLANT
SYR CONTROL 10ML LL (SYRINGE) ×3 IMPLANT
TAPE CLOTH SURG 6X10 WHT LF (GAUZE/BANDAGES/DRESSINGS) ×3 IMPLANT
TOWEL GREEN STERILE FF (TOWEL DISPOSABLE) ×3 IMPLANT
TUBE CONNECTING 20'X1/4 (TUBING) ×1
TUBE CONNECTING 20X1/4 (TUBING) ×2 IMPLANT
YANKAUER SUCT BULB TIP NO VENT (SUCTIONS) ×3 IMPLANT

## 2019-03-11 NOTE — Interval H&P Note (Signed)
History and Physical Interval Note:  03/11/2019 9:42 AM  [name]  has presented today for surgery, with the diagnosis of LEFT BREAST CANCER.  The various methods of treatment have been discussed with the patient and family. After consideration of risks, benefits and other options for treatment, the patient has consented to  Procedure(s): LEFT AXILLARY LYMPH NODE DISSECTION (Left) as a surgical intervention.  The patient's history has been reviewed, patient examined, no change in status, stable for surgery.  I have reviewed the patient's chart and labs.  Questions were answered to the patient's satisfaction.     [place]

## 2019-03-11 NOTE — Op Note (Signed)
Preoperative diagnosis: Stage II left breast cancer  Postoperative diagnosis: Same  Procedure: Completion left axillary lymph node dissection  Surgeon: Erroll Luna, MD  Anesthesia: LMA with local and regional block per anesthesia  EBL: 20 cc  Specimen: Left axillary contents to pathology  Drains: 19 round  IV fluids: Per anesthesia record  Indications for procedure: The patient is a 58 year old female who underwent neoadjuvant chemotherapy for stage II left breast cancer.  She underwent lumpectomy and targeted lymph node dissection which showed multiple nodes with residual cancer after chemotherapy.  She presents for completion node dissection to clear the axilla for gross disease and to reduce recurrence risk. lymph node dissection has been discussed with the patient.  Risk of bleeding,  Infection,  Seroma formation,  Additional procedures,,  Shoulder weakness ,  Shoulder stiffness,  Nerve and blood vessel injury and reaction to the mapping dyes have been discussed.  Alternatives to surgery have been discussed with the patient.  The patient agrees to proceed.  Description of procedure: Patient was met in the holding area and questions were answered.  Left side was marked as correct.  Her left axilla wound was open draining some mild serous fluid but no gross infection noted.  She underwent a block per anesthesia.  She was then taken back the operating placed upon upon the OR table.  After induction of general esthesia, left axilla and breast were prepped and draped in sterile fashion timeout was done she received 3 g of Ancef.  Of note the incision had a small opening.  This is draining serous fluid.  There is no redness or pus.  The wound was opened in its entire length.  The axillary contents were accessed.  Dissection was performed mobilizing the long thoracic nerve, the thoracodorsal trunk and axillary vein.  All structures were skeletonized and preserved.  All lymphovascular tissue  between these landmarks was excised using clips and cautery for hemostasis.  Hemostasis was achieved.  The axillary vein, long thoracic nerve and thoracodorsal trunk were all preserved.  Surgicel snow was placed.  Through a separate stab incision around 19 drain was placed and secured the skin with 2-0 nylon.  Irrigation was then performed and found to be clear.  There is no ongoing bleeding.  Wound closed with 3-0 Vicryl for Monocryl.  Dermabond applied.  Dry dressings applied.  All final counts found to be correct.  The patient was awoke extubated taken to recovery in satisfactory condition.

## 2019-03-11 NOTE — Anesthesia Procedure Notes (Signed)
Anesthesia Regional Block: Pectoralis block   Pre-Anesthetic Checklist: ,, timeout performed, Correct Patient, Correct Site, Correct Laterality, Correct Procedure, Correct Position, site marked, Risks and benefits discussed,  Surgical consent,  Pre-op evaluation,  At surgeon's request and post-op pain management  Laterality: Left  Prep: chloraprep       Needles:  Injection technique: Single-shot  Needle Type: Echogenic Stimulator Needle     Needle Length: 5cm  Needle Gauge: 22     Additional Needles:   Procedures:, nerve stimulator,,, ultrasound used (permanent image in chart),,,,   Nerve Stimulator or Paresthesia:  Response: 0.45 mA,   Additional Responses:   Narrative:  Start time: 03/11/2019 9:10 AM End time: 03/11/2019 9:15 AM Injection made incrementally with aspirations every 5 mL.  Performed by: Personally  Anesthesiologist: Janeece Riggers, MD  Additional Notes: Functioning IV was confirmed and monitors were applied.  A 36mm 22ga Arrow echogenic stimulator needle was used. Sterile prep and drape,hand hygiene and sterile gloves were used. Ultrasound guidance: relevant anatomy identified, needle position confirmed, local anesthetic spread visualized around nerve(s)., vascular puncture avoided.  Image printed for medical record. Negative aspiration and negative test dose prior to incremental administration of local anesthetic. The patient tolerated the procedure well.

## 2019-03-11 NOTE — Anesthesia Procedure Notes (Signed)
Procedure Name: LMA Insertion Date/Time: 03/11/2019 9:55 AM Performed by: Genelle Bal, CRNA Pre-anesthesia Checklist: Patient identified, Emergency Drugs available, Suction available and Patient being monitored Patient Re-evaluated:Patient Re-evaluated prior to induction Oxygen Delivery Method: Circle system utilized Preoxygenation: Pre-oxygenation with 100% oxygen Induction Type: IV induction Ventilation: Mask ventilation without difficulty LMA: LMA inserted LMA Size: 4.0 Number of attempts: 1 Airway Equipment and Method: Bite block Placement Confirmation: positive ETCO2 Tube secured with: Tape Dental Injury: Teeth and Oropharynx as per pre-operative assessment

## 2019-03-11 NOTE — Progress Notes (Signed)
Assisted Dr. Oddono with left, ultrasound guided, pectoralis block. Side rails up, monitors on throughout procedure. See vital signs in flow sheet. Tolerated Procedure well. °

## 2019-03-11 NOTE — Anesthesia Preprocedure Evaluation (Signed)
Anesthesia Evaluation  Patient identified by MRN, date of birth, ID band Patient awake    Reviewed: Allergy & Precautions, H&P , NPO status , Patient's Chart, lab work & pertinent test results, reviewed documented beta blocker date and time   History of Anesthesia Complications (+) PONVNegative for: history of anesthetic complications  Airway Mallampati: II  TM Distance: >3 FB Neck ROM: Full    Dental no notable dental hx.    Pulmonary neg pulmonary ROS,    Pulmonary exam normal        Cardiovascular hypertension, Pt. on medications and Pt. on home beta blockers Normal cardiovascular exam     Neuro/Psych  Headaches, negative psych ROS   GI/Hepatic Neg liver ROS, GERD  Medicated,  Endo/Other  negative endocrine ROS  Renal/GU negative Renal ROS  negative genitourinary   Musculoskeletal negative musculoskeletal ROS (+)   Abdominal   Peds  Hematology negative hematology ROS (+)   Anesthesia Other Findings Unremarkable echo 10/2018 COVID negative 02/18/19  Reproductive/Obstetrics                             Anesthesia Physical  Anesthesia Plan  ASA: III  Anesthesia Plan: General   Post-op Pain Management: GA combined w/ Regional for post-op pain   Induction: Intravenous  PONV Risk Score and Plan: 4 or greater and Ondansetron, Dexamethasone, Midazolam, Treatment may vary due to age or medical condition and Scopolamine patch - Pre-op  Airway Management Planned: LMA  Additional Equipment: None  Intra-op Plan:   Post-operative Plan: Extubation in OR  Informed Consent: I have reviewed the patients History and Physical, chart, labs and discussed the procedure including the risks, benefits and alternatives for the proposed anesthesia with the patient or authorized representative who has indicated his/her understanding and acceptance.     Dental advisory given  Plan Discussed with:    Anesthesia Plan Comments:         Anesthesia Quick Evaluation

## 2019-03-11 NOTE — Transfer of Care (Signed)
Immediate Anesthesia Transfer of Care Note  Patient: [name]  Procedure(s) Performed: LEFT AXILLARY LYMPH NODE DISSECTION (Left Breast)  Patient Location: PACU  Anesthesia Type:General  Level of Consciousness: drowsy and patient cooperative  Airway & Oxygen Therapy: Patient Spontanous Breathing and Patient connected to nasal cannula oxygen  Post-op Assessment: Report given to RN and Post -op Vital signs reviewed and stable  Post vital signs: Reviewed and stable  Last Vitals:  Vitals Value Taken Time  BP    Temp    Pulse 132 03/11/2019 11:18 AM  Resp 19 03/11/2019 11:18 AM  SpO2 100 % 03/11/2019 11:18 AM  Vitals shown include unvalidated device data.  Last Pain:  Vitals:   03/11/19 0842  TempSrc: Oral  PainSc: 0-No pain         Complications: No apparent anesthesia complications

## 2019-03-12 DIAGNOSIS — C773 Secondary and unspecified malignant neoplasm of axilla and upper limb lymph nodes: Secondary | ICD-10-CM | POA: Diagnosis not present

## 2019-03-12 NOTE — Discharge Instructions (Signed)
About my Jackson-Pratt Bulb Drain  What is a Jackson-Pratt bulb? A Jackson-Pratt is a soft, round device used to collect drainage. It is connected to a long, thin drainage catheter, which is held in place by one or two small stiches near your surgical incision site. When the bulb is squeezed, it forms a vacuum, forcing the drainage to empty into the bulb.  Emptying the Jackson-Pratt bulb- To empty the bulb: 1. Release the plug on the top of the bulb. 2. Pour the bulb's contents into a measuring container which your nurse will provide. 3. Record the time emptied and amount of drainage. Empty the drain(s) as often as your     doctor or nurse recommends.  Date                  Time                    Amount (Drain 1)                 Amount (Drain 2)  _____________________________________________________________________  _____________________________________________________________________  _____________________________________________________________________  _____________________________________________________________________  _____________________________________________________________________  _____________________________________________________________________  _____________________________________________________________________  _____________________________________________________________________  Squeezing the Jackson-Pratt Bulb- To squeeze the bulb: 1. Make sure the plug at the top of the bulb is open. 2. Squeeze the bulb tightly in your fist. You will hear air squeezing from the bulb. 3. Replace the plug while the bulb is squeezed. 4. Use a safety pin to attach the bulb to your clothing. This will keep the catheter from     pulling at the bulb insertion site.  When to call your doctor- Call your doctor if:  Drain site becomes red, swollen or hot.  You have a fever greater than 101 degrees F.  There is oozing at the drain site.  Drain falls out (apply a guaze  bandage over the drain hole and secure it with tape).  Drainage increases daily not related to activity patterns. (You will usually have more drainage when you are active than when you are resting.)  Drainage has a bad odor.    If you had a scopolamine patch placed behind your ear for the management of post- operative nausea and/or vomiting:  1. The medication in the patch is effective for 72 hours, after which it should be removed.  Wrap patch in a tissue and discard in the trash. Wash hands thoroughly with soap and water. 2. You may remove the patch earlier than 72 hours if you experience unpleasant side effects which may include dry mouth, dizziness or visual disturbances. 3. Avoid touching the patch. Wash your hands with soap and water after contact with the patch.

## 2019-03-15 ENCOUNTER — Encounter (HOSPITAL_BASED_OUTPATIENT_CLINIC_OR_DEPARTMENT_OTHER): Payer: Self-pay | Admitting: Surgery

## 2019-03-17 ENCOUNTER — Other Ambulatory Visit: Payer: BC Managed Care – PPO

## 2019-03-17 ENCOUNTER — Ambulatory Visit: Payer: BC Managed Care – PPO

## 2019-03-21 ENCOUNTER — Encounter: Payer: Self-pay | Admitting: *Deleted

## 2019-03-21 NOTE — Anesthesia Postprocedure Evaluation (Signed)
Anesthesia Post Note  Patient: [name]  Procedure(s) Performed: LEFT AXILLARY LYMPH NODE DISSECTION (Left Breast)     Patient location during evaluation: PACU Anesthesia Type: General Level of consciousness: awake and alert Pain management: pain level controlled Vital Signs Assessment: post-procedure vital signs reviewed and stable Respiratory status: spontaneous breathing, nonlabored ventilation, respiratory function stable and patient connected to nasal cannula oxygen Cardiovascular status: blood pressure returned to baseline and stable Postop Assessment: no apparent nausea or vomiting Anesthetic complications: no    Last Vitals:  Vitals:   03/12/19 0500 03/12/19 0600  BP:  102/70  Pulse:  74  Resp:  18  Temp:  (!) 36.3 C  SpO2: 100% 99%    Last Pain:  Vitals:   03/12/19 0600  TempSrc:   PainSc: 0-No pain   Pain Goal: Patients Stated Pain Goal: 2 (03/11/19 1230)                 Micco Bourbeau

## 2019-03-22 ENCOUNTER — Ambulatory Visit: Payer: BC Managed Care – PPO | Admitting: Radiation Oncology

## 2019-03-22 ENCOUNTER — Encounter: Payer: Self-pay | Admitting: General Practice

## 2019-03-22 ENCOUNTER — Ambulatory Visit: Payer: BC Managed Care – PPO

## 2019-03-22 NOTE — Progress Notes (Signed)
[place] CSW Progress Notes  Call to patient at request of nurse navigator, patient interested in Look Good, Feel Better program.  Called patient, she is interested in in person support - currently all [facility] programming is offered virtually.  Sent calendar of activities, discussed coping strategies for dealing with diagnosis and treatment, referred to chaplain for in person support as she has scheduled infusion treatments on day CSW is not at [facility].  Encouraged to contact undersigned as needed for support/resources.    Edwyna Shell, LCSW Clinical Social Worker Phone:  [phone]

## 2019-03-23 ENCOUNTER — Other Ambulatory Visit: Payer: Self-pay

## 2019-03-23 ENCOUNTER — Encounter: Payer: Self-pay | Admitting: Rehabilitation

## 2019-03-23 ENCOUNTER — Ambulatory Visit: Payer: BC Managed Care – PPO | Attending: Surgery | Admitting: Rehabilitation

## 2019-03-23 ENCOUNTER — Encounter: Payer: Self-pay | Admitting: General Practice

## 2019-03-23 DIAGNOSIS — M25611 Stiffness of right shoulder, not elsewhere classified: Secondary | ICD-10-CM | POA: Diagnosis present

## 2019-03-23 DIAGNOSIS — R293 Abnormal posture: Secondary | ICD-10-CM | POA: Insufficient documentation

## 2019-03-23 DIAGNOSIS — Z170 Estrogen receptor positive status [ER+]: Secondary | ICD-10-CM | POA: Insufficient documentation

## 2019-03-23 DIAGNOSIS — M25511 Pain in right shoulder: Secondary | ICD-10-CM | POA: Diagnosis present

## 2019-03-23 DIAGNOSIS — C50412 Malignant neoplasm of upper-outer quadrant of left female breast: Secondary | ICD-10-CM | POA: Diagnosis present

## 2019-03-23 NOTE — Progress Notes (Signed)
[place] CSW Progress Notes  Linda Hedges assigned as Network engineer, has already made contact w her.  Edwyna Shell, LCSW Clinical Social Worker Phone:  [phone]

## 2019-03-23 NOTE — Patient Instructions (Signed)
Standing dowel flexion, behind the head stretch, scapular retractions on medbridge but access code did not save

## 2019-03-23 NOTE — Therapy (Signed)
[place], [place], [address] Phone: [phone]   Fax:  [phone]  Physical Therapy Evaluation  Patient Details  Name: [name] MRN: [id number] Date of Birth: 03-09-1961 Referring Provider (PT): Dr. Brantley Stage   Encounter Date: 03/23/2019  PT End of Session - 03/23/19 1745    Visit Number  1    Number of Visits  9    Date for PT Re-Evaluation  04/27/19    Authorization Type  no limit    PT Start Time  1300    PT Stop Time  1345    PT Time Calculation (min)  45 min    Activity Tolerance  Patient tolerated treatment well    Behavior During Therapy  [facility] for tasks assessed/performed       Past Medical History:  Diagnosis Date  . Cancer [facility])    breast- left  . Complication of anesthesia   . Fatigue   . GERD (gastroesophageal reflux disease)    w nonobstructing esophageal stricture  . Headache   . Heat intolerance   . History of ETT 6/10   myoview EF 65%, normal wall motion, no ischemia or infarction, poor exercise capacity  . HTN (hypertension)   . Motion sickness   . Normal echocardiogram 6/10   EF 95-63% moderate diastolic dysfunction, mild LAE  . Numbness and tingling   . Obesity   . PONV (postoperative nausea and vomiting)   . S/P partial hysterectomy     Past Surgical History:  Procedure Laterality Date  . ABDOMINAL HYSTERECTOMY  2007   partial   . AXILLARY LYMPH NODE DISSECTION Left 03/11/2019   Procedure: LEFT AXILLARY LYMPH NODE DISSECTION;  Surgeon: Erroll Luna, MD;  Location: [place];  Service: General;  Laterality: Left;  . BREAST LUMPECTOMY WITH RADIOACTIVE SEED AND SENTINEL LYMPH NODE BIOPSY Left 02/22/2019   Procedure: LEFT BREAST LUMPECTOMY x2 WITH RADIOACTIVE SEED AND LEFT AXILLA SEED  GUIDED LYMPH NODE BIOPSY AND LEFT SENTINEL LYMPH NODE MAPPING;  Surgeon: Erroll Luna, MD;  Location: [place];  Service: General;   Laterality: Left;  . CARPAL TUNNEL RELEASE Bilateral   . FOOT SURGERY Bilateral   . hysterectomy (other)    . PORT-A-CATH REMOVAL N/A 02/22/2019   Procedure: REMOVAL PORT-A-CATH;  Surgeon: Erroll Luna, MD;  Location: [place];  Service: General;  Laterality: N/A;  . PORTACATH PLACEMENT N/A 11/10/2018   Procedure: INSERTION PORT-A-CATH WITH ULTRASOUND;  Surgeon: Erroll Luna, MD;  Location: [place];  Service: General;  Laterality: N/A;  . TONSILLECTOMY  1968    There were no vitals filed for this visit.   Subjective Assessment - 03/23/19 1303    Subjective  last week was rough due to pain I had to be admitted to the hospital due to the pain.  Got the drain out 2 days ago.  I have been working the arm out since then.      Pertinent History  Left breast stage IIA ER/PR positive, HER2 negative with neoadjuvant chemotherapy 11/11/17-12/20/18 with Adriamycin and Cytoxan stopping due to neuropathy.  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  ALND 03/11/19 by Dr. Brantley Stage to clear axillary contents. HTN,  Radiation will start 03/29/19    Limitations  Lifting    Patient Stated Goals  do everything I can do     Currently in Pain?  Yes    Pain Score  7    at the  drain site   Pain Location  Axilla    Pain Orientation  Left    Pain Descriptors / Indicators  Aching    Pain Type  Surgical pain    Pain Onset  1 to 4 weeks ago    Pain Frequency  Intermittent    Aggravating Factors   reaching and using it    Pain Relieving Factors  rest          [facility] PT Assessment - 03/23/19 0001      Assessment   Medical Diagnosis  Lt breast cancer    Referring Provider (PT)  Dr. Brantley Stage    Onset Date/Surgical Date  03/11/19    Hand Dominance  Right    Prior Therapy  no      Precautions   Precaution Comments  lymphedema, cancer history      Restrictions   Weight Bearing Restrictions  No      Balance Screen   Has the patient fallen in the past 6 months  No    Has the  patient had a decrease in activity level because of a fear of falling?   No    Is the patient reluctant to leave their home because of a fear of falling?   No      Home Film/video editor residence    Living Arrangements  Spouse/significant other    Available Help at Discharge  Family      Prior Function   Level of Independence  Independent    Vocation  Full time employment    Equities trader    Leisure  very active in general with her family      Cognition   Overall Cognitive Status  Within Functional Limits for tasks assessed      Observation/Other Assessments   Observations  healing incision from ALND with a small open spot from after the glue was removed estimated size of 1cmx1cm with sloughy borders pt reports Dr. Brantley Stage was okay with the spot recently       Posture/Postural Control   Posture/Postural Control  Postural limitations    Postural Limitations  Rounded Shoulders;Forward head      ROM / Strength   AROM / PROM / Strength  AROM;PROM      AROM   AROM Assessment Site  Shoulder    Right/Left Shoulder  Right;Left    Right Shoulder Extension  60 Degrees    Right Shoulder Flexion  152 Degrees    Right Shoulder ABduction  171 Degrees    Right Shoulder Internal Rotation  80 Degrees    Right Shoulder External Rotation  85 Degrees    Left Shoulder Extension  60 Degrees    Left Shoulder Flexion  155 Degrees    Left Shoulder ABduction  146 Degrees   sore   Left Shoulder Internal Rotation  85 Degrees    Left Shoulder External Rotation  80 Degrees      PROM   Overall PROM Comments  able to get into radiation position without pain or discomfort        LYMPHEDEMA/ONCOLOGY QUESTIONNAIRE - 03/23/19 1318      Type   Cancer Type  Left breast cancer      Surgeries   Lumpectomy Date  02/22/19    Axillary Lymph Node Dissection Date  03/11/19    Number Lymph Nodes Removed  --   axillary contents with nodes positive per patient  Treatment   Active Chemotherapy Treatment  No    Past Chemotherapy Treatment  Yes    Active Radiation Treatment  No    Past Radiation Treatment  No    Current Hormone Treatment  No    Past Hormone Therapy  No      What other symptoms do you have   Are you Having Heaviness or Tightness  No    Are you having Pain  Yes      Lymphedema Assessments   Lymphedema Assessments  Upper extremities      Right Upper Extremity Lymphedema   15 cm Proximal to Olecranon Process  36.8 cm    10 cm Proximal to Olecranon Process  35 cm    Olecranon Process  30 cm    15 cm Proximal to Ulnar Styloid Process  27.9 cm    10 cm Proximal to Ulnar Styloid Process  23.5 cm    Just Proximal to Ulnar Styloid Process  18.8 cm    Across Hand at [employer]  22.5 cm    At [place] of 2nd Digit  7.4 cm      Left Upper Extremity Lymphedema   15 cm Proximal to Olecranon Process  35.9 cm    10 cm Proximal to Olecranon Process  33.7 cm    Olecranon Process  31 cm    15 cm Proximal to Ulnar Styloid Process  27.2 cm    10 cm Proximal to Ulnar Styloid Process  23.7 cm    Just Proximal to Ulnar Styloid Process  19.3 cm    Across Hand at [employer]  22.8 cm    At [place] of 2nd Digit  7.3 cm          Quick Dash - 03/23/19 0001    Open a tight or new jar  Moderate difficulty    Do heavy household chores (wash walls, wash floors)  Moderate difficulty    Carry a shopping bag or briefcase  Mild difficulty    Wash your back  Moderate difficulty    Use a knife to cut food  No difficulty    Recreational activities in which you take some force or impact through your arm, shoulder, or hand (golf, hammering, tennis)  Moderate difficulty    During the past week, to what extent has your arm, shoulder or hand problem interfered with your normal social activities with family, friends, neighbors, or groups?  Modererately    During the past week, to what extent has your arm, shoulder or hand problem limited your work or other  regular daily activities  Quite a bit    Arm, shoulder, or hand pain.  Severe    Tingling (pins and needles) in your arm, shoulder, or hand  Moderate    Difficulty Sleeping  No difficulty    DASH Score  43.18 %        Objective measurements completed on examination: See above findings.              PT Education - 03/23/19 1744    Education Details  HEP, POC    Person(s) Educated  Patient    Methods  Explanation;Demonstration;Tactile cues;Verbal cues;Handout    Comprehension  Verbalized understanding;Returned demonstration;Verbal cues required          PT Long Term Goals - 03/23/19 1750      PT LONG TERM GOAL #1   Title  Pt will be educated on lymphedema risk reduction and where  to obtain a compression sleeve for flying     Baseline  lymphedema education provided    Time  4    Period  Weeks    Status  Partially Met      PT LONG TERM GOAL #2   Title  Pt will improve Rt shoulder abduction to 170 to meet the opposite side    Baseline  146    Time  4    Period  Weeks    Status  New      PT LONG TERM GOAL #3   Title  Pt will be ind with stretches and strengthening to perform during radiation and after to maximize mobility    Time  4    Period  Weeks    Status  New             Plan - 03/23/19 1746    Clinical Impression Statement  Pt presents today with decreased shoulder PROM, axillar and arm pain, and decreased UE mobility after ALND on 03/11/19.  Pt got her drain out 2 days ago and has been moving better since then.  Pt demonstrates decrease shoulder AROM, a small open area at the incision so pt was instructed to perform HEP looking in the mirror to watch for pulling, the need for lymphedema education, and decreased strength.      Examination-Activity Limitations  Lift;Reach Overhead    Examination-Participation Restrictions  Laundry;Cleaning;Meal Prep;Yard Work    Stability/Clinical Decision Making  Stable/Uncomplicated    Designer, jewellery  Low     Rehab Potential  Excellent    PT Frequency  2x / week    PT Duration  4 weeks    PT Treatment/Interventions  ADLs/Self Care Home Management;Therapeutic exercise;Manual lymph drainage;Manual techniques;Patient/family education;Passive range of motion    PT Next Visit Plan  how was CT simulation, check shoulder ROM, give information on ordering sleeve for the future (pt has prescription), check wound in axilla, shoulder AAROM exercise advancement working toward strength ABC    PT Home Exercise Plan  dowel rod flexion, behind the head stretch, scapular retractions    Recommended Other Services  compression sleeve, pt has prescription       Patient will benefit from skilled therapeutic intervention in order to improve the following deficits and impairments:  Decreased skin integrity, Pain, Impaired UE functional use, Decreased strength, Decreased knowledge of use of DME, Decreased knowledge of precautions, Decreased range of motion  Visit Diagnosis: Malignant neoplasm of upper-outer quadrant of left breast in female, estrogen receptor positive (HCC)  Abnormal posture  Stiffness of right shoulder, not elsewhere classified  Acute pain of right shoulder     Problem List Patient Active Problem List   Diagnosis Date Noted  . Breast cancer, left breast ([place]) 03/11/2019  . Leukocytosis 12/28/2018  . Dehydration 12/28/2018  . Nausea vomiting and diarrhea 12/28/2018  . Chemotherapy induced nausea and vomiting 12/28/2018  . Non-intractable vomiting 12/27/2018  . Port-A-Cath in place 11/18/2018  . Malignant neoplasm of upper-outer quadrant of left breast in female, estrogen receptor positive ([place]) 10/26/2018  . Multiple neurological symptoms 03/31/2017  . Pain in back 02/21/2014  . Female stress incontinence 02/21/2014  . Carbuncle 02/21/2014  . Symptomatic menopausal or female climacteric states 02/21/2014  . CONGESTIVE HEART FAILURE 03/22/2009  . CHEST PAIN-PRECORDIAL 03/22/2009  .  Essential hypertension 07/31/2008  . ABDOMINAL PAIN, UNSPECIFIED SITE 07/31/2008    Shan Levans, PT 03/23/2019, 5:52 PM  [place]  [place], [place], [address] Phone: [phone]   Fax:  [phone]  Name: [name] [facility] MRN: [id number] Date of Birth: 1961/09/02

## 2019-03-28 ENCOUNTER — Encounter: Payer: Self-pay | Admitting: *Deleted

## 2019-03-28 ENCOUNTER — Telehealth: Payer: Self-pay | Admitting: Radiation Oncology

## 2019-03-28 ENCOUNTER — Other Ambulatory Visit: Payer: Self-pay | Admitting: Hematology and Oncology

## 2019-03-28 NOTE — Telephone Encounter (Signed)
Contacted pt to verify webex visit for pre reg °

## 2019-03-29 ENCOUNTER — Other Ambulatory Visit: Payer: Self-pay

## 2019-03-29 ENCOUNTER — Ambulatory Visit
Admission: RE | Admit: 2019-03-29 | Discharge: 2019-03-29 | Disposition: A | Discharge status: Home / Self Care | Payer: BC Managed Care – PPO | Source: Ambulatory Visit | Attending: Radiation Oncology | Admitting: Radiation Oncology

## 2019-03-29 ENCOUNTER — Ambulatory Visit: Payer: BC Managed Care – PPO

## 2019-03-29 ENCOUNTER — Encounter: Payer: Self-pay | Admitting: General Practice

## 2019-03-29 ENCOUNTER — Encounter: Payer: Self-pay | Admitting: Radiation Oncology

## 2019-03-29 VITALS — Wt 271.0 lb

## 2019-03-29 DIAGNOSIS — Z510 Encounter for antineoplastic radiation therapy: Secondary | ICD-10-CM | POA: Diagnosis not present

## 2019-03-29 DIAGNOSIS — C50412 Malignant neoplasm of upper-outer quadrant of left female breast: Secondary | ICD-10-CM | POA: Insufficient documentation

## 2019-03-29 DIAGNOSIS — Z170 Estrogen receptor positive status [ER+]: Secondary | ICD-10-CM

## 2019-03-29 DIAGNOSIS — R293 Abnormal posture: Secondary | ICD-10-CM

## 2019-03-29 DIAGNOSIS — M25511 Pain in right shoulder: Secondary | ICD-10-CM

## 2019-03-29 DIAGNOSIS — M25611 Stiffness of right shoulder, not elsewhere classified: Secondary | ICD-10-CM

## 2019-03-29 NOTE — Therapy (Signed)
[place], [place], [address] Phone: [phone]   Fax:  [phone]  Physical Therapy Treatment  Patient Details  Name: [name] MRN: [id number] Date of Birth: 1961/05/30 Referring Provider (PT): Dr. Brantley Stage   Encounter Date: 03/29/2019  PT End of Session - 03/29/19 1205    Visit Number  2    Number of Visits  9    Date for PT Re-Evaluation  04/27/19    PT Start Time  1106    PT Stop Time  1155    PT Time Calculation (min)  49 min    Activity Tolerance  Patient tolerated treatment well    Behavior During Therapy  [facility] for tasks assessed/performed       Past Medical History:  Diagnosis Date  . Cancer [facility])    breast- left  . Complication of anesthesia   . Fatigue   . GERD (gastroesophageal reflux disease)    w nonobstructing esophageal stricture  . Headache   . Heat intolerance   . History of ETT 6/10   myoview EF 65%, normal wall motion, no ischemia or infarction, poor exercise capacity  . HTN (hypertension)   . Motion sickness   . Normal echocardiogram 6/10   EF 91-66% moderate diastolic dysfunction, mild LAE  . Numbness and tingling   . Obesity   . PONV (postoperative nausea and vomiting)   . S/P partial hysterectomy     Past Surgical History:  Procedure Laterality Date  . ABDOMINAL HYSTERECTOMY  2007   partial   . AXILLARY LYMPH NODE DISSECTION Left 03/11/2019   Procedure: LEFT AXILLARY LYMPH NODE DISSECTION;  Surgeon: Erroll Luna, MD;  Location: [place];  Service: General;  Laterality: Left;  . BREAST LUMPECTOMY WITH RADIOACTIVE SEED AND SENTINEL LYMPH NODE BIOPSY Left 02/22/2019   Procedure: LEFT BREAST LUMPECTOMY x2 WITH RADIOACTIVE SEED AND LEFT AXILLA SEED  GUIDED LYMPH NODE BIOPSY AND LEFT SENTINEL LYMPH NODE MAPPING;  Surgeon: Erroll Luna, MD;  Location: [place];  Service: General;  Laterality: Left;  . CARPAL TUNNEL  RELEASE Bilateral   . FOOT SURGERY Bilateral   . hysterectomy (other)    . PORT-A-CATH REMOVAL N/A 02/22/2019   Procedure: REMOVAL PORT-A-CATH;  Surgeon: Erroll Luna, MD;  Location: [place];  Service: General;  Laterality: N/A;  . PORTACATH PLACEMENT N/A 11/10/2018   Procedure: INSERTION PORT-A-CATH WITH ULTRASOUND;  Surgeon: Erroll Luna, MD;  Location: [place];  Service: General;  Laterality: N/A;  . TONSILLECTOMY  1968    There were no vitals filed for this visit.  Subjective Assessment - 03/29/19 1109    Subjective  I'm doing really good. Not sure I even need therapy. But I've been doing the HEP and it's going fine.    Pertinent History  Left breast stage IIA ER/PR positive, HER2 negative with neoadjuvant chemotherapy 11/11/17-12/20/18 with Adriamycin and Cytoxan stopping due to neuropathy.  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  ALND 03/11/19 by Dr. Brantley Stage to clear axillary contents. HTN,  Radiation will start 03/29/19    Patient Stated Goals  do everything I can do     Currently in Pain?  No/denies                       [facility] Adult PT Treatment/Exercise - 03/29/19 0001      Exercises   Exercises  Other Exercises    Other Exercises  Began instruction of Strength ABC Program getting through all of stretches      Manual Therapy   Manual Therapy  Passive ROM    Passive ROM  In Supine to Lt shoulder into flexion, abdution and D2 to pts end ROM                  PT Long Term Goals - 03/23/19 1750      PT LONG TERM GOAL #1   Title  Pt will be educated on lymphedema risk reduction and where to obtain a compression sleeve for flying     Baseline  lymphedema education provided    Time  4    Period  Weeks    Status  Partially Met      PT LONG TERM GOAL #2   Title  Pt will improve Rt shoulder abduction to 170 to meet the opposite side    Baseline  146    Time  4    Period  Weeks    Status  New      PT LONG TERM  GOAL #3   Title  Pt will be ind with stretches and strengthening to perform during radiation and after to maximize mobility    Time  4    Period  Weeks    Status  New            Plan - 03/29/19 1206    Clinical Impression Statement  Began P/ROM to Lt shoulder but pt had very limited tightness and full ROM so began instructing pt in Strength ABC packet.She tolerated this well and will be ready to learn rest of Strength ABC at next session. Pt is going to [place] after leaving here to be measured for compression sleeve.    Examination-Activity Limitations  Lift;Reach Overhead    Examination-Participation Restrictions  Laundry;Cleaning;Meal Prep;Yard Work    Stability/Clinical Decision Making  Stable/Uncomplicated    Rehab Potential  Excellent    PT Frequency  2x / week    PT Duration  4 weeks    PT Treatment/Interventions  ADLs/Self Care Home Management;Therapeutic exercise;Manual lymph drainage;Manual techniques;Patient/family education;Passive range of motion    PT Next Visit Plan  how was CT simulation, check shoulder ROM, check wound in axilla, finalize instruction of strength ABC (starting with core and issue packet)    Consulted and Agree with Plan of Care  Patient       Patient will benefit from skilled therapeutic intervention in order to improve the following deficits and impairments:  Decreased skin integrity, Pain, Impaired UE functional use, Decreased strength, Decreased knowledge of use of DME, Decreased knowledge of precautions, Decreased range of motion  Visit Diagnosis: 1. Malignant neoplasm of upper-outer quadrant of left breast in female, estrogen receptor positive ([place])   2. Abnormal posture   3. Stiffness of right shoulder, not elsewhere classified   4. Acute pain of right shoulder        Problem List Patient Active Problem List   Diagnosis Date Noted  . Breast cancer, left breast ([place]) 03/11/2019  . Leukocytosis 12/28/2018  . Dehydration  12/28/2018  . Nausea vomiting and diarrhea 12/28/2018  . Chemotherapy induced nausea and vomiting 12/28/2018  . Non-intractable vomiting 12/27/2018  . Port-A-Cath in place 11/18/2018  . Malignant neoplasm of upper-outer quadrant of left breast in female, estrogen receptor positive ([place]) 10/26/2018  . Multiple neurological symptoms 03/31/2017  . Pain in back 02/21/2014  . Female stress incontinence 02/21/2014  .  Carbuncle 02/21/2014  . Symptomatic menopausal or female climacteric states 02/21/2014  . CONGESTIVE HEART FAILURE 03/22/2009  . CHEST PAIN-PRECORDIAL 03/22/2009  . Essential hypertension 07/31/2008  . ABDOMINAL PAIN, UNSPECIFIED SITE 07/31/2008    Otelia Limes, PTA 03/29/2019, 12:21 PM  [place], [place], [address] Phone: [phone]   Fax:  [phone]  Name: [name] MRN: [id number] Date of Birth: 1960-12-07

## 2019-03-29 NOTE — Progress Notes (Signed)
Radiation Oncology         [phone] ________________________________   Name: [name]        MRN: [id number]  Date of Service: 03/29/2019 DOB: February 04, 1961  WU:[id number], Joelene Millin, NP  Nicholas Lose, MD     REFERRING PHYSICIAN: Nicholas Lose, MD   DIAGNOSIS: The encounter diagnosis was Malignant neoplasm of upper-outer quadrant of left breast in female, estrogen receptor positive ([place]).   HISTORY OF PRESENT ILLNESS: [name] is a 58 y.o. female originally seen in the multidisciplinary breast clinic for a new diagnosis of left breast cancer. The patient was noted to have screening detected architectural abnormality measuring 4.5 cm in the left breast with a suspicious appearing lymph node. Diagnostic imaging revealed an area measuring 5 cm in the 1:00 position of the left breast with at least 4 abnormal appearing axillary nodes, and one intramammary node that was abnormal in appearance. 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 Her tumor was ER/PR positive, HER2 negative with a Ki 67 of 20%. Her intramammary node was also sampled and negative for cancer. She did have an MRI on 1/16/2020That revealed concern for for level 1 left axillary lymph nodes, and her left breast nonmass enhancement measuring 5 x 3 x 10 cm.  She went on to receive neoadjuvant chemotherapy and received her 4 cycles of Adriamycin and Cytoxan x 4 cycles but her taxol was held after 3 cycles due to severe peripheral neuropathy.  She proceeded with an MRI of the breast revealing the tumor and non-mass enhancement measuring 4.7 x 3.5 x 7.2 cm.  The previously seen lymph nodes have resolved.  She proceeded on 02/22/2019 with left breast lumpectomy x2 and left axillary sentinel lymph node biopsy.  Final pathology revealed an invasive ductal carcinoma status post neoadjuvant  treatment her margins were negative with the closest being less than 1 mm from the medial and posterior margins with associated DCIS LVSI, and of the 5 lymph lymph nodes examined, 3 contained metastatic disease.  She was taken back to the operating room on 03/11/2019 for further dissection, and of the 14 lymph nodes sampled, 3 of which contained disease with focal extranodal involvement by tumor.  She plans to begin antiestrogen therapy with Dr. Lindi Adie but is seen today to discuss options of adjuvant radiotherapy.    PREVIOUS RADIATION THERAPY: No   PAST MEDICAL HISTORY:  Past Medical History:  Diagnosis Date   Cancer ([place])    breast- left   Complication of anesthesia    Fatigue    GERD (gastroesophageal reflux disease)    w nonobstructing esophageal stricture   Headache    Heat intolerance    History of ETT 6/10   myoview EF 65%, normal wall motion, no ischemia or infarction, poor exercise capacity   HTN (hypertension)    Motion sickness    Normal echocardiogram 6/10   EF 82-95% moderate diastolic dysfunction, mild LAE   Numbness and tingling    Obesity    PONV (postoperative nausea and vomiting)    S/P partial hysterectomy        PAST SURGICAL HISTORY: Past Surgical History:  Procedure Laterality Date   ABDOMINAL HYSTERECTOMY  2007   partial    AXILLARY LYMPH NODE DISSECTION Left 03/11/2019   Procedure: LEFT AXILLARY LYMPH NODE DISSECTION;  Surgeon: Erroll Luna, MD;  Location: [place];  Service: General;  Laterality: Left;   BREAST LUMPECTOMY WITH RADIOACTIVE SEED AND SENTINEL LYMPH NODE BIOPSY Left 02/22/2019   Procedure: LEFT BREAST LUMPECTOMY x2 WITH RADIOACTIVE SEED AND LEFT AXILLA SEED  GUIDED LYMPH NODE BIOPSY AND LEFT SENTINEL LYMPH NODE MAPPING;  Surgeon: Erroll Luna, MD;  Location: [place];  Service: General;  Laterality: Left;   CARPAL TUNNEL RELEASE Bilateral    FOOT SURGERY Bilateral    hysterectomy  (other)     PORT-A-CATH REMOVAL N/A 02/22/2019   Procedure: REMOVAL PORT-A-CATH;  Surgeon: Erroll Luna, MD;  Location: [place];  Service: General;  Laterality: N/A;   PORTACATH PLACEMENT N/A 11/10/2018   Procedure: INSERTION PORT-A-CATH WITH ULTRASOUND;  Surgeon: Erroll Luna, MD;  Location: [place];  Service: General;  Laterality: N/A;   TONSILLECTOMY  1968     FAMILY HISTORY:  Family History  Problem Relation Age of Onset   Heart attack Mother 76   Heart disease Mother    Heart attack Maternal Grandmother        age 47 or 38   Heart disease Maternal Grandmother    Neuropathy Neg Hx      SOCIAL HISTORY:  reports that she has never smoked. She has never used smokeless tobacco. She reports that she does not drink alcohol or use drugs. The patient is married and lives in [place]. 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 She has not been working due to schools shutting down in the midst of coronavirus pandemic.   ALLERGIES: Patient has no known allergies.   MEDICATIONS:  Current Outpatient Medications  Medication Sig Dispense Refill   anastrozole (ARIMIDEX) 1 MG tablet TAKE 1 TABLET BY MOUTH EVERY DAY 90 tablet 0   carvedilol (COREG) 6.25 MG tablet TAKE 1 TABLET BY MOUTH TWICE DAILY (Patient taking differently: Take 6.25 mg by mouth 2 (two) times daily with a meal. ) 180 tablet 1   pantoprazole (PROTONIX) 40 MG tablet TAKE 1 TABLET BY MOUTH EVERY DAY 90 tablet 0   ibuprofen (ADVIL) 800 MG tablet Take 1 tablet (800 mg total) by mouth every 8 (eight) hours as needed. (Patient not taking: Reported on 03/29/2019) 30 tablet 0   lisinopril-hydrochlorothiazide (PRINZIDE,ZESTORETIC) 20-12.5 MG per tablet TAKE 2 TABLETS BY MOUTH DAILY (Patient not taking: Reported on 03/29/2019) 180 tablet 1   lisinopril-hydrochlorothiazide (ZESTORETIC) 20-25 MG tablet Take 1  tablet by mouth daily.     oxyCODONE (OXY IR/ROXICODONE) 5 MG immediate release tablet Take 1 tablet (5 mg total) by mouth every 6 (six) hours as needed for severe pain. (Patient not taking: Reported on 03/29/2019) 15 tablet 0   No current facility-administered medications for this encounter.    Facility-Administered Medications Ordered in Other Encounters  Medication Dose Route Frequency Provider Last Rate Last Dose   gadopentetate dimeglumine (MAGNEVIST) injection 20 mL  20 mL Intravenous Once PRN Melvenia Beam, MD       gadopentetate dimeglumine (MAGNEVIST) injection 20 mL  20 mL Intravenous Once PRN Melvenia Beam, MD         REVIEW OF SYSTEMS: On review of systems, the patient reports that she is doing well overall. She has had significant neuropathy though has found a topical cream with gabapentin and anesthetic to be helpful. She denies any chest pain, shortness of breath, cough, fevers, chills, night sweats, unintended weight changes. She denies any bowel or  bladder disturbances, and denies abdominal pain, nausea or vomiting. She states her axillary incision site is still draining and she is being seen by PT for evaluation of this. She denies any LUE edema. She denies any new musculoskeletal or joint aches or pains, new skin lesions or concerns. A complete review of systems is obtained and is otherwise negative.     PHYSICAL EXAM:  Wt Readings from Last 3 Encounters:  03/29/19 271 lb (122.9 kg)  03/11/19 274 lb 7.6 oz (124.5 kg)  02/22/19 278 lb 7.1 oz (126.3 kg)   Temp Readings from Last 3 Encounters:  03/12/19 (!) 97.3 F (36.3 C)  02/22/19 97.9 F (36.6 C)  01/31/19 (!) 97.3 F (36.3 C)   BP Readings from Last 3 Encounters:  03/12/19 102/70  02/22/19 129/90  01/31/19 116/81   Pulse Readings from Last 3 Encounters:  03/12/19 74  02/22/19 73  01/31/19 95     In general this is a well appearing African American female in no acute distress. She is alert and  oriented x4 and appropriate throughout the examination. HEENT reveals that the patient is normocephalic, atraumatic. EOMs are intact. Skin is intact without any evidence of gross lesions. Cardiopulmonary assessment is negative for acute distress and she exhibits normal effort. Breast exam reveals a well healed lumpectomy and drain site without erythema. In the axilla, she has mild fullness above the left axillary incision site about 3 cm in greatest dimension. No erythema was noted. No LUE edema is appreciated.    ECOG = 0  0 - Asymptomatic (Fully active, able to carry on all predisease activities without restriction)  1 - Symptomatic but completely ambulatory (Restricted in physically strenuous activity but ambulatory and able to carry out work of a light or sedentary nature. For example, light housework, office work)  2 - Symptomatic, <50% in bed during the day (Ambulatory and capable of all self care but unable to carry out any work activities. Up and about more than 50% of waking hours)  3 - Symptomatic, >50% in bed, but not bedbound (Capable of only limited self-care, confined to bed or chair 50% or more of waking hours)  4 - Bedbound (Completely disabled. Cannot carry on any self-care. Totally confined to bed or chair)  5 - Death   Eustace Pen MM, Creech RH, Tormey DC, et al. [phone]). "Toxicity and response criteria of the [facility]". [place] Oncol. 5 (6): 649-55    LABORATORY DATA:  Lab Results  Component Value Date   WBC 4.2 02/16/2019   HGB 10.3 (L) 02/16/2019   HCT 31.5 (L) 02/16/2019   MCV 90.3 02/16/2019   PLT 180 02/16/2019   Lab Results  Component Value Date   NA 141 02/16/2019   K 3.3 (L) 02/16/2019   CL 104 02/16/2019   CO2 26 02/16/2019   Lab Results  Component Value Date   ALT 15 02/16/2019   AST 17 02/16/2019   ALKPHOS 63 02/16/2019   BILITOT 0.4 02/16/2019      RADIOGRAPHY: No results found.     IMPRESSION/PLAN: 1. Stage IIA  cT2N1M0, G1, ER/PR positive invasive ductal carcinoma of the left breast. Dr. [name] has reviewed the patient's case including her previous treatment and surgery.  Given her node positive disease, she would benefit from adjuvant radiotherapy to reduce the risk of local recurrence and to treat any persistent microscopic disease.  We discussed the risks, benefits, short, and long term effects of radiotherapy, and the  patient is interested in proceeding. Dr. [name] recommends a course of 6 1/2 weeks of radiotherapy to the left breast and regional nodes with deep inspiration breath-hold technique.  She is scheduled for simulation today. Written consent is obtained and placed in the chart, a copy was provided to the patient. 2. Postop seroma. The patient appears to have a postop seroma in the left axilla. I will follow up with her PT providers so they are aware of this.  3. Chemotherapy induced peripheral neuropathy. The patient will proceed with management of this with Dr. Lindi Adie and her podiatrist. We will follow this expectantly.  In a visit lasting 25 minutes, greater than 50% of the time was spent face to face discussing her case, and coordinating the patient's care.     Carola Rhine, PAC

## 2019-03-29 NOTE — Progress Notes (Signed)
[place] Note  [name] by phone per referral from [facility]. [name] reports being in a "much better place" after two low points in her treatment and was upbeat and engaged on call. Per pt, she has good support from many people, not just family, and is grateful for their care and encouragement. Faith, perspective, and gratitude help her cope and make meaning. Per pt, no other needs at this time, but she was very appreciative of call and knows to reach out to Team anytime.   [place], [place], [facility] Pager [phone] Voicemail [phone]

## 2019-03-30 ENCOUNTER — Encounter: Payer: Self-pay | Admitting: *Deleted

## 2019-03-31 NOTE — Progress Notes (Signed)
  Radiation Oncology         [phone] ________________________________  Name: Sulamita Lafountain [name] MRN: [id number]  Date: 03/29/2019  DOB: 12-Jun-1961  Diagnosis DIAGNOSIS:     ICD-10-CM   1. Malignant neoplasm of upper-outer quadrant of left breast in female, estrogen receptor positive ([place])  C50.412    Z17.0      SIMULATION AND TREATMENT PLANNING NOTE  The patient presented for simulation prior to beginning her course of radiation treatment for her diagnosis of left-sided breast cancer. The patient was placed in a supine position on a breast board. A customized vac-lock bag was also constructed and this complex treatment device will be used on a daily basis during her treatment. In this fashion, a CT scan was obtained through the chest area and an isocenter was placed near the chest wall at the upper aspect of the right chest. A breath-hold technique has also been evaluated to determine if this significantly improves the spatial relationship between the target region and the heart. Based on this analysis, a breath-hold technique has been ordered for the patient's treatment.  The patient will be planned to receive a course of radiation initially to a dose of 50.4 gray. This will consist of a 4 field technique targeting the left breast as well as the supraclavicular region. Therefore 2 customized medial and lateral tangent fields have been created targeting the chest wall, and also 2 additional customized fields have been designed to treat the supraclavicular region both with a left supraclavicular field and a left posterior axillary boost field. A forward planning/reduced field technique will also be evaluated to determine if this significantly improves the dose homogeneity of the overall plan. Therefore, additional customized blocks/fields may be necessary.  This initial treatment will be accomplished at 1.8 gray per fraction.   The initial plan will consist of a 3-D conformal technique.  The target volume/scar, heart and lungs have been contoured and dose volume histograms of each of these structures will be evaluated as part of the 3-D conformal treatment planning process.   It is anticipated that the patient will then receive a 10 gray boost to the surgical scar. This will be accomplished at 2 gray per fraction. The final anticipated total dose therefore will correspond to 60.4 gray.   Special treatment procedure was performed today due to the extra time and effort required by myself to plan and prepare this patient for deep inspiration breath hold technique.  I have determined cardiac sparing to be of benefit to this patient to prevent long term cardiac damage due to radiation of the heart.  Bellows were placed on the patient's abdomen. To facilitate cardiac sparing, the patient was coached by the radiation therapists on breath hold techniques and breathing practice was performed. Practice waveforms were obtained. The patient was then scanned while maintaining breath hold in the treatment position.  This image was then transferred over to the imaging specialist. The imaging specialist then created a fusion of the free breathing and breath hold scans using the chest wall as the stable structure. I personally reviewed the fusion in axial, coronal and sagittal image planes.  Excellent cardiac sparing was obtained.  I felt the patient is an appropriate candidate for breath hold and the patient will be treated as such.  The image fusion was then reviewed with the patient to reinforce the necessity of reproducible breath hold.     _______________________________   Jodelle Gross, MD, PhD

## 2019-03-31 NOTE — Progress Notes (Signed)
  Radiation Oncology         [phone] ________________________________  Name: Jacquelinne Speak [name] MRN: [id number]  Date: 03/29/2019  DOB: 1961/07/23  Optical Surface Tracking Plan:  Since intensity modulated radiotherapy (IMRT) and 3D conformal radiation treatment methods are predicated on accurate and precise positioning for treatment, intrafraction motion monitoring is medically necessary to ensure accurate and safe treatment delivery.  The ability to quantify intrafraction motion without excessive ionizing radiation dose can only be performed with optical surface tracking. Accordingly, surface imaging offers the opportunity to obtain 3D measurements of patient position throughout IMRT and 3D treatments without excessive radiation exposure.  I am ordering optical surface tracking for this patient's upcoming course of radiotherapy. ________________________________  Kyung Rudd, MD 03/31/2019 4:26 PM    Reference:   Ursula Alert, J, et al. Surface imaging-based analysis of intrafraction motion for breast radiotherapy patients.Journal of [place], n. 6, nov. 2014. ISSN 87681157.   Available at: <http://www.jacmp.org/index.php/jacmp/article/view/4957>.

## 2019-04-01 ENCOUNTER — Encounter: Payer: Self-pay | Admitting: Rehabilitation

## 2019-04-01 ENCOUNTER — Other Ambulatory Visit: Payer: Self-pay

## 2019-04-01 ENCOUNTER — Ambulatory Visit: Payer: BC Managed Care – PPO | Admitting: Rehabilitation

## 2019-04-01 DIAGNOSIS — R293 Abnormal posture: Secondary | ICD-10-CM

## 2019-04-01 DIAGNOSIS — C50412 Malignant neoplasm of upper-outer quadrant of left female breast: Secondary | ICD-10-CM

## 2019-04-01 DIAGNOSIS — M25511 Pain in right shoulder: Secondary | ICD-10-CM

## 2019-04-01 DIAGNOSIS — M25611 Stiffness of right shoulder, not elsewhere classified: Secondary | ICD-10-CM

## 2019-04-01 NOTE — Patient Instructions (Signed)
Hold off on overhead stretches to optimize wound healing and perform strength ABC as able with 2# weights

## 2019-04-01 NOTE — Therapy (Signed)
[place], [place], [address] Phone: [phone]   Fax:  [phone]  Physical Therapy Treatment  Patient Details  Name: [name] MRN: [id number] Date of Birth: 11/02/1960 Referring Provider (PT): Dr. Brantley Stage   Encounter Date: 04/01/2019  PT End of Session - 04/01/19 1200    Visit Number  3    Number of Visits  9    Date for PT Re-Evaluation  04/27/19    PT Start Time  1102    PT Stop Time  1145    PT Time Calculation (min)  43 min    Activity Tolerance  Patient tolerated treatment well;Treatment limited secondary to medical complications (Comment)    Behavior During Therapy  [facility] for tasks assessed/performed       Past Medical History:  Diagnosis Date  . Cancer [facility])    breast- left  . Complication of anesthesia   . Fatigue   . GERD (gastroesophageal reflux disease)    w nonobstructing esophageal stricture  . Headache   . Heat intolerance   . History of ETT 6/10   myoview EF 65%, normal wall motion, no ischemia or infarction, poor exercise capacity  . HTN (hypertension)   . Motion sickness   . Normal echocardiogram 6/10   EF 24-58% moderate diastolic dysfunction, mild LAE  . Numbness and tingling   . Obesity   . PONV (postoperative nausea and vomiting)   . S/P partial hysterectomy     Past Surgical History:  Procedure Laterality Date  . ABDOMINAL HYSTERECTOMY  2007   partial   . AXILLARY LYMPH NODE DISSECTION Left 03/11/2019   Procedure: LEFT AXILLARY LYMPH NODE DISSECTION;  Surgeon: Erroll Luna, MD;  Location: [place];  Service: General;  Laterality: Left;  . BREAST LUMPECTOMY WITH RADIOACTIVE SEED AND SENTINEL LYMPH NODE BIOPSY Left 02/22/2019   Procedure: LEFT BREAST LUMPECTOMY x2 WITH RADIOACTIVE SEED AND LEFT AXILLA SEED  GUIDED LYMPH NODE BIOPSY AND LEFT SENTINEL LYMPH NODE MAPPING;  Surgeon: Erroll Luna, MD;  Location: [place];  Service: General;  Laterality: Left;  . CARPAL TUNNEL RELEASE Bilateral   . FOOT SURGERY Bilateral   . hysterectomy (other)    . PORT-A-CATH REMOVAL N/A 02/22/2019   Procedure: REMOVAL PORT-A-CATH;  Surgeon: Erroll Luna, MD;  Location: [place];  Service: General;  Laterality: N/A;  . PORTACATH PLACEMENT N/A 11/10/2018   Procedure: INSERTION PORT-A-CATH WITH ULTRASOUND;  Surgeon: Erroll Luna, MD;  Location: [place];  Service: General;  Laterality: N/A;  . TONSILLECTOMY  1968    There were no vitals filed for this visit.  Subjective Assessment - 04/01/19 1105    Subjective  They are going to do radiation 04/19/19. They want my incision to heal more. I am really having no problems.    Pertinent History  Left breast stage IIA ER/PR positive, HER2 negative with neoadjuvant chemotherapy 11/11/17-12/20/18 with Adriamycin and Cytoxan stopping due to neuropathy.  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  ALND 03/11/19 by Dr. Brantley Stage to clear axillary contents. HTN,  Radiation will start 03/29/19    Currently in Pain?  No/denies                       [facility] Adult PT Treatment/Exercise - 04/01/19 0001      Exercises   Other Exercises   completed instruction on strength ABC packet with discussion and PT demonstration.  Pt will stop stretches overhead for now until radiation starts to optimize healing and the wound looks a bit longer today compared to evaluation.  See photo             PT Education - 04/01/19 1200    Education Details  strength ABC    Person(s) Educated  Patient    Methods  Explanation;Demonstration;Verbal cues    Comprehension  Verbalized understanding          PT Long Term Goals - 04/01/19 1203      PT LONG TERM GOAL #1   Title  Pt will be educated on lymphedema risk reduction and where to obtain a compression sleeve for flying     Baseline  got measured this week    Status  Achieved      PT LONG TERM GOAL  #2   Title  Pt will improve Rt shoulder abduction to 170 to meet the opposite side    Status  Achieved      PT LONG TERM GOAL #3   Title  Pt will be ind with stretches and strengthening to perform during radiation and after to maximize mobility    Status  Achieved              Plan - 04/01/19 1201    Clinical Impression Statement  due to status of Lt axillary wound looking a bit longer today involving more of the length of the incision it was decided that we would hold off on in person visits and pt will avoid overhead stretches but perform strength ABC with 2# weights to optimize healing for radiation beginning 04/19/19 as able.  Pt overall with no pain and no functional limitations Pt will check in around half way through radiation    PT Next Visit Plan  check ROM and strength/ circumferences for mid radiation check       Patient will benefit from skilled therapeutic intervention in order to improve the following deficits and impairments:     Visit Diagnosis: 1. Malignant neoplasm of upper-outer quadrant of left breast in female, estrogen receptor positive ([place])   2. Abnormal posture   3. Stiffness of right shoulder, not elsewhere classified   4. Acute pain of right shoulder        Problem List Patient Active Problem List   Diagnosis Date Noted  . Breast cancer, left breast ([place]) 03/11/2019  . Leukocytosis 12/28/2018  . Dehydration 12/28/2018  . Nausea vomiting and diarrhea 12/28/2018  . Chemotherapy induced nausea and vomiting 12/28/2018  . Non-intractable vomiting 12/27/2018  . Port-A-Cath in place 11/18/2018  . Malignant neoplasm of upper-outer quadrant of left breast in female, estrogen receptor positive ([place]) 10/26/2018  . Multiple neurological symptoms 03/31/2017  . Pain in back 02/21/2014  . Female stress incontinence 02/21/2014  . Carbuncle 02/21/2014  . Symptomatic menopausal or female climacteric states 02/21/2014  . CONGESTIVE HEART FAILURE 03/22/2009  .  CHEST PAIN-PRECORDIAL 03/22/2009  . Essential hypertension 07/31/2008  . ABDOMINAL PAIN, UNSPECIFIED SITE 07/31/2008    Stark Bray 04/01/2019, 12:04 PM  [place], [place], [address] Phone: [phone]   Fax:  [phone]  Name: [name] MRN: [id number] Date of Birth: [birth year]

## 2019-04-05 ENCOUNTER — Telehealth: Payer: Self-pay | Admitting: Hematology and Oncology

## 2019-04-05 NOTE — Telephone Encounter (Signed)
Scheduled appt per 6/17 sch message - pt aware of appt date and time

## 2019-04-06 ENCOUNTER — Encounter

## 2019-04-06 ENCOUNTER — Ambulatory Visit: Payer: BC Managed Care – PPO | Admitting: Rehabilitation

## 2019-04-07 DIAGNOSIS — C50412 Malignant neoplasm of upper-outer quadrant of left female breast: Secondary | ICD-10-CM | POA: Diagnosis not present

## 2019-04-08 ENCOUNTER — Encounter: Payer: BC Managed Care – PPO | Admitting: Rehabilitation

## 2019-04-11 ENCOUNTER — Ambulatory Visit: Payer: BC Managed Care – PPO | Admitting: Radiation Oncology

## 2019-04-12 ENCOUNTER — Ambulatory Visit: Payer: BC Managed Care – PPO

## 2019-04-13 ENCOUNTER — Ambulatory Visit: Payer: BC Managed Care – PPO

## 2019-04-14 ENCOUNTER — Ambulatory Visit: Payer: BC Managed Care – PPO

## 2019-04-18 ENCOUNTER — Ambulatory Visit: Payer: BC Managed Care – PPO

## 2019-04-18 ENCOUNTER — Ambulatory Visit: Payer: BC Managed Care – PPO | Admitting: Radiation Oncology

## 2019-04-19 ENCOUNTER — Other Ambulatory Visit: Payer: Self-pay

## 2019-04-19 ENCOUNTER — Ambulatory Visit
Admission: RE | Admit: 2019-04-19 | Discharge: 2019-04-19 | Disposition: A | Discharge status: Home / Self Care | Payer: BC Managed Care – PPO | Source: Ambulatory Visit | Attending: Radiation Oncology | Admitting: Radiation Oncology

## 2019-04-19 ENCOUNTER — Ambulatory Visit: Payer: BC Managed Care – PPO

## 2019-04-19 DIAGNOSIS — Z510 Encounter for antineoplastic radiation therapy: Secondary | ICD-10-CM | POA: Insufficient documentation

## 2019-04-19 DIAGNOSIS — C50412 Malignant neoplasm of upper-outer quadrant of left female breast: Secondary | ICD-10-CM | POA: Diagnosis present

## 2019-04-19 DIAGNOSIS — Z170 Estrogen receptor positive status [ER+]: Secondary | ICD-10-CM | POA: Insufficient documentation

## 2019-04-20 ENCOUNTER — Other Ambulatory Visit: Payer: Self-pay

## 2019-04-20 ENCOUNTER — Ambulatory Visit
Admission: RE | Admit: 2019-04-20 | Discharge: 2019-04-20 | Disposition: A | Discharge status: Home / Self Care | Payer: BC Managed Care – PPO | Source: Ambulatory Visit | Attending: Radiation Oncology | Admitting: Radiation Oncology

## 2019-04-20 ENCOUNTER — Ambulatory Visit: Payer: BC Managed Care – PPO

## 2019-04-20 ENCOUNTER — Ambulatory Visit: Payer: BC Managed Care – PPO | Attending: Surgery

## 2019-04-20 DIAGNOSIS — R293 Abnormal posture: Secondary | ICD-10-CM | POA: Diagnosis present

## 2019-04-20 DIAGNOSIS — M25611 Stiffness of right shoulder, not elsewhere classified: Secondary | ICD-10-CM | POA: Insufficient documentation

## 2019-04-20 DIAGNOSIS — Z170 Estrogen receptor positive status [ER+]: Secondary | ICD-10-CM | POA: Insufficient documentation

## 2019-04-20 DIAGNOSIS — M25511 Pain in right shoulder: Secondary | ICD-10-CM | POA: Insufficient documentation

## 2019-04-20 DIAGNOSIS — C50412 Malignant neoplasm of upper-outer quadrant of left female breast: Secondary | ICD-10-CM | POA: Diagnosis not present

## 2019-04-20 NOTE — Therapy (Addendum)
[place], [place], [address] Phone: [phone]   Fax:  [phone]  Physical Therapy Treatment  Patient Details  Name: [name] MRN: [id number] Date of Birth: March 15, 1961 Referring Provider (PT): Dr. Brantley Stage   Encounter Date: 04/20/2019  PT End of Session - 04/20/19 0843    Visit Number  4    Number of Visits  9    Date for PT Re-Evaluation  04/27/19    PT Start Time  0802    PT Stop Time  0843    PT Time Calculation (min)  41 min    Activity Tolerance  Patient tolerated treatment well;Treatment limited secondary to medical complications (Comment)    Behavior During Therapy  [facility] for tasks assessed/performed       Past Medical History:  Diagnosis Date  . Cancer [facility])    breast- left  . Complication of anesthesia   . Fatigue   . GERD (gastroesophageal reflux disease)    w nonobstructing esophageal stricture  . Headache   . Heat intolerance   . History of ETT 6/10   myoview EF 65%, normal wall motion, no ischemia or infarction, poor exercise capacity  . HTN (hypertension)   . Motion sickness   . Normal echocardiogram 6/10   EF 24-40% moderate diastolic dysfunction, mild LAE  . Numbness and tingling   . Obesity   . PONV (postoperative nausea and vomiting)   . S/P partial hysterectomy     Past Surgical History:  Procedure Laterality Date  . ABDOMINAL HYSTERECTOMY  2007   partial   . AXILLARY LYMPH NODE DISSECTION Left 03/11/2019   Procedure: LEFT AXILLARY LYMPH NODE DISSECTION;  Surgeon: Erroll Luna, MD;  Location: [place];  Service: General;  Laterality: Left;  . BREAST LUMPECTOMY WITH RADIOACTIVE SEED AND SENTINEL LYMPH NODE BIOPSY Left 02/22/2019   Procedure: LEFT BREAST LUMPECTOMY x2 WITH RADIOACTIVE SEED AND LEFT AXILLA SEED  GUIDED LYMPH NODE BIOPSY AND LEFT SENTINEL LYMPH NODE MAPPING;  Surgeon: Erroll Luna, MD;  Location: [place];  Service: General;  Laterality: Left;  . CARPAL TUNNEL RELEASE Bilateral   . FOOT SURGERY Bilateral   . hysterectomy (other)    . PORT-A-CATH REMOVAL N/A 02/22/2019   Procedure: REMOVAL PORT-A-CATH;  Surgeon: Erroll Luna, MD;  Location: [place];  Service: General;  Laterality: N/A;  . PORTACATH PLACEMENT N/A 11/10/2018   Procedure: INSERTION PORT-A-CATH WITH ULTRASOUND;  Surgeon: Erroll Luna, MD;  Location: [place];  Service: General;  Laterality: N/A;  . TONSILLECTOMY  1968    There were no vitals filed for this visit.  Subjective Assessment - 04/20/19 0804    Subjective  I had my first radiation treatment yesterday. My incision was healed up enough to start. The break from therapy helped it to heal.    Pertinent History  Left breast stage IIA ER/PR positive, HER2 negative with neoadjuvant chemotherapy 11/11/17-12/20/18 with Adriamycin and Cytoxan stopping due to neuropathy.  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  ALND 03/11/19 by Dr. Brantley Stage to clear axillary contents. HTN,  Radiation will start 03/29/19    Patient Stated Goals  do everything I can do     Currently in Pain?  No/denies         [facility] PT Assessment - 04/20/19 0001      AROM   Left Shoulder Flexion  162 Degrees    Left Shoulder ABduction  166 Degrees  LYMPHEDEMA/ONCOLOGY QUESTIONNAIRE - 04/20/19 0834      Left Upper Extremity Lymphedema   15 cm Proximal to Olecranon Process  35.8 cm    10 cm Proximal to Olecranon Process  34.3 cm    Olecranon Process  28.8 cm    15 cm Proximal to Ulnar Styloid Process  27.4 cm    10 cm Proximal to Ulnar Styloid Process  25 cm    Just Proximal to Ulnar Styloid Process  17.6 cm    Across Hand at [employer]  20.8 cm    At [place] of 2nd Digit  6.6 cm                  [facility] Adult PT Treatment/Exercise - 04/20/19 0001      Manual Therapy   Passive ROM  In Supine to Lt shoulder into flexion, abduction, IR, er and  D2 gently at all end ranges to decrease strain on healing tissue/incision.                  PT Long Term Goals - 04/01/19 1203      PT LONG TERM GOAL #1   Title  Pt will be educated on lymphedema risk reduction and where to obtain a compression sleeve for flying     Baseline  got measured this week    Status  Achieved      PT LONG TERM GOAL #2   Title  Pt will improve Rt shoulder abduction to 170 to meet the opposite side    Status  Achieved      PT LONG TERM GOAL #3   Title  Pt will be ind with stretches and strengthening to perform during radiation and after to maximize mobility    Status  Achieved            Plan - 04/20/19 0846    Clinical Impression Statement  Pt returns after 2 week hiatus to allow incision furhter healing. Wound is now mostly closed though per pt reports and therapist saw this towards end of session, does still have some clear drainage at times but does appear mostly closed, see picture in note. Was careful not to overstretch pt at end ROM to avoid extra strain to healing tissue. Pts A/ROM has improved since last measured so she will return in 2-3 weeks for remeasure of A/ROM and circumference to assess if she is ok to D/C or needs to cont coming thru radiation.    Examination-Activity Limitations  Lift;Reach Overhead    Examination-Participation Restrictions  Laundry;Cleaning;Meal Prep;Yard Work    Stability/Clinical Decision Making  Stable/Uncomplicated    Rehab Potential  Excellent    PT Frequency  2x / week    PT Duration  4 weeks    PT Treatment/Interventions  ADLs/Self Care Home Management;Therapeutic exercise;Manual lymph drainage;Manual techniques;Patient/family education;Passive range of motion    PT Next Visit Plan  check ROM and strength/ circumferences for mid radiation check and determine need for D/C or renew depending on how pt is tolerating radiation    Consulted and Agree with Plan of Care  Patient       Patient will benefit  from skilled therapeutic intervention in order to improve the following deficits and impairments:  Decreased skin integrity, Pain, Impaired UE functional use, Decreased strength, Decreased knowledge of use of DME, Decreased knowledge of precautions, Decreased range of motion  Visit Diagnosis: 1. Malignant neoplasm of upper-outer quadrant of left breast in female, estrogen receptor positive (  [place])   2. Abnormal posture   3. Stiffness of right shoulder, not elsewhere classified   4. Acute pain of right shoulder        Problem List Patient Active Problem List   Diagnosis Date Noted  . Breast cancer, left breast ([place]) 03/11/2019  . Leukocytosis 12/28/2018  . Dehydration 12/28/2018  . Nausea vomiting and diarrhea 12/28/2018  . Chemotherapy induced nausea and vomiting 12/28/2018  . Non-intractable vomiting 12/27/2018  . Port-A-Cath in place 11/18/2018  . Malignant neoplasm of upper-outer quadrant of left breast in female, estrogen receptor positive ([place]) 10/26/2018  . Multiple neurological symptoms 03/31/2017  . Pain in back 02/21/2014  . Female stress incontinence 02/21/2014  . Carbuncle 02/21/2014  . Symptomatic menopausal or female climacteric states 02/21/2014  . CONGESTIVE HEART FAILURE 03/22/2009  . CHEST PAIN-PRECORDIAL 03/22/2009  . Essential hypertension 07/31/2008  . ABDOMINAL PAIN, UNSPECIFIED SITE 07/31/2008    Otelia Limes, PTA 04/20/2019, 8:50 AM  [place], [place], [address] Phone: [phone]   Fax:  [phone]  Name: Saanvi [facility] MRN: [id number] Date of Birth: 1961/01/11  PHYSICAL THERAPY DISCHARGE SUMMARY  Visits from Start of Care: 4  Current functional level related to goals / functional outcomes: Pt did not answer phone call regarding status check on 08/10/19 so will DC patient at this time.  Last phone call with radiation showing no complaints    Remaining deficits: Need for ocntinued exercise   Education / Equipment: HEP Plan: Patient agrees to discharge.  Patient goals were partially met. Patient is being discharged due to being pleased with the current functional level.  ?????    Shan Levans, PT

## 2019-04-21 ENCOUNTER — Ambulatory Visit: Payer: BC Managed Care – PPO

## 2019-04-21 ENCOUNTER — Other Ambulatory Visit: Payer: Self-pay

## 2019-04-21 ENCOUNTER — Ambulatory Visit
Admission: RE | Admit: 2019-04-21 | Discharge: 2019-04-21 | Disposition: A | Discharge status: Home / Self Care | Payer: BC Managed Care – PPO | Source: Ambulatory Visit | Attending: Radiation Oncology | Admitting: Radiation Oncology

## 2019-04-21 DIAGNOSIS — C50412 Malignant neoplasm of upper-outer quadrant of left female breast: Secondary | ICD-10-CM | POA: Diagnosis not present

## 2019-04-22 ENCOUNTER — Ambulatory Visit: Payer: BC Managed Care – PPO

## 2019-04-22 ENCOUNTER — Other Ambulatory Visit: Payer: Self-pay

## 2019-04-22 ENCOUNTER — Ambulatory Visit
Admission: RE | Admit: 2019-04-22 | Discharge: 2019-04-22 | Disposition: A | Discharge status: Home / Self Care | Payer: BC Managed Care – PPO | Source: Ambulatory Visit | Attending: Radiation Oncology | Admitting: Radiation Oncology

## 2019-04-22 DIAGNOSIS — C50412 Malignant neoplasm of upper-outer quadrant of left female breast: Secondary | ICD-10-CM

## 2019-04-22 MED ORDER — RADIAPLEXRX EX GEL
Freq: Once | CUTANEOUS | Status: AC
  Administered 2019-04-22: 13:00:00 via TOPICAL

## 2019-04-22 MED ORDER — ALRA NON-METALLIC DEODORANT (RAD-ONC)
1.0000 "application " | Freq: Once | TOPICAL | Status: AC
  Administered 2019-04-22: 1 via TOPICAL

## 2019-04-22 NOTE — Progress Notes (Signed)
Pt here for patient teaching.  Pt given Radiation and You booklet, skin care instructions, [place]ra deodorant and Radiaplex gel.  Reviewed areas of pertinence such as fatigue, hair loss, skin changes, breast tenderness and breast swelling . Pt able to give teach back of to pat skin,apply Radiaplex bid, avoid applying anything to skin within 4 hours of treatment, avoid wearing an under wire bra and to use an electric razor if they must shave. Pt verbalized understanding of information given and will contact nursing with any questions or concerns.     Gloriajean Dell. Leonie Green, BSN

## 2019-04-25 ENCOUNTER — Encounter: Payer: BC Managed Care – PPO | Admitting: Rehabilitation

## 2019-04-25 ENCOUNTER — Ambulatory Visit
Admission: RE | Admit: 2019-04-25 | Discharge: 2019-04-25 | Disposition: A | Discharge status: Home / Self Care | Payer: BC Managed Care – PPO | Source: Ambulatory Visit | Attending: Radiation Oncology | Admitting: Radiation Oncology

## 2019-04-25 ENCOUNTER — Ambulatory Visit: Payer: BC Managed Care – PPO

## 2019-04-25 ENCOUNTER — Other Ambulatory Visit: Payer: Self-pay

## 2019-04-25 DIAGNOSIS — C50412 Malignant neoplasm of upper-outer quadrant of left female breast: Secondary | ICD-10-CM | POA: Diagnosis not present

## 2019-04-26 ENCOUNTER — Ambulatory Visit: Payer: BC Managed Care – PPO

## 2019-04-26 ENCOUNTER — Other Ambulatory Visit: Payer: Self-pay

## 2019-04-26 ENCOUNTER — Ambulatory Visit
Admission: RE | Admit: 2019-04-26 | Discharge: 2019-04-26 | Disposition: A | Discharge status: Home / Self Care | Payer: BC Managed Care – PPO | Source: Ambulatory Visit | Attending: Radiation Oncology | Admitting: Radiation Oncology

## 2019-04-26 DIAGNOSIS — C50412 Malignant neoplasm of upper-outer quadrant of left female breast: Secondary | ICD-10-CM | POA: Diagnosis not present

## 2019-04-27 ENCOUNTER — Ambulatory Visit
Admission: RE | Admit: 2019-04-27 | Discharge: 2019-04-27 | Disposition: A | Discharge status: Home / Self Care | Payer: BC Managed Care – PPO | Source: Ambulatory Visit | Attending: Radiation Oncology | Admitting: Radiation Oncology

## 2019-04-27 ENCOUNTER — Ambulatory Visit: Payer: BC Managed Care – PPO

## 2019-04-27 ENCOUNTER — Other Ambulatory Visit: Payer: Self-pay

## 2019-04-27 DIAGNOSIS — C50412 Malignant neoplasm of upper-outer quadrant of left female breast: Secondary | ICD-10-CM | POA: Diagnosis not present

## 2019-04-28 ENCOUNTER — Other Ambulatory Visit: Payer: Self-pay

## 2019-04-28 ENCOUNTER — Ambulatory Visit
Admission: RE | Admit: 2019-04-28 | Discharge: 2019-04-28 | Disposition: A | Discharge status: Home / Self Care | Payer: BC Managed Care – PPO | Source: Ambulatory Visit | Attending: Radiation Oncology | Admitting: Radiation Oncology

## 2019-04-28 ENCOUNTER — Ambulatory Visit: Payer: BC Managed Care – PPO

## 2019-04-28 DIAGNOSIS — C50412 Malignant neoplasm of upper-outer quadrant of left female breast: Secondary | ICD-10-CM | POA: Diagnosis not present

## 2019-04-29 ENCOUNTER — Ambulatory Visit: Payer: BC Managed Care – PPO

## 2019-04-29 ENCOUNTER — Ambulatory Visit
Admission: RE | Admit: 2019-04-29 | Discharge: 2019-04-29 | Disposition: A | Discharge status: Home / Self Care | Payer: BC Managed Care – PPO | Source: Ambulatory Visit | Attending: Radiation Oncology | Admitting: Radiation Oncology

## 2019-04-29 ENCOUNTER — Other Ambulatory Visit: Payer: Self-pay

## 2019-04-29 DIAGNOSIS — C50412 Malignant neoplasm of upper-outer quadrant of left female breast: Secondary | ICD-10-CM | POA: Diagnosis not present

## 2019-05-02 ENCOUNTER — Encounter: Payer: BC Managed Care – PPO | Admitting: Rehabilitation

## 2019-05-02 ENCOUNTER — Ambulatory Visit
Admission: RE | Admit: 2019-05-02 | Discharge: 2019-05-02 | Disposition: A | Discharge status: Home / Self Care | Payer: BC Managed Care – PPO | Source: Ambulatory Visit | Attending: Radiation Oncology | Admitting: Radiation Oncology

## 2019-05-02 ENCOUNTER — Ambulatory Visit: Payer: BC Managed Care – PPO

## 2019-05-02 ENCOUNTER — Other Ambulatory Visit: Payer: Self-pay

## 2019-05-02 DIAGNOSIS — C50412 Malignant neoplasm of upper-outer quadrant of left female breast: Secondary | ICD-10-CM | POA: Diagnosis not present

## 2019-05-03 ENCOUNTER — Ambulatory Visit: Payer: BC Managed Care – PPO

## 2019-05-03 ENCOUNTER — Ambulatory Visit
Admission: RE | Admit: 2019-05-03 | Discharge: 2019-05-03 | Disposition: A | Discharge status: Home / Self Care | Payer: BC Managed Care – PPO | Source: Ambulatory Visit | Attending: Radiation Oncology | Admitting: Radiation Oncology

## 2019-05-03 ENCOUNTER — Other Ambulatory Visit: Payer: Self-pay

## 2019-05-03 ENCOUNTER — Ambulatory Visit: Payer: BC Managed Care – PPO | Admitting: Podiatry

## 2019-05-03 DIAGNOSIS — C50412 Malignant neoplasm of upper-outer quadrant of left female breast: Secondary | ICD-10-CM | POA: Diagnosis not present

## 2019-05-04 ENCOUNTER — Encounter: Payer: BC Managed Care – PPO | Admitting: Rehabilitation

## 2019-05-04 ENCOUNTER — Ambulatory Visit: Payer: BC Managed Care – PPO

## 2019-05-04 ENCOUNTER — Ambulatory Visit
Admission: RE | Admit: 2019-05-04 | Discharge: 2019-05-04 | Disposition: A | Discharge status: Home / Self Care | Payer: BC Managed Care – PPO | Source: Ambulatory Visit | Attending: Radiation Oncology | Admitting: Radiation Oncology

## 2019-05-04 ENCOUNTER — Other Ambulatory Visit: Payer: Self-pay

## 2019-05-04 DIAGNOSIS — C50412 Malignant neoplasm of upper-outer quadrant of left female breast: Secondary | ICD-10-CM | POA: Diagnosis not present

## 2019-05-05 ENCOUNTER — Ambulatory Visit: Payer: BC Managed Care – PPO

## 2019-05-05 ENCOUNTER — Other Ambulatory Visit: Payer: Self-pay

## 2019-05-05 ENCOUNTER — Ambulatory Visit
Admission: RE | Admit: 2019-05-05 | Discharge: 2019-05-05 | Disposition: A | Discharge status: Home / Self Care | Payer: BC Managed Care – PPO | Source: Ambulatory Visit | Attending: Radiation Oncology | Admitting: Radiation Oncology

## 2019-05-05 DIAGNOSIS — C50412 Malignant neoplasm of upper-outer quadrant of left female breast: Secondary | ICD-10-CM | POA: Diagnosis not present

## 2019-05-06 ENCOUNTER — Ambulatory Visit
Admission: RE | Admit: 2019-05-06 | Discharge: 2019-05-06 | Disposition: A | Discharge status: Home / Self Care | Payer: BC Managed Care – PPO | Source: Ambulatory Visit | Attending: Radiation Oncology | Admitting: Radiation Oncology

## 2019-05-06 ENCOUNTER — Other Ambulatory Visit: Payer: Self-pay

## 2019-05-06 ENCOUNTER — Ambulatory Visit: Payer: BC Managed Care – PPO

## 2019-05-06 DIAGNOSIS — C50412 Malignant neoplasm of upper-outer quadrant of left female breast: Secondary | ICD-10-CM | POA: Diagnosis not present

## 2019-05-09 ENCOUNTER — Other Ambulatory Visit: Payer: Self-pay

## 2019-05-09 ENCOUNTER — Ambulatory Visit
Admission: RE | Admit: 2019-05-09 | Discharge: 2019-05-09 | Disposition: A | Discharge status: Home / Self Care | Payer: BC Managed Care – PPO | Source: Ambulatory Visit | Attending: Radiation Oncology | Admitting: Radiation Oncology

## 2019-05-09 ENCOUNTER — Ambulatory Visit: Payer: BC Managed Care – PPO

## 2019-05-09 DIAGNOSIS — C50412 Malignant neoplasm of upper-outer quadrant of left female breast: Secondary | ICD-10-CM | POA: Diagnosis not present

## 2019-05-10 ENCOUNTER — Ambulatory Visit: Payer: BC Managed Care – PPO

## 2019-05-10 ENCOUNTER — Ambulatory Visit
Admission: RE | Admit: 2019-05-10 | Discharge: 2019-05-10 | Disposition: A | Discharge status: Home / Self Care | Payer: BC Managed Care – PPO | Source: Ambulatory Visit | Attending: Radiation Oncology | Admitting: Radiation Oncology

## 2019-05-10 ENCOUNTER — Other Ambulatory Visit: Payer: Self-pay

## 2019-05-10 DIAGNOSIS — C50412 Malignant neoplasm of upper-outer quadrant of left female breast: Secondary | ICD-10-CM | POA: Diagnosis not present

## 2019-05-11 ENCOUNTER — Other Ambulatory Visit: Payer: Self-pay

## 2019-05-11 ENCOUNTER — Ambulatory Visit: Payer: BC Managed Care – PPO

## 2019-05-11 ENCOUNTER — Ambulatory Visit
Admission: RE | Admit: 2019-05-11 | Discharge: 2019-05-11 | Disposition: A | Discharge status: Home / Self Care | Payer: BC Managed Care – PPO | Source: Ambulatory Visit | Attending: Radiation Oncology | Admitting: Radiation Oncology

## 2019-05-11 DIAGNOSIS — C50412 Malignant neoplasm of upper-outer quadrant of left female breast: Secondary | ICD-10-CM | POA: Diagnosis not present

## 2019-05-12 ENCOUNTER — Ambulatory Visit
Admission: RE | Admit: 2019-05-12 | Discharge: 2019-05-12 | Disposition: A | Discharge status: Home / Self Care | Payer: BC Managed Care – PPO | Source: Ambulatory Visit | Attending: Radiation Oncology | Admitting: Radiation Oncology

## 2019-05-12 ENCOUNTER — Ambulatory Visit: Payer: BC Managed Care – PPO

## 2019-05-12 ENCOUNTER — Other Ambulatory Visit: Payer: Self-pay

## 2019-05-12 DIAGNOSIS — C50412 Malignant neoplasm of upper-outer quadrant of left female breast: Secondary | ICD-10-CM | POA: Diagnosis not present

## 2019-05-13 ENCOUNTER — Ambulatory Visit: Payer: BC Managed Care – PPO

## 2019-05-13 ENCOUNTER — Ambulatory Visit
Admission: RE | Admit: 2019-05-13 | Discharge: 2019-05-13 | Disposition: A | Discharge status: Home / Self Care | Payer: BC Managed Care – PPO | Source: Ambulatory Visit | Attending: Radiation Oncology | Admitting: Radiation Oncology

## 2019-05-13 ENCOUNTER — Other Ambulatory Visit: Payer: Self-pay

## 2019-05-13 DIAGNOSIS — C50412 Malignant neoplasm of upper-outer quadrant of left female breast: Secondary | ICD-10-CM | POA: Diagnosis not present

## 2019-05-16 ENCOUNTER — Ambulatory Visit: Payer: BC Managed Care – PPO

## 2019-05-16 ENCOUNTER — Other Ambulatory Visit: Payer: Self-pay

## 2019-05-16 ENCOUNTER — Ambulatory Visit
Admission: RE | Admit: 2019-05-16 | Discharge: 2019-05-16 | Disposition: A | Discharge status: Home / Self Care | Payer: BC Managed Care – PPO | Source: Ambulatory Visit | Attending: Radiation Oncology | Admitting: Radiation Oncology

## 2019-05-16 DIAGNOSIS — Z510 Encounter for antineoplastic radiation therapy: Secondary | ICD-10-CM | POA: Diagnosis not present

## 2019-05-16 DIAGNOSIS — Z170 Estrogen receptor positive status [ER+]: Secondary | ICD-10-CM | POA: Diagnosis not present

## 2019-05-16 DIAGNOSIS — C50412 Malignant neoplasm of upper-outer quadrant of left female breast: Secondary | ICD-10-CM | POA: Insufficient documentation

## 2019-05-17 ENCOUNTER — Ambulatory Visit: Payer: BC Managed Care – PPO

## 2019-05-17 ENCOUNTER — Ambulatory Visit
Admission: RE | Admit: 2019-05-17 | Discharge: 2019-05-17 | Disposition: A | Discharge status: Home / Self Care | Payer: BC Managed Care – PPO | Source: Ambulatory Visit | Attending: Radiation Oncology | Admitting: Radiation Oncology

## 2019-05-17 DIAGNOSIS — C50412 Malignant neoplasm of upper-outer quadrant of left female breast: Secondary | ICD-10-CM | POA: Diagnosis not present

## 2019-05-18 ENCOUNTER — Ambulatory Visit
Admission: RE | Admit: 2019-05-18 | Discharge: 2019-05-18 | Disposition: A | Discharge status: Home / Self Care | Payer: BC Managed Care – PPO | Source: Ambulatory Visit | Attending: Radiation Oncology | Admitting: Radiation Oncology

## 2019-05-18 ENCOUNTER — Ambulatory Visit: Payer: BC Managed Care – PPO

## 2019-05-18 DIAGNOSIS — C50412 Malignant neoplasm of upper-outer quadrant of left female breast: Secondary | ICD-10-CM | POA: Diagnosis not present

## 2019-05-19 ENCOUNTER — Ambulatory Visit
Admission: RE | Admit: 2019-05-19 | Discharge: 2019-05-19 | Disposition: A | Discharge status: Home / Self Care | Payer: BC Managed Care – PPO | Source: Ambulatory Visit | Attending: Radiation Oncology | Admitting: Radiation Oncology

## 2019-05-19 ENCOUNTER — Ambulatory Visit: Payer: BC Managed Care – PPO

## 2019-05-19 DIAGNOSIS — C50412 Malignant neoplasm of upper-outer quadrant of left female breast: Secondary | ICD-10-CM | POA: Diagnosis not present

## 2019-05-20 ENCOUNTER — Ambulatory Visit: Payer: BC Managed Care – PPO | Admitting: Radiation Oncology

## 2019-05-20 ENCOUNTER — Ambulatory Visit
Admission: RE | Admit: 2019-05-20 | Discharge: 2019-05-20 | Disposition: A | Discharge status: Home / Self Care | Payer: BC Managed Care – PPO | Source: Ambulatory Visit | Attending: Radiation Oncology | Admitting: Radiation Oncology

## 2019-05-20 ENCOUNTER — Ambulatory Visit: Payer: BC Managed Care – PPO

## 2019-05-20 DIAGNOSIS — C50412 Malignant neoplasm of upper-outer quadrant of left female breast: Secondary | ICD-10-CM | POA: Diagnosis not present

## 2019-05-23 ENCOUNTER — Ambulatory Visit
Admission: RE | Admit: 2019-05-23 | Discharge: 2019-05-23 | Disposition: A | Discharge status: Home / Self Care | Payer: BC Managed Care – PPO | Source: Ambulatory Visit | Attending: Radiation Oncology | Admitting: Radiation Oncology

## 2019-05-23 ENCOUNTER — Other Ambulatory Visit: Payer: Self-pay

## 2019-05-23 ENCOUNTER — Ambulatory Visit: Payer: BC Managed Care – PPO

## 2019-05-23 DIAGNOSIS — C50412 Malignant neoplasm of upper-outer quadrant of left female breast: Secondary | ICD-10-CM | POA: Diagnosis not present

## 2019-05-23 NOTE — Assessment & Plan Note (Addendum)
10/22/2018:Screening detected left breast architectural distortion: 4.5 cm, UOQ, by ultrasound measured 5 cm at 1 o'clock position multiple enlarged lymph nodes, biopsy revealed grade 1 IDC with DCIS with lymphovascular invasion, lymph node biopsy positive, intramammary lymph node biopsy negative, ER 100%, PR 100%, Ki-67 20%, HER-2 1+ by IHC, T2 and 1 stage Ib 11/01/2018: MRI biopsy left breast IDC grade 1, ER PR positive HER-2 negative  Left lumpectomy: 02/22/2019:([facility]): IDC with DCIS, 5.8cm, margins negative, lymphovascular invasion present HER2 negative, ER 90%, PR 90%, Ki67 2%, clear margins,3/5 LN positive for malignancy.  AX L&D 03/11/2019: 3/14 lymph nodes positive with focal extranodal involvement [id number]  Treatment plan: 1.Neoadjuvant chemotherapy with dose dense Adriamycin and Cytoxan every 2 weeks x4 followed by Taxol weekly x 3 discontinued due to severe peripheral neuropathy completed 01/20/2019 2.Followed by mastectomy on 02/22/2019 3.Followed by radiation 04/20/2019 4.Followed by antiestrogen therapy. ----------------------------------------------------------------------------------------------------------------------------------------------------- Once radiation is complete she will start antiestrogen therapy. Anastrozole counseling:We discussed the risks and benefits of anti-estrogen therapy with aromatase inhibitors. These include but not limited to insomnia, hot flashes, mood changes, vaginal dryness, bone density loss, and weight gain. We strongly believe that the benefits far outweigh the risks. Patient understands these risks and consented to starting treatment. Planned treatment duration is 7 years.  NATALEE clinical trial: Phase 3 clinical trial in patients or estrogen receptor positive randomized between standard antiestrogen therapy versus antiestrogen therapy with ribociclib 400 mg for 36 months.   Ribociclib: I discussed the risks and benefits of Ribociclib  including myelosuppression especially neutropenia and with that risk of infection, there is risk of pulmonary embolism and mild peripheral neuropathy as well. Fatigue, nausea, diarrhea, decreased appetite as well as alopecia and thrombocytopenia are also potential side effects of Ribociclib.  Return to clinic based on the clinical trial.

## 2019-05-24 ENCOUNTER — Ambulatory Visit
Admission: RE | Admit: 2019-05-24 | Discharge: 2019-05-24 | Disposition: A | Discharge status: Home / Self Care | Payer: BC Managed Care – PPO | Source: Ambulatory Visit | Attending: Radiation Oncology | Admitting: Radiation Oncology

## 2019-05-24 ENCOUNTER — Ambulatory Visit: Payer: BC Managed Care – PPO

## 2019-05-24 DIAGNOSIS — C50412 Malignant neoplasm of upper-outer quadrant of left female breast: Secondary | ICD-10-CM | POA: Diagnosis not present

## 2019-05-25 ENCOUNTER — Other Ambulatory Visit: Payer: Self-pay

## 2019-05-25 ENCOUNTER — Ambulatory Visit
Admission: RE | Admit: 2019-05-25 | Discharge: 2019-05-25 | Disposition: A | Discharge status: Home / Self Care | Payer: BC Managed Care – PPO | Source: Ambulatory Visit | Attending: Radiation Oncology | Admitting: Radiation Oncology

## 2019-05-25 ENCOUNTER — Ambulatory Visit: Payer: BC Managed Care – PPO

## 2019-05-25 DIAGNOSIS — C50412 Malignant neoplasm of upper-outer quadrant of left female breast: Secondary | ICD-10-CM | POA: Diagnosis not present

## 2019-05-26 ENCOUNTER — Ambulatory Visit: Payer: BC Managed Care – PPO

## 2019-05-26 ENCOUNTER — Ambulatory Visit
Admission: RE | Admit: 2019-05-26 | Discharge: 2019-05-26 | Disposition: A | Discharge status: Home / Self Care | Payer: BC Managed Care – PPO | Source: Ambulatory Visit | Attending: Radiation Oncology | Admitting: Radiation Oncology

## 2019-05-26 DIAGNOSIS — C50412 Malignant neoplasm of upper-outer quadrant of left female breast: Secondary | ICD-10-CM | POA: Diagnosis not present

## 2019-05-27 ENCOUNTER — Ambulatory Visit: Payer: BC Managed Care – PPO

## 2019-05-27 ENCOUNTER — Ambulatory Visit: Admission: RE | Admit: 2019-05-27 | Payer: BC Managed Care – PPO | Source: Ambulatory Visit

## 2019-05-28 ENCOUNTER — Other Ambulatory Visit: Payer: Self-pay | Admitting: Hematology and Oncology

## 2019-05-29 DIAGNOSIS — C50412 Malignant neoplasm of upper-outer quadrant of left female breast: Secondary | ICD-10-CM | POA: Diagnosis not present

## 2019-05-29 NOTE — Progress Notes (Signed)
Patient Care Team: [name], NP as PCP - General Erroll Luna, MD as Consulting Physician (General Surgery) Nicholas Lose, MD as Consulting Physician (Hematology and Oncology) Kyung Rudd, MD as Consulting Physician (Radiation Oncology) Mauro Kaufmann, RN as Oncology Nurse Navigator Rockwell Germany, RN as Oncology Nurse Navigator  DIAGNOSIS:    ICD-10-CM   1. Malignant neoplasm of upper-outer quadrant of left breast in female, estrogen receptor positive ([place])  C50.412    Z17.0     SUMMARY OF ONCOLOGIC HISTORY: Oncology History  Malignant neoplasm of upper-outer quadrant of left breast in female, estrogen receptor positive ([place])  10/22/2018 Initial Diagnosis   Screening detected left breast architectural distortion: 4.5 cm, UOQ, by ultrasound measured 5 cm at 1 o'clock position multiple enlarged lymph nodes, biopsy revealed grade 1 IDC with DCIS with lymphovascular invasion, lymph node biopsy positive, intramammary lymph node biopsy negative, ER 100%, PR 100%, Ki-67 20%, HER-2 1+ by IHC, T2 and 1 stage 2A   10/27/2018 Cancer Staging   Staging form: Breast, AJCC 8th Edition - Clinical: Stage IIA (cT2, cN1(f), cM0, G1, ER+, PR+, HER2-) - Signed by Nicholas Lose, MD on 11/01/2018   11/11/2018 - 12/20/2018 Neo-Adjuvant Chemotherapy   Neoadjuvant chemotherapy with dose dense Adriamycin and Cytoxan every 2 weeks x4 followed by Taxol weekly x12 (stopped after cycle 3 due to severe neuropathy)   02/01/2019 Breast MRI   Known malignancy in left breast measures 4.7 x 3.5 x 7.2cm, previously 5 x 3 x 10cm.    02/22/2019 Surgery   Left lumpectomy ([facility]): IDC with DCIS, 5.8cm, HER2 negative, ER 90%, PR 90%, Ki67 2%, clear margins,3/5 LN positive for malignancy.    03/02/2019 Cancer Staging   Staging form: Breast, AJCC 8th Edition - Pathologic stage from 03/02/2019: No Stage Recommended (ypT3, pN1a, cM0, G2, ER+, PR+, HER2-) - Signed by Nicholas Lose, MD on 03/02/2019   03/11/2019  Surgery   Left axillary lymph node dissection ([facility]): metastatic carcinoma in 3/14 lymph nodes, focal extranodal involvement by tumor.   04/20/2019 - 05/30/2019 Radiation Therapy   Adjuvant radiation therapy   05/30/2019 -  Anti-estrogen oral therapy   Anastrozole 1 mg daily     CHIEF COMPLIANT: Follow-up to discuss anti-estrogen therapy  INTERVAL HISTORY: [name] is a 58 y.o. with above-mentioned history of left breast cancer treated with neoadjuvant chemotherapy, lumpectomy and axillary lymph node dissection, and who is currently receiving radiation therapy. She presents to the clinic today to discuss further treatment with anti-estrogen therapy.   REVIEW OF SYSTEMS:   Constitutional: Denies fevers, chills or abnormal weight loss Eyes: Denies blurriness of vision Ears, nose, mouth, throat, and face: Denies mucositis or sore throat Respiratory: Denies cough, dyspnea or wheezes Cardiovascular: Denies palpitation, chest discomfort Gastrointestinal: Denies nausea, heartburn or change in bowel habits Skin: Denies abnormal skin rashes Lymphatics: Denies new lymphadenopathy or easy bruising Neurological: Denies numbness, tingling or new weaknesses Behavioral/Psych: Mood is stable, no new changes  Extremities: No lower extremity edema Breast: Radiation dermatitis to the left breast and chest wall All other systems were reviewed with the patient and are negative.  I have reviewed the past medical history, past surgical history, social history and family history with the patient and they are unchanged from previous note.  ALLERGIES:  has No Known Allergies.  MEDICATIONS:  Current Outpatient Medications  Medication Sig Dispense Refill   anastrozole (ARIMIDEX) 1 MG tablet TAKE 1 TABLET BY MOUTH EVERY DAY 90 tablet 0   carvedilol (COREG)  6.25 MG tablet TAKE 1 TABLET BY MOUTH TWICE DAILY (Patient taking differently: Take 6.25 mg by mouth 2 (two) times daily with a meal.  ) 180 tablet 1   ibuprofen (ADVIL) 800 MG tablet Take 1 tablet (800 mg total) by mouth every 8 (eight) hours as needed. 30 tablet 0   lisinopril-hydrochlorothiazide (PRINZIDE,ZESTORETIC) 20-12.5 MG per tablet TAKE 2 TABLETS BY MOUTH DAILY 180 tablet 1   lisinopril-hydrochlorothiazide (ZESTORETIC) 20-25 MG tablet Take 1 tablet by mouth daily.     oxyCODONE (OXY IR/ROXICODONE) 5 MG immediate release tablet Take 1 tablet (5 mg total) by mouth every 6 (six) hours as needed for severe pain. 15 tablet 0   pantoprazole (PROTONIX) 40 MG tablet TAKE 1 TABLET BY MOUTH EVERY DAY 90 tablet 0   No current facility-administered medications for this visit.    Facility-Administered Medications Ordered in Other Visits  Medication Dose Route Frequency Provider Last Rate Last Dose   gadopentetate dimeglumine (MAGNEVIST) injection 20 mL  20 mL Intravenous Once PRN Melvenia Beam, MD       gadopentetate dimeglumine (MAGNEVIST) injection 20 mL  20 mL Intravenous Once PRN Melvenia Beam, MD        PHYSICAL EXAMINATION: ECOG PERFORMANCE STATUS: 1 - Symptomatic but completely ambulatory  Vitals:   05/30/19 1533  BP: 112/84  Pulse: 95  Resp: 17  Temp: 98.9 F (37.2 C)  SpO2: 100%   Filed Weights   05/30/19 1533  Weight: 284 lb 4.8 oz (129 kg)    GENERAL: alert, no distress and comfortable SKIN: skin color, texture, turgor are normal, no rashes or significant lesions EYES: normal, Conjunctiva are pink and non-injected, sclera clear OROPHARYNX: no exudate, no erythema and lips, buccal mucosa, and tongue normal  NECK: supple, thyroid normal size, non-tender, without nodularity LYMPH: no palpable lymphadenopathy in the cervical, axillary or inguinal LUNGS: clear to auscultation and percussion with normal breathing effort HEART: regular rate & rhythm and no murmurs and no lower extremity edema ABDOMEN: abdomen soft, non-tender and normal bowel sounds MUSCULOSKELETAL: no cyanosis of digits and no  clubbing  NEURO: alert & oriented x 3 with fluent speech, no focal motor/sensory deficits EXTREMITIES: No lower extremity edema  LABORATORY DATA:  I have reviewed the data as listed CMP Latest Ref Rng & Units 02/16/2019 01/27/2019 01/20/2019  Glucose 70 - 99 mg/dL 103(H) 104(H) 89  BUN 6 - 20 mg/dL '11 13 10  '$ Creatinine 0.44 - 1.00 mg/dL 0.85 0.82 0.88  Sodium 135 - 145 mmol/L 141 141 140  Potassium 3.5 - 5.1 mmol/L 3.3(L) 3.2(L) 3.7  Chloride 98 - 111 mmol/L 104 107 108  CO2 22 - 32 mmol/L '26 25 22  '$ Calcium 8.9 - 10.3 mg/dL 9.5 8.9 8.9  Total Protein 6.5 - 8.1 g/dL 6.1(L) 6.6 6.5  Total Bilirubin 0.3 - 1.2 mg/dL 0.4 0.4 0.5  Alkaline Phos 38 - 126 U/L 63 55 53  AST 15 - 41 U/L '17 16 16  '$ ALT 0 - 44 U/L '15 13 14    '$ Lab Results  Component Value Date   WBC 4.2 02/16/2019   HGB 10.3 (L) 02/16/2019   HCT 31.5 (L) 02/16/2019   MCV 90.3 02/16/2019   PLT 180 02/16/2019   NEUTROABS 2.8 02/16/2019    ASSESSMENT & PLAN:  Malignant neoplasm of upper-outer quadrant of left breast in female, estrogen receptor positive ([place]) 10/22/2018:Screening detected left breast architectural distortion: 4.5 cm, UOQ, by ultrasound measured 5 cm at 1 o'clock position multiple  enlarged lymph nodes, biopsy revealed grade 1 IDC with DCIS with lymphovascular invasion, lymph node biopsy positive, intramammary lymph node biopsy negative, ER 100%, PR 100%, Ki-67 20%, HER-2 1+ by IHC, T2 and 1 stage Ib 11/01/2018: MRI biopsy left breast IDC grade 1, ER PR positive HER-2 negative  Left lumpectomy: 02/22/2019:([facility]): IDC with DCIS, 5.8cm, margins negative, lymphovascular invasion present HER2 negative, ER 90%, PR 90%, Ki67 2%, clear margins,3/5 LN positive for malignancy.  AX L&D 03/11/2019: 3/14 lymph nodes positive with focal extranodal involvement [id number]  Treatment plan: 1.Neoadjuvant chemotherapy with dose dense Adriamycin and Cytoxan every 2 weeks x4 followed by Taxol weekly x 3 discontinued due to severe  peripheral neuropathy completed 01/20/2019 2.Followed by mastectomy on 02/22/2019 3.Followed by radiation 04/20/2019 4.Followed by antiestrogen therapy. ----------------------------------------------------------------------------------------------------------------------------------------------------- Once radiation is complete she will start antiestrogen therapy. Anastrozole counseling:We discussed the risks and benefits of anti-estrogen therapy with aromatase inhibitors. These include but not limited to insomnia, hot flashes, mood changes, vaginal dryness, bone density loss, and weight gain. We strongly believe that the benefits far outweigh the risks. Patient understands these risks and consented to starting treatment. Planned treatment duration is 7 years.  NATALEE clinical trial: Phase 3 clinical trial in patients or estrogen receptor positive randomized between standard antiestrogen therapy versus antiestrogen therapy with ribociclib 400 mg for 36 months.   Ribociclib: I discussed the risks and benefits of Ribociclib including myelosuppression especially neutropenia and with that risk of infection, there is risk of pulmonary embolism and mild peripheral neuropathy as well. Fatigue, nausea, diarrhea, decreased appetite as well as alopecia and thrombocytopenia are also potential side effects of Ribociclib.  Return to clinic based on the clinical trial. She is not very keen but is willing to read the protocol and decide on what she wants to do.  No orders of the defined types were placed in this encounter.  The patient has a good understanding of the overall plan. she agrees with it. she will call with any problems that may develop before the next visit here.  Nicholas Lose, MD 05/30/2019  Julious Oka Dorshimer am acting as scribe for Dr. Nicholas Lose.  I have reviewed the above documentation for accuracy and completeness, and I agree with the above.

## 2019-05-30 ENCOUNTER — Ambulatory Visit
Admission: RE | Admit: 2019-05-30 | Discharge: 2019-05-30 | Disposition: A | Discharge status: Home / Self Care | Payer: BC Managed Care – PPO | Source: Ambulatory Visit | Attending: Radiation Oncology | Admitting: Radiation Oncology

## 2019-05-30 ENCOUNTER — Other Ambulatory Visit: Payer: Self-pay

## 2019-05-30 ENCOUNTER — Inpatient Hospital Stay: Payer: BC Managed Care – PPO | Attending: Hematology and Oncology | Admitting: Hematology and Oncology

## 2019-05-30 DIAGNOSIS — Z170 Estrogen receptor positive status [ER+]: Secondary | ICD-10-CM | POA: Diagnosis not present

## 2019-05-30 DIAGNOSIS — C773 Secondary and unspecified malignant neoplasm of axilla and upper limb lymph nodes: Secondary | ICD-10-CM | POA: Diagnosis not present

## 2019-05-30 DIAGNOSIS — C50412 Malignant neoplasm of upper-outer quadrant of left female breast: Secondary | ICD-10-CM

## 2019-05-30 NOTE — Progress Notes (Signed)
[id number]/NATALEE   Patient was referred by Dr. Lindi Adie. This RN met with patient after her radiation treatment. Provided patient with a consent form to review and this RN's business card. Patient and this RN plan to talk tomorrow after her RT appointment so that she has time to review the consent and talk with her husband. Thanked patient for her time and interest. Johney Maine RN, BSN Clinical Research  05/30/19 4:05 PM

## 2019-05-31 ENCOUNTER — Other Ambulatory Visit: Payer: Self-pay

## 2019-05-31 ENCOUNTER — Ambulatory Visit
Admission: RE | Admit: 2019-05-31 | Discharge: 2019-05-31 | Disposition: A | Discharge status: Home / Self Care | Payer: BC Managed Care – PPO | Source: Ambulatory Visit | Attending: Radiation Oncology | Admitting: Radiation Oncology

## 2019-05-31 DIAGNOSIS — C50412 Malignant neoplasm of upper-outer quadrant of left female breast: Secondary | ICD-10-CM

## 2019-05-31 NOTE — Progress Notes (Signed)
Met with patient prior to her radiation treatment. Patient had been given the consent form yesterday and reviewed it with her husband. Patient stated that she was "leery" of the potential side effects. Patient said she appreciated this RN speaking with her but she didn't feel the study was right for her. This RN thanked patient for considering the trial.  Johney Maine RN, BSN Clinical Research  05/31/19 4:09 PM

## 2019-06-01 ENCOUNTER — Other Ambulatory Visit: Payer: Self-pay

## 2019-06-01 ENCOUNTER — Ambulatory Visit
Admission: RE | Admit: 2019-06-01 | Discharge: 2019-06-01 | Disposition: A | Discharge status: Home / Self Care | Payer: BC Managed Care – PPO | Source: Ambulatory Visit | Attending: Radiation Oncology | Admitting: Radiation Oncology

## 2019-06-01 DIAGNOSIS — C50412 Malignant neoplasm of upper-outer quadrant of left female breast: Secondary | ICD-10-CM | POA: Diagnosis not present

## 2019-06-02 ENCOUNTER — Inpatient Hospital Stay: Payer: BC Managed Care – PPO

## 2019-06-02 ENCOUNTER — Other Ambulatory Visit: Payer: Self-pay

## 2019-06-02 ENCOUNTER — Ambulatory Visit
Admission: RE | Admit: 2019-06-02 | Discharge: 2019-06-02 | Disposition: A | Discharge status: Home / Self Care | Payer: BC Managed Care – PPO | Source: Ambulatory Visit | Attending: Radiation Oncology | Admitting: Radiation Oncology

## 2019-06-02 ENCOUNTER — Encounter: Payer: Self-pay | Admitting: Radiation Oncology

## 2019-06-02 ENCOUNTER — Telehealth: Payer: Self-pay | Admitting: *Deleted

## 2019-06-02 DIAGNOSIS — C50412 Malignant neoplasm of upper-outer quadrant of left female breast: Secondary | ICD-10-CM | POA: Diagnosis not present

## 2019-06-02 DIAGNOSIS — Z170 Estrogen receptor positive status [ER+]: Secondary | ICD-10-CM

## 2019-06-02 MED ORDER — RADIAPLEXRX EX GEL
Freq: Once | CUTANEOUS | Status: AC
  Administered 2019-06-02: 12:00:00 via TOPICAL

## 2019-06-02 NOTE — Telephone Encounter (Signed)
Left vm congratulating pt on final xrt today. Contact number given for questions or needs.

## 2019-06-03 ENCOUNTER — Ambulatory Visit (INDEPENDENT_AMBULATORY_CARE_PROVIDER_SITE_OTHER): Payer: BC Managed Care – PPO | Admitting: Podiatry

## 2019-06-03 ENCOUNTER — Encounter: Payer: Self-pay | Admitting: Podiatry

## 2019-06-03 ENCOUNTER — Telehealth: Payer: Self-pay | Admitting: Adult Health

## 2019-06-03 DIAGNOSIS — M79674 Pain in right toe(s): Secondary | ICD-10-CM

## 2019-06-03 DIAGNOSIS — G620 Drug-induced polyneuropathy: Secondary | ICD-10-CM | POA: Diagnosis not present

## 2019-06-03 DIAGNOSIS — B351 Tinea unguium: Secondary | ICD-10-CM

## 2019-06-03 DIAGNOSIS — M79675 Pain in left toe(s): Secondary | ICD-10-CM

## 2019-06-03 DIAGNOSIS — T451X5A Adverse effect of antineoplastic and immunosuppressive drugs, initial encounter: Secondary | ICD-10-CM

## 2019-06-03 MED ORDER — NONFORMULARY OR COMPOUNDED ITEM: Status: DC

## 2019-06-03 NOTE — Patient Instructions (Signed)

## 2019-06-03 NOTE — Telephone Encounter (Signed)
Mailed letter with appt date and time per 8/20 sch message

## 2019-06-13 NOTE — Progress Notes (Signed)
Subjective:  [name] presents to clinic today for management of painful, thick, discolored, elongated toenails 1-5 Hayden/l that become tender and cannot cut because of thickness. Pain is aggravated when wearing enclosed shoe gear.   She has chemotherapy induced peripheral neuropathy and it responds well to compounded neuropathy cream from [place].  She is excited she has completed her last radiation therapy and she GOT TO [place]!  Tammy Beals, NP is her PCP.    Current Outpatient Medications:  .  anastrozole (ARIMIDEX) 1 MG tablet, TAKE 1 TABLET BY MOUTH EVERY DAY, Disp: 90 tablet, Rfl: 0 .  carvedilol (COREG) 6.25 MG tablet, TAKE 1 TABLET BY MOUTH TWICE DAILY (Patient taking differently: Take 6.25 mg by mouth 2 (two) times daily with a meal. ), Disp: 180 tablet, Rfl: 1 .  ibuprofen (ADVIL) 800 MG tablet, Take 1 tablet (800 mg total) by mouth every 8 (eight) hours as needed., Disp: 30 tablet, Rfl: 0 .  lactulose (CHRONULAC) 10 GM/15ML solution, TAKE 15MLS BY MOUTH EVERY DAY, Disp: , Rfl:  .  lisinopril-hydrochlorothiazide (PRINZIDE,ZESTORETIC) 20-12.5 MG per tablet, TAKE 2 TABLETS BY MOUTH DAILY, Disp: 180 tablet, Rfl: 1 .  lisinopril-hydrochlorothiazide (ZESTORETIC) 20-25 MG tablet, Take 1 tablet by mouth daily., Disp: , Rfl:  .  NON FORMULARY, Creams Peripheral Neuropathy Cream: Bupivacaine 1% Doxepin3% Gabapentin 6% Pentoxifyline 3% Topiramate 1% Sig: Apply 1 to 2 gm to affected areaqs 3-4x daily prn 200gm Refills PRN, Disp: , Rfl:  .  NONFORMULARY OR COMPOUNDED ITEM, Creams Peripheral Neuropathy Cream: Bupivacaine 1% Doxepin3% Gabapentin 6% Pentoxifyline 3% Topiramate 1% Sig: Apply 1 to 2 gm to affected areaqs 3-4x daily prn 200gm Refills PRN, Disp:  , Rfl:  .  oxyCODONE (OXY IR/ROXICODONE) 5 MG immediate release tablet, Take 1 tablet (5 mg total) by mouth every 6 (six) hours as needed for severe pain., Disp: 15 tablet, Rfl: 0 .  pantoprazole  (PROTONIX) 40 MG tablet, TAKE 1 TABLET BY MOUTH EVERY DAY, Disp: 90 tablet, Rfl: 0 No current facility-administered medications for this visit.   Facility-Administered Medications Ordered in Other Visits:  .  gadopentetate dimeglumine (MAGNEVIST) injection 20 mL, 20 mL, Intravenous, Once PRN, Tammy Ill B, MD .  gadopentetate dimeglumine (MAGNEVIST) injection 20 mL, 20 mL, Intravenous, Once PRN, Tammy Beam, MD   No Known Allergies   Objective:  Physical Examination:  Vascular Examination: Capillary refill time immediate x 10 digits.  Palpable DP/PT pulses Hayden/l.  Digital hair present Hayden/l.  No edema noted Hayden/l.  Skin temperature gradient WNL Hayden/l.  Dermatological Examination: Skin with normal turgor, texture and tone Hayden/l.  No open wounds Hayden/l.  No interdigital macerations noted Hayden/l.  Elongated, thick, discolored brittle toenails with subungual debris and pain on dorsal palpation of nailbeds 1-5 Hayden/l.  Musculoskeletal Examination: Muscle strength 5/5 to all muscle groups Hayden/l  No pain, crepitus or joint discomfort with active/passive ROM.  Neurological Examination: Sensation intact 5/5 Hayden/l with 10 gram monofilament.  Vibratory sensation intact Hayden/l.  Proprioceptive sensation intact Hayden/l.  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 Toenails 1-5 Hayden/l were debrided in length and girth without iatrogenic laceration.  2. Refill Neuropathy Cream from [place]. 3. Continue soft, supportive shoe gear daily. 4. Report any pedal injuries to medical professional. 5. Follow up 4 months. 6. Patient/POA to call should there be a question/concern in there interim.

## 2019-06-23 ENCOUNTER — Telehealth: Payer: Self-pay | Admitting: Radiation Oncology

## 2019-06-23 NOTE — Telephone Encounter (Signed)
  Radiation Oncology         [phone] ________________________________  Name: [name] [facility] MRN: [id number]  Date of Service: 06/23/2019  DOB: 03/28/1961  Post Treatment Telephone Note  Diagnosis:   Stage IIA cT2N1M0, G1, ER/PR positive invasive ductal carcinoma of the left breast  Interval Since Last Radiation: 3  weeks   04/19/2019-06/02/2019:  The left breast and regional nodes were treated to 50.4 Gy in 28 fractions, followed by a 10 Gy boost over 5 fractions  Narrative:  The patient was contacted today for routine follow-up. During treatment she did very well with radiotherapy and did not have significant desquamation. She reports she is doing great and her skin is healed up. She denies any fatigue and is back to working in education, teaching remotely.  Impression/Plan: 1. Stage IIA cT2N1M0, G1, ER/PR positive invasive ductal carcinoma of the left breast. The patient has been doing well since completion of radiotherapy. We discussed that we would be happy to continue to follow her as needed, but she will also continue to follow up with Dr. Lindi Adie in medical oncology. She was counseled on skin care as well as measures to avoid sun exposure to this area.  2. Survivorship. We discussed the importance of survivorship evaluation and she is scheduled for this in December 2020.     Carola Rhine, PAC

## 2019-06-24 ENCOUNTER — Other Ambulatory Visit: Payer: Self-pay | Admitting: Hematology and Oncology

## 2019-06-26 ENCOUNTER — Other Ambulatory Visit: Payer: Self-pay | Admitting: Hematology and Oncology

## 2019-07-06 NOTE — Progress Notes (Addendum)
  Radiation Oncology         [phone] ________________________________  Name: Aminta Comi [facility] MRN: [id number]  Date: 06/02/2019  DOB: [birth year]  End of Treatment Note  Diagnosis:   left-sided breast cancer     Indication for treatment:  Curative       Radiation treatment dates:   04/19/19 - 06/02/19  Site/dose:   The patient initially received a dose of 50.4 Gy in 28 fractions to the breast using whole-breast tangent fields. The SCLV/ regional nodes were concurrently treated to 50.4 Gy.  This was delivered using a 3-D conformal technique. The patient then received a boost to the seroma. This delivered an additional 10 Gy in 5 fractions using a 3-field photon boost technique. The total dose was 60.4 Gy.  Narrative: The patient tolerated radiation treatment relatively well.   The patient had some expected skin irritation as she progressed during treatment. Moist desquamation was not present at the end of treatment.  Plan: The patient has completed radiation treatment. The patient will return to radiation oncology clinic for routine followup in one month. I advised the patient to call or return sooner if they have any questions or concerns related to their recovery or treatment. ________________________________  Jodelle Gross, M.D., Ph.D.

## 2019-07-23 ENCOUNTER — Other Ambulatory Visit: Payer: Self-pay | Admitting: Hematology and Oncology

## 2019-07-23 ENCOUNTER — Other Ambulatory Visit: Payer: Self-pay | Admitting: Adult Health

## 2019-07-23 DIAGNOSIS — Z170 Estrogen receptor positive status [ER+]: Secondary | ICD-10-CM

## 2019-07-23 DIAGNOSIS — C50412 Malignant neoplasm of upper-outer quadrant of left female breast: Secondary | ICD-10-CM

## 2019-08-03 ENCOUNTER — Encounter: Payer: Self-pay | Admitting: *Deleted

## 2019-09-14 ENCOUNTER — Telehealth: Payer: Self-pay | Admitting: Adult Health

## 2019-09-14 NOTE — Telephone Encounter (Signed)
Returned patient's phone call regarding 12/03 appointment, patient would prefer this visit to converted to virtual.

## 2019-09-15 ENCOUNTER — Inpatient Hospital Stay: Payer: BC Managed Care – PPO | Attending: Adult Health | Admitting: Adult Health

## 2019-09-15 DIAGNOSIS — Z5321 Procedure and treatment not carried out due to patient leaving prior to being seen by health care provider: Secondary | ICD-10-CM

## 2019-09-15 DIAGNOSIS — C50412 Malignant neoplasm of upper-outer quadrant of left female breast: Secondary | ICD-10-CM | POA: Insufficient documentation

## 2019-09-15 DIAGNOSIS — M545 Low back pain: Secondary | ICD-10-CM | POA: Insufficient documentation

## 2019-09-15 DIAGNOSIS — Z923 Personal history of irradiation: Secondary | ICD-10-CM | POA: Insufficient documentation

## 2019-09-15 DIAGNOSIS — R109 Unspecified abdominal pain: Secondary | ICD-10-CM | POA: Insufficient documentation

## 2019-09-15 DIAGNOSIS — G620 Drug-induced polyneuropathy: Secondary | ICD-10-CM | POA: Insufficient documentation

## 2019-09-15 DIAGNOSIS — Z170 Estrogen receptor positive status [ER+]: Secondary | ICD-10-CM | POA: Insufficient documentation

## 2019-09-15 NOTE — Progress Notes (Signed)
Due to unforseen cancer center clinic delays, called patient at 34 to do her survivorship visit.  She reported that her appointment was at 2 and she expected me to be ready to see her at 2, and that she has something scheduled at 245 today.  She requested that she be seen by a provider that isn't tardy.  I apologized to the patient about our unexpected delays and her wait.    Considering the patients tone and inability to wait at any given time for unforseen clinic delays, I will place an schedule request for her to f/u with Dr. Lindi Adie only from this point forward.    Wilber Bihari, NP

## 2019-09-18 IMAGING — CR CHEST - 2 VIEW
2 series · 2 of 2 positions shown · non-contrast
Comparison: 11/10/2018 and older exams.

CLINICAL DATA: History of breast carcinoma. Fatigue and fever for
24 hours.

EXAM:
CHEST - 2 VIEW

[w chest lat]
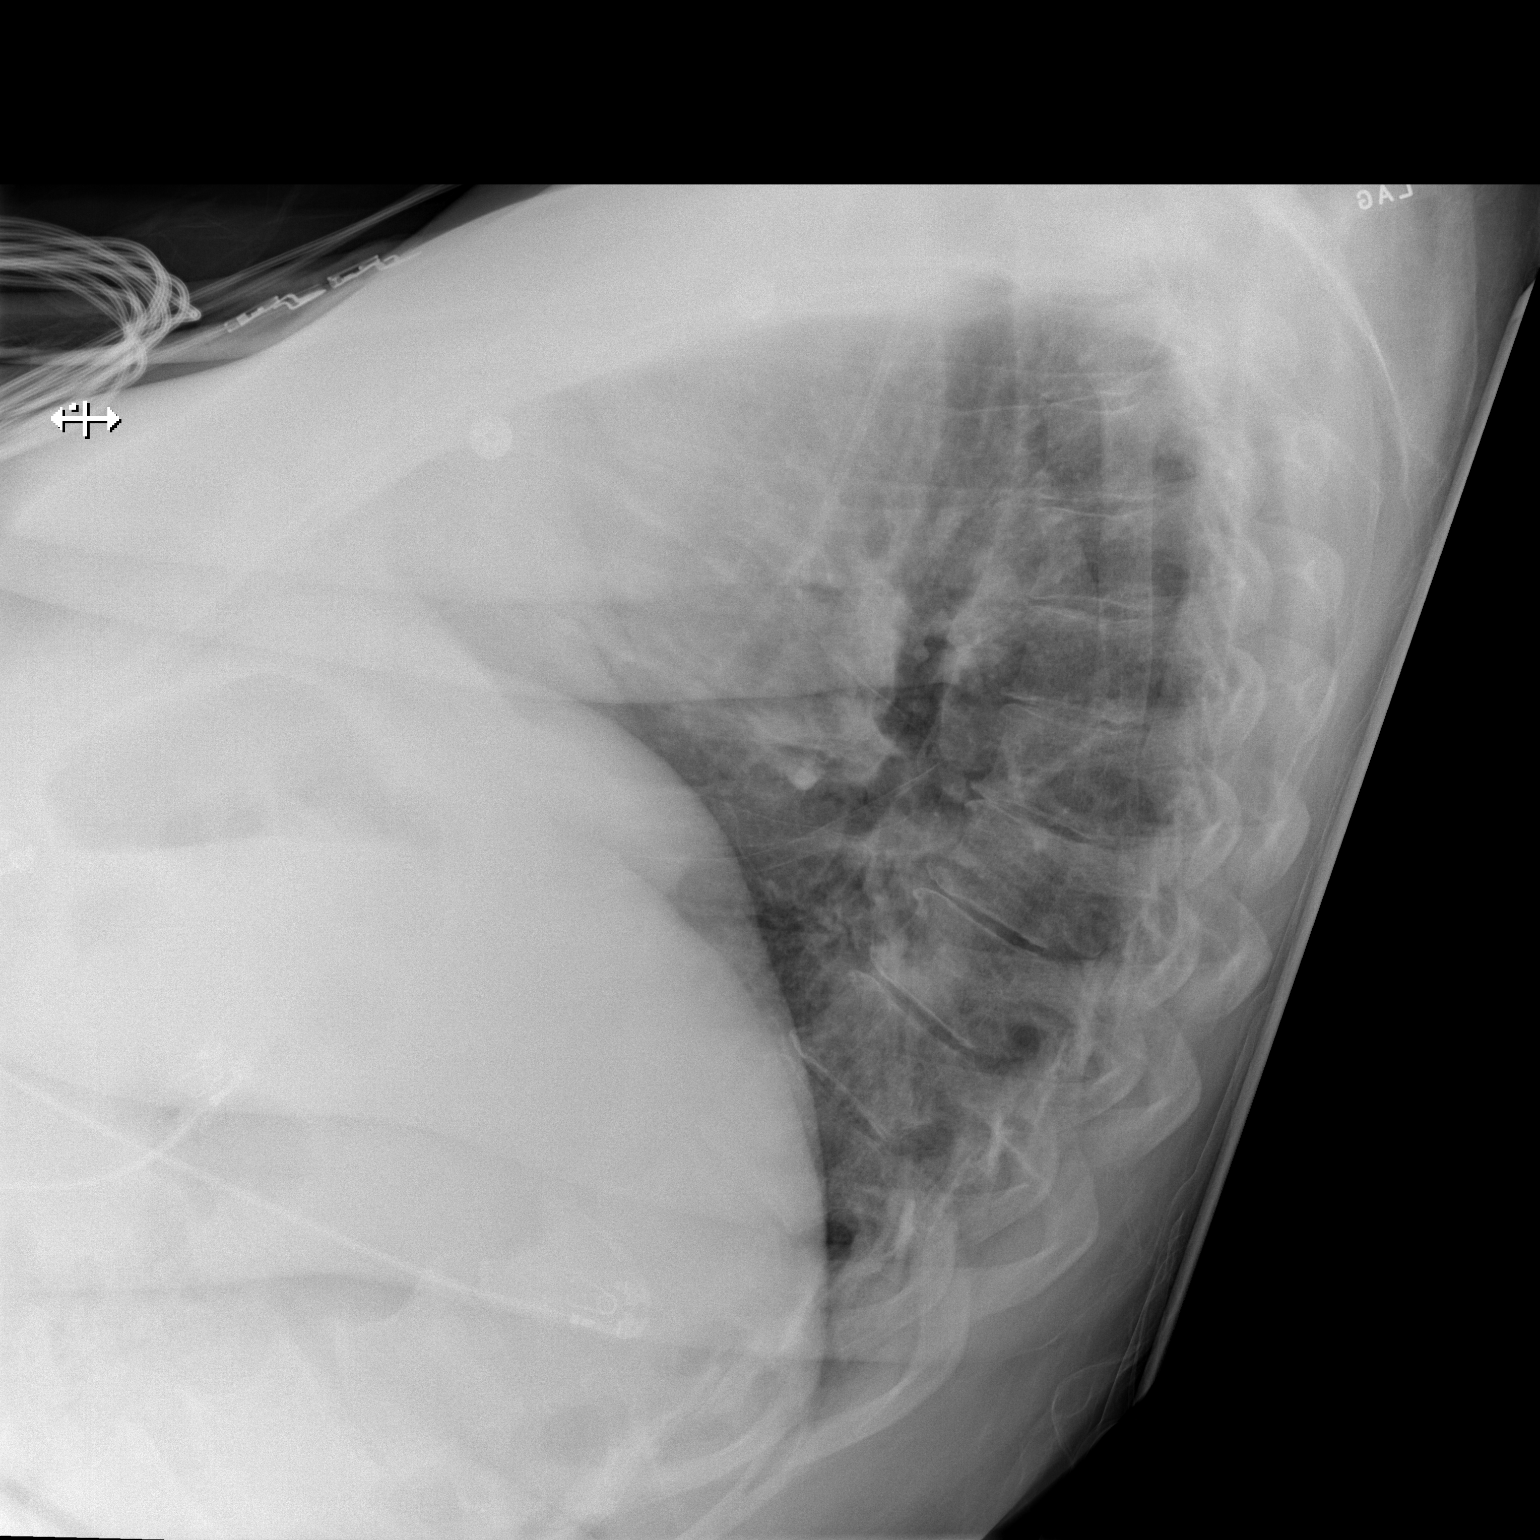

[x chest ap]
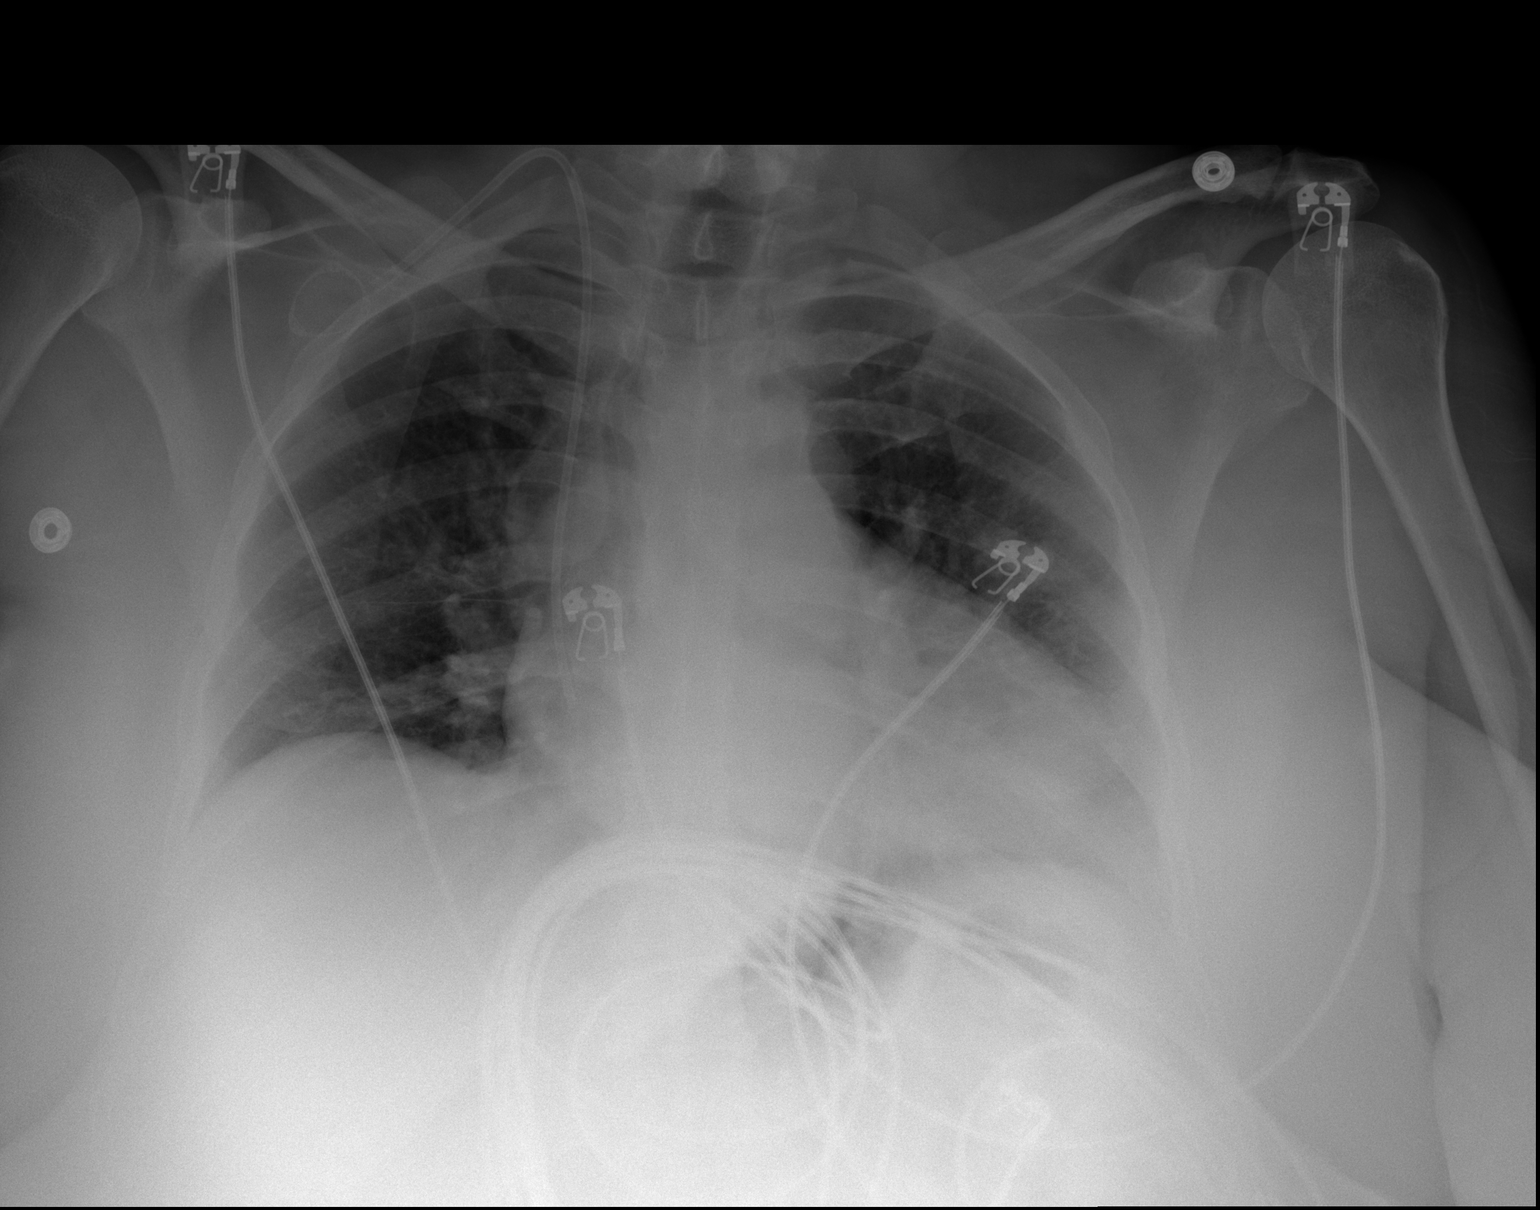

[2 of 2 positions shown; findings below may reference images not displayed]

FINDINGS: The cardiac silhouette is normal in size and configuration. No
mediastinal or hilar masses. No evidence of adenopathy. Right
anterior chest wall Port-A-Cath extends to the right internal
jugular vein, tip projecting in the right atrium, stable.

Lungs are clear.  No pleural effusion or pneumothorax.

Skeletal structures are intact.
IMPRESSION: No active cardiopulmonary disease.

## 2019-09-23 ENCOUNTER — Ambulatory Visit (INDEPENDENT_AMBULATORY_CARE_PROVIDER_SITE_OTHER): Payer: BC Managed Care – PPO | Admitting: Podiatry

## 2019-09-23 ENCOUNTER — Encounter: Payer: Self-pay | Admitting: Podiatry

## 2019-09-23 ENCOUNTER — Other Ambulatory Visit: Payer: Self-pay

## 2019-09-23 DIAGNOSIS — G620 Drug-induced polyneuropathy: Secondary | ICD-10-CM | POA: Diagnosis not present

## 2019-09-23 DIAGNOSIS — M792 Neuralgia and neuritis, unspecified: Secondary | ICD-10-CM

## 2019-09-23 DIAGNOSIS — T451X5A Adverse effect of antineoplastic and immunosuppressive drugs, initial encounter: Secondary | ICD-10-CM | POA: Diagnosis not present

## 2019-09-23 NOTE — Progress Notes (Signed)
Subjective: [name] presents today with history of chemotherapy induced neuropathy. She states she feels her neuropathy symptoms are increasing. She is applying the nonformulary [facility] neuropathy cream twice daily.   [name], NP   Current Outpatient Medications on File Prior to Visit  Medication Sig Dispense Refill  . pregabalin (LYRICA) 50 MG capsule pregabalin 50 mg capsule  TAKE 1 CAPSULE BY MOUTH THREE TIMES A DAY    . anastrozole (ARIMIDEX) 1 MG tablet TAKE 1 TABLET BY MOUTH EVERY DAY 90 tablet 1  . carvedilol (COREG) 6.25 MG tablet TAKE 1 TABLET BY MOUTH TWICE DAILY (Patient taking differently: Take 6.25 mg by mouth 2 (two) times daily with a meal. ) 180 tablet 1  . gabapentin (NEURONTIN) 100 MG capsule gabapentin 100 mg capsule    . HYDROcodone-acetaminophen (NORCO/VICODIN) 5-325 MG tablet hydrocodone 5 mg-acetaminophen 325 mg tablet    . ibuprofen (ADVIL) 800 MG tablet Take 1 tablet (800 mg total) by mouth every 8 (eight) hours as needed. 30 tablet 0  . influenza vac split quadrivalent PF (AFLURIA QUADRIVALENT) 0.5 ML injection Afluria Qd 2020-21 (36 mos up)(PF)60 mcg (15 mcg x4)/0.5 mL IM syringe  PHARMACY ADMINISTERED    . ketorolac (TORADOL) 60 MG/2ML SOLN injection ketorolac 60 mg/2 mL intramuscular solution  Inject 2 mL every day by intramuscular route for 1 day.    . lidocaine-prilocaine (EMLA) cream lidocaine-prilocaine 2.5 %-2.5 % topical cream    . lisinopril-hydrochlorothiazide (ZESTORETIC) 20-25 MG tablet Take 1 tablet by mouth daily.    . naproxen (NAPROSYN) 500 MG tablet naproxen 500 mg tablet    . NON FORMULARY Creams Peripheral Neuropathy Cream: Bupivacaine 1% Doxepin3% Gabapentin 6% Pentoxifyline 3% Topiramate 1% Sig: Apply 1 to 2 gm to affected areaqs 3-4x daily prn 200gm Refills PRN    . NONFORMULARY OR COMPOUNDED ITEM Creams Peripheral Neuropathy Cream: Bupivacaine 1% Doxepin3% Gabapentin 6% Pentoxifyline 3% Topiramate 1%  Sig: Apply 1 to 2 gm to affected areaqs 3-4x daily prn 200gm Refills PRN    . omeprazole (PRILOSEC) 10 MG capsule omeprazole 10 mg capsule,delayed release  TAKE 1 - 2 CAPSULES ONCE DAILY    . ondansetron (ZOFRAN-ODT) 8 MG disintegrating tablet ondansetron 8 mg disintegrating tablet    . pantoprazole (PROTONIX) 40 MG tablet TAKE 1 TABLET BY MOUTH EVERY DAY 90 tablet 0  . prochlorperazine (COMPAZINE) 10 MG tablet prochlorperazine maleate 10 mg tablet    . ranitidine (ZANTAC) 150 MG tablet ranitidine 150 mg tablet  TAKE 1 TABLET BY MOUTH TWICE A DAY AS NEEDED    . silver sulfADIAZINE (SILVADENE) 1 % cream silver sulfadiazine 1 % topical cream     Current Facility-Administered Medications on File Prior to Visit  Medication Dose Route Frequency Provider Last Rate Last Admin  . gadopentetate dimeglumine (MAGNEVIST) injection 20 mL  20 mL Intravenous Once PRN Sarina Ill B, MD      . gadopentetate dimeglumine (MAGNEVIST) injection 20 mL  20 mL Intravenous Once PRN Melvenia Beam, MD        No Known Allergies  Objective: There were no vitals filed for this visit.  Vascular Examination: Capillary refill time immediate x 10 digits.  Dorsalis pedis present b/l.  Posterior tibial pulses present b/l.  Digital hair  present x 10 digits.  Skin temperature gradient WNL b/l.  Trace edema noted dorsum of both feet.   Dermatological Examination: Skin with normal turgor, texture and tone b/l.  Toenails 1-5 b/l adequate length.   No open  wounds or interdigital macerations.  Musculoskeletal: Muscle strength 5/5 to all LE muscle groups b/l.   Neurological: Sensation diminished with 10 gram monofilament.  Vibratory sensation diminished b/l.  Assessment: 1.  Chemotherapy induced neuropathy 2.  Neuropathic pain   Plan: 1. Discussed neuropathy symptoms.  2. Change Rx for compounded neuropathy cream to TID-QID. Resent new Rx to [employer].  3. Patient to continue soft,  supportive shoe gear daily. 4. Patient to report any pedal injuries to medical professional immediately. 5. Follow up 6 months. 6. Patient/POA to call should there be a concern in the interim.

## 2019-09-23 NOTE — Patient Instructions (Signed)

## 2019-10-03 NOTE — Progress Notes (Signed)
Patient Care Team: [name], NP as PCP - General Erroll Luna, MD as Consulting Physician (General Surgery) Nicholas Lose, MD as Consulting Physician (Hematology and Oncology) Kyung Rudd, MD as Consulting Physician (Radiation Oncology)  DIAGNOSIS:    ICD-10-CM   1. Malignant neoplasm of upper-outer quadrant of left breast in female, estrogen receptor positive ([place])  C50.412    Z17.0     SUMMARY OF ONCOLOGIC HISTORY: Oncology History  Malignant neoplasm of upper-outer quadrant of left breast in female, estrogen receptor positive ([place])  10/22/2018 Initial Diagnosis   Screening detected left breast architectural distortion: 4.5 cm, UOQ, by ultrasound measured 5 cm at 1 o'clock position multiple enlarged lymph nodes, biopsy revealed grade 1 IDC with DCIS with lymphovascular invasion, lymph node biopsy positive, intramammary lymph node biopsy negative, ER 100%, PR 100%, Ki-67 20%, HER-2 1+ by IHC, T2 and 1 stage 2A   10/27/2018 Cancer Staging   Staging form: Breast, AJCC 8th Edition - Clinical: Stage IIA (cT2, cN1(f), cM0, G1, ER+, PR+, HER2-) - Signed by Nicholas Lose, MD on 11/01/2018   11/11/2018 - 12/20/2018 Neo-Adjuvant Chemotherapy   Neoadjuvant chemotherapy with dose dense Adriamycin and Cytoxan every 2 weeks x4 followed by Taxol weekly x12 (stopped after cycle 3 due to severe neuropathy)   02/01/2019 Breast MRI   Known malignancy in left breast measures 4.7 x 3.5 x 7.2cm, previously 5 x 3 x 10cm.    02/22/2019 Surgery   Left lumpectomy ([facility]): IDC with DCIS, 5.8cm, HER2 negative, ER 90%, PR 90%, Ki67 2%, clear margins,3/5 LN positive for malignancy.    03/02/2019 Cancer Staging   Staging form: Breast, AJCC 8th Edition - Pathologic stage from 03/02/2019: No Stage Recommended (ypT3, pN1a, cM0, G2, ER+, PR+, HER2-) - Signed by Nicholas Lose, MD on 03/02/2019   03/11/2019 Surgery   Left axillary lymph node dissection ([facility]): metastatic carcinoma in 3/14 lymph nodes,  focal extranodal involvement by tumor.   04/20/2019 - 06/02/2019 Radiation Therapy   Adjuvant radiation therapy   05/30/2019 -  Anti-estrogen oral therapy   Anastrozole 1 mg daily     CHIEF COMPLIANT: Follow-up of left breast cancer on anastrozole   INTERVAL HISTORY: [name] is a 58 y.o. with above-mentioned history of left breast cancer treated with neoadjuvant chemotherapy, lumpectomy and axillary lymph node dissection, radiation, and who is currently on anti-estrogen therapy with anastrozole. She presents to the clinic today for follow-up.  She is tolerating anastrozole fairly well. She does complain of back pain and abdominal discomfort. She continues to have peripheral neuropathy which is not getting any better.  REVIEW OF SYSTEMS:   Constitutional: Denies fevers, chills or abnormal weight loss Eyes: Denies blurriness of vision Ears, nose, mouth, throat, and face: Denies mucositis or sore throat Respiratory: Denies cough, dyspnea or wheezes Cardiovascular: Denies palpitation, chest discomfort Gastrointestinal: Abdominal discomfort Skin: Denies abnormal skin rashes Lymphatics: Denies new lymphadenopathy or easy bruising Neurological: Peripheral neuropathy, Behavioral/Psych: Mood is stable, no new changes  Extremities: No lower extremity edema Breast: denies any pain or lumps or nodules in either breasts All other systems were reviewed with the patient and are negative.  I have reviewed the past medical history, past surgical history, social history and family history with the patient and they are unchanged from previous note.  ALLERGIES:  has No Known Allergies.  MEDICATIONS:  Current Outpatient Medications  Medication Sig Dispense Refill  . anastrozole (ARIMIDEX) 1 MG tablet TAKE 1 TABLET BY MOUTH EVERY DAY 90 tablet 1  .  carvedilol (COREG) 6.25 MG tablet TAKE 1 TABLET BY MOUTH TWICE DAILY (Patient taking differently: Take 6.25 mg by mouth 2 (two) times  daily with a meal. ) 180 tablet 1  . gabapentin (NEURONTIN) 100 MG capsule gabapentin 100 mg capsule    . HYDROcodone-acetaminophen (NORCO/VICODIN) 5-325 MG tablet hydrocodone 5 mg-acetaminophen 325 mg tablet    . ibuprofen (ADVIL) 800 MG tablet Take 1 tablet (800 mg total) by mouth every 8 (eight) hours as needed. 30 tablet 0  . influenza vac split quadrivalent PF (AFLURIA QUADRIVALENT) 0.5 ML injection Afluria Qd 2020-21 (36 mos up)(PF)60 mcg (15 mcg x4)/0.5 mL IM syringe  PHARMACY ADMINISTERED    . ketorolac (TORADOL) 60 MG/2ML SOLN injection ketorolac 60 mg/2 mL intramuscular solution  Inject 2 mL every day by intramuscular route for 1 day.    . lidocaine-prilocaine (EMLA) cream lidocaine-prilocaine 2.5 %-2.5 % topical cream    . lisinopril-hydrochlorothiazide (ZESTORETIC) 20-25 MG tablet Take 1 tablet by mouth daily.    . naproxen (NAPROSYN) 500 MG tablet naproxen 500 mg tablet    . NON FORMULARY Creams Peripheral Neuropathy Cream: Bupivacaine 1% Doxepin3% Gabapentin 6% Pentoxifyline 3% Topiramate 1% Sig: Apply 1 to 2 gm to affected areaqs 3-4x daily prn 200gm Refills PRN    . NONFORMULARY OR COMPOUNDED ITEM Creams Peripheral Neuropathy Cream: Bupivacaine 1% Doxepin3% Gabapentin 6% Pentoxifyline 3% Topiramate 1% Sig: Apply 1 to 2 gm to affected areaqs 3-4x daily prn 200gm Refills PRN    . omeprazole (PRILOSEC) 10 MG capsule omeprazole 10 mg capsule,delayed release  TAKE 1 - 2 CAPSULES ONCE DAILY    . ondansetron (ZOFRAN-ODT) 8 MG disintegrating tablet ondansetron 8 mg disintegrating tablet    . pantoprazole (PROTONIX) 40 MG tablet TAKE 1 TABLET BY MOUTH EVERY DAY 90 tablet 0  . pregabalin (LYRICA) 50 MG capsule pregabalin 50 mg capsule  TAKE 1 CAPSULE BY MOUTH THREE TIMES A DAY    . prochlorperazine (COMPAZINE) 10 MG tablet prochlorperazine maleate 10 mg tablet    . ranitidine (ZANTAC) 150 MG tablet ranitidine 150 mg tablet  TAKE 1 TABLET BY MOUTH TWICE A DAY AS NEEDED    . silver  sulfADIAZINE (SILVADENE) 1 % cream silver sulfadiazine 1 % topical cream     No current facility-administered medications for this visit.   Facility-Administered Medications Ordered in Other Visits  Medication Dose Route Frequency Provider Last Rate Last Admin  . gadopentetate dimeglumine (MAGNEVIST) injection 20 mL  20 mL Intravenous Once PRN Melvenia Beam, MD      . gadopentetate dimeglumine (MAGNEVIST) injection 20 mL  20 mL Intravenous Once PRN Melvenia Beam, MD        PHYSICAL EXAMINATION: ECOG PERFORMANCE STATUS: 1 - Symptomatic but completely ambulatory  Vitals:   10/04/19 0826  BP: (!) 131/100  Pulse: (!) 112  Resp: 20  Temp: 97.8 F (36.6 C)   Filed Weights   10/04/19 0826  Weight: 292 lb 4.8 oz (132.6 kg)    GENERAL: alert, no distress and comfortable SKIN: skin color, texture, turgor are normal, no rashes or significant lesions EYES: normal, Conjunctiva are pink and non-injected, sclera clear OROPHARYNX: no exudate, no erythema and lips, buccal mucosa, and tongue normal  NECK: supple, thyroid normal size, non-tender, without nodularity LYMPH: no palpable lymphadenopathy in the cervical, axillary or inguinal LUNGS: clear to auscultation and percussion with normal breathing effort HEART: regular rate & rhythm and no murmurs and no lower extremity edema ABDOMEN: abdomen soft, non-tender and normal bowel  sounds MUSCULOSKELETAL: no cyanosis of digits and no clubbing  NEURO: alert & oriented x 3 with fluent speech, severe peripheral neuropathy in her feet EXTREMITIES: No lower extremity edema  LABORATORY DATA:  I have reviewed the data as listed CMP Latest Ref Rng & Units 02/16/2019 01/27/2019 01/20/2019  Glucose 70 - 99 mg/dL 103(H) 104(H) 89  BUN 6 - 20 mg/dL '11 13 10  '$ Creatinine 0.44 - 1.00 mg/dL 0.85 0.82 0.88  Sodium 135 - 145 mmol/L 141 141 140  Potassium 3.5 - 5.1 mmol/L 3.3(L) 3.2(L) 3.7  Chloride 98 - 111 mmol/L 104 107 108  CO2 22 - 32 mmol/L '26 25 22    '$ Calcium 8.9 - 10.3 mg/dL 9.5 8.9 8.9  Total Protein 6.5 - 8.1 g/dL 6.1(L) 6.6 6.5  Total Bilirubin 0.3 - 1.2 mg/dL 0.4 0.4 0.5  Alkaline Phos 38 - 126 U/L 63 55 53  AST 15 - 41 U/L '17 16 16  '$ ALT 0 - 44 U/L '15 13 14    '$ Lab Results  Component Value Date   WBC 4.2 02/16/2019   HGB 10.3 (L) 02/16/2019   HCT 31.5 (L) 02/16/2019   MCV 90.3 02/16/2019   PLT 180 02/16/2019   NEUTROABS 2.8 02/16/2019    ASSESSMENT & PLAN:  Malignant neoplasm of upper-outer quadrant of left breast in female, estrogen receptor positive ([place]) 10/22/2018:Screening detected left breast architectural distortion: 4.5 cm, UOQ, by ultrasound measured 5 cm at 1 o'clock position multiple enlarged lymph nodes, biopsy revealed grade 1 IDC with DCIS with lymphovascular invasion, lymph node biopsy positive, intramammary lymph node biopsy negative, ER 100%, PR 100%, Ki-67 20%, HER-2 1+ by IHC, T2 and 1 stage Ib 11/01/2018: MRI biopsy left breast IDC grade 1, ER PR positive HER-2 negative  Left lumpectomy: 02/22/2019:([facility]): IDC with DCIS, 5.8cm,margins negative, lymphovascular invasion presentHER2 negative, ER 90%, PR 90%, Ki67 2%, clear margins,3/5 LN positive for malignancy. AX L&D 03/11/2019: 3/14 lymph nodes positive with focal extranodal involvement [id number]  Treatment plan: 1.Neoadjuvant chemotherapy with dose dense Adriamycin and Cytoxan every 2 weeks x4 followed by Taxol weekly x3discontinued due to severe peripheral neuropathy completed 01/20/2019 2.Followed by [facility] 02/22/2019 3.Followed by radiation 04/20/2019-06/02/2019 4.Followed by antiestrogen therapy. 06/02/2019 ----------------------------------------------------------------------------------------------------------------------------------------------------- Current treatment: Anastrozole started 06/02/2019 Anastrozole toxicities: Denies any adverse effects to anastrozole.  Back pain abdominal pain: CT chest abdomen pelvis will be done in  a week. Bone density will need to be added to her mammogram appointment at [facility] which will be done at 11/11/2018.  I discussed the survivorship care plan with the patient and provided her with a copy of the document. We discussed the importance of exercise and maintaining appropriate weight along with eating healthy and nutritious food while avoiding alcohol and decreasing red meat intake and increasing fruits and vegetables as cornerstones to preventing cancer recurrence.  Return to clinic in 6 months for follow-up and after that we can see her once a year    No orders of the defined types were placed in this encounter.  The patient has a good understanding of the overall plan. she agrees with it. she will call with any problems that may develop before the next visit here.  Nicholas Lose, MD 10/04/2019  Julious Oka Dorshimer, am acting as scribe for Dr. Nicholas Lose.  I have reviewed the above document for accuracy and completeness, and I agree with the above.

## 2019-10-04 ENCOUNTER — Other Ambulatory Visit: Payer: Self-pay

## 2019-10-04 ENCOUNTER — Telehealth: Payer: Self-pay | Admitting: Hematology and Oncology

## 2019-10-04 ENCOUNTER — Inpatient Hospital Stay (HOSPITAL_BASED_OUTPATIENT_CLINIC_OR_DEPARTMENT_OTHER): Payer: BC Managed Care – PPO | Admitting: Hematology and Oncology

## 2019-10-04 VITALS — BP 131/100 | HR 112 | Temp 97.8°F | Resp 20 | Ht 74.0 in | Wt 292.3 lb

## 2019-10-04 DIAGNOSIS — M545 Low back pain: Secondary | ICD-10-CM | POA: Diagnosis not present

## 2019-10-04 DIAGNOSIS — G620 Drug-induced polyneuropathy: Secondary | ICD-10-CM | POA: Diagnosis not present

## 2019-10-04 DIAGNOSIS — C50412 Malignant neoplasm of upper-outer quadrant of left female breast: Secondary | ICD-10-CM | POA: Diagnosis not present

## 2019-10-04 DIAGNOSIS — Z780 Asymptomatic menopausal state: Secondary | ICD-10-CM | POA: Diagnosis not present

## 2019-10-04 DIAGNOSIS — Z170 Estrogen receptor positive status [ER+]: Secondary | ICD-10-CM

## 2019-10-04 DIAGNOSIS — Z923 Personal history of irradiation: Secondary | ICD-10-CM | POA: Diagnosis not present

## 2019-10-04 DIAGNOSIS — R109 Unspecified abdominal pain: Secondary | ICD-10-CM | POA: Diagnosis not present

## 2019-10-04 NOTE — Assessment & Plan Note (Signed)
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 AX L&D 03/11/2019: 3/14 lymph nodes positive with focal extranodal involvement [id number]  Treatment plan: 1.Neoadjuvant chemotherapy with dose dense Adriamycin and Cytoxan every 2 weeks x4 followed by Taxol weekly x3discontinued due to severe peripheral neuropathy completed 01/20/2019 2.Followed by [facility] 02/22/2019 3.Followed by radiation 04/20/2019-06/02/2019 4.Followed by antiestrogen therapy. 06/02/2019 ----------------------------------------------------------------------------------------------------------------------------------------------------- Current treatment: Anastrozole started 06/02/2019 Anastrozole toxicities:  I discussed the survivorship care plan with the patient and provided her with a copy of the document. We discussed the importance of exercise and maintaining appropriate weight along with eating healthy and nutritious food while avoiding alcohol and decreasing red meat intake and increasing fruits and vegetables as cornerstones to preventing cancer recurrence.  Return to clinic in 6 months for follow-up and after that we can see her once a year

## 2019-10-04 NOTE — Telephone Encounter (Signed)
Talk with patient regarding schedule °

## 2019-10-11 ENCOUNTER — Other Ambulatory Visit: Payer: Self-pay | Admitting: Hematology and Oncology

## 2019-10-11 ENCOUNTER — Ambulatory Visit (HOSPITAL_COMMUNITY)
Admission: RE | Admit: 2019-10-11 | Discharge: 2019-10-11 | Disposition: A | Discharge status: Home / Self Care | Payer: BC Managed Care – PPO | Source: Ambulatory Visit | Attending: Hematology and Oncology | Admitting: Hematology and Oncology

## 2019-10-11 ENCOUNTER — Other Ambulatory Visit: Payer: Self-pay

## 2019-10-11 ENCOUNTER — Encounter (HOSPITAL_COMMUNITY): Payer: Self-pay

## 2019-10-11 DIAGNOSIS — C50412 Malignant neoplasm of upper-outer quadrant of left female breast: Secondary | ICD-10-CM

## 2019-10-11 DIAGNOSIS — Z170 Estrogen receptor positive status [ER+]: Secondary | ICD-10-CM | POA: Diagnosis present

## 2019-10-11 DIAGNOSIS — Z780 Asymptomatic menopausal state: Secondary | ICD-10-CM

## 2019-10-11 DIAGNOSIS — R918 Other nonspecific abnormal finding of lung field: Secondary | ICD-10-CM

## 2019-10-11 DIAGNOSIS — K769 Liver disease, unspecified: Secondary | ICD-10-CM

## 2019-10-11 MED ORDER — SODIUM CHLORIDE (PF) 0.9 % IJ SOLN
INTRAMUSCULAR | Status: AC
  Filled 2019-10-11: qty 50

## 2019-10-11 MED ORDER — IOHEXOL 300 MG/ML  SOLN
100.0000 mL | Freq: Once | INTRAMUSCULAR | Status: AC | PRN
  Administered 2019-10-11: 08:00:00 100 mL via INTRAVENOUS

## 2019-10-11 NOTE — Progress Notes (Signed)
I discussed with her the results of the CT scans which showed 2 small lung nodules which we will follow with another CT scan in 3 months.  She has a couple of lesions in the liver for which a liver MRI will be ordered.

## 2019-10-12 ENCOUNTER — Encounter: Payer: Self-pay | Admitting: Hematology and Oncology

## 2019-10-12 ENCOUNTER — Inpatient Hospital Stay: Payer: BC Managed Care – PPO

## 2019-10-12 ENCOUNTER — Other Ambulatory Visit: Payer: Self-pay

## 2019-10-12 DIAGNOSIS — C50412 Malignant neoplasm of upper-outer quadrant of left female breast: Secondary | ICD-10-CM

## 2019-10-12 LAB — CBC WITH DIFFERENTIAL (CANCER CENTER ONLY)
Abs Immature Granulocytes: 0.01 10*3/uL (ref 0.00–0.07)
Basophils Absolute: 0 10*3/uL (ref 0.0–0.1)
Basophils Relative: 0 %
Eosinophils Absolute: 0.1 10*3/uL (ref 0.0–0.5)
Eosinophils Relative: 1 %
HCT: 36.6 % (ref 36.0–46.0)
Hemoglobin: 11.9 g/dL — ABNORMAL LOW (ref 12.0–15.0)
Immature Granulocytes: 0 %
Lymphocytes Relative: 21 %
Lymphs Abs: 0.9 10*3/uL (ref 0.7–4.0)
MCH: 27.6 pg (ref 26.0–34.0)
MCHC: 32.5 g/dL (ref 30.0–36.0)
MCV: 84.9 fL (ref 80.0–100.0)
Monocytes Absolute: 0.4 10*3/uL (ref 0.1–1.0)
Monocytes Relative: 10 %
Neutro Abs: 2.9 10*3/uL (ref 1.7–7.7)
Neutrophils Relative %: 68 %
Platelet Count: 174 10*3/uL (ref 150–400)
RBC: 4.31 MIL/uL (ref 3.87–5.11)
RDW: 12 % (ref 11.5–15.5)
WBC Count: 4.3 10*3/uL (ref 4.0–10.5)
nRBC: 0 % (ref 0.0–0.2)

## 2019-10-12 LAB — CMP (CANCER CENTER ONLY)
ALT: 15 U/L (ref 0–44)
AST: 14 U/L — ABNORMAL LOW (ref 15–41)
Albumin: 3.7 g/dL (ref 3.5–5.0)
Alkaline Phosphatase: 91 U/L (ref 38–126)
Anion gap: 10 (ref 5–15)
BUN: 14 mg/dL (ref 6–20)
CO2: 28 mmol/L (ref 22–32)
Calcium: 9.1 mg/dL (ref 8.9–10.3)
Chloride: 104 mmol/L (ref 98–111)
Creatinine: 1.04 mg/dL — ABNORMAL HIGH (ref 0.44–1.00)
GFR, Est AFR Am: >60 mL/min (ref >60)
GFR, Estimated: 59 mL/min — ABNORMAL LOW (ref >60)
Glucose, Bld: 97 mg/dL (ref 70–99)
Potassium: 3.6 mmol/L (ref 3.5–5.1)
Sodium: 142 mmol/L (ref 135–145)
Total Bilirubin: 0.2 mg/dL — ABNORMAL LOW (ref 0.3–1.2)
Total Protein: 7.3 g/dL (ref 6.5–8.1)

## 2019-10-13 ENCOUNTER — Telehealth: Payer: Self-pay | Admitting: Hematology and Oncology

## 2019-10-13 ENCOUNTER — Ambulatory Visit (HOSPITAL_COMMUNITY)
Admission: RE | Admit: 2019-10-13 | Discharge: 2019-10-13 | Disposition: A | Discharge status: Home / Self Care | Payer: BC Managed Care – PPO | Source: Ambulatory Visit | Attending: Hematology and Oncology | Admitting: Hematology and Oncology

## 2019-10-13 ENCOUNTER — Ambulatory Visit (HOSPITAL_COMMUNITY): Payer: BC Managed Care – PPO

## 2019-10-13 ENCOUNTER — Other Ambulatory Visit: Payer: Self-pay | Admitting: Hematology and Oncology

## 2019-10-13 DIAGNOSIS — K769 Liver disease, unspecified: Secondary | ICD-10-CM | POA: Insufficient documentation

## 2019-10-13 MED ORDER — GADOBUTROL 1 MMOL/ML IV SOLN: 10.0000 mL | Freq: Once | INTRAVENOUS | Status: DC | PRN

## 2019-10-13 MED ORDER — GADOBUTROL 1 MMOL/ML IV SOLN
10.0000 mL | Freq: Once | INTRAVENOUS | Status: AC | PRN
  Administered 2019-10-13: 11:00:00 10 mL via INTRAVENOUS

## 2019-10-13 NOTE — Telephone Encounter (Signed)
I informed the patient the results of the liver MRI which was benign.  It was a cyst. She would like to see me after the CT scan in March.  We will move her appointment accordingly.

## 2019-10-25 ENCOUNTER — Encounter: Payer: Self-pay | Admitting: Hematology and Oncology

## 2019-11-23 ENCOUNTER — Encounter: Payer: Self-pay | Admitting: Hematology and Oncology

## 2019-11-24 ENCOUNTER — Encounter: Payer: Self-pay | Admitting: Podiatry

## 2019-11-24 ENCOUNTER — Other Ambulatory Visit: Payer: Self-pay

## 2019-11-24 ENCOUNTER — Ambulatory Visit: Payer: BC Managed Care – PPO | Admitting: Podiatry

## 2019-11-24 ENCOUNTER — Ambulatory Visit (INDEPENDENT_AMBULATORY_CARE_PROVIDER_SITE_OTHER): Payer: BC Managed Care – PPO

## 2019-11-24 DIAGNOSIS — M19079 Primary osteoarthritis, unspecified ankle and foot: Secondary | ICD-10-CM

## 2019-11-24 DIAGNOSIS — M19071 Primary osteoarthritis, right ankle and foot: Secondary | ICD-10-CM

## 2019-11-24 DIAGNOSIS — M19072 Primary osteoarthritis, left ankle and foot: Secondary | ICD-10-CM | POA: Diagnosis not present

## 2019-11-24 DIAGNOSIS — L600 Ingrowing nail: Secondary | ICD-10-CM

## 2019-11-24 NOTE — Patient Instructions (Signed)

## 2019-11-24 NOTE — Progress Notes (Signed)
Presents today with a chief complaint of painful ingrown toenail hallux right.  She is also having pain across the dorsal aspect of the bilateral foot and fell this morning.  She was able to walk in unassisted.  Has neuropathy associated with chemotherapy.  ROS: Denies fever chills nausea vomiting muscle aches pains calf pain back pain chest pain shortness of breath.  Objective: Vital signs stable alert oriented x3.  Neurologic sensorium is diminished per Semmes Weinstein monofilament distally she has some swelling in the feet bilaterally she has no reproducible pain on palpation of the ankle joints or the dorsum of the foot.  She does have sharp incurvated nail margin along the fibular border of the hallux right.  Radiographs of the foot taken today demonstrate only osteoarthritic changes of the midfoot bilaterally.  Otherwise internal fixation from previous bunion surgery and toe surgery is present.  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  Ingrown toenail fibular border hallux right.  Plan: Discussed etiology pathology conservative therapy at this point went ahead and performed chemical matrixectomy fibular border of the hallux right after local anesthetic was administered she tolerated procedure was without complications.  Provided her with both oral and written home-going instructions for the care and soaking of the toes.  Follow-up with her in 2 weeks also provided her with prescription for Corticosporin otic to be applied twice daily after soaking.

## 2019-11-25 ENCOUNTER — Other Ambulatory Visit: Payer: Self-pay | Admitting: Hematology and Oncology

## 2019-11-25 ENCOUNTER — Telehealth: Payer: Self-pay | Admitting: *Deleted

## 2019-11-25 MED ORDER — NEOMYCIN-POLYMYXIN-HC 1 % OT SOLN: OTIC | 1 refills | Status: DC

## 2019-11-25 NOTE — Telephone Encounter (Signed)
-----   Message from Viviana Simpler, [facility] sent at 11/25/2019  9:45 AM EST ----- [name] patient was in the office yesterday and went to the Pharmacy and there was not any medicine for the patient and had an ingrown down. Thanks Lattie Haw

## 2019-12-08 ENCOUNTER — Telehealth: Payer: Self-pay | Admitting: *Deleted

## 2019-12-08 NOTE — Telephone Encounter (Signed)
Attempt x1 to contact pt regarding scheduling chest CT.  No answer, LVM to return call to the office.

## 2019-12-19 ENCOUNTER — Other Ambulatory Visit: Payer: Self-pay

## 2019-12-19 ENCOUNTER — Ambulatory Visit (HOSPITAL_COMMUNITY)
Admission: RE | Admit: 2019-12-19 | Discharge: 2019-12-19 | Disposition: A | Discharge status: Home / Self Care | Payer: BC Managed Care – PPO | Source: Ambulatory Visit | Attending: Hematology and Oncology | Admitting: Hematology and Oncology

## 2019-12-19 DIAGNOSIS — R918 Other nonspecific abnormal finding of lung field: Secondary | ICD-10-CM | POA: Insufficient documentation

## 2019-12-19 MED ORDER — IOHEXOL 300 MG/ML  SOLN
75.0000 mL | Freq: Once | INTRAMUSCULAR | Status: AC | PRN
  Administered 2019-12-19: 75 mL via INTRAVENOUS

## 2019-12-19 MED ORDER — SODIUM CHLORIDE (PF) 0.9 % IJ SOLN
INTRAMUSCULAR | Status: AC
  Filled 2019-12-19: qty 50

## 2019-12-19 NOTE — Progress Notes (Signed)
HEMATOLOGY-ONCOLOGY TELEPHONE VISIT PROGRESS NOTE  I connected with [name] on 12/20/2019 at 11:00 AM EST by telephone and verified that I am speaking with the correct person using two identifiers.  I discussed the limitations, risks, security and privacy concerns of performing an evaluation and management service by telephone and the availability of in person appointments.  I also discussed with the patient that there may be a patient responsible charge related to this service. The patient expressed understanding and agreed to proceed.   History of Present Illness: [name] is a 59 y.o. female with above-mentioned history of left breast cancer treated with neoadjuvant chemotherapy, lumpectomy and axillary lymph node dissection, radiation, and who is currently on anti-estrogen therapy with anastrozole. Chest CT on 12/19/18 showed several sub-cm pulmonary nodules, suggestive of benign etiology. She presents over the phone today for follow-up.   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, lymph node biopsy positive, intramammary lymph node biopsy negative, ER 100%, PR 100%, Ki-67 20%, HER-2 1+ by IHC, T2 and 1 stage 2A   10/27/2018 Cancer Staging   Staging form: Breast, AJCC 8th Edition - Clinical: Stage IIA (cT2, cN1(f), cM0, G1, ER+, PR+, HER2-) - Signed by Nicholas Lose, MD on 11/01/2018   11/11/2018 - 12/20/2018 Neo-Adjuvant Chemotherapy   Neoadjuvant chemotherapy with dose dense Adriamycin and Cytoxan every 2 weeks x4 followed by Taxol weekly x12 (stopped after cycle 3 due to severe neuropathy)   02/01/2019 Breast MRI   Known malignancy in  left breast measures 4.7 x 3.5 x 7.2cm, previously 5 x 3 x 10cm.    02/22/2019 Surgery   Left lumpectomy ([facility]): IDC with DCIS, 5.8cm, HER2 negative, ER 90%, PR 90%, Ki67 2%, clear margins,3/5 LN positive for malignancy.    03/02/2019 Cancer Staging   Staging form: Breast, AJCC 8th Edition - Pathologic stage from 03/02/2019: No Stage Recommended (ypT3, pN1a, cM0, G2, ER+, PR+, HER2-) - Signed by Nicholas Lose, MD on 03/02/2019   03/11/2019 Surgery   Left axillary lymph node dissection ([facility]): metastatic carcinoma in 3/14 lymph nodes, focal extranodal involvement by tumor.   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 AX L&D 03/11/2019: 3/14 lymph nodes positive with focal extranodal [id number]  Treatment plan: 1.Neoadjuvant chemotherapy with dose dense Adriamycin and Cytoxan every 2 weeks x4 followed by Taxol weekly x3discontinued due to severe peripheral neuropathycompleted 01/20/2019 2.Followed by [facility] 02/22/2019 3.Followed by radiation7/05/2019-06/02/2019 4.Followed by  antiestrogen  therapy. 06/02/2019 ----------------------------------------------------------------------------------------------------------------------------------------------------- Current treatment: Anastrozole started 06/02/2019 Anastrozole toxicities: Denies any adverse effects to anastrozole.  Lung nodules: Repeat CT scan 12/19/2019: Stable subcentimeter lung nodules bilaterally suggesting benign etiology Recheck CT in 6 months  Breast cancer surveillance: Mammogram 10/12/2019: Benign breast density category B Bone density 10/25/2019: T score -0.9: Normal Liver MRI 10/13/2019: No findings of metastatic disease.  Indeterminate right hepatic lobe lesion favored to be a cyst  Return to clinic in 6 months for follow-up after CT chest    I discussed the assessment and treatment plan with the patient. The patient was provided an opportunity to ask questions and all were answered. The patient agreed with the plan and demonstrated an understanding of the instructions. The patient was advised to call back or seek an in-person evaluation if the symptoms worsen or if the condition fails to improve as anticipated.   I provided 12 minutes of non-face-to-face time during this encounter.   Rulon Eisenmenger, MD 12/20/2019    I, Molly Dorshimer, am acting as scribe for Nicholas Lose, MD.  I have reviewed the above documentation for accuracy and completeness, and I agree with the above.

## 2019-12-20 ENCOUNTER — Inpatient Hospital Stay: Payer: BC Managed Care – PPO | Attending: Adult Health | Admitting: Hematology and Oncology

## 2019-12-20 ENCOUNTER — Ambulatory Visit (INDEPENDENT_AMBULATORY_CARE_PROVIDER_SITE_OTHER): Payer: BC Managed Care – PPO | Admitting: Podiatry

## 2019-12-20 ENCOUNTER — Encounter: Payer: Self-pay | Admitting: Podiatry

## 2019-12-20 DIAGNOSIS — M19072 Primary osteoarthritis, left ankle and foot: Secondary | ICD-10-CM | POA: Diagnosis not present

## 2019-12-20 DIAGNOSIS — Z170 Estrogen receptor positive status [ER+]: Secondary | ICD-10-CM

## 2019-12-20 DIAGNOSIS — R918 Other nonspecific abnormal finding of lung field: Secondary | ICD-10-CM

## 2019-12-20 DIAGNOSIS — C50412 Malignant neoplasm of upper-outer quadrant of left female breast: Secondary | ICD-10-CM

## 2019-12-20 DIAGNOSIS — Z9889 Other specified postprocedural states: Secondary | ICD-10-CM

## 2019-12-20 DIAGNOSIS — L600 Ingrowing nail: Secondary | ICD-10-CM

## 2019-12-20 DIAGNOSIS — M7672 Peroneal tendinitis, left leg: Secondary | ICD-10-CM

## 2019-12-20 NOTE — Assessment & Plan Note (Addendum)
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 AX L&D 03/11/2019: 3/14 lymph nodes positive with focal extranodal [id number]  Treatment plan: 1.Neoadjuvant chemotherapy with dose dense Adriamycin and Cytoxan every 2 weeks x4 followed by Taxol weekly x3discontinued due to severe peripheral neuropathycompleted 01/20/2019 2.Followed by mastectomyon 02/22/2019 3.Followed by radiation7/05/2019-06/02/2019 4.Followed by antiestrogen therapy. 06/02/2019 ----------------------------------------------------------------------------------------------------------------------------------------------------- Current treatment: Anastrozole started 06/02/2019 Anastrozole toxicities: Denies any adverse effects to anastrozole.  Lung nodules: Repeat CT scan 12/19/2019: Stable subcentimeter lung nodules bilaterally suggesting benign etiology Recheck CT in 6 months  Breast cancer surveillance: Mammogram 10/12/2019: Benign breast density category B Bone density 10/25/2019: T score -0.9: Normal Liver MRI 10/13/2019: No findings of metastatic disease.  Indeterminate right hepatic lobe lesion favored to be a cyst  Return to clinic in 6 months for follow-up after CT chest 

## 2019-12-20 NOTE — Progress Notes (Signed)
She presents today for follow-up of her matrixectomy hallux right.  States that is doing just perfect have not had any problems with it denies fever chills nausea vomiting muscle aches pains calf pain back pain chest pain shortness of breath.  She does relate that she continues to soak on a regular basis..  She is also complaining of pain this seems to continue along the dorsal lateral aspect of her left foot with swelling as well as pain radiating up into her lower left back.  She states that this seemed to have started since her fall over 2 weeks ago now.  Objective: Vital signs are stable she is alert and oriented x3 pulses are palpable.  She has no erythema edema cellulitis drainage or odor tibial border of the hallux right.  Appears to be healing very nicely.  No tenderness on palpation.  Contralateral foot does demonstrate some swelling to the left foot with pain along the peroneal tendons left and pain overlying the dorsal aspect of the midfoot where we know she has osteoarthritis.  She has no reproducible pain other than that along the peroneal tendons and some soft tissue swelling.  Tendons appear to be intact with abduction against resistance and plantarflexion and eversion.  Assessment: Well-healing surgical toe hallux right possible trauma chronic pain left with radiating pain into her leg.  Plan: Discussed etiology pathology conservative therapies at this point she will continue to compress this with a compression anklet ice therapy and I placed her in a ambulatory surgical boot.  I will follow-up with her in about 3 to 4 weeks to see if she is doing any better.  May need to consider MRI at that point she declined injection.

## 2019-12-29 ENCOUNTER — Telehealth: Payer: Self-pay | Admitting: Hematology and Oncology

## 2019-12-29 NOTE — Telephone Encounter (Signed)
Called patient to follow up with schedule request, left a voicemail.

## 2020-01-17 ENCOUNTER — Telehealth: Payer: Self-pay | Admitting: *Deleted

## 2020-01-17 ENCOUNTER — Other Ambulatory Visit: Payer: Self-pay

## 2020-01-17 ENCOUNTER — Ambulatory Visit: Payer: BC Managed Care – PPO | Admitting: Podiatry

## 2020-01-17 ENCOUNTER — Encounter: Payer: Self-pay | Admitting: Podiatry

## 2020-01-17 DIAGNOSIS — M7672 Peroneal tendinitis, left leg: Secondary | ICD-10-CM | POA: Diagnosis not present

## 2020-01-17 DIAGNOSIS — M5432 Sciatica, left side: Secondary | ICD-10-CM

## 2020-01-17 DIAGNOSIS — G620 Drug-induced polyneuropathy: Secondary | ICD-10-CM

## 2020-01-17 DIAGNOSIS — M549 Dorsalgia, unspecified: Secondary | ICD-10-CM

## 2020-01-17 DIAGNOSIS — M792 Neuralgia and neuritis, unspecified: Secondary | ICD-10-CM

## 2020-01-17 NOTE — Telephone Encounter (Signed)
-----   Message from Cesar Chavez sent at 01/17/2020  9:09 AM EDT ----- Regarding: Referral to NeuroSurgery Dr. Karolee Stamps (?) -   Sciatica left and back pain

## 2020-01-17 NOTE — Telephone Encounter (Signed)
Faxed required form, clinicals for 6 months and demographics to [facility].

## 2020-01-17 NOTE — Progress Notes (Signed)
She presents today for follow-up of her peroneal tendinitis states that is still hurting is just as bad as it was.  She states that it seems to radiate shoot up and down the leg.  She states that she has pain in the buttocks area and lower back and gluteal region this seems to radiate pain as well.  Objective: Vital signs are stable alert oriented x3.  Pulses remain palpable left lower extremity once again the pain is nonreproducible from the left lower extremity.  She does have positive straight leg test.  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  Plan: Request a referral to neurosurgery for evaluation of her lower back and sciatica.

## 2020-01-19 ENCOUNTER — Telehealth: Payer: Self-pay | Admitting: *Deleted

## 2020-01-19 NOTE — Telephone Encounter (Signed)
I spoke with pt and she states she is scheduled to see Dr. Georga Kaufmann and they said Dr. Milinda Pointer could order the MRI of the back since he had seen her. I told pt I would send the message to Dr. Milinda Pointer and see if he would order.

## 2020-01-19 NOTE — Telephone Encounter (Signed)
If she wants insurance to pay for her MRI it will have to be ordered by MD.

## 2020-01-20 NOTE — Telephone Encounter (Signed)
Left message informing pt the MRI would need to be ordered by the specialist requesting.

## 2020-02-01 DIAGNOSIS — R002 Palpitations: Secondary | ICD-10-CM | POA: Insufficient documentation

## 2020-02-01 NOTE — Progress Notes (Signed)
Primary Physician/Referring:  [name], NP  Patient ID: [name], female    DOB: Apr 23, 1961, 59 y.o.   MRN: [id number]  Chief Complaint  Patient presents with  . Palpitations  . New Patient (Initial Visit)   HPI:    [name]  is a 59 y.o. African American female with a history of Left Breast Cancer SP lumpectomy followed by radiation therapy and chemotherapy, diagnosis in January 2020, GERD and hypertension. She is presenting to the office today as a new patient for evaluation of recent dyspnea on exertion and marked fatigue.   She has markedly reduced her physical activity, no associated chest pain, no PND or orthopnea or leg edema.  Past Medical History:  Diagnosis Date  . Cancer [facility])    breast- left  . Complication of anesthesia   . Fatigue   . GERD (gastroesophageal reflux disease)    w nonobstructing esophageal stricture  . Headache   . Heat intolerance   . History of ETT 6/10   myoview EF 65%, normal wall motion, no ischemia or infarction, poor exercise capacity  . HTN (hypertension)   . Motion sickness   . Normal echocardiogram 6/10   EF 123456 moderate diastolic dysfunction, mild LAE  . Numbness and tingling   . Obesity   . PONV (postoperative nausea and vomiting)   . S/P partial hysterectomy    Past Surgical History:  Procedure Laterality Date  . ABDOMINAL HYSTERECTOMY  2007   partial   . AXILLARY LYMPH NODE DISSECTION Left 03/11/2019   Procedure: LEFT AXILLARY LYMPH NODE DISSECTION;  Surgeon: Erroll Luna, MD;  Location: [place];  Service: General;  Laterality: Left;  . BREAST LUMPECTOMY WITH RADIOACTIVE SEED AND SENTINEL LYMPH NODE BIOPSY Left 02/22/2019   Procedure: LEFT BREAST LUMPECTOMY x2 WITH RADIOACTIVE SEED AND LEFT AXILLA SEED  GUIDED LYMPH NODE BIOPSY AND LEFT SENTINEL LYMPH NODE MAPPING;  Surgeon: Erroll Luna, MD;  Location: [place];  Service: General;  Laterality:  Left;  . CARPAL TUNNEL RELEASE Bilateral   . FOOT SURGERY Bilateral   . hysterectomy (other)    . PORT-A-CATH REMOVAL N/A 02/22/2019   Procedure: REMOVAL PORT-A-CATH;  Surgeon: Erroll Luna, MD;  Location: [place];  Service: General;  Laterality: N/A;  . PORTACATH PLACEMENT N/A 11/10/2018   Procedure: INSERTION PORT-A-CATH WITH ULTRASOUND;  Surgeon: Erroll Luna, MD;  Location: [place];  Service: General;  Laterality: N/A;  . TONSILLECTOMY  1968   Social History   Tobacco Use  . Smoking status: Never Smoker  . Smokeless tobacco: Never Used  Substance Use Topics  . Alcohol use: No   Marital Status: Married  ROS  Review of Systems  Constitution: Positive for malaise/fatigue.  Cardiovascular: Positive for dyspnea on exertion and palpitations. Negative for chest pain and leg swelling.  Gastrointestinal: Negative for melena.   Objective  Blood pressure (!) 140/97, pulse (!) 116, temperature (!) 96.9 F (36.1 C), temperature source Temporal, resp. rate 18, height 6\' 2"  (1.88 m), weight 294 lb (133.4 kg), SpO2 95 %.  Vitals with BMI 02/02/2020 10/04/2019 05/30/2019  Height 6\' 2"  6\' 2"  6\' 2"   Weight 294 lbs 292 lbs 5 oz 284 lbs 5 oz  BMI 37.73 0000000 [id number]  Systolic XX123456 [id number]  Diastolic 97 [id number] 84  Pulse 116 112 95     Physical Exam  Constitutional: She appears well-developed. No distress.  Moderately obese  Cardiovascular: Normal rate, regular rhythm, normal heart  sounds and intact distal pulses. Exam reveals no gallop.  No murmur heard. No leg edema, no JVD.  Pulmonary/Chest: Effort normal and breath sounds normal.  Abdominal: Soft. Bowel sounds are normal.   Laboratory examination:   Recent Labs    02/16/19 1439 10/12/19 1326  NA 141 142  K 3.3* 3.6  CL 104 104  CO2 26 28  GLUCOSE 103* 97  BUN 11 14  CREATININE 0.85 1.04*  CALCIUM 9.5 9.1  GFRNONAA >60 59*  GFRAA >60 >60   CrCl cannot be calculated (Patient's most recent lab result is older  than the maximum 21 days allowed.).  CMP Latest Ref Rng & Units 10/12/2019 02/16/2019 01/27/2019  Glucose 70 - 99 mg/dL 97 103(H) 104(H)  BUN 6 - 20 mg/dL 14 11 13   Creatinine 0.44 - 1.00 mg/dL 1.04(H) 0.85 0.82  Sodium 135 - 145 mmol/L 142 141 141  Potassium 3.5 - 5.1 mmol/L 3.6 3.3(L) 3.2(L)  Chloride 98 - 111 mmol/L 104 104 107  CO2 22 - 32 mmol/L 28 26 25   Calcium 8.9 - 10.3 mg/dL 9.1 9.5 8.9  Total Protein 6.5 - 8.1 g/dL 7.3 6.1(L) 6.6  Total Bilirubin 0.3 - 1.2 mg/dL 0.2(L) 0.4 0.4  Alkaline Phos 38 - 126 U/L 91 63 55  AST 15 - 41 U/L 14(L) 17 16  ALT 0 - 44 U/L 15 15 13    CBC Latest Ref Rng & Units 10/12/2019 02/16/2019 01/27/2019  WBC 4.0 - 10.5 K/uL 4.3 4.2 4.1  Hemoglobin 12.0 - 15.0 g/dL 11.9(L) 10.3(L) 8.7(L)  Hematocrit 36.0 - 46.0 % 36.6 31.5(L) 27.0(L)  Platelets 150 - 400 K/uL 174 180 193   Lipid Panel  No results found for: CHOL, TRIG, HDL, CHOLHDL, VLDL, LDLCALC, LDLDIRECT HEMOGLOBIN A1C Lab Results  Component Value Date   HGBA1C 5.7 (H) 05/13/2017   TSH No results for input(s): TSH in the last 8760 hours.  External labs :   Labs 11/03/2017: Total cholesterol 181, triglycerides 89, HDL 45, LDL 118. TSH normal.  A1c 5.4%.  Medications and allergies  No Known Allergies   Current Outpatient Medications  Medication Instructions  . amLODipine (NORVASC) 5 mg, Oral, Daily  . anastrozole (ARIMIDEX) 1 MG tablet TAKE 1 TABLET BY MOUTH EVERY DAY  . aspirin EC 81 mg, Oral, Daily  . atorvastatin (LIPITOR) 10 mg, Oral, Daily  . carvedilol (COREG) 6.25 MG tablet TAKE 1 TABLET BY MOUTH TWICE DAILY  . lisinopril-hydrochlorothiazide (ZESTORETIC) 20-25 MG tablet 1 tablet, Oral, Daily  . naproxen (NAPROSYN) 500 MG tablet naproxen 500 mg tablet  . NON FORMULARY Creams Peripheral Neuropathy Cream: Bupivacaine 1% Doxepin3% Gabapentin 6% Pentoxifyline 3% Topiramate 1% Sig: Apply 1 to 2 gm to affected areaqs 3-4x daily prn 200gmRefills PRN   . NONFORMULARY OR COMPOUNDED ITEM  Creams Peripheral Neuropathy Cream: Bupivacaine 1% Doxepin3% Gabapentin 6% Pentoxifyline 3% Topiramate 1% Sig: Apply 1 to 2 gm to affected areaqs 3-4x daily prn 200gmRefills PRN  . pantoprazole (PROTONIX) 40 MG tablet TAKE 1 TABLET BY MOUTH EVERY DAY  . pregabalin (LYRICA) 50 MG capsule pregabalin 50 mg capsule  TAKE 1 CAPSULE BY MOUTH THREE TIMES A DAY  . silver sulfADIAZINE (SILVADENE) 1 % cream silver sulfadiazine 1 % topical cream    Radiology:   CT Chest, Abdomen, Pelvis W Contrast 10/11/2019: Cardiovascular: Bovine arch. Aortic atherosclerosis. Mild cardiomegaly, without pericardial effusion. No central pulmonary embolism, on this non-dedicated study. IMPRESSION: 1. Primarily right-sided pulmonary nodules. Although these are subpleural in distribution and could  represent subpleural lymph nodes, given size, pulmonary metastasis are a concern. These are below PET resolution. Consider CT follow-up at 3-6 months. 2. Indeterminate right hepatic lobe lesion is too small to characterize at CT. Consider further evaluation with pre and post contrast abdominal MRI 3. Otherwise, no evidence of metastatic disease. 4. Small hiatal hernia. 5.  Aortic Atherosclerosis (ICD10-I70.0). 6. Equivocal bladder wall edema, at least partially felt to be due to underdistention. Consider correlation with urinalysis.  CT Chest W Contrast 12/19/2019: Cardiovascular:  No acute findings. IMPRESSION: Stable sub-cm pulmonary nodules, suggesting benign etiology. Given patient's history of cancer, continued follow-up by chest CT is recommended in 6 months. Stable small hiatal hernia.  Cardiac Studies:   Transthoracic Echocardiography 11/05/2018: - Left ventricle: The cavity size was normal. Wall thickness was  normal. Systolic function was normal. The estimated ejection  fraction was in the range of 55% to 60%. Wall motion was normal;  there were no regional wall motion abnormalities. Left    ventricular diastolic function parameters were normal.  - Left atrium: The atrium was mildly dilated.  - Atrial septum: No defect or patent foramen ovale was identified.  - Impressions: Low normal GLS -16.1.   EKG 02/02/2020: Normal sinus rhythm with rate of 93 beats a minute, normal axis, no evidence of ischemia.  Normal EKG.   No significant change from ECG 12/27/2018 in ER: Sinus tachycardia. No Stemi. Otherwise normal ECG.    Assessment     ICD-10-CM   1. Dyspnea on exertion  R06.00 EKG 12-Lead    PCV ECHOCARDIOGRAM COMPLETE    PCV MYOCARDIAL PERFUSION WO LEXISCAN  2. Essential hypertension  I10 amLODipine (NORVASC) 5 MG tablet    TSH  3. Aortic atherosclerosis (HCC)  I70.0 PCV ECHOCARDIOGRAM COMPLETE    PCV MYOCARDIAL PERFUSION WO LEXISCAN    aspirin EC 81 MG tablet    atorvastatin (LIPITOR) 10 MG tablet  4. Mild hyperlipidemia  E78.5 Lipid Panel With LDL/HDL Ratio    Lipoprotein A (LPA)  5. Class 2 severe obesity due to excess calories with serious comorbidity and body mass index (BMI) of 37.0 to 37.9 in adult ([place])  E66.01    Z68.37   6. Family history of premature CAD. Father died at age 54Y with MI  Z63.49 PCV MYOCARDIAL PERFUSION WO LEXISCAN     Meds ordered this encounter  Medications  . amLODipine (NORVASC) 5 MG tablet    Sig: Take 1 tablet (5 mg total) by mouth daily.    Dispense:  30 tablet    Refill:  2  . aspirin EC 81 MG tablet    Sig: Take 1 tablet (81 mg total) by mouth daily.    Dispense:  90 tablet    Refill:  3  . atorvastatin (LIPITOR) 10 MG tablet    Sig: Take 1 tablet (10 mg total) by mouth daily.    Dispense:  30 tablet    Refill:  2    Medications Discontinued During This Encounter  Medication Reason  . gabapentin (NEURONTIN) 100 MG capsule Patient Preference  . HYDROcodone-acetaminophen (NORCO/VICODIN) 5-325 MG tablet Patient Preference  . ibuprofen (ADVIL) 800 MG tablet Patient Preference  . influenza vac split quadrivalent PF (AFLURIA  QUADRIVALENT) 0.5 ML injection Completed Course  . ketorolac (TORADOL) 60 MG/2ML SOLN injection Completed Course  . lidocaine-prilocaine (EMLA) cream Completed Course  . ondansetron (ZOFRAN-ODT) 8 MG disintegrating tablet Completed Course  . omeprazole (PRILOSEC) 10 MG capsule Change in therapy  . traMADol Veatrice Bourbon)  50 MG tablet Patient Preference  . ranitidine (ZANTAC) 150 MG tablet Completed Course  . scopolamine (TRANSDERM-SCOP) 1 MG/3DAYS Completed Course  . predniSONE (DELTASONE) 20 MG tablet Completed Course  . NEOMYCIN-POLYMYXIN-HYDROCORTISONE (CORTISPORIN) 1 % SOLN OTIC solution Patient Preference  . cyclobenzaprine (FLEXERIL) 5 MG tablet Patient Preference  . prochlorperazine (COMPAZINE) 10 MG tablet Patient Preference     Recommendations:   Hannahrose Sellner [name]  is a 59 y.o. African American female with a history of Left Breast Cancer SP lumpectomy followed by radiation therapy and chemotherapy, diagnosis in January 2020, GERD and hypertension. She is presenting to the office today as a new patient for evaluation of recent dyspnea on exertion and marked fatigue.  Schedule for a Exercise Nuclear stress test to evaluate for myocardial ischemia. Will schedule for an echocardiogram.  I have added amlodipine 5 mg daily for uncontrolled hypertension.  Patient has significant cardiovascular factors that includes radiation therapy to her chest, moderate obesity, uncontrolled hypertension and aortic atherosclerosis noted on the CT scan. Patient instructed to start ASA  81mg  q daily for prophylaxis.  Start Lipitor 10 mg daily.  No clinical evidence heart failure.  I also suspect her dyspnea could be related to hypertension and hypertensive heart disease and diastolic dysfunction along with recent weight gain of 30+ pounds.  We will obtain baseline lipids, TSH.    Adrian Prows, MD, [facility] 02/02/2020, 9:32 AM [facility] [place] Pager: [phone] Office: [phone] If no  answer Cell [phone]

## 2020-02-02 ENCOUNTER — Encounter: Payer: Self-pay | Admitting: Cardiology

## 2020-02-02 ENCOUNTER — Other Ambulatory Visit: Payer: Self-pay

## 2020-02-02 ENCOUNTER — Ambulatory Visit: Payer: BC Managed Care – PPO | Admitting: Cardiology

## 2020-02-02 VITALS — BP 140/97 | HR 116 | Temp 96.9°F | Resp 18 | Ht 74.0 in | Wt 294.0 lb

## 2020-02-02 DIAGNOSIS — C50412 Malignant neoplasm of upper-outer quadrant of left female breast: Secondary | ICD-10-CM

## 2020-02-02 DIAGNOSIS — Z8249 Family history of ischemic heart disease and other diseases of the circulatory system: Secondary | ICD-10-CM

## 2020-02-02 DIAGNOSIS — E785 Hyperlipidemia, unspecified: Secondary | ICD-10-CM

## 2020-02-02 DIAGNOSIS — I700 Atherosclerosis of aorta: Secondary | ICD-10-CM

## 2020-02-02 DIAGNOSIS — I10 Essential (primary) hypertension: Secondary | ICD-10-CM

## 2020-02-02 DIAGNOSIS — R002 Palpitations: Secondary | ICD-10-CM

## 2020-02-02 DIAGNOSIS — R0609 Other forms of dyspnea: Secondary | ICD-10-CM

## 2020-02-02 MED ORDER — ASPIRIN EC 81 MG PO TBEC: 81.0000 mg | DELAYED_RELEASE_TABLET | Freq: Every day | ORAL | 3 refills | Status: DC

## 2020-02-02 MED ORDER — AMLODIPINE BESYLATE 5 MG PO TABS: 5.0000 mg | ORAL_TABLET | Freq: Every day | ORAL | 2 refills | Status: DC

## 2020-02-02 MED ORDER — ATORVASTATIN CALCIUM 10 MG PO TABS: 10.0000 mg | ORAL_TABLET | Freq: Every day | ORAL | 2 refills | Status: DC

## 2020-02-03 ENCOUNTER — Other Ambulatory Visit: Payer: Self-pay | Admitting: Physician Assistant

## 2020-02-03 DIAGNOSIS — M545 Low back pain, unspecified: Secondary | ICD-10-CM

## 2020-02-03 DIAGNOSIS — G8929 Other chronic pain: Secondary | ICD-10-CM

## 2020-02-03 LAB — TSH: TSH: 1.57 u[IU]/mL (ref 0.450–4.500)

## 2020-02-04 LAB — LIPID PANEL WITH LDL/HDL RATIO
Cholesterol, Total: 193 mg/dL (ref 100–199)
HDL: 49 mg/dL (ref >39)
LDL Chol Calc (NIH): 131 mg/dL — ABNORMAL HIGH (ref 0–99)
LDL/HDL Ratio: 2.7 ratio (ref 0.0–3.2)
Triglycerides: 73 mg/dL (ref 0–149)
VLDL Cholesterol Cal: 13 mg/dL (ref 5–40)

## 2020-02-04 LAB — LIPOPROTEIN A (LPA): Lipoprotein (a): 196.4 nmol/L — ABNORMAL HIGH (ref <75.0)

## 2020-02-16 ENCOUNTER — Other Ambulatory Visit: Payer: Self-pay | Admitting: Hematology and Oncology

## 2020-02-20 ENCOUNTER — Ambulatory Visit: Payer: BC Managed Care – PPO

## 2020-02-20 ENCOUNTER — Other Ambulatory Visit: Payer: Self-pay

## 2020-02-20 DIAGNOSIS — Z8249 Family history of ischemic heart disease and other diseases of the circulatory system: Secondary | ICD-10-CM

## 2020-02-20 DIAGNOSIS — I700 Atherosclerosis of aorta: Secondary | ICD-10-CM

## 2020-02-20 DIAGNOSIS — R0609 Other forms of dyspnea: Secondary | ICD-10-CM

## 2020-02-22 ENCOUNTER — Ambulatory Visit: Payer: BC Managed Care – PPO

## 2020-02-22 ENCOUNTER — Other Ambulatory Visit: Payer: Self-pay

## 2020-02-22 DIAGNOSIS — R0609 Other forms of dyspnea: Secondary | ICD-10-CM

## 2020-02-22 DIAGNOSIS — I700 Atherosclerosis of aorta: Secondary | ICD-10-CM

## 2020-02-26 NOTE — Progress Notes (Signed)
Normal stress test

## 2020-03-01 ENCOUNTER — Ambulatory Visit
Admission: RE | Admit: 2020-03-01 | Discharge: 2020-03-01 | Disposition: A | Discharge status: Home / Self Care | Payer: BC Managed Care – PPO | Source: Ambulatory Visit | Attending: Physician Assistant | Admitting: Physician Assistant

## 2020-03-01 DIAGNOSIS — M545 Low back pain, unspecified: Secondary | ICD-10-CM

## 2020-03-05 ENCOUNTER — Other Ambulatory Visit: Payer: Self-pay | Admitting: Hematology and Oncology

## 2020-03-16 NOTE — Progress Notes (Signed)
Primary Physician/Referring:  [name], NP  Patient ID: [name], female    DOB: 1961/06/24, 59 y.o.   MRN: [id number]  Chief Complaint  Patient presents with  . Shortness of Breath  . Follow-up    6 week  . Hyperlipidemia  . Hypertension   HPI:    [name]  is a 59 y.o. African American female with a history of Left Breast Cancer SP lumpectomy followed by radiation therapy and chemotherapy, diagnosis in January 2020, GERD and hypertension. She is presenting to the office today as a new patient for evaluation of recent dyspnea on exertion and marked fatigue.    She has markedly reduced her physical activity, no associated chest pain, no PND or orthopnea or leg edema.  Also on her last office visit I had added atorvastatin 10 mg along with amlodipine for hypertension, which is tolerating without any side effects.  Past Medical History:  Diagnosis Date  . Cancer [facility]    breast- left  . Complication of anesthesia   . Fatigue   . GERD (gastroesophageal reflux disease)    w nonobstructing esophageal stricture  . Headache   . Heat intolerance   . History of ETT 6/10   myoview EF 65%, normal wall motion, no ischemia or infarction, poor exercise capacity  . HTN (hypertension)   . Motion sickness   . Normal echocardiogram 6/10   EF 74-12% moderate diastolic dysfunction, mild LAE  . Numbness and tingling   . Obesity   . PONV (postoperative nausea and vomiting)   . S/P partial hysterectomy    Past Surgical History:  Procedure Laterality Date  . ABDOMINAL HYSTERECTOMY  2007   partial   . AXILLARY LYMPH NODE DISSECTION Left 03/11/2019   Procedure: LEFT AXILLARY LYMPH NODE DISSECTION;  Surgeon: Erroll Luna, MD;  Location: [place];  Service: General;  Laterality: Left;  . BREAST LUMPECTOMY WITH RADIOACTIVE SEED AND SENTINEL LYMPH NODE BIOPSY Left 02/22/2019   Procedure: LEFT BREAST LUMPECTOMY x2 WITH RADIOACTIVE SEED  AND LEFT AXILLA SEED  GUIDED LYMPH NODE BIOPSY AND LEFT SENTINEL LYMPH NODE MAPPING;  Surgeon: Erroll Luna, MD;  Location: [place];  Service: General;  Laterality: Left;  . CARPAL TUNNEL RELEASE Bilateral   . FOOT SURGERY Bilateral   . hysterectomy (other)    . PORT-A-CATH REMOVAL N/A 02/22/2019   Procedure: REMOVAL PORT-A-CATH;  Surgeon: Erroll Luna, MD;  Location: [place];  Service: General;  Laterality: N/A;  . PORTACATH PLACEMENT N/A 11/10/2018   Procedure: INSERTION PORT-A-CATH WITH ULTRASOUND;  Surgeon: Erroll Luna, MD;  Location: [place];  Service: General;  Laterality: N/A;  . TONSILLECTOMY  1968   Family History  Problem Relation Age of Onset  . Heart attack Mother 61  . Heart disease Mother   . Heart attack Maternal Grandmother        age 80 or 44  . Heart disease Maternal Grandmother   . Diabetes Sister   . Neuropathy Neg Hx    Social History   Tobacco Use  . Smoking status: Never Smoker  . Smokeless tobacco: Never Used  Substance Use Topics  . Alcohol use: No   Marital Status: Married  ROS  Review of Systems  Constitution: Positive for malaise/fatigue.  Cardiovascular: Positive for dyspnea on exertion and palpitations. Negative for chest pain and leg swelling.  Gastrointestinal: Negative for melena.   Objective  Blood pressure 119/87, pulse 89, resp. rate 16, height 6'  2" (1.88 m), weight 296 lb 8 oz (134.5 kg), SpO2 95 %.  Vitals with BMI 03/19/2020 02/02/2020 10/04/2019  Height 6\' 2"  6\' 2"  6\' 2"   Weight 296 lbs 8 oz 294 lbs 292 lbs 5 oz  BMI 38.05 65.53 74.82  Systolic 707 867 544  Diastolic 87 97 920  Pulse 89 116 112     Physical Exam  Constitutional: She appears well-developed. No distress.  Moderately obese  Cardiovascular: Normal rate, regular rhythm, normal heart sounds and intact distal pulses. Exam reveals no gallop.  No murmur heard. No leg edema, no JVD.  Pulmonary/Chest: Effort normal and breath  sounds normal.  Abdominal: Soft. Bowel sounds are normal.   Laboratory examination:   Recent Labs    10/12/19 1326  NA 142  K 3.6  CL 104  CO2 28  GLUCOSE 97  BUN 14  CREATININE 1.04*  CALCIUM 9.1  GFRNONAA 59*  GFRAA >60   CrCl cannot be calculated (Patient's most recent lab result is older than the maximum 21 days allowed.).  CMP Latest Ref Rng & Units 10/12/2019 02/16/2019 01/27/2019  Glucose 70 - 99 mg/dL 97 103(H) 104(H)  BUN 6 - 20 mg/dL 14 11 13   Creatinine 0.44 - 1.00 mg/dL 1.04(H) 0.85 0.82  Sodium 135 - 145 mmol/L 142 141 141  Potassium 3.5 - 5.1 mmol/L 3.6 3.3(L) 3.2(L)  Chloride 98 - 111 mmol/L 104 104 107  CO2 22 - 32 mmol/L 28 26 25   Calcium 8.9 - 10.3 mg/dL 9.1 9.5 8.9  Total Protein 6.5 - 8.1 g/dL 7.3 6.1(L) 6.6  Total Bilirubin 0.3 - 1.2 mg/dL 0.2(L) 0.4 0.4  Alkaline Phos 38 - 126 U/L 91 63 55  AST 15 - 41 U/L 14(L) 17 16  ALT 0 - 44 U/L 15 15 13    CBC Latest Ref Rng & Units 10/12/2019 02/16/2019 01/27/2019  WBC 4.0 - 10.5 K/uL 4.3 4.2 4.1  Hemoglobin 12.0 - 15.0 g/dL 11.9(L) 10.3(L) 8.7(L)  Hematocrit 36.0 - 46.0 % 36.6 31.5(L) 27.0(L)  Platelets 150 - 400 K/uL 174 180 193   Lipid Panel     Component Value Date/Time   CHOL 193 02/02/2020 1121   TRIG 73 02/02/2020 1121   HDL 49 02/02/2020 1121   LDLCALC 131 (H) 02/02/2020 1121   HEMOGLOBIN A1C Lab Results  Component Value Date   HGBA1C 5.7 (H) 05/13/2017   TSH Recent Labs    02/02/20 1120  TSH 1.570    External labs :  11/03/2017: Total cholesterol 181, triglycerides 89, HDL 45, LDL 118. TSH normal.  A1c 5.4%.  Medications and allergies  No Known Allergies   Current Outpatient Medications  Medication Instructions  . amLODipine (NORVASC) 5 mg, Oral, Daily  . anastrozole (ARIMIDEX) 1 MG tablet TAKE 1 TABLET BY MOUTH EVERY DAY  . aspirin EC 81 mg, Oral, Daily  . atorvastatin (LIPITOR) 10 mg, Oral, Daily  . carvedilol (COREG) 6.25 MG tablet TAKE 1 TABLET BY MOUTH TWICE DAILY  .  lisinopril-hydrochlorothiazide (ZESTORETIC) 20-25 MG tablet 1 tablet, Oral, Daily  . naproxen (NAPROSYN) 500 MG tablet naproxen 500 mg tablet  . NONFORMULARY OR COMPOUNDED ITEM Creams Peripheral Neuropathy Cream: Bupivacaine 1% Doxepin3% Gabapentin 6% Pentoxifyline 3% Topiramate 1% Sig: Apply 1 to 2 gm to affected areaqs 3-4x daily prn 200gmRefills PRN  . pantoprazole (PROTONIX) 40 MG tablet TAKE 1 TABLET BY MOUTH EVERY DAY  . pregabalin (LYRICA) 50 MG capsule pregabalin 50 mg capsule  TAKE 1 CAPSULE BY MOUTH THREE  TIMES A DAY   Medications Discontinued During This Encounter  Medication Reason  . NON FORMULARY Duplicate  . silver sulfADIAZINE (SILVADENE) 1 % cream No longer needed (for PRN medications)    Radiology:   CT Chest, Abdomen, Pelvis W Contrast 10/11/2019: Cardiovascular: Bovine arch. Aortic atherosclerosis. Mild cardiomegaly, without pericardial effusion. No central pulmonary embolism, on this non-dedicated study. IMPRESSION: 1. Primarily right-sided pulmonary nodules. Although these are subpleural in distribution and could represent subpleural lymph nodes, given size, pulmonary metastasis are a concern. These are below PET resolution. Consider CT follow-up at 3-6 months. 2. Indeterminate right hepatic lobe lesion is too small to characterize at CT. Consider further evaluation with pre and post contrast abdominal MRI 3. Otherwise, no evidence of metastatic disease. 4. Small hiatal hernia. 5.  Aortic Atherosclerosis (ICD10-I70.0). 6. Equivocal bladder wall edema, at least partially felt to be due to underdistention. Consider correlation with urinalysis.  CT Chest W Contrast 12/19/2019: Cardiovascular:  No acute findings. IMPRESSION: Stable sub-cm pulmonary nodules, suggesting benign etiology. Given patient's history of cancer, continued follow-up by chest CT is recommended in 6 months. Stable small hiatal hernia.  Cardiac Studies:   Transthoracic Echocardiography  11/05/2018: - Left ventricle: The cavity size was normal. Wall thickness was normal. Systolic function was normal.  The estimated ejection fraction was in the range of 55% to 60%. Wall motion was normal; there were no regional wall motion abnormalities. Left  ventricular diastolic function parameters were normal.  - Left atrium: The atrium was mildly dilated.  - Atrial septum: No defect or patent foramen ovale was identified.  - Impressions: Low normal GLS -16.1.   Echocardiogram 02/22/2020:  Left ventricle cavity is normal in size. Mild concentric hypertrophy of the left ventricle. Normal global wall motion.  Normal LV systolic function with EF 56%. Normal diastolic filling pattern.  No significant valvular abnormality. Normal right atrial pressure.   Lexiscan/modified Bruce Sestamibi stress test 02/20/2020: No previous exam available for comparison. Lexiscan/modified Bruce nuclear stress test performed using 1-day protocol. Stress EKG is non-diagnostic, as this is pharmacological stress test. Additionally, stress EKG at 84% MPHR showed sinus tachycardia, no ischemic changes.  Normal stress myocardial perfusion. Decreased tracer uptake in apical inferior myocardium likely due to tissue attenuation artifact. Stress LVEF 50%. Low risk study.   EKG   02/02/2020: Normal sinus rhythm with rate of 93 beats a minute, normal axis, no evidence of ischemia.  Normal EKG.   No significant change from ECG 12/27/2018 in ER: Sinus tachycardia. No Stemi. Otherwise normal ECG.    Assessment     ICD-10-CM   1. Dyspnea on exertion  R06.00   2. Essential hypertension  I10   3. Aortic atherosclerosis (HCC)  I70.0   4. Mild hyperlipidemia  E78.5       Recommendations:   Jagger Beahm Hayden  is a 59 y.o. African American female with a history of Left Breast Cancer SP lumpectomy followed by radiation therapy and chemotherapy, diagnosis in January 2020, GERD and hypertension. She is presenting to  the office today as a new patient for evaluation of recent dyspnea on exertion and marked fatigue.  In view of aortic atherosclerosis noted on the CT scan, I have been aggressive with management of her lipids, she is presently tolerating atorvastatin 10 mg without any side effects.  Her blood pressure today is also well controlled on amlodipine in addition, advised her to continue the same for now.  I reviewed her stress test and echocardiogram, fortunately all low  risk.  No significant abnormality noted.  Weight loss was again discussed with the patient.  I have recommended that she follow-up with [name], NP for her routine physical including lipid profile checking in 4 to 6 weeks, and then she can follow-up with her for further management and see me back on a as needed basis.  All questions answered.   Adrian Prows, MD, [facility] 03/19/2020, 9:46 AM [facility] [place] Pager: [phone] Office: [phone]

## 2020-03-19 ENCOUNTER — Telehealth: Payer: Self-pay | Admitting: *Deleted

## 2020-03-19 ENCOUNTER — Ambulatory Visit: Payer: BC Managed Care – PPO | Admitting: Cardiology

## 2020-03-19 ENCOUNTER — Other Ambulatory Visit: Payer: Self-pay

## 2020-03-19 ENCOUNTER — Encounter: Payer: Self-pay | Admitting: Cardiology

## 2020-03-19 VITALS — BP 119/87 | HR 89 | Resp 16 | Ht 74.0 in | Wt 296.5 lb

## 2020-03-19 DIAGNOSIS — I10 Essential (primary) hypertension: Secondary | ICD-10-CM

## 2020-03-19 DIAGNOSIS — E785 Hyperlipidemia, unspecified: Secondary | ICD-10-CM

## 2020-03-19 DIAGNOSIS — R0609 Other forms of dyspnea: Secondary | ICD-10-CM

## 2020-03-19 DIAGNOSIS — I700 Atherosclerosis of aorta: Secondary | ICD-10-CM

## 2020-03-19 NOTE — Progress Notes (Signed)
Patient Care Team: [name], NP as PCP - General Erroll Luna, MD as Consulting Physician (General Surgery) Nicholas Lose, MD as Consulting Physician (Hematology and Oncology) Kyung Rudd, MD as Consulting Physician (Radiation Oncology)  DIAGNOSIS:    ICD-10-CM   1. Malignant neoplasm of upper-outer quadrant of left breast in female, estrogen receptor positive ([place])  C50.412    Z17.0     SUMMARY OF ONCOLOGIC HISTORY: Oncology History  Malignant neoplasm of upper-outer quadrant of left breast in female, estrogen receptor positive ([place])  10/22/2018 Initial Diagnosis   Screening detected left breast architectural distortion: 4.5 cm, UOQ, by ultrasound measured 5 cm at 1 o'clock position multiple enlarged lymph nodes, biopsy revealed grade 1 IDC with DCIS with lymphovascular invasion, lymph node biopsy positive, intramammary lymph node biopsy negative, ER 100%, PR 100%, Ki-67 20%, HER-2 1+ by IHC, T2 and 1 stage 2A   10/27/2018 Cancer Staging   Staging form: Breast, AJCC 8th Edition - Clinical: Stage IIA (cT2, cN1(f), cM0, G1, ER+, PR+, HER2-) - Signed by Nicholas Lose, MD on 11/01/2018   11/11/2018 - 12/20/2018 Neo-Adjuvant Chemotherapy   Neoadjuvant chemotherapy with dose dense Adriamycin and Cytoxan every 2 weeks x4 followed by Taxol weekly x12 (stopped after cycle 3 due to severe neuropathy)   02/01/2019 Breast MRI   Known malignancy in left breast measures 4.7 x 3.5 x 7.2cm, previously 5 x 3 x 10cm.    02/22/2019 Surgery   Left lumpectomy ([facility]): IDC with DCIS, 5.8cm, HER2 negative, ER 90%, PR 90%, Ki67 2%, clear margins,3/5 LN positive for malignancy.    03/02/2019 Cancer Staging   Staging form: Breast, AJCC 8th Edition - Pathologic stage from 03/02/2019: No Stage Recommended (ypT3, pN1a, cM0, G2, ER+, PR+, HER2-) - Signed by Nicholas Lose, MD on 03/02/2019   03/11/2019 Surgery   Left axillary lymph node dissection ([facility]): metastatic carcinoma in 3/14 lymph nodes,  focal extranodal involvement by tumor.   04/20/2019 - 06/02/2019 Radiation Therapy   Adjuvant radiation therapy   05/30/2019 -  Anti-estrogen oral therapy   Anastrozole 1 mg daily     CHIEF COMPLIANT: Follow-up of left breast cancer on anastrozole  INTERVAL HISTORY: [name] is a 59 y.o. with above-mentioned history of left breast cancer treated with neoadjuvant chemotherapy, lumpectomy and axillary lymph node dissection, radiation, and who is currently onanti-estrogen therapywith anastrozole. She presents to the clinic todayfor follow-up.   ALLERGIES:  has No Known Allergies.  MEDICATIONS:  Current Outpatient Medications  Medication Sig Dispense Refill  . amLODipine (NORVASC) 5 MG tablet Take 1 tablet (5 mg total) by mouth daily. 30 tablet 2  . anastrozole (ARIMIDEX) 1 MG tablet Take 1 tablet (1 mg total) by mouth daily. 90 tablet 3  . aspirin EC 81 MG tablet Take 1 tablet (81 mg total) by mouth daily. 90 tablet 3  . atorvastatin (LIPITOR) 10 MG tablet Take 1 tablet (10 mg total) by mouth daily. 30 tablet 2  . carvedilol (COREG) 6.25 MG tablet TAKE 1 TABLET BY MOUTH TWICE DAILY (Patient taking differently: Take 6.25 mg by mouth 2 (two) times daily with a meal. ) 180 tablet 1  . lisinopril-hydrochlorothiazide (ZESTORETIC) 20-25 MG tablet Take 1 tablet by mouth daily.    . naproxen (NAPROSYN) 500 MG tablet naproxen 500 mg tablet    . NONFORMULARY OR COMPOUNDED ITEM Creams Peripheral Neuropathy Cream: Bupivacaine 1% Doxepin3% Gabapentin 6% Pentoxifyline 3% Topiramate 1% Sig: Apply 1 to 2 gm to affected areaqs 3-4x daily prn 200gm  Refills PRN    . pantoprazole (PROTONIX) 40 MG tablet TAKE 1 TABLET BY MOUTH EVERY DAY 90 tablet 0  . pregabalin (LYRICA) 50 MG capsule pregabalin 50 mg capsule  TAKE 1 CAPSULE BY MOUTH THREE TIMES A DAY     No current facility-administered medications for this visit.   Facility-Administered Medications Ordered in Other Visits  Medication  Dose Route Frequency Provider Last Rate Last Admin  . gadopentetate dimeglumine (MAGNEVIST) injection 20 mL  20 mL Intravenous Once PRN Sarina Ill B, MD      . gadopentetate dimeglumine (MAGNEVIST) injection 20 mL  20 mL Intravenous Once PRN Melvenia Beam, MD        PHYSICAL EXAMINATION: ECOG PERFORMANCE STATUS: 1 - Symptomatic but completely ambulatory  Vitals:   03/20/20 0817  BP: 124/86  Pulse: 94  Resp: 18  Temp: 98.3 F (36.8 C)  SpO2: 98%   Filed Weights   03/20/20 0817  Weight: 294 lb 12.8 oz (133.7 kg)    BREAST: No palpable masses or nodules in either right or left breasts. No palpable axillary supraclavicular or infraclavicular adenopathy no breast tenderness or nipple discharge. (exam performed in the presence of a chaperone)  LABORATORY DATA:  I have reviewed the data as listed CMP Latest Ref Rng & Units 10/12/2019 02/16/2019 01/27/2019  Glucose 70 - 99 mg/dL 97 103(H) 104(H)  BUN 6 - 20 mg/dL '14 11 13  '$ Creatinine 0.44 - 1.00 mg/dL 1.04(H) 0.85 0.82  Sodium 135 - 145 mmol/L 142 141 141  Potassium 3.5 - 5.1 mmol/L 3.6 3.3(L) 3.2(L)  Chloride 98 - 111 mmol/L 104 104 107  CO2 22 - 32 mmol/L '28 26 25  '$ Calcium 8.9 - 10.3 mg/dL 9.1 9.5 8.9  Total Protein 6.5 - 8.1 g/dL 7.3 6.1(L) 6.6  Total Bilirubin 0.3 - 1.2 mg/dL 0.2(L) 0.4 0.4  Alkaline Phos 38 - 126 U/L 91 63 55  AST 15 - 41 U/L 14(L) 17 16  ALT 0 - 44 U/L '15 15 13    '$ Lab Results  Component Value Date   WBC 4.3 10/12/2019   HGB 11.9 (L) 10/12/2019   HCT 36.6 10/12/2019   MCV 84.9 10/12/2019   PLT 174 10/12/2019   NEUTROABS 2.9 10/12/2019    ASSESSMENT & PLAN:  Malignant neoplasm of upper-outer quadrant of left breast in female, estrogen receptor positive ([place]) 10/22/2018:Screening detected left breast architectural distortion: 4.5 cm, UOQ, by ultrasound measured 5 cm at 1 o'clock position multiple enlarged lymph nodes, biopsy revealed grade 1 IDC with DCIS with lymphovascular invasion, lymph node  biopsy positive, intramammary lymph node biopsy negative, ER 100%, PR 100%, Ki-67 20%, HER-2 1+ by IHC, T2 and 1 stage Ib 11/01/2018: MRI biopsy left breast IDC grade 1, ER PR positive HER-2 negative  Left lumpectomy: 02/22/2019:([facility]): IDC with DCIS, 5.8cm,margins negative, lymphovascular invasion presentHER2 negative, ER 90%, PR 90%, Ki67 2%, clear margins,3/5 LN positive for malignancy. AX L&D 03/11/2019: 3/14 lymph nodes positive with focal extranodal involvementypT3ypN2a  Treatment plan: 1.Neoadjuvant chemotherapy with dose dense Adriamycin and Cytoxan every 2 weeks x4 followed by Taxol weekly x3discontinued due to severe peripheral neuropathycompleted 01/20/2019 2.Followed by [facility] 02/22/2019 3.Followed by radiation7/05/2019-06/02/2019 4.Followed by antiestrogen therapy.06/02/2019 ----------------------------------------------------------------------------------------------------------------------------------------------------- Current treatment: Anastrozole started 06/02/2019 Anastrozole toxicities:Denies any adverse effects to anastrozole.  Lung nodules: Repeat CT scan 12/19/2019: Stable subcentimeter lung nodules bilaterally suggesting benign etiology Recheck CT in 6 months  Breast cancer surveillance: Mammogram 10/12/2019: Benign breast density category B Bone density 10/25/2019: T score -0.9:  Normal Liver MRI 10/13/2019: No findings of metastatic disease.  Indeterminate right hepatic lobe lesion favored to be a cyst  Return to clinic in 3 months for follow-up after CT chest    No orders of the defined types were placed in this encounter.  The patient has a good understanding of the overall plan. she agrees with it. she will call with any problems that may develop before the next visit here.  Total time spent: 20 mins including face to face time and time spent for planning, charting and coordination of care  Nicholas Lose, MD 03/20/2020  I, Cloyde Reams  Dorshimer, am acting as scribe for Dr. Nicholas Lose.  I have reviewed the above documentation for accuracy and completeness, and I agree with the above.

## 2020-03-19 NOTE — Telephone Encounter (Signed)
Pt called to r/s appt with Dr. Lindi Adie. Scheduled and confirmed new appt for 03/20/20 at 8:15.

## 2020-03-20 ENCOUNTER — Inpatient Hospital Stay: Payer: BC Managed Care – PPO | Attending: Adult Health | Admitting: Hematology and Oncology

## 2020-03-20 ENCOUNTER — Other Ambulatory Visit: Payer: Self-pay

## 2020-03-20 DIAGNOSIS — Z170 Estrogen receptor positive status [ER+]: Secondary | ICD-10-CM | POA: Diagnosis not present

## 2020-03-20 DIAGNOSIS — R918 Other nonspecific abnormal finding of lung field: Secondary | ICD-10-CM | POA: Insufficient documentation

## 2020-03-20 DIAGNOSIS — Z9221 Personal history of antineoplastic chemotherapy: Secondary | ICD-10-CM | POA: Diagnosis not present

## 2020-03-20 DIAGNOSIS — Z923 Personal history of irradiation: Secondary | ICD-10-CM | POA: Diagnosis not present

## 2020-03-20 DIAGNOSIS — C50412 Malignant neoplasm of upper-outer quadrant of left female breast: Secondary | ICD-10-CM | POA: Diagnosis present

## 2020-03-20 DIAGNOSIS — C773 Secondary and unspecified malignant neoplasm of axilla and upper limb lymph nodes: Secondary | ICD-10-CM | POA: Diagnosis not present

## 2020-03-20 MED ORDER — ANASTROZOLE 1 MG PO TABS: 1.0000 mg | ORAL_TABLET | Freq: Every day | ORAL | 3 refills | Status: DC

## 2020-03-20 NOTE — Assessment & Plan Note (Signed)
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 AX L&D 03/11/2019: 3/14 lymph nodes positive with focal extranodal involvementypT3ypN2a  Treatment plan: 1.Neoadjuvant chemotherapy with dose dense Adriamycin and Cytoxan every 2 weeks x4 followed by Taxol weekly x3discontinued due to severe peripheral neuropathycompleted 01/20/2019 2.Followed by [facility] 02/22/2019 3.Followed by radiation7/05/2019-06/02/2019 4.Followed by antiestrogen therapy.06/02/2019 ----------------------------------------------------------------------------------------------------------------------------------------------------- Current treatment: Anastrozole started 06/02/2019 Anastrozole toxicities:Denies any adverse effects to anastrozole.  Lung nodules: Repeat CT scan 12/19/2019: Stable subcentimeter lung nodules bilaterally suggesting benign etiology Recheck CT in 6 months  Breast cancer surveillance: Mammogram 10/12/2019: Benign breast density category B Bone density 10/25/2019: T score -0.9: Normal Liver MRI 10/13/2019: No findings of metastatic disease.  Indeterminate right hepatic lobe lesion favored to be a cyst  Return to clinic in 3 months for follow-up after CT chest

## 2020-03-23 ENCOUNTER — Telehealth: Payer: Self-pay | Admitting: Hematology and Oncology

## 2020-03-23 NOTE — Telephone Encounter (Signed)
Scheduled per 06/08 los, patient has been called and notified. 

## 2020-03-28 ENCOUNTER — Ambulatory Visit: Payer: BC Managed Care – PPO | Admitting: Podiatry

## 2020-03-30 ENCOUNTER — Other Ambulatory Visit: Payer: Self-pay | Admitting: Surgery

## 2020-03-30 DIAGNOSIS — C50912 Malignant neoplasm of unspecified site of left female breast: Secondary | ICD-10-CM

## 2020-04-03 ENCOUNTER — Ambulatory Visit: Payer: BC Managed Care – PPO | Admitting: Hematology and Oncology

## 2020-04-08 ENCOUNTER — Other Ambulatory Visit: Payer: Self-pay | Admitting: Cardiology

## 2020-04-08 ENCOUNTER — Other Ambulatory Visit: Payer: Self-pay | Admitting: Hematology and Oncology

## 2020-04-08 DIAGNOSIS — I10 Essential (primary) hypertension: Secondary | ICD-10-CM

## 2020-04-08 DIAGNOSIS — I700 Atherosclerosis of aorta: Secondary | ICD-10-CM

## 2020-05-12 ENCOUNTER — Other Ambulatory Visit: Payer: Self-pay | Admitting: Hematology and Oncology

## 2020-06-05 ENCOUNTER — Other Ambulatory Visit: Payer: Self-pay | Admitting: *Deleted

## 2020-06-05 ENCOUNTER — Telehealth: Payer: Self-pay | Admitting: *Deleted

## 2020-06-05 DIAGNOSIS — Z170 Estrogen receptor positive status [ER+]: Secondary | ICD-10-CM

## 2020-06-05 NOTE — Telephone Encounter (Signed)
R/s CT chest scan to 9/7 at 3:45, scheduled labs prior to CT scan at 3:15. Confirmed appts for lab, ct scan and Dr. Lindi Adie. Denies further needs at this time.

## 2020-06-15 ENCOUNTER — Telehealth: Payer: Self-pay | Admitting: Hematology and Oncology

## 2020-06-15 ENCOUNTER — Telehealth: Payer: Self-pay | Admitting: *Deleted

## 2020-06-15 NOTE — Telephone Encounter (Signed)
Pt called regarding cancellation of CT scan.  Called central scheduling and was notified CT was cancel d/t short staff. Informed pt of reasoning. Was given days of when pt is available for CT Scheduled and confirmed appt for CT chest scan on 9/14 at 3:15pm, and labs at 3pm Confirmed appts with pt. No further needs voiced.

## 2020-06-15 NOTE — Telephone Encounter (Signed)
Rescheduled appointment per 9/3 scheduling message. Patient is aware of appointment change.

## 2020-06-19 ENCOUNTER — Ambulatory Visit (HOSPITAL_COMMUNITY): Payer: BC Managed Care – PPO

## 2020-06-19 ENCOUNTER — Other Ambulatory Visit: Payer: BC Managed Care – PPO

## 2020-06-20 ENCOUNTER — Telehealth: Payer: BC Managed Care – PPO | Admitting: Hematology and Oncology

## 2020-06-20 ENCOUNTER — Other Ambulatory Visit: Payer: BC Managed Care – PPO

## 2020-06-21 ENCOUNTER — Other Ambulatory Visit (HOSPITAL_COMMUNITY): Payer: BC Managed Care – PPO

## 2020-06-21 ENCOUNTER — Ambulatory Visit: Payer: BC Managed Care – PPO | Admitting: Hematology and Oncology

## 2020-06-21 ENCOUNTER — Ambulatory Visit: Payer: BC Managed Care – PPO | Admitting: Hematology

## 2020-06-26 ENCOUNTER — Other Ambulatory Visit: Payer: Self-pay

## 2020-06-26 ENCOUNTER — Other Ambulatory Visit: Payer: Self-pay | Admitting: *Deleted

## 2020-06-26 ENCOUNTER — Inpatient Hospital Stay: Payer: BC Managed Care – PPO | Attending: Adult Health

## 2020-06-26 ENCOUNTER — Ambulatory Visit (HOSPITAL_COMMUNITY)
Admission: RE | Admit: 2020-06-26 | Discharge: 2020-06-26 | Disposition: A | Discharge status: Home / Self Care | Payer: BC Managed Care – PPO | Source: Ambulatory Visit | Attending: Hematology and Oncology | Admitting: Hematology and Oncology

## 2020-06-26 DIAGNOSIS — R918 Other nonspecific abnormal finding of lung field: Secondary | ICD-10-CM | POA: Diagnosis present

## 2020-06-26 DIAGNOSIS — Z79899 Other long term (current) drug therapy: Secondary | ICD-10-CM | POA: Diagnosis not present

## 2020-06-26 DIAGNOSIS — C50412 Malignant neoplasm of upper-outer quadrant of left female breast: Secondary | ICD-10-CM | POA: Insufficient documentation

## 2020-06-26 DIAGNOSIS — Z170 Estrogen receptor positive status [ER+]: Secondary | ICD-10-CM

## 2020-06-26 LAB — CBC WITH DIFFERENTIAL (CANCER CENTER ONLY)
Abs Immature Granulocytes: 0.01 10*3/uL (ref 0.00–0.07)
Basophils Absolute: 0 10*3/uL (ref 0.0–0.1)
Basophils Relative: 0 %
Eosinophils Absolute: 0.1 10*3/uL (ref 0.0–0.5)
Eosinophils Relative: 3 %
HCT: 35.9 % — ABNORMAL LOW (ref 36.0–46.0)
Hemoglobin: 11.6 g/dL — ABNORMAL LOW (ref 12.0–15.0)
Immature Granulocytes: 0 %
Lymphocytes Relative: 30 %
Lymphs Abs: 1.3 10*3/uL (ref 0.7–4.0)
MCH: 27.7 pg (ref 26.0–34.0)
MCHC: 32.3 g/dL (ref 30.0–36.0)
MCV: 85.7 fL (ref 80.0–100.0)
Monocytes Absolute: 0.4 10*3/uL (ref 0.1–1.0)
Monocytes Relative: 10 %
Neutro Abs: 2.4 10*3/uL (ref 1.7–7.7)
Neutrophils Relative %: 57 %
Platelet Count: 159 10*3/uL (ref 150–400)
RBC: 4.19 MIL/uL (ref 3.87–5.11)
RDW: 12.3 % (ref 11.5–15.5)
WBC Count: 4.2 10*3/uL (ref 4.0–10.5)
nRBC: 0 % (ref 0.0–0.2)

## 2020-06-26 LAB — CMP (CANCER CENTER ONLY)
ALT: 14 U/L (ref 0–44)
AST: 17 U/L (ref 15–41)
Albumin: 3.6 g/dL (ref 3.5–5.0)
Alkaline Phosphatase: 81 U/L (ref 38–126)
Anion gap: 6 (ref 5–15)
BUN: 20 mg/dL (ref 6–20)
CO2: 29 mmol/L (ref 22–32)
Calcium: 9.4 mg/dL (ref 8.9–10.3)
Chloride: 104 mmol/L (ref 98–111)
Creatinine: 1.07 mg/dL — ABNORMAL HIGH (ref 0.44–1.00)
GFR, Est AFR Am: >60 mL/min (ref >60)
GFR, Estimated: 57 mL/min — ABNORMAL LOW (ref >60)
Glucose, Bld: 89 mg/dL (ref 70–99)
Potassium: 3.5 mmol/L (ref 3.5–5.1)
Sodium: 139 mmol/L (ref 135–145)
Total Bilirubin: 0.4 mg/dL (ref 0.3–1.2)
Total Protein: 7 g/dL (ref 6.5–8.1)

## 2020-06-26 LAB — LIPID PANEL
Cholesterol: 141 mg/dL (ref 0–200)
HDL: 42 mg/dL
LDL Cholesterol: 81 mg/dL (ref 0–99)
Total CHOL/HDL Ratio: 3.4 ratio
Triglycerides: 91 mg/dL
VLDL: 18 mg/dL (ref 0–40)

## 2020-06-26 MED ORDER — IOHEXOL 300 MG/ML  SOLN
75.0000 mL | Freq: Once | INTRAMUSCULAR | Status: AC | PRN
  Administered 2020-06-26: 75 mL via INTRAVENOUS

## 2020-06-27 LAB — TSH: TSH: 1.566 u[IU]/mL (ref 0.308–3.960)

## 2020-06-27 LAB — LIPOPROTEIN A (LPA): Lipoprotein (a): 221 nmol/L — ABNORMAL HIGH (ref <75.0)

## 2020-06-27 NOTE — Progress Notes (Signed)
HEMATOLOGY-ONCOLOGY MYCHART VIDEO VISIT PROGRESS NOTE  I connected with [name] on 06/28/2020 at  2:30 PM EDT by MyChart video conference and verified that I am speaking with the correct person using two identifiers.  I discussed the limitations, risks, security and privacy concerns of performing an evaluation and management service by MyChart and the availability of in person appointments.  I also discussed with the patient that there may be a patient responsible charge related to this service. The patient expressed understanding and agreed to proceed.  Patient's Location: Home Physician Location: Clinic  CHIEF COMPLIANT: Follow-up of left breast cancer on anastrozole, review CT  INTERVAL HISTORY: [name] is a 59 y.o. female with above-mentioned history of left breast cancer treated with neoadjuvant chemotherapy, lumpectomy and axillary lymph node dissection, radiation, and who is currently onanti-estrogen therapywith anastrozole.Chest CT on 06/26/20 showed stable bilateral lower lobe pulmonary nodules and benign hepatic cysts. She presents over MyChart todayfor follow-up to review her scan.  Oncology History  Malignant neoplasm of upper-outer quadrant of left breast in female, estrogen receptor positive ([place])  10/22/2018 Initial Diagnosis   Screening detected left breast architectural distortion: 4.5 cm, UOQ, by ultrasound measured 5 cm at 1 o'clock position multiple enlarged lymph nodes, biopsy revealed grade 1 IDC with DCIS with lymphovascular invasion, lymph node biopsy positive, intramammary lymph node biopsy negative, ER 100%, PR 100%, Ki-67 20%, HER-2 1+ by IHC, T2 and 1 stage 2A   10/27/2018 Cancer Staging   Staging form: Breast, AJCC 8th Edition - Clinical: Stage IIA (cT2, cN1(f), cM0, G1, ER+, PR+, HER2-) - Signed by Nicholas Lose, MD on 11/01/2018   11/11/2018 - 12/20/2018 Neo-Adjuvant Chemotherapy   Neoadjuvant chemotherapy with dose dense  Adriamycin and Cytoxan every 2 weeks x4 followed by Taxol weekly x12 (stopped after cycle 3 due to severe neuropathy)   02/01/2019 Breast MRI   Known malignancy in left breast measures 4.7 x 3.5 x 7.2cm, previously 5 x 3 x 10cm.    02/22/2019 Surgery   Left lumpectomy ([facility]): IDC with DCIS, 5.8cm, HER2 negative, ER 90%, PR 90%, Ki67 2%, clear margins,3/5 LN positive for malignancy.    03/02/2019 Cancer Staging   Staging form: Breast, AJCC 8th Edition - Pathologic stage from 03/02/2019: No Stage Recommended (ypT3, pN1a, cM0, G2, ER+, PR+, HER2-) - Signed by Nicholas Lose, MD on 03/02/2019   03/11/2019 Surgery   Left axillary lymph node dissection ([facility]): metastatic carcinoma in 3/14 lymph nodes, focal extranodal involvement by tumor.   04/20/2019 - 06/02/2019 Radiation Therapy   Adjuvant radiation therapy   05/30/2019 -  Anti-estrogen oral therapy   Anastrozole 1 mg daily      Observations/Objective:  There were no vitals filed for this visit. There is no height or weight on file to calculate BMI.  I have reviewed the data as listed CMP Latest Ref Rng & Units 06/26/2020 10/12/2019 02/16/2019  Glucose 70 - 99 mg/dL 89 97 103(H)  BUN 6 - 20 mg/dL _0 Creatinine 0.44 - 1.00 mg/dL 1.07(H) 1.04(H) 0.85  Sodium 135 - 145 mmol/L 139 142 141  Potassium 3.5 - 5.1 mmol/L 3.5 3.6 3.3(L)  Chloride 98 - 111 mmol/L 104 104 104  CO2 22 - 32 mmol/L _1 Calcium 8.9 - 10.3 mg/dL 9.4 9.1 9.5  Total Protein 6.5 - 8.1 g/dL 7.0 7.3 6.1(L)  Total Bilirubin 0.3 - 1.2 mg/dL 0.4 0.2(L) 0.4  Alkaline Phos 38 - 126 U/L 81 91 63  AST 15 - 41 U/L 17 14(L) 17  ALT 0 - 44 U/L _0 Lab Results  Component Value Date   WBC 4.2 06/26/2020   HGB 11.6 (L) 06/26/2020   HCT 35.9 (L) 06/26/2020   MCV 85.7 06/26/2020   PLT 159 06/26/2020   NEUTROABS 2.4 06/26/2020      Assessment Plan:  Malignant neoplasm of upper-outer quadrant of left breast in female, estrogen receptor positive  ([place]) 10/22/2018:Screening detected left breast architectural distortion: 4.5 cm, UOQ, by ultrasound measured 5 cm at 1 o'clock position multiple enlarged lymph nodes, biopsy revealed grade 1 IDC with DCIS with lymphovascular invasion, lymph node biopsy positive, intramammary lymph node biopsy negative, ER 100%, PR 100%, Ki-67 20%, HER-2 1+ by IHC, T2 and 1 stage Ib 11/01/2018: MRI biopsy left breast IDC grade 1, ER PR positive HER-2 negative  Left lumpectomy: 02/22/2019:([facility]): IDC with DCIS, 5.8cm,margins negative, lymphovascular invasion presentHER2 negative, ER 90%, PR 90%, Ki67 2%, clear margins,3/5 LN positive for malignancy. AX L&D 03/11/2019: 3/14 lymph nodes positive with focal extranodal involvementypT3ypN2a  Treatment plan: 1.Neoadjuvant chemotherapy with dose dense Adriamycin and Cytoxan every 2 weeks x4 followed by Taxol weekly x3discontinued due to severe peripheral neuropathycompleted 01/20/2019 2.Followed [facility] 02/22/2019 3.Followed by radiation7/05/2019-06/02/2019 4.Followed by antiestrogen therapy.06/02/2019 ----------------------------------------------------------------------------------------------------------------------------------------------------- Current treatment: Anastrozole started 06/02/2019 Anastrozole toxicities:Hot flashes (night sweats)  Lung nodules: Repeat CT scan 12/19/2019: Stable subcentimeter lung nodules bilaterally suggesting benign etiology CT 06/27/2020: Bilateral lower lobe pulmonary nodules.  Overall impression is stable nodules (some mild increase from 3 to 5 mm and 4 to 5 mm and 5 to 7 mm   Breast cancer surveillance: Mammogram 10/12/2019: Benign breast density category B Bone density 10/25/2019: T score -0.9: Normal Liver MRI 10/13/2019: No findings of metastatic disease. Indeterminate right hepatic lobe lesion favored to be a cyst  We will continue to do surveillance scans and follow-ups for her annual cancer  care  Return to clinic in 3 months for follow-up after CT chest.  I discussed the assessment and treatment plan with the patient. The patient was provided an opportunity to ask questions and all were answered. The patient agreed with the plan and demonstrated an understanding of the instructions. The patient was advised to call back or seek an in-person evaluation if the symptoms worsen or if the condition fails to improve as anticipated.   I provided 30 minutes of face-to-face MyChart video visit time during this encounter.    Rulon Eisenmenger, MD 06/28/2020   I, Molly Dorshimer, am acting as scribe for Nicholas Lose, MD.  I have reviewed the above documentation for accuracy and completeness, and I agree with the above.

## 2020-06-28 ENCOUNTER — Telehealth (HOSPITAL_BASED_OUTPATIENT_CLINIC_OR_DEPARTMENT_OTHER): Payer: BC Managed Care – PPO | Admitting: Hematology and Oncology

## 2020-06-28 DIAGNOSIS — Z170 Estrogen receptor positive status [ER+]: Secondary | ICD-10-CM | POA: Diagnosis not present

## 2020-06-28 DIAGNOSIS — C50412 Malignant neoplasm of upper-outer quadrant of left female breast: Secondary | ICD-10-CM | POA: Diagnosis not present

## 2020-06-28 DIAGNOSIS — I700 Atherosclerosis of aorta: Secondary | ICD-10-CM | POA: Diagnosis not present

## 2020-06-28 MED ORDER — PREGABALIN 50 MG PO CAPS: 50.0000 mg | ORAL_CAPSULE | Freq: Three times a day (TID) | ORAL | 6 refills | Status: DC

## 2020-06-28 MED ORDER — NAPROXEN 500 MG PO TABS
500.0000 mg | ORAL_TABLET | Freq: Two times a day (BID) | ORAL | 3 refills | Status: DC
Start: 2020-06-28 — End: 2023-07-31

## 2020-06-28 MED ORDER — ANASTROZOLE 1 MG PO TABS
1.0000 mg | ORAL_TABLET | Freq: Every day | ORAL | 3 refills | Status: DC
Start: 2020-06-28 — End: 2021-02-11

## 2020-06-28 MED ORDER — ATORVASTATIN CALCIUM 10 MG PO TABS: 10.0000 mg | ORAL_TABLET | Freq: Every day | ORAL | 3 refills | Status: DC

## 2020-06-28 NOTE — Assessment & Plan Note (Signed)
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 AX L&D 03/11/2019: 3/14 lymph nodes positive with focal extranodal involvementypT3ypN2a  Treatment plan: 1.Neoadjuvant chemotherapy with dose dense Adriamycin and Cytoxan every 2 weeks x4 followed by Taxol weekly x3discontinued due to severe peripheral neuropathycompleted 01/20/2019 2.Followed by [facility] 02/22/2019 3.Followed by radiation7/05/2019-06/02/2019 4.Followed by antiestrogen therapy.06/02/2019 ----------------------------------------------------------------------------------------------------------------------------------------------------- Current treatment: Anastrozole started 06/02/2019 Anastrozole toxicities:Denies any adverse effects to anastrozole.  Lung nodules: Repeat CT scan 12/19/2019: Stable subcentimeter lung nodules bilaterally suggesting benign etiology CT 06/27/2020: Bilateral lower lobe pulmonary nodules.  Overall impression is stable nodules (some mild increase from 3 to 5 mm and 4 to 5 mm and 5 to 7 mm We will continue to do surveillance scans and follow-ups  Breast cancer surveillance: Mammogram 10/12/2019: Benign breast density category B Bone density 10/25/2019: T score -0.9: Normal Liver MRI 10/13/2019: No findings of metastatic disease. Indeterminate right hepatic lobe lesion favored to be a cyst  Return to clinic in 6  months for follow-up after CT chest

## 2020-07-03 ENCOUNTER — Encounter: Payer: Self-pay | Admitting: *Deleted

## 2020-07-04 IMAGING — MR MR ABDOMEN WO/W CM
17 of 18 series · 47 of 48 positions shown · IV contrast (gadavist)
Comparison: CT of 10/11/2019

CLINICAL DATA: Metastatic breast cancer. Right hepatic lesion on
CT.

EXAM:
MRI ABDOMEN WITHOUT AND WITH CONTRAST
TECHNIQUE: Multiplanar multisequence MR imaging of the abdomen was performed
both before and after the administration of intravenous contrast.
CONTRAST:  10mL GADAVIST GADOBUTROL 1 MMOL/ML IV SOLN

[Series 4: cor haste · coronal · 6.0mm · 1.50mm/px · 1 of 42 slices shown]
[im 1/42]
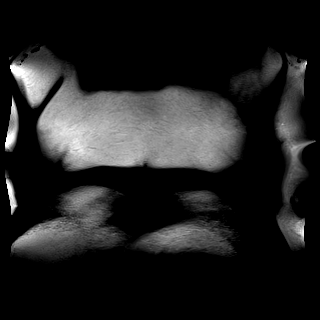

[Series 5: in phase (optional) · axial · 6.0mm · 0.94mm/px · 1 of 42 slices shown]
[im 1/42]
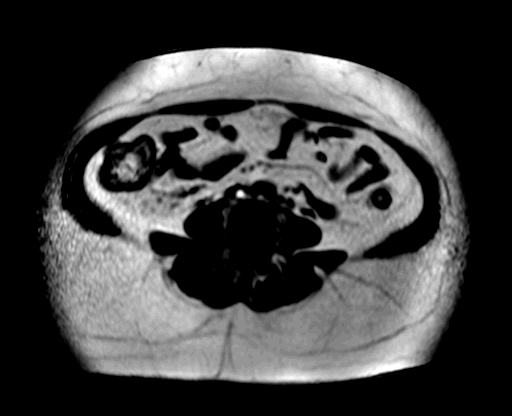

[Series 6: T1 · axial · 6.0mm · 0.94mm/px · 1 of 42 slices shown]
[im 1/42]
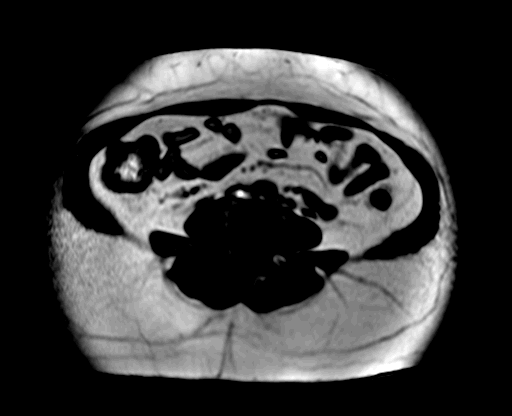

[Series 7: T2 fat-sat · axial · 6.0mm · 1.19mm/px · 1 of 30 slices shown (1 of 2)]
[im 1/30]
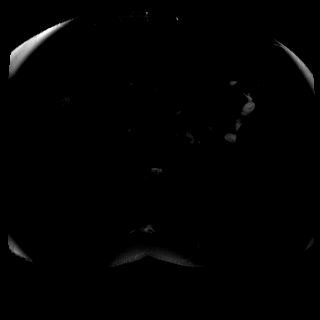

[Series 8: t1_vibe_opp-in_tra_p4_bh · axial · 3.0mm · 1.50mm/px · z∈[-115,+146]mm · 3 of 88 slices shown (1 of 2)]
[im 1/88]
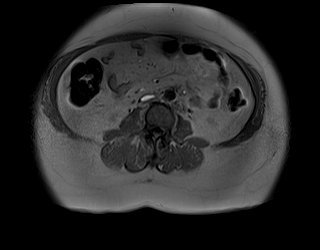
[im 44/88]
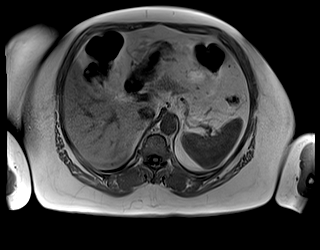
[im 88/88]
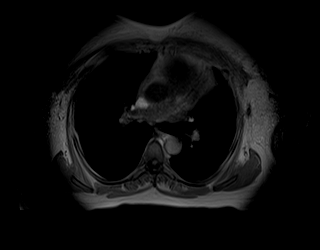

[Series 8: t1_vibe_opp-in_tra_p4_bh · axial · 3.0mm · 1.50mm/px · z∈[-115,+146]mm · 3 of 88 slices shown (2 of 2)]
[im 1/88]
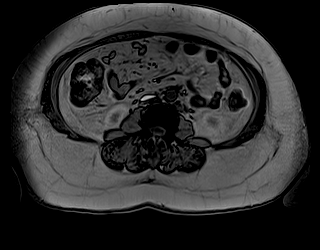
[im 44/88]
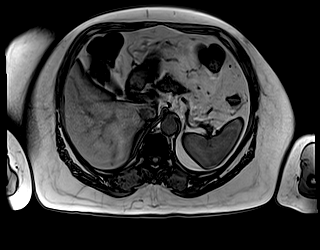
[im 88/88]
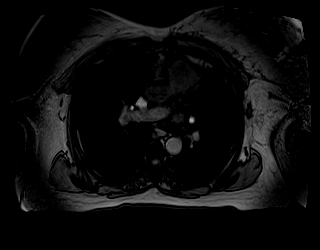

[Series 9: DWI · axial · 6.0mm · 1.79mm/px · z∈[-142,+153]mm · 5 of 126 slices shown (1 of 2)]
[im 1/126]
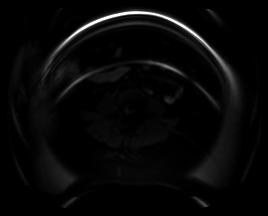
[im 32/126]
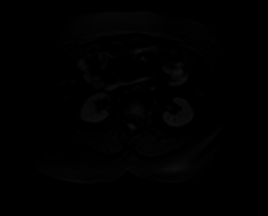
[im 63/126]
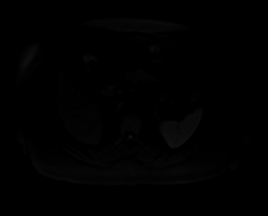
[im 94/126]
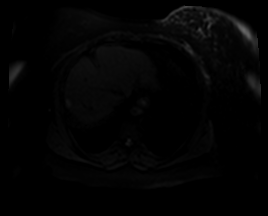
[im 126/126]
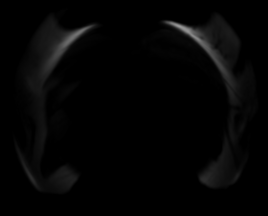

[Series 10: DWI · axial · 6.0mm · 1.79mm/px · z∈[-142,+153]mm · 2 of 42 slices shown (2 of 2)]
[im 1/42]
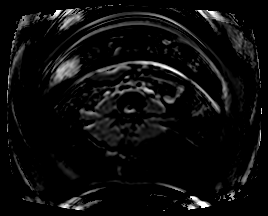
[im 42/42]
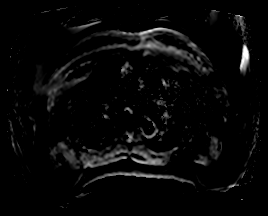

[Series 11: bSSFP · axial · 6.0mm · 0.94mm/px · z∈[-148,+148]mm · 2 of 42 slices shown]
[im 1/42]
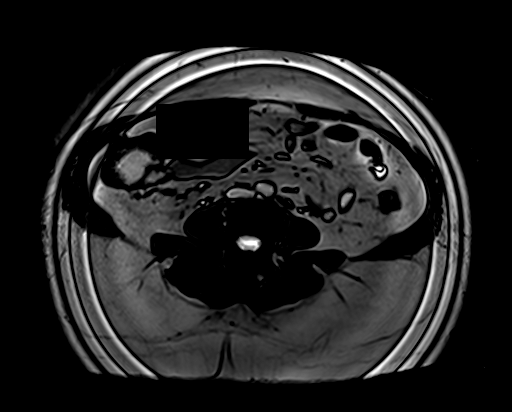
[im 42/42]
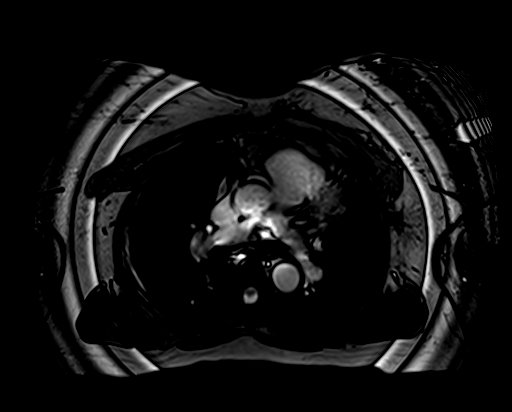

[Series 12: t1_vibe_fs_tra_p4_bh_pre · axial · 3.0mm · 1.50mm/px · z∈[-116,+146]mm · 3 of 88 slices shown (1 of 2)]
[im 1/88]
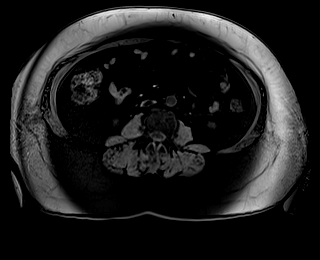
[im 44/88]
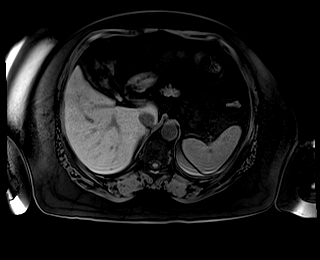
[im 88/88]
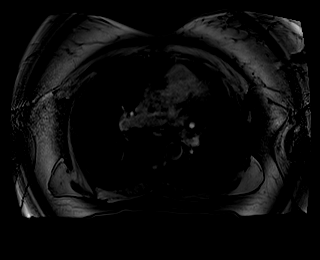

[Series 13: T2 fat-sat · axial · 6.0mm · 1.50mm/px · z∈[-133,+162]mm · 2 of 42 slices shown (2 of 2)]
[im 1/42]
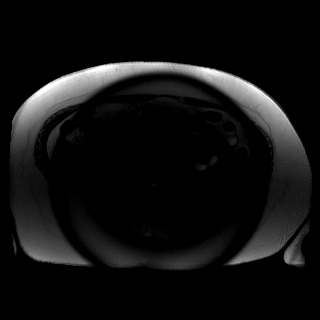
[im 42/42]
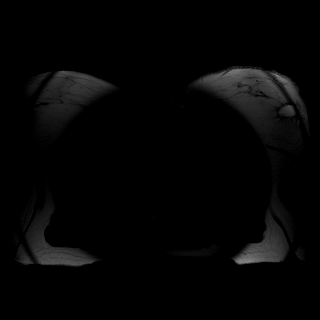

[Series 14: t1_vibe_fs_tra_p4_bh_pre · axial · 3.0mm · 1.50mm/px · z∈[-110,+175]mm · 4 of 96 slices shown (2 of 2)]
[im 1/96]
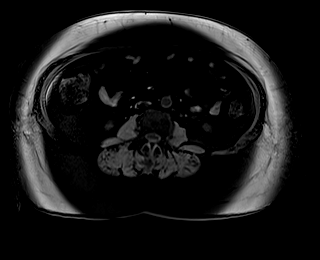
[im 32/96]
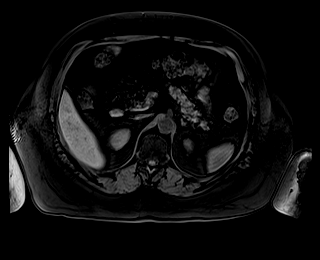
[im 64/96]
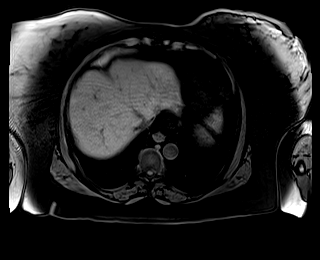
[im 96/96]
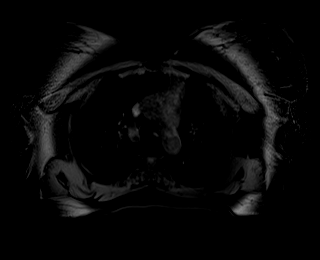

[Series 16: t1_vibe_fs_tra_p4_bh_post · axial · 3.0mm · 1.50mm/px · z∈[-110,+175]mm · 4 of 96 slices shown (1 of 4)]
[im 1/96]
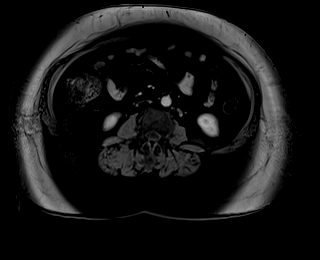
[im 32/96]
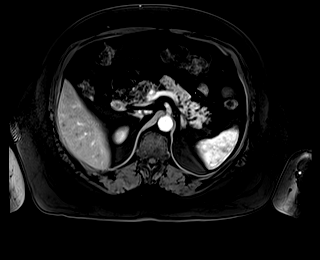
[im 64/96]
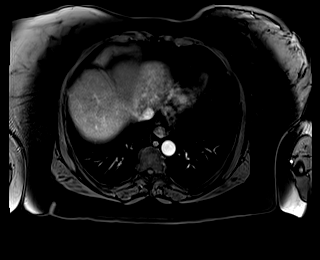
[im 96/96]
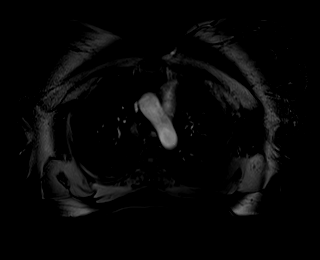

[Series 17: t1_vibe_fs_tra_p4_bh_post · axial · 3.0mm · 1.50mm/px · z∈[-110,+175]mm · 4 of 96 slices shown (2 of 4)]
[im 1/96]
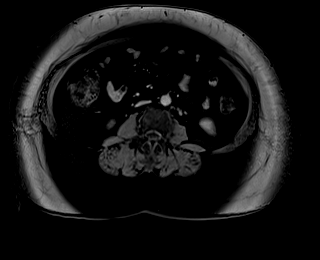
[im 32/96]
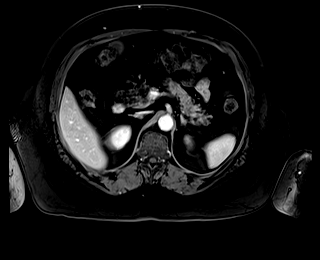
[im 64/96]
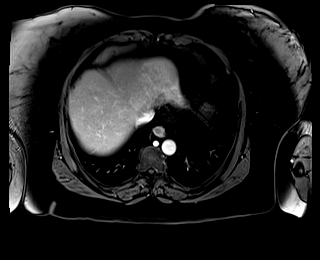
[im 96/96]
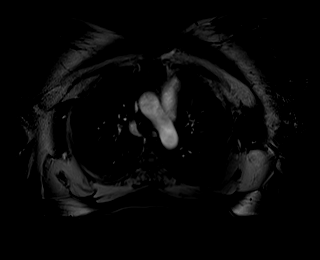

[Series 18: t1_vibe_fs_tra_p4_bh_post · axial · 3.0mm · 1.50mm/px · z∈[-110,+175]mm · 4 of 96 slices shown (3 of 4)]
[im 1/96]
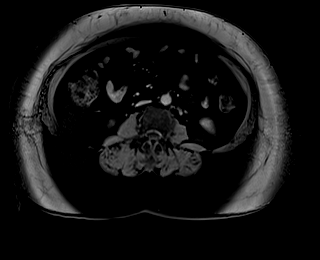
[im 32/96]
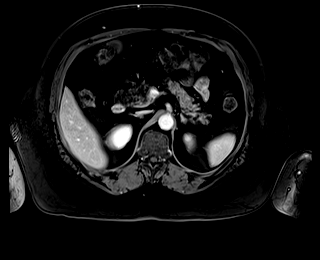
[im 64/96]
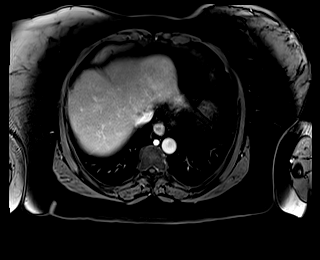
[im 96/96]
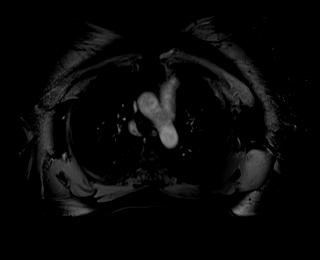

[Series 19: t1_vibe_fs_tra_p4_bh_post · axial · 3.0mm · 1.50mm/px · z∈[-110,+175]mm · 4 of 96 slices shown (4 of 4)]
[im 1/96]
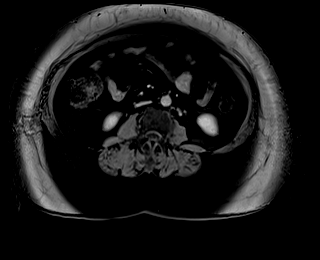
[im 32/96]
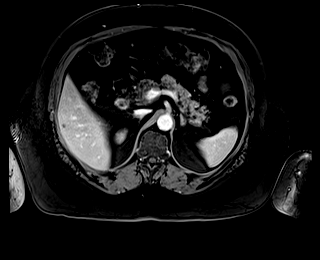
[im 64/96]
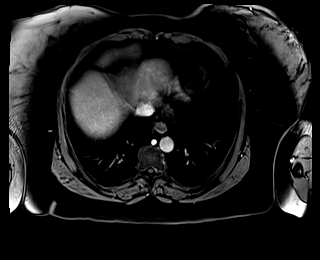
[im 96/96]
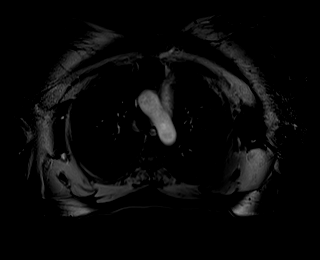

[Series 20: T1 dynamic post-contrast · coronal · 3.0mm · 1.50mm/px · 3 of 88 slices shown]
[im 1/88]
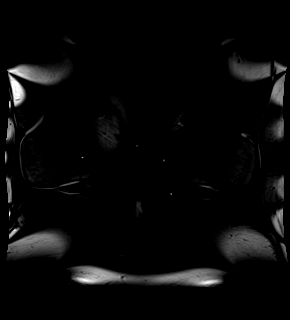
[im 44/88]
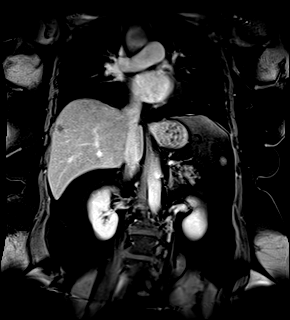
[im 88/88]
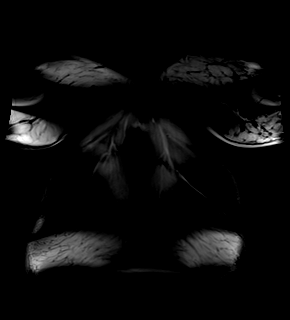

[47 of 48 positions shown; findings below may reference images not displayed]

FINDINGS: Lower chest: Pulmonary nodules, as detailed on chest CT. Normal
heart size without pericardial or pleural effusion.

Hepatobiliary: Bilateral hepatic cysts. 4 mm right hepatic lobe
lesion, including on [DATE], has imaging characteristics consistent
with a cyst. No suspicious liver lesion. Normal gallbladder, without
biliary ductal dilatation.

Pancreas: Fatty replacement involving the pancreatic neck and head.
No duct dilatation.

Spleen:  Normal in size, without focal abnormality.

Adrenals/Urinary Tract: Normal adrenal glands. Normal kidneys,
without hydronephrosis.

Stomach/Bowel: Tiny hiatal hernia.  Normal abdominal bowel loops.

Vascular/Lymphatic: Aortic atherosclerosis. No abdominal adenopathy.

Other:  No ascites.

Musculoskeletal: Mild convex left lumbar spine curvature.
IMPRESSION: 1. No findings of abdominal metastatic disease. The indeterminate
right hepatic lobe lesion on CT is diminutive, but strongly favored
to represent a cyst.
2.  Tiny hiatal hernia.
3.  Aortic Atherosclerosis ([id number].I).
4. Pulmonary nodules, as on CT.

## 2020-07-27 DIAGNOSIS — E785 Hyperlipidemia, unspecified: Secondary | ICD-10-CM | POA: Insufficient documentation

## 2020-08-08 ENCOUNTER — Encounter: Payer: Self-pay | Admitting: Hematology and Oncology

## 2020-09-24 ENCOUNTER — Other Ambulatory Visit: Payer: Self-pay | Admitting: Hematology and Oncology

## 2020-09-27 ENCOUNTER — Telehealth: Payer: Self-pay | Admitting: Hematology and Oncology

## 2020-09-27 ENCOUNTER — Encounter (HOSPITAL_COMMUNITY): Payer: Self-pay

## 2020-09-27 ENCOUNTER — Other Ambulatory Visit: Payer: Self-pay

## 2020-09-27 ENCOUNTER — Inpatient Hospital Stay: Payer: BC Managed Care – PPO | Attending: Hematology

## 2020-09-27 ENCOUNTER — Ambulatory Visit (HOSPITAL_COMMUNITY)
Admission: RE | Admit: 2020-09-27 | Discharge: 2020-09-27 | Disposition: A | Discharge status: Home / Self Care | Payer: BC Managed Care – PPO | Source: Ambulatory Visit | Attending: Hematology and Oncology | Admitting: Hematology and Oncology

## 2020-09-27 DIAGNOSIS — C50412 Malignant neoplasm of upper-outer quadrant of left female breast: Secondary | ICD-10-CM | POA: Insufficient documentation

## 2020-09-27 DIAGNOSIS — Z170 Estrogen receptor positive status [ER+]: Secondary | ICD-10-CM | POA: Insufficient documentation

## 2020-09-27 DIAGNOSIS — R918 Other nonspecific abnormal finding of lung field: Secondary | ICD-10-CM

## 2020-09-27 DIAGNOSIS — C773 Secondary and unspecified malignant neoplasm of axilla and upper limb lymph nodes: Secondary | ICD-10-CM | POA: Insufficient documentation

## 2020-09-27 LAB — CMP (CANCER CENTER ONLY)
ALT: 16 U/L (ref 0–44)
AST: 17 U/L (ref 15–41)
Albumin: 3.6 g/dL (ref 3.5–5.0)
Alkaline Phosphatase: 90 U/L (ref 38–126)
Anion gap: 6 (ref 5–15)
BUN: 15 mg/dL (ref 6–20)
CO2: 29 mmol/L (ref 22–32)
Calcium: 9.8 mg/dL (ref 8.9–10.3)
Chloride: 106 mmol/L (ref 98–111)
Creatinine: 1.02 mg/dL — ABNORMAL HIGH (ref 0.44–1.00)
GFR, Estimated: >60 mL/min (ref >60)
Glucose, Bld: 99 mg/dL (ref 70–99)
Potassium: 3.8 mmol/L (ref 3.5–5.1)
Sodium: 141 mmol/L (ref 135–145)
Total Bilirubin: 0.5 mg/dL (ref 0.3–1.2)
Total Protein: 7.2 g/dL (ref 6.5–8.1)

## 2020-09-27 LAB — CBC WITH DIFFERENTIAL (CANCER CENTER ONLY)
Abs Immature Granulocytes: 0.01 10*3/uL (ref 0.00–0.07)
Basophils Absolute: 0 10*3/uL (ref 0.0–0.1)
Basophils Relative: 1 %
Eosinophils Absolute: 0.1 10*3/uL (ref 0.0–0.5)
Eosinophils Relative: 2 %
HCT: 38.3 % (ref 36.0–46.0)
Hemoglobin: 12.1 g/dL (ref 12.0–15.0)
Immature Granulocytes: 0 %
Lymphocytes Relative: 26 %
Lymphs Abs: 1.1 10*3/uL (ref 0.7–4.0)
MCH: 27.4 pg (ref 26.0–34.0)
MCHC: 31.6 g/dL (ref 30.0–36.0)
MCV: 86.7 fL (ref 80.0–100.0)
Monocytes Absolute: 0.3 10*3/uL (ref 0.1–1.0)
Monocytes Relative: 7 %
Neutro Abs: 2.7 10*3/uL (ref 1.7–7.7)
Neutrophils Relative %: 64 %
Platelet Count: 160 10*3/uL (ref 150–400)
RBC: 4.42 MIL/uL (ref 3.87–5.11)
RDW: 12.2 % (ref 11.5–15.5)
WBC Count: 4.2 10*3/uL (ref 4.0–10.5)
nRBC: 0 % (ref 0.0–0.2)

## 2020-09-27 MED ORDER — IOHEXOL 300 MG/ML  SOLN
75.0000 mL | Freq: Once | INTRAMUSCULAR | Status: AC | PRN
  Administered 2020-09-27: 09:00:00 75 mL via INTRAVENOUS

## 2020-09-27 NOTE — Telephone Encounter (Signed)
I informed her that the CT scans showed that the lung nodules are stable and no other evidence of metastatic disease. We will plan for another CT scan in 4 months and follow-up after that.

## 2020-12-27 ENCOUNTER — Other Ambulatory Visit: Payer: Self-pay | Admitting: Hematology and Oncology

## 2020-12-27 DIAGNOSIS — I700 Atherosclerosis of aorta: Secondary | ICD-10-CM

## 2021-01-10 ENCOUNTER — Other Ambulatory Visit: Payer: Self-pay

## 2021-01-10 ENCOUNTER — Ambulatory Visit: Payer: BC Managed Care – PPO

## 2021-01-10 ENCOUNTER — Ambulatory Visit: Payer: BC Managed Care – PPO | Admitting: Podiatry

## 2021-01-10 DIAGNOSIS — I872 Venous insufficiency (chronic) (peripheral): Secondary | ICD-10-CM

## 2021-01-10 DIAGNOSIS — M778 Other enthesopathies, not elsewhere classified: Secondary | ICD-10-CM

## 2021-01-10 DIAGNOSIS — M792 Neuralgia and neuritis, unspecified: Secondary | ICD-10-CM | POA: Diagnosis not present

## 2021-01-10 NOTE — Progress Notes (Signed)
She presents today for follow-up of her bilateral foot pain.  States that she knows she is to have swelling more the left than that of the right.  Denies any further trauma.  Objective: Vital signs are stable she is alert and oriented x3.  Pulses are palpable.  Neurologic sensorium is intact deep tendon reflexes are intact and strength normal symmetrical.  Edema to the left lower extremity pitting in nature to the shin.  Is only grade 1 or grade 2 edema.  Assessment: Edema bilateral lower extremity left greater than right.  Plan: This point we will send for venous insufficiency studies.  Follow-up with her once those come in.

## 2021-01-29 ENCOUNTER — Other Ambulatory Visit: Payer: Self-pay | Admitting: Hematology and Oncology

## 2021-01-30 ENCOUNTER — Other Ambulatory Visit: Payer: Self-pay | Admitting: *Deleted

## 2021-01-30 ENCOUNTER — Other Ambulatory Visit: Payer: Self-pay

## 2021-01-30 ENCOUNTER — Telehealth: Payer: Self-pay | Admitting: *Deleted

## 2021-01-30 ENCOUNTER — Inpatient Hospital Stay: Payer: BC Managed Care – PPO | Attending: Hematology and Oncology

## 2021-01-30 DIAGNOSIS — Z170 Estrogen receptor positive status [ER+]: Secondary | ICD-10-CM

## 2021-01-30 DIAGNOSIS — C50412 Malignant neoplasm of upper-outer quadrant of left female breast: Secondary | ICD-10-CM | POA: Insufficient documentation

## 2021-01-30 LAB — CMP (CANCER CENTER ONLY)
ALT: 15 U/L (ref 0–44)
AST: 16 U/L (ref 15–41)
Albumin: 3.6 g/dL (ref 3.5–5.0)
Alkaline Phosphatase: 85 U/L (ref 38–126)
Anion gap: 8 (ref 5–15)
BUN: 16 mg/dL (ref 6–20)
CO2: 30 mmol/L (ref 22–32)
Calcium: 9.4 mg/dL (ref 8.9–10.3)
Chloride: 105 mmol/L (ref 98–111)
Creatinine: 0.98 mg/dL (ref 0.44–1.00)
GFR, Estimated: >60 mL/min (ref >60)
Glucose, Bld: 101 mg/dL — ABNORMAL HIGH (ref 70–99)
Potassium: 3.9 mmol/L (ref 3.5–5.1)
Sodium: 143 mmol/L (ref 135–145)
Total Bilirubin: 0.4 mg/dL (ref 0.3–1.2)
Total Protein: 6.9 g/dL (ref 6.5–8.1)

## 2021-01-30 LAB — CBC WITH DIFFERENTIAL (CANCER CENTER ONLY)
Abs Immature Granulocytes: 0 10*3/uL (ref 0.00–0.07)
Basophils Absolute: 0 10*3/uL (ref 0.0–0.1)
Basophils Relative: 1 %
Eosinophils Absolute: 0.1 10*3/uL (ref 0.0–0.5)
Eosinophils Relative: 2 %
HCT: 37.7 % (ref 36.0–46.0)
Hemoglobin: 12 g/dL (ref 12.0–15.0)
Immature Granulocytes: 0 %
Lymphocytes Relative: 28 %
Lymphs Abs: 1.1 10*3/uL (ref 0.7–4.0)
MCH: 27.8 pg (ref 26.0–34.0)
MCHC: 31.8 g/dL (ref 30.0–36.0)
MCV: 87.5 fL (ref 80.0–100.0)
Monocytes Absolute: 0.4 10*3/uL (ref 0.1–1.0)
Monocytes Relative: 9 %
Neutro Abs: 2.4 10*3/uL (ref 1.7–7.7)
Neutrophils Relative %: 60 %
Platelet Count: 158 10*3/uL (ref 150–400)
RBC: 4.31 MIL/uL (ref 3.87–5.11)
RDW: 12.3 % (ref 11.5–15.5)
WBC Count: 3.9 10*3/uL — ABNORMAL LOW (ref 4.0–10.5)
nRBC: 0 % (ref 0.0–0.2)

## 2021-01-30 NOTE — Telephone Encounter (Signed)
Scheduled and confirmed appt with Dr. Lindi Adie on 5/2 at 2:30pm to discuss CT scan results.

## 2021-01-31 ENCOUNTER — Ambulatory Visit (HOSPITAL_COMMUNITY)
Admission: RE | Admit: 2021-01-31 | Discharge: 2021-01-31 | Disposition: A | Discharge status: Home / Self Care | Payer: BC Managed Care – PPO | Source: Ambulatory Visit | Attending: Hematology and Oncology | Admitting: Hematology and Oncology

## 2021-01-31 ENCOUNTER — Encounter (HOSPITAL_COMMUNITY): Payer: Self-pay

## 2021-01-31 DIAGNOSIS — R918 Other nonspecific abnormal finding of lung field: Secondary | ICD-10-CM | POA: Diagnosis present

## 2021-01-31 MED ORDER — IOHEXOL 300 MG/ML  SOLN
75.0000 mL | Freq: Once | INTRAMUSCULAR | Status: AC | PRN
  Administered 2021-01-31: 75 mL via INTRAVENOUS

## 2021-02-10 NOTE — Assessment & Plan Note (Signed)
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 AX L&D 03/11/2019: 3/14 lymph nodes positive with focal extranodal involvementypT3ypN2a  Treatment plan: 1.Neoadjuvant chemotherapy with dose dense Adriamycin and Cytoxan every 2 weeks x4 followed by Taxol weekly x3discontinued due to severe peripheral neuropathycompleted 01/20/2019 2.Follow[facility] by [facility] 02/22/2019 3.Followed by radiation7/05/2019-06/02/2019 4.Followed by antiestrogen therapy.06/02/2019 ----------------------------------------------------------------------------------------------------------------------------------------------------- Current treatment: Anastrozole started 06/02/2019 Anastrozole toxicities:Hot flashes (night sweats)  Lung nodules: Repeat CT scan 12/19/2019: Stable subcentimeter lung nodules bilaterally suggesting benign etiology CT 06/27/2020: Bilateral lower lobe pulmonary nodules.  Overall impression is stable nodules (some mild increase from 3 to 5 mm and 4 to 5 mm and 5 to 7 mm   Breast cancer surveillance: Mammogram 10/12/2019: Benign breast density category B Bone density 10/25/2019: T score -0.9: Normal Liver MRI 10/13/2019: No findings of metastatic disease. Indeterminate right hepatic lobe lesion favored to be a cyst  01/31/21: CT chest: Stable tiny lung nodules Pulm HTN.  Return to clinic

## 2021-02-10 NOTE — Progress Notes (Signed)
Patient Care Team: Marva Panda, NP as PCP - General Harriette Bouillon, MD as Consulting Physician (General Surgery) Serena Croissant, MD as Consulting Physician (Hematology and Oncology) Dorothy Puffer, MD as Consulting Physician (Radiation Oncology)  DIAGNOSIS:    ICD-10-CM   1. Malignant neoplasm of upper-outer quadrant of left breast in female, estrogen receptor positive (HCC)  C50.412    Z17.0     SUMMARY OF ONCOLOGIC HISTORY: Oncology History  Malignant neoplasm of upper-outer quadrant of left breast in female, estrogen receptor positive (HCC)  10/22/2018 Initial Diagnosis   Screening detected left breast architectural distortion: 4.5 cm, UOQ, by ultrasound measured 5 cm at 1 o'clock position multiple enlarged lymph nodes, biopsy revealed grade 1 IDC with DCIS with lymphovascular invasion, lymph node biopsy positive, intramammary lymph node biopsy negative, ER 100%, PR 100%, Ki-67 20%, HER-2 1+ by IHC, T2 and 1 stage 2A   10/27/2018 Cancer Staging   Staging form: Breast, AJCC 8th Edition - Clinical: Stage IIA (cT2, cN1(f), cM0, G1, ER+, PR+, HER2-) - Signed by Serena Croissant, MD on 11/01/2018   11/11/2018 - 12/20/2018 Neo-Adjuvant Chemotherapy   Neoadjuvant chemotherapy with dose dense Adriamycin and Cytoxan every 2 weeks x4 followed by Taxol weekly x12 (stopped after cycle 3 due to severe neuropathy)   02/01/2019 Breast MRI   Known malignancy in left breast measures 4.7 x 3.5 x 7.2cm, previously 5 x 3 x 10cm.    02/22/2019 Surgery   Left lumpectomy ([facility]): IDC with DCIS, 5.8cm, HER2 negative, ER 90%, PR 90%, Ki67 2%, clear margins,3/5 LN positive for malignancy.    03/02/2019 Cancer Staging   Staging form: Breast, AJCC 8th Edition - Pathologic stage from 03/02/2019: No Stage Recommended (ypT3, pN1a, cM0, G2, ER+, PR+, HER2-) - Signed by Serena Croissant, MD on 03/02/2019   03/11/2019 Surgery   Left axillary lymph node dissection ([facility]): metastatic carcinoma in 3/14 lymph nodes,  focal extranodal involvement by tumor.   04/20/2019 - 06/02/2019 Radiation Therapy   Adjuvant radiation therapy   05/30/2019 -  Anti-estrogen oral therapy   Anastrozole 1 mg daily     CHIEF COMPLIANT: Follow-up of left breast cancer on anastrozole  INTERVAL HISTORY: [name] is a 60 y.o. with above-mentioned history of left breast cancer treated with neoadjuvant chemotherapy, lumpectomy and axillary lymph node dissection, radiation, and who is currently onanti-estrogen therapywith anastrozole.Chest CT on 01/31/21 showed stable bilateral lower lobe pulmonary nodules. She presents to the clinic todayfor follow-up to review her scan.  ALLERGIES:  has No Known Allergies.  MEDICATIONS:  Current Outpatient Medications  Medication Sig Dispense Refill  . amLODipine (NORVASC) 5 MG tablet TAKE 1 TABLET BY MOUTH EVERY DAY 90 tablet 0  . anastrozole (ARIMIDEX) 1 MG tablet Take 1 tablet (1 mg total) by mouth daily. 90 tablet 3  . aspirin EC 81 MG tablet Take 1 tablet (81 mg total) by mouth daily. 90 tablet 3  . atorvastatin (LIPITOR) 10 MG tablet TAKE 1 TABLET BY MOUTH EVERY DAY 90 tablet 3  . carvedilol (COREG) 6.25 MG tablet TAKE 1 TABLET BY MOUTH TWICE DAILY (Patient taking differently: Take 6.25 mg by mouth 2 (two) times daily with a meal. ) 180 tablet 1  . cephALEXin (KEFLEX) 500 MG capsule cephalexin 500 mg capsule    . cyclobenzaprine (FLEXERIL) 5 MG tablet Take 5 mg by mouth 3 (three) times daily as needed.    Marland Kitchen HYDROcodone-homatropine (HYDROMET) 5-1.5 MG/5ML syrup Hydromet 5 mg-1.5 mg/5 mL oral syrup  Take 5 mL  every 4 hours by oral route.    Marland Kitchen lisinopril-hydrochlorothiazide (ZESTORETIC) 20-25 MG tablet Take 1 tablet by mouth daily.    . methylPREDNISolone sodium succinate (SOLU-MEDROL) 125 mg/2 mL injection Solu-Medrol (PF) 125 mg/2 mL solution for injection  Take 125 mg by injection route.    . naproxen (NAPROSYN) 500 MG tablet Take 1 tablet (500 mg total) by mouth 2 (two)  times daily with a meal. 60 tablet 3  . NONFORMULARY OR COMPOUNDED ITEM Creams Peripheral Neuropathy Cream: Bupivacaine 1% Doxepin3% Gabapentin 6% Pentoxifyline 3% Topiramate 1% Sig: Apply 1 to 2 gm to affected areaqs 3-4x daily prn 200gm Refills PRN    . pantoprazole (PROTONIX) 40 MG tablet TAKE 1 TABLET BY MOUTH EVERY DAY 90 tablet 0  . pregabalin (LYRICA) 50 MG capsule Take 1 capsule (50 mg total) by mouth 3 (three) times daily. 90 capsule 6  . triamcinolone ointment (KENALOG) 0.1 % triamcinolone acetonide 0.1 % topical ointment     No current facility-administered medications for this visit.   Facility-Administered Medications Ordered in Other Visits  Medication Dose Route Frequency Provider Last Rate Last Admin  . gadopentetate dimeglumine (MAGNEVIST) injection 20 mL  20 mL Intravenous Once PRN Sarina Ill B, MD      . gadopentetate dimeglumine (MAGNEVIST) injection 20 mL  20 mL Intravenous Once PRN Melvenia Beam, MD        PHYSICAL EXAMINATION: ECOG PERFORMANCE STATUS: 1 - Symptomatic but completely ambulatory  Vitals:   02/11/21 1446  BP: 116/72  Pulse: 100  Resp: 18  Temp: 97.7 F (36.5 C)  SpO2: 100%   Filed Weights   02/11/21 1446  Weight: (!) 300 lb 3.2 oz (136.2 kg)    LABORATORY DATA:  I have reviewed the data as listed CMP Latest Ref Rng & Units 01/30/2021 09/27/2020 06/26/2020  Glucose 70 - 99 mg/dL 101(H) 99 89  BUN 6 - 20 mg/dL $Remove'16 15 20  'ybHvfYi$ Creatinine 0.44 - 1.00 mg/dL 0.98 1.02(H) 1.07(H)  Sodium 135 - 145 mmol/L 143 141 139  Potassium 3.5 - 5.1 mmol/L 3.9 3.8 3.5  Chloride 98 - 111 mmol/L 105 106 104  CO2 22 - 32 mmol/L $RemoveB'30 29 29  'ipglnjFl$ Calcium 8.9 - 10.3 mg/dL 9.4 9.8 9.4  Total Protein 6.5 - 8.1 g/dL 6.9 7.2 7.0  Total Bilirubin 0.3 - 1.2 mg/dL 0.4 0.5 0.4  Alkaline Phos 38 - 126 U/L 85 90 81  AST 15 - 41 U/L $Remo'16 17 17  'fsXQp$ ALT 0 - 44 U/L $Remo'15 16 14    'arvmk$ Lab Results  Component Value Date   WBC 3.9 (L) 01/30/2021   HGB 12.0 01/30/2021   HCT 37.7 01/30/2021    MCV 87.5 01/30/2021   PLT 158 01/30/2021   NEUTROABS 2.4 01/30/2021    ASSESSMENT & PLAN:  Malignant neoplasm of upper-outer quadrant of left breast in female, estrogen receptor positive ([place]) 10/22/2018:Screening detected left breast architectural distortion: 4.5 cm, UOQ, by ultrasound measured 5 cm at 1 o'clock position multiple enlarged lymph nodes, biopsy revealed grade 1 IDC with DCIS with lymphovascular invasion, lymph node biopsy positive, intramammary lymph node biopsy negative, ER 100%, PR 100%, Ki-67 20%, HER-2 1+ by IHC, T2 and 1 stage Ib 11/01/2018: MRI biopsy left breast IDC grade 1, ER PR positive HER-2 negative  Left lumpectomy: 02/22/2019:([facility]): IDC with DCIS, 5.8cm,margins negative, lymphovascular invasion presentHER2 negative, ER 90%, PR 90%, Ki67 2%, clear margins,3/5 LN positive for malignancy. AX L&D 03/11/2019: 3/14 lymph nodes positive with focal extranodal  involvementypT3ypN2a  Treatment plan: 1.Neoadjuvant chemotherapy with dose dense Adriamycin and Cytoxan every 2 weeks x4 followed by Taxol weekly x3discontinued due to severe peripheral neuropathycompleted 01/20/2019 2.Followed by [facility] 02/22/2019 3.Followed by radiation7/05/2019-06/02/2019 4.Followed by antiestrogen therapy.06/02/2019 ----------------------------------------------------------------------------------------------------------------------------------------------------- Current treatment: Anastrozole started 06/02/2019 Anastrozole toxicities:Hot flashes (night sweats)  Lung nodules: Repeat CT scan 12/19/2019: Stable subcentimeter lung nodules bilaterally suggesting benign etiology CT 06/27/2020: Bilateral lower lobe pulmonary nodules.  Overall impression is stable nodules (some mild increase from 3 to 5 mm and 4 to 5 mm and 5 to 7 mm 01/31/21: CT chest: Stable tiny lung nodules Pulm HTN. Plan to do additional scans in 1 year.   Breast cancer surveillance: Mammogram 10/12/2019:  Benign breast density category B Bone density 10/25/2019: T score -0.9: Normal Liver MRI 10/13/2019: No findings of metastatic disease. 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  Return to clinic in December 2022 and then we can see her once a year.    No orders of the defined types were placed in this encounter.  The patient has a good understanding of the overall plan. she agrees with it. she will call with any problems that may develop before the next visit here.  Total time spent: 20 mins including face to face time and time spent for planning, charting and coordination of care  Rulon Eisenmenger, MD, MPH 02/11/2021  I, Cloyde Reams Dorshimer, am acting as scribe for Dr. Nicholas Lose.  I have reviewed the above documentation for accuracy and completeness, and I agree with the above.

## 2021-02-11 ENCOUNTER — Inpatient Hospital Stay: Payer: BC Managed Care – PPO | Attending: Hematology and Oncology | Admitting: Hematology and Oncology

## 2021-02-11 ENCOUNTER — Other Ambulatory Visit: Payer: Self-pay

## 2021-02-11 DIAGNOSIS — C773 Secondary and unspecified malignant neoplasm of axilla and upper limb lymph nodes: Secondary | ICD-10-CM | POA: Insufficient documentation

## 2021-02-11 DIAGNOSIS — C50412 Malignant neoplasm of upper-outer quadrant of left female breast: Secondary | ICD-10-CM | POA: Diagnosis not present

## 2021-02-11 DIAGNOSIS — R918 Other nonspecific abnormal finding of lung field: Secondary | ICD-10-CM | POA: Diagnosis not present

## 2021-02-11 DIAGNOSIS — I272 Pulmonary hypertension, unspecified: Secondary | ICD-10-CM | POA: Diagnosis not present

## 2021-02-11 DIAGNOSIS — Z170 Estrogen receptor positive status [ER+]: Secondary | ICD-10-CM | POA: Diagnosis not present

## 2021-02-11 MED ORDER — ANASTROZOLE 1 MG PO TABS: 1.0000 mg | ORAL_TABLET | Freq: Every day | ORAL | 3 refills | Status: DC

## 2021-02-12 ENCOUNTER — Ambulatory Visit: Payer: BC Managed Care – PPO | Admitting: Podiatry

## 2021-02-14 ENCOUNTER — Encounter (HOSPITAL_COMMUNITY): Payer: BC Managed Care – PPO

## 2021-02-14 ENCOUNTER — Other Ambulatory Visit (HOSPITAL_COMMUNITY): Payer: Self-pay | Admitting: Podiatry

## 2021-02-14 ENCOUNTER — Inpatient Hospital Stay (HOSPITAL_COMMUNITY): Admission: RE | Admit: 2021-02-14 | Payer: BC Managed Care – PPO | Source: Ambulatory Visit

## 2021-02-14 DIAGNOSIS — M79606 Pain in leg, unspecified: Secondary | ICD-10-CM

## 2021-02-22 DIAGNOSIS — C801 Malignant (primary) neoplasm, unspecified: Secondary | ICD-10-CM | POA: Insufficient documentation

## 2021-03-08 ENCOUNTER — Other Ambulatory Visit: Payer: Self-pay | Admitting: Hematology and Oncology

## 2021-05-07 ENCOUNTER — Other Ambulatory Visit: Payer: Self-pay | Admitting: Hematology and Oncology

## 2021-05-10 ENCOUNTER — Other Ambulatory Visit: Payer: Self-pay | Admitting: Hematology and Oncology

## 2021-05-12 ENCOUNTER — Other Ambulatory Visit: Payer: Self-pay | Admitting: Hematology and Oncology

## 2021-05-13 ENCOUNTER — Other Ambulatory Visit: Payer: Self-pay | Admitting: *Deleted

## 2021-05-13 MED ORDER — PREGABALIN 50 MG PO CAPS: ORAL_CAPSULE | ORAL | 0 refills | Status: DC

## 2021-07-22 ENCOUNTER — Encounter: Payer: Self-pay | Admitting: Hematology and Oncology

## 2021-07-23 ENCOUNTER — Other Ambulatory Visit: Payer: Self-pay | Admitting: Hematology and Oncology

## 2021-07-23 NOTE — Telephone Encounter (Signed)
Lyrica is now a controlled substance and I can not refill.  Thanks!

## 2021-08-05 ENCOUNTER — Other Ambulatory Visit: Payer: Self-pay | Admitting: Hematology and Oncology

## 2021-08-30 ENCOUNTER — Telehealth: Payer: Self-pay | Admitting: *Deleted

## 2021-08-30 NOTE — Telephone Encounter (Signed)
Pt called requesting an order for CT of chest that Dr.Gudena does every year. Message was sent to [place] nurse to further evaluate and request for order to be placed by Oncologist.

## 2021-09-02 ENCOUNTER — Telehealth: Payer: Self-pay | Admitting: *Deleted

## 2021-09-02 ENCOUNTER — Other Ambulatory Visit: Payer: Self-pay | Admitting: *Deleted

## 2021-09-02 DIAGNOSIS — R918 Other nonspecific abnormal finding of lung field: Secondary | ICD-10-CM

## 2021-09-02 DIAGNOSIS — C50412 Malignant neoplasm of upper-outer quadrant of left female breast: Secondary | ICD-10-CM

## 2021-09-02 DIAGNOSIS — Z170 Estrogen receptor positive status [ER+]: Secondary | ICD-10-CM

## 2021-09-02 NOTE — Progress Notes (Signed)
Per MD request RN placed orders for CT chest with contrast to evaluate pulmonary nodules.  RN will schedule scan once PA is obtained.

## 2021-09-02 NOTE — Telephone Encounter (Signed)
error 

## 2021-09-06 ENCOUNTER — Other Ambulatory Visit: Payer: Self-pay

## 2021-09-06 ENCOUNTER — Ambulatory Visit (HOSPITAL_COMMUNITY)
Admission: RE | Admit: 2021-09-06 | Discharge: 2021-09-06 | Disposition: A | Discharge status: Home / Self Care | Payer: BC Managed Care – PPO | Source: Ambulatory Visit | Attending: Hematology and Oncology | Admitting: Hematology and Oncology

## 2021-09-06 ENCOUNTER — Encounter (HOSPITAL_COMMUNITY): Payer: Self-pay

## 2021-09-06 DIAGNOSIS — R918 Other nonspecific abnormal finding of lung field: Secondary | ICD-10-CM | POA: Insufficient documentation

## 2021-09-06 DIAGNOSIS — Z170 Estrogen receptor positive status [ER+]: Secondary | ICD-10-CM | POA: Diagnosis present

## 2021-09-06 DIAGNOSIS — C50412 Malignant neoplasm of upper-outer quadrant of left female breast: Secondary | ICD-10-CM | POA: Diagnosis present

## 2021-09-06 LAB — POCT I-STAT CREATININE: Creatinine, Ser: 1 mg/dL (ref 0.44–1.00)

## 2021-09-06 MED ORDER — SODIUM CHLORIDE (PF) 0.9 % IJ SOLN
INTRAMUSCULAR | Status: AC
  Filled 2021-09-06: qty 50

## 2021-09-06 MED ORDER — IOHEXOL 350 MG/ML SOLN
60.0000 mL | Freq: Once | INTRAVENOUS | Status: AC | PRN
  Administered 2021-09-06: 60 mL via INTRAVENOUS

## 2021-09-09 NOTE — Assessment & Plan Note (Signed)
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 AX L&D 03/11/2019: 3/14 lymph nodes positive with focal extranodal involvementypT3ypN2a  Treatment plan: 1.Neoadjuvant chemotherapy with dose dense Adriamycin and Cytoxan every 2 weeks x4 followed by Taxol weekly x3discontinued due to severe peripheral neuropathycompleted 01/20/2019 2.Followed by [facility] 02/22/2019 3.Followed by radiation7/05/2019-06/02/2019 4.Followed by antiestrogen therapy.06/02/2019 ----------------------------------------------------------------------------------------------------------------------------------------------------- Current treatment: Anastrozole started 06/02/2019 Anastrozole toxicities:Hot flashes (night sweats)  Lung nodules: Repeat CT scan 12/19/2019: Stable subcentimeter lung nodules bilaterally suggesting benign etiology CT 06/27/2020: Bilateral lower lobe pulmonary nodules. Overall impression is stable nodules (some mild increase from 3 to 5 mm and 4 to 5 mm and 5 to 7 mm 01/31/21: CT chest: Stable tiny lung nodules Pulm HTN. 09/06/21: CT Chest:  Stable lung nodules favored benign   Breast cancer surveillance: Mammogram 07/22/21: Benign breast density category A Bone density 10/25/2019: T score -0.9: Normal Liver MRI 10/13/2019: No findings of metastatic disease. 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  Return to clinic in 1 year.

## 2021-09-09 NOTE — Progress Notes (Signed)
HEMATOLOGY-ONCOLOGY TELEPHONE VISIT PROGRESS NOTE  I connected with [name] on 09/10/2021 at 10:45 AM EST by telephone video conference and verified that I am speaking with the correct person using two identifiers.  I discussed the limitations, risks, security and privacy concerns of performing an evaluation and management service by MyChart and the availability of in person appointments.  I also discussed with the patient that there may be a patient responsible charge related to this service. The patient expressed understanding and agreed to proceed.  Patient's Location: Home Physician Location: Clinic  CHIEF COMPLIANT: Follow-up of left breast cancer on anastrozole  INTERVAL HISTORY: [name] is a 60 y.o. female with above-mentioned history of breast cancer treated with neoadjuvant chemotherapy, lumpectomy and axillary lymph node dissection, radiation, and who is currently on anti-estrogen therapy with anastrozole. Chest CT on 09/06/2021 showed stable bilateral lower lobe pulmonary nodules. She presents via MyChart today for follow-up.  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  She denies any lumps or nodules.  Oncology History  Malignant neoplasm of upper-outer quadrant of left breast in female, estrogen receptor positive ([place])  10/22/2018 Initial Diagnosis   Screening detected left breast architectural distortion: 4.5 cm, UOQ, by ultrasound measured 5 cm at 1 o'clock position multiple enlarged lymph nodes, biopsy revealed grade 1 IDC with DCIS with lymphovascular invasion, lymph node biopsy positive, intramammary lymph node biopsy negative, ER 100%, PR 100%, Ki-67 20%, HER-2 1+ by IHC, T2 and 1 stage 2A   10/27/2018 Cancer Staging   Staging form: Breast, AJCC 8th Edition - Clinical: Stage IIA (cT2, cN1(f), cM0, G1, ER+, PR+, HER2-) - Signed by Nicholas Lose, MD on 11/01/2018    11/11/2018 - 12/20/2018 Neo-Adjuvant Chemotherapy   Neoadjuvant chemotherapy  with dose dense Adriamycin and Cytoxan every 2 weeks x4 followed by Taxol weekly x12 (stopped after cycle 3 due to severe neuropathy)   02/01/2019 Breast MRI   Known malignancy in left breast measures 4.7 x 3.5 x 7.2cm, previously 5 x 3 x 10cm.    02/22/2019 Surgery   Left lumpectomy ([facility]): IDC with DCIS, 5.8cm, HER2 negative, ER 90%, PR 90%, Ki67 2%, clear margins,3/5 LN positive for malignancy.    03/02/2019 Cancer Staging   Staging form: Breast, AJCC 8th Edition - Pathologic stage from 03/02/2019: No Stage Recommended (ypT3, pN1a, cM0, G2, ER+, PR+, HER2-) - Signed by Nicholas Lose, MD on 03/02/2019    03/11/2019 Surgery   Left axillary lymph node dissection ([facility]): metastatic carcinoma in 3/14 lymph nodes, focal extranodal involvement by tumor.   04/20/2019 - 06/02/2019 Radiation Therapy   Adjuvant radiation therapy   05/30/2019 -  Anti-estrogen oral therapy   Anastrozole 1 mg daily     Observations/Objective:  There were no vitals filed for this visit. There is no height or weight on file to calculate BMI.  I have reviewed the data as listed CMP Latest Ref Rng & Units 09/06/2021 01/30/2021 09/27/2020  Glucose 70 - 99 mg/dL - 101(H) 99  BUN 6 - 20 mg/dL - 16 15  Creatinine 0.44 - 1.00 mg/dL 1.00 0.98 1.02(H)  Sodium 135 - 145 mmol/L - 143 141  Potassium 3.5 - 5.1 mmol/L - 3.9 3.8  Chloride 98 - 111 mmol/L - 105 106  CO2 22 - 32 mmol/L - 30 29  Calcium 8.9 - 10.3 mg/dL - 9.4 9.8  Total Protein 6.5 - 8.1 g/dL - 6.9 7.2  Total Bilirubin 0.3 - 1.2 mg/dL - 0.4 0.5  Alkaline Phos  38 - 126 U/L - 85 90  AST 15 - 41 U/L - 16 17  ALT 0 - 44 U/L - 15 16    Lab Results  Component Value Date   WBC 3.9 (L) 01/30/2021   HGB 12.0 01/30/2021   HCT 37.7 01/30/2021   MCV 87.5 01/30/2021   PLT 158 01/30/2021   NEUTROABS 2.4 01/30/2021      Assessment Plan:  Malignant neoplasm of upper-outer quadrant of left breast in female, estrogen receptor positive ([name]) 10/22/2018: Screening  detected left breast architectural distortion: 4.5 cm, UOQ, by ultrasound measured 5 cm at 1 o'clock position multiple enlarged lymph nodes, biopsy revealed 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  AX L&D 03/11/2019: 3/14 lymph nodes positive with focal extranodal involvement [id number]   Treatment plan: 1.  Neoadjuvant chemotherapy with dose dense Adriamycin and Cytoxan every 2 weeks x4 followed by Taxol weekly x 3 discontinued due to severe peripheral neuropathy completed 01/20/2019 2.  Followed by mastectomy on 02/22/2019 3.  Followed by radiation 04/20/2019-06/02/2019 4.  Followed by antiestrogen therapy. 06/02/2019 ----------------------------------------------------------------------------------------------------------------------------------------------------- Current treatment: Anastrozole started 06/02/2019 Anastrozole toxicities: Hot flashes (night sweats)   Lung nodules: Repeat CT scan 12/19/2019: Stable subcentimeter lung nodules bilaterally suggesting benign etiology CT 06/27/2020: Bilateral lower lobe pulmonary nodules.  Overall impression is stable nodules (some mild increase from 3 to 5 mm and 4 to 5 mm and 5 to 7 mm 01/31/21: CT chest: Stable tiny lung nodules Pulm HTN. 09/06/21: CT Chest:  Stable lung nodules favored benign    Breast cancer surveillance: Mammogram 07/22/21: Benign breast density category A Bone density 10/25/2019: T score -0.9: Normal Liver MRI 10/13/2019: No findings of metastatic disease.  Indeterminate right hepatic lobe lesion favored to be a cyst   Work  stresses her out.  Return to clinic in June 2023 and then annually.  She sees Dr. Brantley Stage in December.   I discussed the assessment and treatment plan with the patient. The patient was provided an opportunity to ask questions and all were answered. The patient agreed with the plan and demonstrated an understanding of the instructions. The patient was advised to call back or seek an in-person evaluation if the symptoms worsen or if the condition fails to improve as anticipated.   Total time spent: 12 minutes including face-to-face MyChart video visit time and time spent for planning, charting and coordination of care  Rulon Eisenmenger, MD 09/10/2021  I, Thana Ates am acting as scribe for Nicholas Lose, MD.  I have reviewed the above documentation for accuracy and completeness, and I agree with the above.

## 2021-09-10 ENCOUNTER — Inpatient Hospital Stay: Payer: BC Managed Care – PPO | Attending: Hematology and Oncology | Admitting: Hematology and Oncology

## 2021-09-10 ENCOUNTER — Telehealth: Payer: Self-pay | Admitting: Hematology and Oncology

## 2021-09-10 DIAGNOSIS — C50412 Malignant neoplasm of upper-outer quadrant of left female breast: Secondary | ICD-10-CM

## 2021-09-10 DIAGNOSIS — Z170 Estrogen receptor positive status [ER+]: Secondary | ICD-10-CM

## 2021-09-10 NOTE — Telephone Encounter (Signed)
Scheduled appointment per 11/29 los. Patient is aware. 

## 2021-09-16 ENCOUNTER — Ambulatory Visit: Payer: BC Managed Care – PPO | Admitting: Hematology and Oncology

## 2021-09-23 ENCOUNTER — Telehealth: Payer: Self-pay | Admitting: Hematology and Oncology

## 2021-09-23 NOTE — Telephone Encounter (Signed)
Patient will receive updated calendar.

## 2021-10-27 ENCOUNTER — Other Ambulatory Visit: Payer: Self-pay | Admitting: Hematology and Oncology

## 2021-10-28 ENCOUNTER — Encounter: Payer: Self-pay | Admitting: Hematology and Oncology

## 2021-11-09 ENCOUNTER — Other Ambulatory Visit: Payer: Self-pay | Admitting: Hematology and Oncology

## 2021-11-11 NOTE — Telephone Encounter (Signed)
I am not able to fill with it being a controlled substance. Can you please refill and add additional refills for the future. Thanks!

## 2021-12-11 ENCOUNTER — Other Ambulatory Visit: Payer: Self-pay | Admitting: Hematology and Oncology

## 2022-01-11 ENCOUNTER — Other Ambulatory Visit: Payer: Self-pay | Admitting: Hematology and Oncology

## 2022-01-11 DIAGNOSIS — I700 Atherosclerosis of aorta: Secondary | ICD-10-CM

## 2022-01-15 ENCOUNTER — Encounter: Payer: Self-pay | Admitting: Gastroenterology

## 2022-01-16 ENCOUNTER — Encounter: Payer: Self-pay | Admitting: Gastroenterology

## 2022-01-16 ENCOUNTER — Ambulatory Visit (AMBULATORY_SURGERY_CENTER): Payer: BC Managed Care – PPO | Admitting: *Deleted

## 2022-01-16 VITALS — Ht 74.0 in | Wt 270.0 lb

## 2022-01-16 DIAGNOSIS — Z1211 Encounter for screening for malignant neoplasm of colon: Secondary | ICD-10-CM

## 2022-01-16 MED ORDER — SUTAB 1479-225-188 MG PO TABS: 1.0000 | ORAL_TABLET | ORAL | 0 refills | Status: DC

## 2022-01-16 MED ORDER — ONDANSETRON HCL 4 MG PO TABS: 4.0000 mg | ORAL_TABLET | ORAL | 0 refills | Status: DC

## 2022-01-16 NOTE — Progress Notes (Signed)
Patient's pre-visit was done today over the phone with the patient. Name,DOB and address verified. Patient denies any allergies to Eggs and Soy. Patient denies any problems with anesthesia/sedation. Patient is not taking any diet pills or blood thinners. No home Oxygen. Insurance confirmed with patient. ? ?Prep instructions sent to pt's MyChart (if available) or mailed to pt-pt is aware. Patient understands to call us back with any questions or concerns. Patient is aware of our care-partner policy. Sutab pills per pt's request-coupon attached to Rx and mailed to pt. ? ?EMMI education assigned to the patient for the procedure, sent to [place].  ? ?The patient is COVID-19 vaccinated.   ?

## 2022-01-20 ENCOUNTER — Encounter: Payer: Self-pay | Admitting: Hematology and Oncology

## 2022-01-23 ENCOUNTER — Other Ambulatory Visit: Payer: Self-pay | Admitting: Hematology and Oncology

## 2022-01-28 ENCOUNTER — Encounter: Payer: Self-pay | Admitting: Gastroenterology

## 2022-01-28 ENCOUNTER — Ambulatory Visit (AMBULATORY_SURGERY_CENTER): Payer: BC Managed Care – PPO | Admitting: Gastroenterology

## 2022-01-28 VITALS — BP 139/70 | HR 64 | Temp 97.8°F | Resp 15 | Ht 74.0 in | Wt 270.0 lb

## 2022-01-28 DIAGNOSIS — D122 Benign neoplasm of ascending colon: Secondary | ICD-10-CM

## 2022-01-28 DIAGNOSIS — K640 First degree hemorrhoids: Secondary | ICD-10-CM

## 2022-01-28 DIAGNOSIS — Z1211 Encounter for screening for malignant neoplasm of colon: Secondary | ICD-10-CM

## 2022-01-28 DIAGNOSIS — D124 Benign neoplasm of descending colon: Secondary | ICD-10-CM

## 2022-01-28 MED ORDER — SODIUM CHLORIDE 0.9 % IV SOLN: 500.0000 mL | Freq: Once | INTRAVENOUS | Status: DC

## 2022-01-28 NOTE — Progress Notes (Signed)
Called to room to assist during endoscopic procedure.  Patient ID and intended procedure confirmed with present staff. Received instructions for my participation in the procedure from the performing physician.  

## 2022-01-28 NOTE — Progress Notes (Signed)
PT taken to PACU. Monitors in place. VSS. Report given to RN. 

## 2022-01-28 NOTE — Patient Instructions (Signed)
Handout on polyps and hemorrhoids provided  ? ?Await pathology results.  ? ?Continue current medications.  ? ? ?YOU HAD AN ENDOSCOPIC PROCEDURE TODAY AT [facility]:   Refer to the procedure report that was given to you for any specific questions about what was found during the examination.  If the procedure report does not answer your questions, please call your gastroenterologist to clarify.  If you requested that your care partner not be given the details of your procedure findings, then the procedure report has been included in a sealed envelope for you to review at your convenience later. ? ?YOU SHOULD EXPECT: Some feelings of bloating in the abdomen. Passage of more gas than usual.  Walking can help get rid of the air that was put into your GI tract during the procedure and reduce the bloating. If you had a lower endoscopy (such as a colonoscopy or flexible sigmoidoscopy) you may notice spotting of blood in your stool or on the toilet paper. If you underwent a bowel prep for your procedure, you may not have a normal bowel movement for a few days. ? ?Please Note:  You might notice some irritation and congestion in your nose or some drainage.  This is from the oxygen used during your procedure.  There is no need for concern and it should clear up in a day or so. ? ?SYMPTOMS TO REPORT IMMEDIATELY: ? ?Following lower endoscopy (colonoscopy or flexible sigmoidoscopy): ? Excessive amounts of blood in the stool ? Significant tenderness or worsening of abdominal pains ? Swelling of the abdomen that is new, acute ? Fever of 100?F or higher ? ? ?For urgent or emergent issues, a gastroenterologist can be reached at any hour by calling [phone]. ?Do not use MyChart messaging for urgent concerns.  ? ? ?DIET:  We do recommend a small meal at first, but then you may proceed to your regular diet.  Drink plenty of fluids but you should avoid alcoholic beverages for 24 hours. ? ?ACTIVITY:  You should plan  to take it easy for the rest of today and you should NOT DRIVE or use heavy machinery until tomorrow (because of the sedation medicines used during the test).   ? ?FOLLOW UP: ?Our staff will call the number listed on your records 48-72 hours following your procedure to check on you and address any questions or concerns that you may have regarding the information given to you following your procedure. If we do not reach you, we will leave a message.  We will attempt to reach you two times.  During this call, we will ask if you have developed any symptoms of COVID 19. If you develop any symptoms (ie: fever, flu-like symptoms, shortness of breath, cough etc.) before then, please call [phone].  If you test positive for Covid 19 in the 2 weeks post procedure, please call and report this information to [place].   ? ?If any biopsies were taken you will be contacted by phone or by letter within the next 1-3 weeks.  Please call us at [phone] if you have not heard about the biopsies in 3 weeks.  ? ? ?SIGNATURES/CONFIDENTIALITY: ?You and/or your care partner have signed paperwork which will be entered into your electronic medical record.  These signatures attest to the fact that that the information above on your After Visit Summary has been reviewed and is understood.  Full responsibility of the confidentiality of this discharge information lies with you and/or your care-partner. ? ?

## 2022-01-28 NOTE — Progress Notes (Signed)
Pt's states no medical or surgical changes since previsit or office visit. 

## 2022-01-28 NOTE — Op Note (Signed)
[place] ?Patient Name: [name] ?Procedure Date: 01/28/2022 11:46 AM ?MRN: [id number] ?Endoscopist: Gerrit Heck , MD ?Age: 61 ?Referring MD:  ?Date of Birth: September 04, 1961 ?Gender: Female ?Account #: [id number] ?Procedure:                Colonoscopy ?Indications:              Screening for colorectal malignant neoplasm, This  ?                          is the patient's first colonoscopy ?Medicines:                Monitored Anesthesia Care ?Procedure:                Pre-Anesthesia Assessment: ?                          - Prior to the procedure, a History and Physical  ?                          was performed, and patient medications and  ?                          allergies were reviewed. The patient's tolerance of  ?                          previous anesthesia was also reviewed. The risks  ?                          and benefits of the procedure and the sedation  ?                          options and risks were discussed with the patient.  ?                          All questions were answered, and informed consent  ?                          was obtained. Prior Anticoagulants: The patient has  ?                          taken no previous anticoagulant or antiplatelet  ?                          agents. ASA Grade Assessment: II - A patient with  ?                          mild systemic disease. After reviewing the risks  ?                          and benefits, the patient was deemed in  ?                          satisfactory condition to undergo the procedure. ?  After obtaining informed consent, the colonoscope  ?                          was passed under direct vision. Throughout the  ?                          procedure, the patient's blood pressure, pulse, and  ?                          oxygen saturations were monitored continuously. The  ?                          CF [id number] #[id number] was introduced through the anus  ?                          and advanced to  the the cecum, identified by  ?                          appendiceal orifice and ileocecal valve. The  ?                          colonoscopy was performed without difficulty. The  ?                          patient tolerated the procedure well. The quality  ?                          of the bowel preparation was good. The ileocecal  ?                          valve, appendiceal orifice, and rectum were  ?                          photographed. ?Scope In: 12:03:40 PM ?Scope Out: 12:23:49 PM ?Scope Withdrawal Time: 0 hours 16 minutes 14 seconds  ?Total Procedure Duration: 0 hours 20 minutes 9 seconds  ?Findings:                 The perianal and digital rectal examinations were  ?                          normal. ?                          Four sessile polyps were found in the descending  ?                          colon (2) and ascending colon (2). The polyps were  ?                          3 to 4 mm in size. These polyps were removed with a  ?                          cold snare. Resection and retrieval were complete.  ?  Estimated blood loss was minimal. ?                          Non-bleeding internal hemorrhoids were found during  ?                          retroflexion. The hemorrhoids were small. ?Complications:            No immediate complications. ?Estimated Blood Loss:     Estimated blood loss was minimal. ?Impression:               - Four 3 to 4 mm polyps in the descending colon and  ?                          in the ascending colon, removed with a cold snare.  ?                          Resected and retrieved. ?                          - Non-bleeding internal hemorrhoids. ?Recommendation:           - Patient has a contact number available for  ?                          emergencies. The signs and symptoms of potential  ?                          delayed complications were discussed with the  ?                          patient. Return to normal activities tomorrow.  ?                           Written discharge instructions were provided to the  ?                          patient. ?                          - Resume previous diet. ?                          - Continue present medications. ?                          - Await pathology results. ?                          - Repeat colonoscopy for surveillance based on  ?                          pathology results. ?                          - Return to GI clinic PRN. ?Gerrit Heck, MD ?01/28/2022 12:27:19 PM ?

## 2022-01-28 NOTE — Progress Notes (Signed)
? ?GASTROENTEROLOGY PROCEDURE H&P NOTE  ? ?Primary Care Physician: ?[name], NP ? ? ? ?Reason for Procedure:   Colon cancer screening ? ?Plan:    Colonoscopy ? ?Patient is appropriate for endoscopic procedure(s) in the ambulatory ([place]) setting. ? ?The nature of the procedure, as well as the risks, benefits, and alternatives were carefully and thoroughly reviewed with the patient. Ample time for discussion and questions allowed. The patient understood, was satisfied, and agreed to proceed.  ? ? ? ?HPI: ?[name] is a 61 y.o. female who presents for Colonoscopy for CRC screening. No active GI sxs.  ? ?Past Medical History:  ?Diagnosis Date  ? Complication of anesthesia   ? PONV  ? Fatigue   ? GERD (gastroesophageal reflux disease)   ? w nonobstructing esophageal stricture  ? Headache   ? Heat intolerance   ? History of ETT 03/2009  ? myoview EF 65%, normal wall motion, no ischemia or infarction, poor exercise capacity  ? HTN (hypertension)   ? left breast ca dx'd 10/2018  ? breast- left  ? Motion sickness   ? Normal echocardiogram 03/2009  ? EF 88-11% moderate diastolic dysfunction, mild LAE  ? Numbness and tingling   ? Obesity   ? PONV (postoperative nausea and vomiting)   ? S/P partial hysterectomy   ? ? ?Past Surgical History:  ?Procedure Laterality Date  ? ABDOMINAL HYSTERECTOMY  2007  ? partial   ? AXILLARY LYMPH NODE DISSECTION Left 03/11/2019  ? Procedure: LEFT AXILLARY LYMPH NODE DISSECTION;  Surgeon: Erroll Luna, MD;  Location: [place] [facility];  Service: General;  Laterality: Left;  ? BREAST LUMPECTOMY WITH RADIOACTIVE SEED AND SENTINEL LYMPH NODE BIOPSY Left 02/22/2019  ? Procedure: LEFT BREAST LUMPECTOMY x2 WITH RADIOACTIVE SEED AND LEFT AXILLA SEED  GUIDED LYMPH NODE BIOPSY AND LEFT SENTINEL LYMPH NODE MAPPING;  Surgeon: Erroll Luna, MD;  Location: [facility];  Service: General;  Laterality: Left;  ? CARPAL TUNNEL RELEASE Bilateral 2018  ? FOOT  SURGERY Bilateral 2016  ? hysterectomy (other)    ? PORT-A-CATH REMOVAL N/A 02/22/2019  ? Procedure: REMOVAL PORT-A-CATH;  Surgeon: Erroll Luna, MD;  Location: [place];  Service: General;  Laterality: N/A;  ? PORTACATH PLACEMENT N/A 11/10/2018  ? Procedure: INSERTION PORT-A-CATH WITH ULTRASOUND;  Surgeon: Erroll Luna, MD;  Location: [place];  Service: General;  Laterality: N/A;  ? TONSILLECTOMY  1968  ? UPPER GASTROINTESTINAL ENDOSCOPY    ? ? ?Prior to Admission medications   ?Medication Sig Start Date End Date Taking? Authorizing Provider  ?anastrozole (ARIMIDEX) 1 MG tablet TAKE 1 TABLET BY MOUTH EVERY DAY 05/13/21  Yes Nicholas Lose, MD  ?atorvastatin (LIPITOR) 10 MG tablet TAKE 1 TABLET BY MOUTH EVERY DAY 01/13/22  Yes Nicholas Lose, MD  ?carvedilol (COREG) 6.25 MG tablet TAKE 1 TABLET BY MOUTH TWICE DAILY ?Patient taking differently: Take 6.25 mg by mouth 2 (two) times daily with a meal. 03/03/15  Yes Shelly Bombard, MD  ?lisinopril-hydrochlorothiazide (ZESTORETIC) 20-25 MG tablet Take 1 tablet by mouth daily. 01/08/19  Yes [provider]  ?naproxen (NAPROSYN) 500 MG tablet Take 1 tablet (500 mg total) by mouth 2 (two) times daily with a meal. 06/28/20  Yes Nicholas Lose, MD  ?omeprazole (PRILOSEC) 20 MG capsule TAKE 1 CAPSULE BY MOUTH EVERY DAY 01/23/22  Yes Nicholas Lose, MD  ?ondansetron (ZOFRAN) 4 MG tablet Take 1 tablet (4 mg total) by mouth as directed. Take one Zofran pill 30-60  minutes before each colonoscopy prep dose 01/16/22  Yes Lyris Hitchman V, DO  ?pregabalin (LYRICA) 50 MG capsule TAKE 1 CAPSULE BY MOUTH THREE TIMES A DAY 12/11/21  Yes Nicholas Lose, MD  ?aspirin EC 81 MG tablet Take 1 tablet (81 mg total) by mouth daily. 02/02/20   Adrian Prows, MD  ? ? ?Current Outpatient Medications  ?Medication Sig Dispense Refill  ? anastrozole (ARIMIDEX) 1 MG tablet TAKE 1 TABLET BY MOUTH EVERY DAY 90 tablet 3  ? atorvastatin (LIPITOR) 10 MG tablet TAKE 1 TABLET BY MOUTH EVERY DAY  90 tablet 3  ? carvedilol (COREG) 6.25 MG tablet TAKE 1 TABLET BY MOUTH TWICE DAILY (Patient taking differently: Take 6.25 mg by mouth 2 (two) times daily with a meal.) 180 tablet 1  ? lisinopril-hydrochlorothiazide (ZESTORETIC) 20-25 MG tablet Take 1 tablet by mouth daily.    ? naproxen (NAPROSYN) 500 MG tablet Take 1 tablet (500 mg total) by mouth 2 (two) times daily with a meal. 60 tablet 3  ? omeprazole (PRILOSEC) 20 MG capsule TAKE 1 CAPSULE BY MOUTH EVERY DAY 90 capsule 3  ? ondansetron (ZOFRAN) 4 MG tablet Take 1 tablet (4 mg total) by mouth as directed. Take one Zofran pill 30-60 minutes before each colonoscopy prep dose 2 tablet 0  ? pregabalin (LYRICA) 50 MG capsule TAKE 1 CAPSULE BY MOUTH THREE TIMES A DAY 90 capsule 0  ? aspirin EC 81 MG tablet Take 1 tablet (81 mg total) by mouth daily. 90 tablet 3  ? ?Current Facility-Administered Medications  ?Medication Dose Route Frequency Provider Last Rate Last Admin  ? 0.9 %  sodium chloride infusion  500 mL Intravenous Once Najla Aughenbaugh V, DO      ? ?Facility-Administered Medications Ordered in Other Visits  ?Medication Dose Route Frequency Provider Last Rate Last Admin  ? gadopentetate dimeglumine (MAGNEVIST) injection 20 mL  20 mL Intravenous Once PRN Melvenia Beam, MD      ? gadopentetate dimeglumine (MAGNEVIST) injection 20 mL  20 mL Intravenous Once PRN Melvenia Beam, MD      ? ? ?Allergies as of 01/28/2022  ? (No Known Allergies)  ? ? ?Family History  ?Problem Relation Age of Onset  ? Heart attack Mother 69  ? Heart disease Mother   ? Diabetes Sister   ? Heart attack Maternal Grandmother   ?     age 50 or 52  ? Heart disease Maternal Grandmother   ? Neuropathy Neg Hx   ? Colon cancer Neg Hx   ? Colon polyps Neg Hx   ? Esophageal cancer Neg Hx   ? Stomach cancer Neg Hx   ? Rectal cancer Neg Hx   ? ? ?Social History  ? ?Socioeconomic History  ? Marital status: Married  ?  Spouse name: Not on file  ? Number of children: 2  ? Years of education: Not  on file  ? Highest education level: Not on file  ?Occupational History  ? Occupation: Pharmacist, hospital  ?Tobacco Use  ? Smoking status: Never  ? Smokeless tobacco: Never  ?Vaping Use  ? Vaping Use: Never used  ?Substance and Sexual Activity  ? Alcohol use: No  ? Drug use: No  ? Sexual activity: Yes  ?  Birth control/protection: Surgical  ?Other Topics Concern  ? Not on file  ?Social History Narrative  ? Teacher  ? Lives at home w/ her family  ? Right-handed  ? Caffeine: 1/2 cup coffee during the school year, tea  ? ?  Social Determinants of Health  ? ?Financial Resource Strain: Not on file  ?Food Insecurity: Not on file  ?Transportation Needs: Not on file  ?Physical Activity: Not on file  ?Stress: Not on file  ?Social Connections: Not on file  ?Intimate Partner Violence: Not on file  ? ? ?Physical Exam: ?Vital signs in last 24 hours: ?'@BP'$  (!) 145/68   Pulse 82   Temp 97.8 ?F (36.6 ?C)   Ht '6\' 2"'$  (1.88 m)   Wt 270 lb (122.5 kg)   SpO2 98%   BMI 34.67 kg/m?  ?GEN: NAD ?EYE: Sclerae anicteric ?ENT: MMM ?CV: Non-tachycardic ?Pulm: CTA b/l ?GI: Soft, NT/ND ?NEURO:  Alert & Oriented x 3 ? ? ?Gerrit Heck, DO ?[facility] ? ? ?01/28/2022 11:57 AM ? ?

## 2022-01-30 ENCOUNTER — Telehealth: Payer: Self-pay | Admitting: *Deleted

## 2022-01-30 NOTE — Telephone Encounter (Signed)
?  Follow up Call- ? ? ?  01/28/2022  ? 11:13 AM  ?Call back number  ?Post procedure Call Back phone  # [phone]  ?Permission to leave phone message Yes  ?  ? ?Patient questions: ? ?Do you have a fever, pain , or abdominal swelling? No. ?Pain Score  0 * ? ?Have you tolerated food without any problems? Yes.   ? ?Have you been able to return to your normal activities? Yes.   ? ?Do you have any questions about your discharge instructions: ?Diet   No. ?Medications  No. ?Follow up visit  No. ? ?Do you have questions or concerns about your Care? No. ? ?Actions: ?* If pain score is 4 or above: ?No action needed, pain <4. ? ? ?

## 2022-02-05 ENCOUNTER — Encounter: Payer: Self-pay | Admitting: Gastroenterology

## 2022-04-09 ENCOUNTER — Ambulatory Visit: Payer: BC Managed Care – PPO | Admitting: Podiatry

## 2022-04-09 ENCOUNTER — Encounter: Payer: Self-pay | Admitting: Podiatry

## 2022-04-09 DIAGNOSIS — B351 Tinea unguium: Secondary | ICD-10-CM

## 2022-04-09 DIAGNOSIS — T451X5A Adverse effect of antineoplastic and immunosuppressive drugs, initial encounter: Secondary | ICD-10-CM

## 2022-04-09 DIAGNOSIS — G620 Drug-induced polyneuropathy: Secondary | ICD-10-CM

## 2022-04-09 DIAGNOSIS — M79675 Pain in left toe(s): Secondary | ICD-10-CM

## 2022-04-09 DIAGNOSIS — M792 Neuralgia and neuritis, unspecified: Secondary | ICD-10-CM

## 2022-04-09 DIAGNOSIS — M79674 Pain in right toe(s): Secondary | ICD-10-CM

## 2022-04-09 NOTE — Patient Instructions (Signed)
Nervive Pain Cream or Roll On available at any drug store. Apply as directed.

## 2022-04-18 NOTE — Progress Notes (Signed)
  Subjective:  Patient ID: [name], female    DOB: 14-Nov-1960,  MRN: [id number]  [name] presents to clinic today for at risk foot care with h/o neuropathy secondary to chemotherapy and painful thick toenails that are difficult to trim. Pain interferes with ambulation. Aggravating factors include wearing enclosed shoe gear. Pain is relieved with periodic professional debridement.  Patient has h/o neuropathic pain and is also requesting new Rx for non-formulary compounded neuropathy cream from [place].  New problem(s): None.   She is accompanied by her Mother and grandson on today's visit.  PCP is [name], NP , and last visit was October 07, 2021.  No Known Allergies  Review of Systems: Negative except as noted in the HPI.  Objective:  [name] is a pleasant 61 y.o. female, in NAD. AAO x 3.  Vascular Examination: Vascular status intact b/l with palpable pedal pulses. Pedal hair present b/l. CFT immediate b/l. No edema. No pain with calf compression b/l. Skin temperature gradient WNL b/l. No cyanosis or clubbing noted b/l LE.  Neurological Examination: Sensation grossly intact b/l with 10 gram monofilament. Vibratory sensation intact b/l. Pt has subjective symptoms of neuropathy.  Dermatological Examination: Pedal skin with normal turgor, texture and tone b/l.  No open wounds b/l LE. No interdigital macerations noted b/l LE. Toenails 1-5 b/l elongated, discolored, dystrophic, thickened, crumbly with subungual debris and tenderness to dorsal palpation. No hyperkeratotic nor porokeratotic lesions present on today's visit.  Musculoskeletal Examination: Normal muscle strength 5/5 to all lower extremity muscle groups bilaterally. No pain, crepitus or joint limitation noted with ROM b/l LE. No gross bony pedal deformities b/l. Patient ambulates independently without assistive aids.  Radiographs: None  Assessment/Plan: 1. Pain  due to onychomycosis of toenails of both feet   2. Neuropathic pain   3. Chemotherapy-induced neuropathy ([place])     -Patient was evaluated and treated. All patient's and/or POA's questions/concerns answered on today's visit. -Patient to continue soft, supportive shoe gear daily. -Mycotic toenails were debrided in length and girth 1-5 bilaterally with sterile nail nippers and dremel. Pinpoint bleeding of digit addressed with Lumicain Hemostatic Solution, cleansed with alcohol. triple antibiotic ointment applied. Patient/caregiver instructed to apply triple antibiotic ointment once daily for 7 days. We also discussed Nervive Pain Cream available OTC as an alternative should Rx become too expensive. Refill for nonformulary compounded neuropathy cream sent to [facility]. Continue daily use of topical compounded antifungal medication from [place] as instructed.  Return in about 3 months (around 07/10/2022).  Marzetta Board, DPM

## 2022-04-28 ENCOUNTER — Inpatient Hospital Stay: Payer: BC Managed Care – PPO | Attending: Hematology and Oncology | Admitting: Hematology and Oncology

## 2022-04-28 NOTE — Assessment & Plan Note (Signed)
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 AX L&D 03/11/2019: 3/14 lymph nodes positive with focal extranodal [id number]  Treatment plan: 1.Neoadjuvant chemotherapy with dose dense Adriamycin and Cytoxan every 2 weeks x4 followed by Taxol weekly x3discontinued due to severe peripheral neuropathycompleted 01/20/2019 2.Followed by [facility] 02/22/2019 3.Followed by radiation7/05/2019-06/02/2019 4.Followed by antiestrogen therapy.06/02/2019 ----------------------------------------------------------------------------------------------------------------------------------------------------- Current treatment: Anastrozole started 06/02/2019 Anastrozole toxicities:Hot flashes (night sweats)  Lung nodules: Repeat CT scan 12/19/2019: Stable subcentimeter lung nodules bilaterally suggesting benign etiology CT 06/27/2020: Bilateral lower lobe pulmonary nodules. Overall impression is stable nodules (some mild increase from 3 to 5 mm and 4 to 5 mm and 5 to 7 mm 01/31/21: CT chest: Stable tiny lung nodules Pulm HTN. 09/06/21: CT Chest:  Stable lung nodules favored benign  Breast cancer surveillance: Breast exam 04/28/2022: Benign Mammogram 07/22/21: Benign breast density category A Bone density 10/25/2019: T score -0.9: Normal Liver MRI 10/13/2019: No findings of metastatic disease. Indeterminate right  hepatic lobe lesion favored to be a cyst  Work stresses her out.  Return to clinicin 1 year for follow-up

## 2022-04-29 ENCOUNTER — Encounter: Payer: Self-pay | Admitting: Hematology and Oncology

## 2022-04-29 NOTE — Progress Notes (Signed)
This encounter was created in error - please disregard.

## 2022-05-05 ENCOUNTER — Other Ambulatory Visit: Payer: Self-pay | Admitting: *Deleted

## 2022-05-05 ENCOUNTER — Other Ambulatory Visit: Payer: Self-pay | Admitting: Hematology and Oncology

## 2022-05-05 ENCOUNTER — Other Ambulatory Visit: Payer: Self-pay

## 2022-05-05 MED ORDER — PREGABALIN 50 MG PO CAPS: ORAL_CAPSULE | ORAL | 3 refills | Status: DC

## 2022-05-29 ENCOUNTER — Encounter: Payer: Self-pay | Admitting: Podiatry

## 2022-05-29 ENCOUNTER — Ambulatory Visit (INDEPENDENT_AMBULATORY_CARE_PROVIDER_SITE_OTHER): Payer: BC Managed Care – PPO | Admitting: Podiatry

## 2022-05-29 ENCOUNTER — Ambulatory Visit (INDEPENDENT_AMBULATORY_CARE_PROVIDER_SITE_OTHER): Payer: BC Managed Care – PPO

## 2022-05-29 DIAGNOSIS — I872 Venous insufficiency (chronic) (peripheral): Secondary | ICD-10-CM

## 2022-05-29 DIAGNOSIS — M7672 Peroneal tendinitis, left leg: Secondary | ICD-10-CM

## 2022-05-29 DIAGNOSIS — S161XXA Strain of muscle, fascia and tendon at neck level, initial encounter: Secondary | ICD-10-CM | POA: Insufficient documentation

## 2022-05-29 DIAGNOSIS — M778 Other enthesopathies, not elsewhere classified: Secondary | ICD-10-CM

## 2022-05-29 DIAGNOSIS — M542 Cervicalgia: Secondary | ICD-10-CM | POA: Insufficient documentation

## 2022-05-29 NOTE — Progress Notes (Signed)
[name] presents today after having not seen her since last year.  She presents today for follow-up of her peroneal tendinitis and aching in the top of her left foot.  States that it aches and swells at here so badly.  She states that she also has thick toenails that she was told to discuss with [place] by her primary care provider.  Objective: Vital signs are stable alert oriented x3 pulses are palpable.  She has pitting edema with hyperpigmentation along the anterior leg second grade 2 to grade 3 pitting edema that extends over into the foot and medial lateral ankle.  She has tenderness on palpation of the deep peroneal nerve most likely due to the swelling from her previous surgery where the fluid collects in that area.  Assessment & venous insufficiency most likely however cannot rule out any cardiac issues as well she needs a good thorough work-up.  Plan: We will send initially for venous studies and should this come back abnormal we will follow-up with her.  Otherwise we will continue treatment with cardiac referral and primary care referral.

## 2022-06-12 ENCOUNTER — Ambulatory Visit: Payer: BC Managed Care – PPO | Admitting: Podiatry

## 2022-06-13 ENCOUNTER — Telehealth: Payer: Self-pay | Admitting: Hematology and Oncology

## 2022-06-13 ENCOUNTER — Other Ambulatory Visit: Payer: Self-pay | Admitting: Hematology and Oncology

## 2022-06-13 ENCOUNTER — Ambulatory Visit (HOSPITAL_COMMUNITY): Payer: BC Managed Care – PPO

## 2022-06-13 MED ORDER — ANASTROZOLE 1 MG PO TABS: 1.0000 mg | ORAL_TABLET | Freq: Every day | ORAL | 3 refills | Status: DC

## 2022-06-13 NOTE — Telephone Encounter (Signed)
Scheduled per 08/31 scheduled message, patient has been called and notified.

## 2022-06-14 ENCOUNTER — Other Ambulatory Visit: Payer: Self-pay

## 2022-06-17 DIAGNOSIS — Z4789 Encounter for other orthopedic aftercare: Secondary | ICD-10-CM | POA: Insufficient documentation

## 2022-06-19 ENCOUNTER — Other Ambulatory Visit: Payer: Self-pay

## 2022-06-19 NOTE — Progress Notes (Signed)
Patient Care Team: [name], NP as PCP - General Erroll Luna, MD as Consulting Physician (General Surgery) Nicholas Lose, MD as Consulting Physician (Hematology and Oncology) Kyung Rudd, MD as Consulting Physician (Radiation Oncology)  DIAGNOSIS: No diagnosis found.  SUMMARY OF ONCOLOGIC HISTORY: Oncology History  Malignant neoplasm of upper-outer quadrant of left breast in female, estrogen receptor positive ([place])  10/22/2018 Initial Diagnosis   Screening detected left breast architectural distortion: 4.5 cm, UOQ, by ultrasound measured 5 cm at 1 o'clock position multiple enlarged lymph nodes, biopsy revealed grade 1 IDC with DCIS with lymphovascular invasion, lymph node biopsy positive, intramammary lymph node biopsy negative, ER 100%, PR 100%, Ki-67 20%, HER-2 1+ by IHC, T2 and 1 stage 2A   10/27/2018 Cancer Staging   Staging form: Breast, AJCC 8th Edition - Clinical: Stage IIA (cT2, cN1(f), cM0, G1, ER+, PR+, HER2-) - Signed by Nicholas Lose, MD on 11/01/2018   11/11/2018 - 12/20/2018 Neo-Adjuvant Chemotherapy   Neoadjuvant chemotherapy with dose dense Adriamycin and Cytoxan every 2 weeks x4 followed by Taxol weekly x12 (stopped after cycle 3 due to severe neuropathy)   02/01/2019 Breast MRI   Known malignancy in left breast measures 4.7 x 3.5 x 7.2cm, previously 5 x 3 x 10cm.    02/22/2019 Surgery   Left lumpectomy ([facility]): IDC with DCIS, 5.8cm, HER2 negative, ER 90%, PR 90%, Ki67 2%, clear margins,3/5 LN positive for malignancy.    03/02/2019 Cancer Staging   Staging form: Breast, AJCC 8th Edition - Pathologic stage from 03/02/2019: No Stage Recommended (ypT3, pN1a, cM0, G2, ER+, PR+, HER2-) - Signed by Nicholas Lose, MD on 03/02/2019   03/11/2019 Surgery   Left axillary lymph node dissection ([facility]): metastatic carcinoma in 3/14 lymph nodes, focal extranodal involvement by tumor.   04/20/2019 - 06/02/2019 Radiation Therapy   Adjuvant radiation therapy   05/30/2019 -   Anti-estrogen oral therapy   Anastrozole 1 mg daily     CHIEF COMPLIANT: Follow-up of left breast cancer on anastrozole    INTERVAL HISTORY: [name] is a 61 y.o. female with above-mentioned history of breast cancer treated with neoadjuvant chemotherapy, lumpectomy and axillary lymph node dissection, radiation, and who is currently on anti-estrogen therapy with anastrozole.   ALLERGIES:  has No Known Allergies.  MEDICATIONS:  Current Outpatient Medications  Medication Sig Dispense Refill   anastrozole (ARIMIDEX) 1 MG tablet Take 1 tablet (1 mg total) by mouth daily. 90 tablet 3   aspirin EC 81 MG tablet Take 1 tablet (81 mg total) by mouth daily. 90 tablet 3   atorvastatin (LIPITOR) 10 MG tablet Take 1 tablet by mouth daily.     carvedilol (COREG) 6.25 MG tablet TAKE 1 TABLET BY MOUTH TWICE DAILY (Patient taking differently: Take 6.25 mg by mouth 2 (two) times daily with a meal.) 180 tablet 1   lisinopril-hydrochlorothiazide (ZESTORETIC) 20-25 MG tablet Take 1 tablet by mouth daily.     meloxicam (MOBIC) 15 MG tablet Take 15 mg by mouth daily as needed.     methocarbamol (ROBAXIN) 500 MG tablet Take 1,000 mg by mouth 4 (four) times daily as needed.     naproxen (NAPROSYN) 500 MG tablet Take 1 tablet (500 mg total) by mouth 2 (two) times daily with a meal. 60 tablet 3   omeprazole (PRILOSEC) 20 MG capsule TAKE 1 CAPSULE BY MOUTH EVERY DAY 90 capsule 3   ondansetron (ZOFRAN) 4 MG tablet Take 1 tablet (4 mg total) by mouth as directed. Take one Zofran  pill 30-60 minutes before each colonoscopy prep dose 2 tablet 0   pantoprazole (PROTONIX) 40 MG tablet Take 1 tablet by mouth daily.     pregabalin (LYRICA) 50 MG capsule TAKE 1 CAPSULE BY MOUTH THREE TIMES A DAY 90 capsule 3   Current Facility-Administered Medications  Medication Dose Route Frequency Provider Last Rate Last Admin   0.9 %  sodium chloride infusion  500 mL Intravenous Once Cirigliano, Vito V, DO        Facility-Administered Medications Ordered in Other Visits  Medication Dose Route Frequency Provider Last Rate Last Admin   gadopentetate dimeglumine (MAGNEVIST) injection 20 mL  20 mL Intravenous Once PRN Melvenia Beam, MD       gadopentetate dimeglumine (MAGNEVIST) injection 20 mL  20 mL Intravenous Once PRN Melvenia Beam, MD        PHYSICAL EXAMINATION: ECOG PERFORMANCE STATUS: {CHL ONC ECOG AT:[id number]}  There were no vitals filed for this visit. There were no vitals filed for this visit.  BREAST:*** No palpable masses or nodules in either right or left breasts. No palpable axillary supraclavicular or infraclavicular adenopathy no breast tenderness or nipple discharge. (exam performed in the presence of a chaperone)  LABORATORY DATA:  I have reviewed the data as listed    Latest Ref Rng & Units 09/06/2021    7:54 AM 01/30/2021    8:18 AM 09/27/2020    8:16 AM  CMP  Glucose 70 - 99 mg/dL  101  99   BUN 6 - 20 mg/dL  16  15   Creatinine 0.44 - 1.00 mg/dL 1.00  0.98  1.02   Sodium 135 - 145 mmol/L  143  141   Potassium 3.5 - 5.1 mmol/L  3.9  3.8   Chloride 98 - 111 mmol/L  105  106   CO2 22 - 32 mmol/L  30  29   Calcium 8.9 - 10.3 mg/dL  9.4  9.8   Total Protein 6.5 - 8.1 g/dL  6.9  7.2   Total Bilirubin 0.3 - 1.2 mg/dL  0.4  0.5   Alkaline Phos 38 - 126 U/L  85  90   AST 15 - 41 U/L  16  17   ALT 0 - 44 U/L  15  16     Lab Results  Component Value Date   WBC 3.9 (L) 01/30/2021   HGB 12.0 01/30/2021   HCT 37.7 01/30/2021   MCV 87.5 01/30/2021   PLT 158 01/30/2021   NEUTROABS 2.4 01/30/2021    ASSESSMENT & PLAN:  No problem-specific Assessment & Plan notes found for this encounter.    No orders of the defined types were placed in this encounter.  The patient has a good understanding of the overall plan. she agrees with it. she will call with any problems that may develop before the next visit here. Total time spent: 30 mins including face to face time  and time spent for planning, charting and co-ordination of care   Suzzette Righter, [place] 06/19/22    I Gardiner Coins am scribing for Dr. Lindi Adie  ***

## 2022-06-23 ENCOUNTER — Inpatient Hospital Stay: Payer: BC Managed Care – PPO | Attending: Hematology and Oncology | Admitting: Hematology and Oncology

## 2022-06-23 ENCOUNTER — Other Ambulatory Visit: Payer: Self-pay

## 2022-06-23 VITALS — BP 128/85 | HR 79 | Temp 97.3°F | Resp 18 | Ht 74.0 in | Wt 283.6 lb

## 2022-06-23 DIAGNOSIS — R918 Other nonspecific abnormal finding of lung field: Secondary | ICD-10-CM | POA: Insufficient documentation

## 2022-06-23 DIAGNOSIS — R109 Unspecified abdominal pain: Secondary | ICD-10-CM | POA: Diagnosis not present

## 2022-06-23 DIAGNOSIS — Z79899 Other long term (current) drug therapy: Secondary | ICD-10-CM | POA: Insufficient documentation

## 2022-06-23 DIAGNOSIS — M5489 Other dorsalgia: Secondary | ICD-10-CM | POA: Diagnosis not present

## 2022-06-23 DIAGNOSIS — C50412 Malignant neoplasm of upper-outer quadrant of left female breast: Secondary | ICD-10-CM | POA: Diagnosis present

## 2022-06-23 DIAGNOSIS — Z170 Estrogen receptor positive status [ER+]: Secondary | ICD-10-CM | POA: Diagnosis not present

## 2022-06-23 DIAGNOSIS — Z9221 Personal history of antineoplastic chemotherapy: Secondary | ICD-10-CM | POA: Diagnosis not present

## 2022-06-23 DIAGNOSIS — Z923 Personal history of irradiation: Secondary | ICD-10-CM | POA: Diagnosis not present

## 2022-06-23 DIAGNOSIS — Z79811 Long term (current) use of aromatase inhibitors: Secondary | ICD-10-CM | POA: Diagnosis not present

## 2022-06-23 NOTE — Assessment & Plan Note (Signed)
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 AX L&D 03/11/2019: 3/14 lymph nodes positive with focal extranodal [id number]  Treatment plan: 1.Neoadjuvant chemotherapy with dose dense Adriamycin and Cytoxan every 2 weeks x4 followed by Taxol weekly x3discontinued due to severe peripheral neuropathycompleted 01/20/2019 2.Followed by [facility] 02/22/2019 3.Followed by radiation7/05/2019-06/02/2019 4.Followed by antiestrogen therapy.06/02/2019 ----------------------------------------------------------------------------------------------------------------------------------------------------- Current treatment: Anastrozole started 06/02/2019 Anastrozole toxicities:Hot flashes (night sweats)  Lung nodules: Repeat CT scan 12/19/2019: Stable subcentimeter lung nodules bilaterally suggesting benign etiology CT 06/27/2020: Bilateral lower lobe pulmonary nodules. Overall impression is stable nodules (some mild increase from 3 to 5 mm and 4 to 5 mm and 5 to 7 mm 01/31/21: CT chest: Stable tiny lung nodules Pulm HTN. 09/06/21: CT Chest:  Stable lung nodules favored benign  Breast cancer surveillance: Mammogram 07/22/21: Benign breast density category A Bone density 10/25/2019: T score -0.9: Normal Liver MRI 10/13/2019: No findings of metastatic disease. Indeterminate right hepatic lobe lesion favored to  be a cyst  Return to clinic in 1 year for follow-up

## 2022-06-25 ENCOUNTER — Telehealth: Payer: Self-pay | Admitting: Hematology and Oncology

## 2022-06-25 NOTE — Telephone Encounter (Signed)
Scheduled appointment per 9/11 los. Patient is aware.

## 2022-06-26 ENCOUNTER — Ambulatory Visit: Payer: BC Managed Care – PPO | Admitting: Podiatry

## 2022-06-26 ENCOUNTER — Other Ambulatory Visit: Payer: Self-pay

## 2022-07-15 ENCOUNTER — Ambulatory Visit (HOSPITAL_COMMUNITY)
Admission: RE | Admit: 2022-07-15 | Discharge: 2022-07-15 | Disposition: A | Discharge status: Home / Self Care | Payer: BC Managed Care – PPO | Source: Ambulatory Visit | Attending: Hematology and Oncology | Admitting: Hematology and Oncology

## 2022-07-15 ENCOUNTER — Other Ambulatory Visit: Payer: Self-pay | Admitting: Oncology

## 2022-07-15 DIAGNOSIS — C50412 Malignant neoplasm of upper-outer quadrant of left female breast: Secondary | ICD-10-CM | POA: Insufficient documentation

## 2022-07-15 DIAGNOSIS — Z170 Estrogen receptor positive status [ER+]: Secondary | ICD-10-CM | POA: Diagnosis present

## 2022-07-15 MED ORDER — IOHEXOL 300 MG/ML  SOLN
100.0000 mL | Freq: Once | INTRAMUSCULAR | Status: AC | PRN
  Administered 2022-07-15: 100 mL via INTRAVENOUS

## 2022-07-15 NOTE — Progress Notes (Signed)
Patient Care Team: [name], NP as PCP - General Erroll Luna, MD as Consulting Physician (General Surgery) Nicholas Lose, MD as Consulting Physician (Hematology and Oncology) Kyung Rudd, MD as Consulting Physician (Radiation Oncology)  DIAGNOSIS:  Encounter Diagnosis  Name Primary?   Malignant neoplasm of upper-outer quadrant of left breast in female, estrogen receptor positive ([place])     SUMMARY OF ONCOLOGIC HISTORY: Oncology History  Malignant neoplasm of upper-outer quadrant of left breast in female, estrogen receptor positive ([place])  10/22/2018 Initial Diagnosis   Screening detected left breast architectural distortion: 4.5 cm, UOQ, by ultrasound measured 5 cm at 1 o'clock position multiple enlarged lymph nodes, biopsy revealed grade 1 IDC with DCIS with lymphovascular invasion, lymph node biopsy positive, intramammary lymph node biopsy negative, ER 100%, PR 100%, Ki-67 20%, HER-2 1+ by IHC, T2 and 1 stage 2A   10/27/2018 Cancer Staging   Staging form: Breast, AJCC 8th Edition - Clinical: Stage IIA (cT2, cN1(f), cM0, G1, ER+, PR+, HER2-) - Signed by Nicholas Lose, MD on 11/01/2018   11/11/2018 - 12/20/2018 Neo-Adjuvant Chemotherapy   Neoadjuvant chemotherapy with dose dense Adriamycin and Cytoxan every 2 weeks x4 followed by Taxol weekly x12 (stopped after cycle 3 due to severe neuropathy)   02/01/2019 Breast MRI   Known malignancy in left breast measures 4.7 x 3.5 x 7.2cm, previously 5 x 3 x 10cm.    02/22/2019 Surgery   Left lumpectomy ([facility]): IDC with DCIS, 5.8cm, HER2 negative, ER 90%, PR 90%, Ki67 2%, clear margins,3/5 LN positive for malignancy.    03/02/2019 Cancer Staging   Staging form: Breast, AJCC 8th Edition - Pathologic stage from 03/02/2019: No Stage Recommended (ypT3, pN1a, cM0, G2, ER+, PR+, HER2-) - Signed by Nicholas Lose, MD on 03/02/2019   03/11/2019 Surgery   Left axillary lymph node dissection ([facility]): metastatic carcinoma in 3/14 lymph nodes,  focal extranodal involvement by tumor.   04/20/2019 - 06/02/2019 Radiation Therapy   Adjuvant radiation therapy   05/30/2019 -  Anti-estrogen oral therapy   Anastrozole 1 mg daily     CHIEF COMPLIANT: Follow-up of left breast cancer on anastrozole  INTERVAL HISTORY: [name] is a 61 y.o. female with above-mentioned history of breast cancer treated with neoadjuvant chemotherapy, lumpectomy and axillary lymph node dissection, radiation, and who is currently on anti-estrogen therapy with anastrozole.She presents to the clinic via phone.  She feels well without any problems or concerns.  She is mainly connecting by phone to discuss results of the CT scans.  She tells me that she has an appointment with the vascular clinic about her discoloration around the ankle.  ALLERGIES:  has No Known Allergies.  MEDICATIONS:  Current Outpatient Medications  Medication Sig Dispense Refill   anastrozole (ARIMIDEX) 1 MG tablet Take 1 tablet (1 mg total) by mouth daily. 90 tablet 3   aspirin EC 81 MG tablet Take 1 tablet (81 mg total) by mouth daily. 90 tablet 3   atorvastatin (LIPITOR) 10 MG tablet Take 1 tablet by mouth daily.     carvedilol (COREG) 6.25 MG tablet TAKE 1 TABLET BY MOUTH TWICE DAILY (Patient taking differently: Take 6.25 mg by mouth 2 (two) times daily with a meal.) 180 tablet 1   cephALEXin (KEFLEX) 500 MG capsule Take 500 mg by mouth 4 (four) times daily.     lisinopril-hydrochlorothiazide (ZESTORETIC) 20-25 MG tablet Take 1 tablet by mouth daily.     meloxicam (MOBIC) 15 MG tablet Take 15 mg by mouth daily as  needed.     methocarbamol (ROBAXIN) 500 MG tablet Take 1,000 mg by mouth 4 (four) times daily as needed.     naproxen (NAPROSYN) 500 MG tablet Take 1 tablet (500 mg total) by mouth 2 (two) times daily with a meal. 60 tablet 3   omeprazole (PRILOSEC) 20 MG capsule TAKE 1 CAPSULE BY MOUTH EVERY DAY 90 capsule 3   ondansetron (ZOFRAN) 4 MG tablet Take 1 tablet (4 mg total)  by mouth as directed. Take one Zofran pill 30-60 minutes before each colonoscopy prep dose 2 tablet 0   Oxycodone HCl 10 MG TABS Take by mouth.     pantoprazole (PROTONIX) 40 MG tablet Take 1 tablet by mouth daily.     pregabalin (LYRICA) 50 MG capsule TAKE 1 CAPSULE BY MOUTH THREE TIMES A DAY 90 capsule 3   Current Facility-Administered Medications  Medication Dose Route Frequency Provider Last Rate Last Admin   0.9 %  sodium chloride infusion  500 mL Intravenous Once Cirigliano, Vito V, DO       Facility-Administered Medications Ordered in Other Visits  Medication Dose Route Frequency Provider Last Rate Last Admin   gadopentetate dimeglumine (MAGNEVIST) injection 20 mL  20 mL Intravenous Once PRN Melvenia Beam, MD       gadopentetate dimeglumine (MAGNEVIST) injection 20 mL  20 mL Intravenous Once PRN Melvenia Beam, MD        PHYSICAL EXAMINATION: ECOG PERFORMANCE STATUS: 1 - Symptomatic but completely ambulatory  There were no vitals filed for this visit. There were no vitals filed for this visit.    LABORATORY DATA:  I have reviewed the data as listed    Latest Ref Rng & Units 09/06/2021    7:54 AM 01/30/2021    8:18 AM 09/27/2020    8:16 AM  CMP  Glucose 70 - 99 mg/dL  101  99   BUN 6 - 20 mg/dL  16  15   Creatinine 0.44 - 1.00 mg/dL 1.00  0.98  1.02   Sodium 135 - 145 mmol/L  143  141   Potassium 3.5 - 5.1 mmol/L  3.9  3.8   Chloride 98 - 111 mmol/L  105  106   CO2 22 - 32 mmol/L  30  29   Calcium 8.9 - 10.3 mg/dL  9.4  9.8   Total Protein 6.5 - 8.1 g/dL  6.9  7.2   Total Bilirubin 0.3 - 1.2 mg/dL  0.4  0.5   Alkaline Phos 38 - 126 U/L  85  90   AST 15 - 41 U/L  16  17   ALT 0 - 44 U/L  15  16     Lab Results  Component Value Date   WBC 3.9 (L) 01/30/2021   HGB 12.0 01/30/2021   HCT 37.7 01/30/2021   MCV 87.5 01/30/2021   PLT 158 01/30/2021   NEUTROABS 2.4 01/30/2021    ASSESSMENT & PLAN:  Malignant neoplasm of upper-outer quadrant of left breast in  female, estrogen receptor positive ([place]) 10/22/2018: Screening detected left breast architectural distortion: 4.5 cm, UOQ, by ultrasound measured 5 cm at 1 o'clock position multiple enlarged lymph nodes, biopsy revealed grade 1 IDC with DCIS with lymphovascular invasion, lymph node biopsy positive, intramammary lymph node biopsy negative, ER 100%, PR 100%, Ki-67 20%, HER-2 1+ by IHC, T2 and 1 stage Ib 11/01/2018: MRI biopsy left breast IDC grade 1, ER PR positive HER-2 negative   Left lumpectomy: 02/22/2019:([facility]): IDC with DCIS,  5.8cm, margins negative, lymphovascular invasion present HER2 negative, ER 90%, PR 90%, Ki67 2%, clear margins,3/5 LN positive for malignancy.  [facility] L&D 03/11/2019: 3/14 lymph nodes positive with focal extranodal involvement [id number]   Treatment plan: 1.  Neoadjuvant chemotherapy with dose dense Adriamycin and Cytoxan every 2 weeks x4 followed by Taxol weekly x 3 discontinued due to severe peripheral neuropathy completed 01/20/2019 2.  Followed by mastectomy on 02/22/2019 3.  Followed by radiation 04/20/2019-06/02/2019 4.  Followed by antiestrogen therapy. 06/02/2019 ----------------------------------------------------------------------------------------------------------------------------------------------------- Current treatment: Anastrozole started 06/02/2019 Anastrozole toxicities: Hot flashes (night sweats)   Lung nodules: Repeat CT scan 12/19/2019: Stable subcentimeter lung nodules bilaterally suggesting benign etiology CT 06/27/2020: Bilateral lower lobe pulmonary nodules.  Overall impression is stable nodules (some mild increase from 3 to 5 mm and 4 to 5 mm and 5 to 7 mm 01/31/21: CT chest: Stable tiny lung nodules Pulm HTN. 09/06/21: CT Chest:  Stable lung nodules favored benign    Breast cancer surveillance: Mammogram 04/17/2022: Benign breast density category A Bone density 10/25/2019: T score -0.9: Normal Liver MRI 10/13/2019: No findings of metastatic disease.   Indeterminate right hepatic lobe lesion favored to be a cyst Breast exam 07/18/2022: Benign   Abdominal pain and upper back pain: 07/15/2022: Stable examination.  No new or progressive findings to suggest metastatic disease.  Stable small pulmonary nodules.  Subtle lesion inferior right lobe of the liver cyst   Discussed the CT scan findings which did not show any evidence of breast cancer. Return to clinic in 1 year for follow-up    No orders of the defined types were placed in this encounter.  The patient has a good understanding of the overall plan. she agrees with it. she will call with any problems that may develop before the next visit here. Total time spent: 30 mins including face to face time and time spent for planning, charting and co-ordination of care   Harriette Ohara, MD 07/18/22    I Gardiner Coins am scribing for Dr. Lindi Adie  I have reviewed the above documentation for accuracy and completeness, and I agree with the above.

## 2022-07-17 NOTE — Assessment & Plan Note (Signed)
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 AX L&D 03/11/2019: 3/14 lymph nodes positive with focal extranodal [id number]  Treatment plan: 1.Neoadjuvant chemotherapy with dose dense Adriamycin and Cytoxan every 2 weeks x4 followed by Taxol weekly x3discontinued due to severe peripheral neuropathycompleted 01/20/2019 2.Followed by [facility] 02/22/2019 3.Followed by radiation7/05/2019-06/02/2019 4.Followed by antiestrogen therapy.06/02/2019 ----------------------------------------------------------------------------------------------------------------------------------------------------- Current treatment: Anastrozole started 06/02/2019 Anastrozole toxicities:Hot flashes (night sweats)  Lung nodules: Repeat CT scan 12/19/2019: Stable subcentimeter lung nodules bilaterally suggesting benign etiology CT 06/27/2020: Bilateral lower lobe pulmonary nodules. Overall impression is stable nodules (some mild increase from 3 to 5 mm and 4 to 5 mm and 5 to 7 mm 01/31/21: CT chest: Stable tiny lung nodules Pulm HTN. 09/06/21: CT Chest: Stable lung nodules favored benign  Breast cancer surveillance: Mammogram7/03/2022: Benign breast density categoryA Bone density 10/25/2019: T score -0.9: Normal Liver MRI 10/13/2019: No findings of metastatic disease. Indeterminate right hepatic lobe lesion favored to  be a cyst Breast exam 07/18/2022: Benign  Abdominal pain and upper back pain: 07/15/2022: Stable examination.  No new or progressive findings to suggest metastatic disease.  Stable small pulmonary nodules.  Subtle lesion inferior right lobe of the liver cyst  Discussed the CT scan findings which did not show any evidence of breast cancer. Return to clinic in 1 year for follow-up

## 2022-07-18 ENCOUNTER — Encounter: Payer: Self-pay | Admitting: Hematology and Oncology

## 2022-07-18 ENCOUNTER — Encounter: Payer: Self-pay | Admitting: Podiatry

## 2022-07-18 ENCOUNTER — Inpatient Hospital Stay: Payer: BC Managed Care – PPO | Attending: Hematology and Oncology | Admitting: Hematology and Oncology

## 2022-07-18 ENCOUNTER — Ambulatory Visit (INDEPENDENT_AMBULATORY_CARE_PROVIDER_SITE_OTHER): Payer: BC Managed Care – PPO | Admitting: Podiatry

## 2022-07-18 DIAGNOSIS — T451X5A Adverse effect of antineoplastic and immunosuppressive drugs, initial encounter: Secondary | ICD-10-CM

## 2022-07-18 DIAGNOSIS — M79675 Pain in left toe(s): Secondary | ICD-10-CM | POA: Diagnosis not present

## 2022-07-18 DIAGNOSIS — B351 Tinea unguium: Secondary | ICD-10-CM

## 2022-07-18 DIAGNOSIS — G620 Drug-induced polyneuropathy: Secondary | ICD-10-CM

## 2022-07-18 DIAGNOSIS — C50412 Malignant neoplasm of upper-outer quadrant of left female breast: Secondary | ICD-10-CM

## 2022-07-18 DIAGNOSIS — Z170 Estrogen receptor positive status [ER+]: Secondary | ICD-10-CM

## 2022-07-18 DIAGNOSIS — M79674 Pain in right toe(s): Secondary | ICD-10-CM | POA: Diagnosis not present

## 2022-07-18 NOTE — Progress Notes (Signed)
  Subjective:  Patient ID: [name], female    DOB: September 18, 1961,  MRN: [id number]  Shahida Schnackenberg [name] presents to clinic today for:  Chief Complaint  Patient presents with   Nail Problem    Routine foot care PCP-[place] PCP VST-04/2022    New problem(s): None.   PCP is [name], NP , and last visit was  April 18, 2022.  No Known Allergies  Review of Systems: Negative except as noted in the HPI.  Objective: No changes noted in today's physical examination.  [name] is a pleasant 61 y.o. female in NAD. AAO x 3. Vascular Examination: Vascular status intact b/l with palpable pedal pulses. Pedal hair present b/l. CFT immediate b/l. No edema. No pain with calf compression b/l. Skin temperature gradient WNL b/l. No cyanosis or clubbing noted b/l LE.  Neurological Examination: Sensation grossly intact b/l with 10 gram monofilament. Vibratory sensation intact b/l. Pt has subjective symptoms of neuropathy.  Dermatological Examination: Pedal skin with normal turgor, texture and tone b/l.  No open wounds b/l LE. No interdigital macerations noted b/l LE. Toenails 1-5 b/l elongated, discolored, dystrophic, thickened, crumbly with subungual debris and tenderness to dorsal palpation. No hyperkeratotic nor porokeratotic lesions present on today's visit.  Musculoskeletal Examination: Normal muscle strength 5/5 to all lower extremity muscle groups bilaterally. No pain, crepitus or joint limitation noted with ROM b/l LE. No gross bony pedal deformities b/l. Patient ambulates independently without assistive aids.  Assessment/Plan: 1. Pain due to onychomycosis of toenails of both feet   2. Chemotherapy-induced neuropathy (H[facility])     No orders of the defined types were placed in this encounter.   -Consent given for treatment as described below: -Examined patient. -Patient to continue soft, supportive shoe gear daily. -Toenails 1-5 b/l were debrided in length  and girth with sterile nail nippers and dremel without iatrogenic bleeding.  -Patient/POA to call should there be question/concern in the interim.   Return in about 3 months (around 10/18/2022).  Marzetta Board, DPM

## 2022-07-22 DIAGNOSIS — M25632 Stiffness of left wrist, not elsewhere classified: Secondary | ICD-10-CM | POA: Insufficient documentation

## 2022-07-28 ENCOUNTER — Encounter (HOSPITAL_COMMUNITY): Payer: BC Managed Care – PPO

## 2022-07-29 ENCOUNTER — Telehealth: Payer: BC Managed Care – PPO | Admitting: Hematology and Oncology

## 2022-07-30 ENCOUNTER — Ambulatory Visit: Payer: BC Managed Care – PPO

## 2022-07-30 ENCOUNTER — Ambulatory Visit (HOSPITAL_COMMUNITY)
Admission: RE | Admit: 2022-07-30 | Discharge: 2022-07-30 | Disposition: A | Discharge status: Home / Self Care | Payer: BC Managed Care – PPO | Source: Ambulatory Visit | Attending: Podiatry | Admitting: Podiatry

## 2022-07-30 ENCOUNTER — Encounter: Payer: Self-pay | Admitting: Cardiology

## 2022-07-30 VITALS — BP 91/70 | HR 101 | Temp 98.5°F | Resp 16 | Ht 74.0 in | Wt 291.0 lb

## 2022-07-30 DIAGNOSIS — I10 Essential (primary) hypertension: Secondary | ICD-10-CM

## 2022-07-30 DIAGNOSIS — I872 Venous insufficiency (chronic) (peripheral): Secondary | ICD-10-CM

## 2022-07-30 NOTE — Progress Notes (Signed)
Primary Physician/Referring:  [name], NP  Patient ID: [name], female    DOB: 1960-10-24, 61 y.o.   MRN: [id number]  Chief Complaint  Patient presents with   Hypertension   Hyperlipidemia   Follow-up    1 year   HPI:    [name]  is a 61 y.o. African American female with a history of Left Breast Cancer SP lumpectomy followed by radiation therapy and chemotherapy, diagnosis in January 2020, GERD and hypertension.    She was last seen in office by Dr. Einar Gip on 03/19/2020. She is here today for follow-up. She is doing well without any complaints. She has been seen by podiatry for left foot pain and swelling and was referred for vascular ultrasound. She has appointment with [facility] tomorrow. She denies chest pain, shortness of breath, palpitations, orthopnea, PND, TIA/syncope.  Past Medical History:  Diagnosis Date   Complication of anesthesia    PONV   Fatigue    GERD (gastroesophageal reflux disease)    w nonobstructing esophageal stricture   Headache    Heat intolerance    History of ETT 03/2009   myoview EF 65%, normal wall motion, no ischemia or infarction, poor exercise capacity   HTN (hypertension)    left breast ca dx'd 10/2018   breast- left   Motion sickness    Normal echocardiogram 03/2009   EF 93-73% moderate diastolic dysfunction, mild LAE   Numbness and tingling    Obesity    PONV (postoperative nausea and vomiting)    S/P partial hysterectomy    Past Surgical History:  Procedure Laterality Date   ABDOMINAL HYSTERECTOMY  2007   partial    AXILLARY LYMPH NODE DISSECTION Left 03/11/2019   Procedure: LEFT AXILLARY LYMPH NODE DISSECTION;  Surgeon: Erroll Luna, MD;  Location: [place];  Service: General;  Laterality: Left;   BREAST LUMPECTOMY WITH RADIOACTIVE SEED AND SENTINEL LYMPH NODE BIOPSY Left 02/22/2019   Procedure: LEFT BREAST LUMPECTOMY x2 WITH RADIOACTIVE SEED AND LEFT AXILLA SEED   GUIDED LYMPH NODE BIOPSY AND LEFT SENTINEL LYMPH NODE [place];  Surgeon: Erroll Luna, MD;  Location: [place];  Service: General;  Laterality: Left;   [place] Bilateral 2018   FOOT SURGERY Bilateral 2016   hysterectomy (other)     PORT-A-CATH REMOVAL N/A 02/22/2019   Procedure: REMOVAL PORT-A-CATH;  Surgeon: Erroll Luna, MD;  Location: [place];  Service: General;  Laterality: N/A;   PORTACATH PLACEMENT N/A 11/10/2018   Procedure: INSERTION PORT-A-CATH WITH ULTRASOUND;  Surgeon: Erroll Luna, MD;  [facility];  Service: General;  Laterality: N/A;   TONSILLECTOMY  1968   UPPER GASTROINTESTINAL ENDOSCOPY     Family History  Problem Relation Age of Onset   Heart attack Mother 15   Heart disease Mother    Diabetes Sister    Heart attack Maternal Grandmother        age 54 or 31   Heart disease Maternal Grandmother    Neuropathy Neg Hx    Colon cancer Neg Hx    Colon polyps Neg Hx    Esophageal cancer Neg Hx    Stomach cancer Neg Hx    Rectal cancer Neg Hx    Social History   Tobacco Use   Smoking status: Never   Smokeless tobacco: Never  Substance Use Topics   Alcohol use: No   Marital Status: Married  ROS  Review of Systems  Constitutional:  Negative for malaise/fatigue.  Cardiovascular:  Negative for chest pain, dyspnea on exertion, leg swelling, palpitations and syncope.  Respiratory:  Negative for shortness of breath.   Gastrointestinal:  Negative for melena.   Objective  Resp. rate 16, height '6\' 2"'$  (1.88 m).     07/30/2022    3:01 PM 06/23/2022    2:02 PM 01/28/2022   12:49 PM  Vitals with BMI  Height '6\' 2"'$  '6\' 2"'$    Weight  283 lbs 10 oz   BMI  01.6   Systolic  010 932  Diastolic  85 70  Pulse  79 64     Physical Exam Constitutional:      General: She is not in acute distress.    Appearance: She is well-developed.     Comments: Moderately obese  Cardiovascular:     Rate and Rhythm: Normal rate  and regular rhythm.     Pulses: Intact distal pulses.     Heart sounds: Normal heart sounds. No murmur heard.    No gallop.     Comments: No leg edema, no JVD. Pulmonary:     Effort: Pulmonary effort is normal.     Breath sounds: Normal breath sounds.  Abdominal:     General: Bowel sounds are normal.     Palpations: Abdomen is soft.  Musculoskeletal:     Right lower leg: No edema.     Left lower leg: Edema present.    Laboratory examination:   Recent Labs    09/06/21 0754  CREATININE 1.00   CrCl cannot be calculated (Patient's most recent lab result is older than the maximum 21 days allowed.).     Latest Ref Rng & Units 09/06/2021    7:54 AM 01/30/2021    8:18 AM 09/27/2020    8:16 AM  CMP  Glucose 70 - 99 mg/dL  101  99   BUN 6 - 20 mg/dL  16  15   Creatinine 0.44 - 1.00 mg/dL 1.00  0.98  1.02   Sodium 135 - 145 mmol/L  143  141   Potassium 3.5 - 5.1 mmol/L  3.9  3.8   Chloride 98 - 111 mmol/L  105  106   CO2 22 - 32 mmol/L  30  29   Calcium 8.9 - 10.3 mg/dL  9.4  9.8   Total Protein 6.5 - 8.1 g/dL  6.9  7.2   Total Bilirubin 0.3 - 1.2 mg/dL  0.4  0.5   Alkaline Phos 38 - 126 U/L  85  90   AST 15 - 41 U/L  16  17   ALT 0 - 44 U/L  15  16       Latest Ref Rng & Units 01/30/2021    8:18 AM 09/27/2020    8:16 AM 06/26/2020    3:17 PM  CBC  WBC 4.0 - 10.5 K/uL 3.9  4.2  4.2   Hemoglobin 12.0 - 15.0 g/dL 12.0  12.1  11.6   Hematocrit 36.0 - 46.0 % 37.7  38.3  35.9   Platelets 150 - 400 K/uL 158  160  159    Lipid Panel     Component Value Date/Time   CHOL 141 06/26/2020 1539   CHOL 193 02/02/2020 1121   TRIG 91 06/26/2020 1539   HDL 42 06/26/2020 1539   HDL 49 02/02/2020 1121   CHOLHDL 3.4 06/26/2020 1539   VLDL 18 06/26/2020 1539   LDLCALC 81 06/26/2020 1539   LDLCALC 131 (H)  02/02/2020 1121   HEMOGLOBIN A1C Lab Results  Component Value Date   HGBA1C 5.7 (H) 05/13/2017   TSH No results for input(s): "TSH" in the last 8760 hours.  External labs :    Medications and allergies  No Known Allergies   Current Outpatient Medications  Medication Instructions   anastrozole (ARIMIDEX) 1 mg, Oral, Daily   aspirin EC 81 mg, Oral, Daily   atorvastatin (LIPITOR) 10 MG tablet 1 tablet, Oral, Daily   carvedilol (COREG) 6.25 MG tablet TAKE 1 TABLET BY MOUTH TWICE DAILY   cephALEXin (KEFLEX) 500 mg, Oral, 4 times daily   lisinopril-hydrochlorothiazide (ZESTORETIC) 20-25 MG tablet 1 tablet, Oral, Daily   meloxicam (MOBIC) 15 mg, Oral, Daily PRN   methocarbamol (ROBAXIN) 1,000 mg, Oral, 4 times daily PRN   naproxen (NAPROSYN) 500 mg, Oral, 2 times daily with meals   omeprazole (PRILOSEC) 20 MG capsule TAKE 1 CAPSULE BY MOUTH EVERY DAY   ondansetron (ZOFRAN) 4 mg, Oral, As directed, Take one Zofran pill 30-60 minutes before each colonoscopy prep dose   Oxycodone HCl 10 MG TABS Oral   pantoprazole (PROTONIX) 40 MG tablet 1 tablet, Oral, Daily   pregabalin (LYRICA) 50 MG capsule TAKE 1 CAPSULE BY MOUTH THREE TIMES A DAY   There are no discontinued medications.   Radiology:   CT Chest, Abdomen, Pelvis W Contrast 10/11/2019: Cardiovascular: Bovine arch. Aortic atherosclerosis. Mild cardiomegaly, without pericardial effusion. No central pulmonary embolism, on this non-dedicated study. IMPRESSION: 1. Primarily right-sided pulmonary nodules. Although these are subpleural in distribution and could represent subpleural lymph nodes, given size, pulmonary metastasis are a concern. These are below PET resolution. Consider CT follow-up at 3-6 months. 2. Indeterminate right hepatic lobe lesion is too small to characterize at CT. Consider further evaluation with pre and post contrast abdominal MRI 3. Otherwise, no evidence of metastatic disease. 4. Small hiatal hernia. 5.  Aortic Atherosclerosis (ICD10-I70.0). 6. Equivocal bladder wall edema, at least partially felt to be due to underdistention. Consider correlation with urinalysis.  CT Chest W Contrast  12/19/2019: Cardiovascular:  No acute findings. IMPRESSION: Stable sub-cm pulmonary nodules, suggesting benign etiology. Given patient's history of cancer, continued follow-up by chest CT is recommended in 6 months. Stable small hiatal hernia.  Cardiac Studies:   Transthoracic Echocardiography 11/05/2018: - Left ventricle: The cavity size was normal. Wall thickness was normal. Systolic function was normal.  The estimated ejection fraction was in the range of 55% to 60%. Wall motion was normal; there were no regional wall motion abnormalities. Left    ventricular diastolic function parameters were normal.  - Left atrium: The atrium was mildly dilated.  - Atrial septum: No defect or patent foramen ovale was identified.  - Impressions: Low normal GLS -16.1.   Echocardiogram 02/22/2020:  Left ventricle cavity is normal in size. Mild concentric hypertrophy of the left ventricle. Normal global wall motion.  Normal LV systolic function with EF 56%. Normal diastolic filling pattern.  No significant valvular abnormality. Normal right atrial pressure.   Lexiscan/modified Bruce Sestamibi stress test 02/20/2020: No previous exam available for comparison. Lexiscan/modified Bruce nuclear stress test performed using 1-day protocol. Stress EKG is non-diagnostic, as this is pharmacological stress test. Additionally, stress EKG at 84% MPHR showed sinus tachycardia, no ischemic changes.  Normal stress myocardial perfusion. Decreased tracer uptake in apical inferior myocardium likely due to tissue attenuation artifact. Stress LVEF 50%. Low risk study.   Left lower extremity Vascular Ultrasound 07/30/22: Summary:  Left:  - No evidence of  deep vein thrombosis seen in the left lower extremity,  from the common femoral through the popliteal veins.  - Venous reflux is noted in the left common femoral vein.  - Venous reflux is noted in the left sapheno-femoral junction.  - Venous reflux is noted in the left  greater saphenous vein in the thigh.  - Venous reflux is noted in the left femoral vein.  - Venous reflux is noted in the left popliteal vein.  - Venous reflux is noted in the left short saphenous vein.  - No evidence of venous reflux in the AASV.   EKG   EKG 07/30/2022: Sinus rhythm rate 90 bpm.  Normal axis.  No evidence of ischemia.  Normal EKG.  Compared to EKG on 01/31/2020, no significant change.  Assessment     ICD-10-CM   1. Essential hypertension  I10 EKG 12-Lead       Recommendations:   Essential hypertension Blood pressure is soft today. She does not check her BP at home but does not that it has been 110/70 at recent doctor appointments. Given this, will stop lisinopril-HCTZ. Advised pt to check BP at home and call if readings are elevated >140/90. She continues on carvedilol and atorvastatin without side effects. Discussed importance of low sodium diet less than '2000mg'$  daily and exercise.  Lipid panel and CMP ordered.  Overall, she is stable from a cardiac standpoint.  Follow-up in 1 year or sooner if needed.   Ernst Spell, AGNP-C 07/30/2022, 3:07 PM [facility] Pager: [phone] Office: [phone]

## 2022-07-31 ENCOUNTER — Ambulatory Visit: Payer: BC Managed Care – PPO | Admitting: Physician Assistant

## 2022-07-31 VITALS — BP 112/80 | HR 85 | Temp 97.8°F | Resp 20 | Ht 74.0 in | Wt 285.5 lb

## 2022-07-31 DIAGNOSIS — M7989 Other specified soft tissue disorders: Secondary | ICD-10-CM | POA: Diagnosis not present

## 2022-07-31 NOTE — Progress Notes (Signed)
[facility]           OF [place]  History and Physical   ZAYDEN HAHNE is a 61 y.o. female who presents with left leg swelling.  She does not have any hx of DVT but there is family hx of clotting.  She does have family hx of leg swelling but states it is most likely cardiac in nature.  She has worn thigh high compression and states it does help sometimes.  She does have some skin color changes on bilateral lower legs. She states that her swelling is better most mornings after lying flat overnight.  She states that her legs are achy after being up on them.    She has hx of left sided breast cancer.  She has hx of left foot surgery in the past and needs more surgery on the left and her doctor wanted her evaluated prior to having surgery.      The pt is on a statin for cholesterol management.  The pt is on a daily aspirin.   Other AC:  none The pt is on BB for hypertension.   The pt is not diabetic.   Tobacco hx:  never  Pt does have have family hx of AAA with her grandfather.  She did have a CT abdomen and pelvis this year and there was no evidence of AAA.  She is retired from [employer].    Past Medical History:  Diagnosis Date   Complication of anesthesia    PONV   Fatigue    GERD (gastroesophageal reflux disease)    w nonobstructing esophageal stricture   Headache    Heat intolerance    History of ETT 03/2009   myoview EF 65%, normal wall motion, no ischemia or infarction, poor exercise capacity   HTN (hypertension)    left breast ca dx'd 10/2018   breast- left   Motion sickness    Normal echocardiogram 03/2009   EF 67-34% moderate diastolic dysfunction, mild LAE   Numbness and tingling    Obesity    PONV (postoperative nausea and vomiting)    S/P partial hysterectomy     Past Surgical History:  Procedure Laterality Date   ABDOMINAL HYSTERECTOMY  2007   partial    AXILLARY LYMPH NODE DISSECTION Left 03/11/2019    Procedure: LEFT AXILLARY LYMPH NODE DISSECTION;  Surgeon: Erroll Luna, MD;  Location: [place];  Service: General;  Laterality: Left;   BREAST LUMPECTOMY WITH RADIOACTIVE SEED AND SENTINEL LYMPH NODE BIOPSY Left 02/22/2019   Procedure: LEFT BREAST LUMPECTOMY x2 WITH RADIOACTIVE SEED AND LEFT AXILLA SEED  GUIDED LYMPH NODE BIOPSY AND LEFT SENTINEL LYMPH NODE [place];  Surgeon: Erroll Luna, MD;  Location: [place];  Service: General;  Laterality: Left;   CARPAL TUNNEL RELEASE Bilateral 2018   FOOT SURGERY Bilateral 2016   hysterectomy (other)     PORT-A-CATH REMOVAL N/A 02/22/2019   Procedure: REMOVAL PORT-A-CATH;  Surgeon: Erroll Luna, MD;  Location: [place];  Service: General;  Laterality: N/A;   PORTACATH PLACEMENT N/A 11/10/2018   Procedure: INSERTION PORT-A-CATH WITH ULTRASOUND;  Surgeon: Erroll Luna, MD;  [facility];  Service: General;  Laterality: N/A;   TONSILLECTOMY  1968   UPPER GASTROINTESTINAL ENDOSCOPY      Social History   Socioeconomic History   Marital status: Married    Spouse name: Not on file   Number of children:  2   Years of education: Not on file   Highest education level: Not on file  Occupational History   Occupation: teacher  Tobacco Use   Smoking status: Never   Smokeless tobacco: Never  Vaping Use   Vaping Use: Never used  Substance and Sexual Activity   Alcohol use: No   Drug use: No   Sexual activity: Yes    Birth control/protection: Surgical  Other Topics Concern   Not on file  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 w/ her family   Right-handed   Caffeine: 1/2 cup coffee during the school year, tea   Social Determinants of Health   Financial Resource Strain: Not on file  Food Insecurity: Not on file  Transportation Needs: Not on file  Physical Activity: Not on file  Stress: Not on file  Social Connections: Not on file  Intimate Partner Violence: Not on  file     Family History  Problem Relation Age of Onset   Heart attack Mother 59   Heart disease Mother    Diabetes Sister    Heart attack Maternal Grandmother        age 31 or 55   Heart disease Maternal Grandmother    Neuropathy Neg Hx    Colon cancer Neg Hx    Colon polyps Neg Hx    Esophageal cancer Neg Hx    Stomach cancer Neg Hx    Rectal cancer Neg Hx     Current Outpatient Medications  Medication Sig Dispense Refill   anastrozole (ARIMIDEX) 1 MG tablet Take 1 tablet (1 mg total) by mouth daily. 90 tablet 3   aspirin EC 81 MG tablet Take 1 tablet (81 mg total) by mouth daily. 90 tablet 3   atorvastatin (LIPITOR) 10 MG tablet Take 1 tablet by mouth daily.     carvedilol (COREG) 6.25 MG tablet TAKE 1 TABLET BY MOUTH TWICE DAILY (Patient taking differently: Take 6.25 mg by mouth 2 (two) times daily with a meal.) 180 tablet 1   cephALEXin (KEFLEX) 500 MG capsule Take 500 mg by mouth 4 (four) times daily.     meloxicam (MOBIC) 15 MG tablet Take 15 mg by mouth daily as needed.     methocarbamol (ROBAXIN) 500 MG tablet Take 1,000 mg by mouth 4 (four) times daily as needed.     naproxen (NAPROSYN) 500 MG tablet Take 1 tablet (500 mg total) by mouth 2 (two) times daily with a meal. 60 tablet 3   omeprazole (PRILOSEC) 20 MG capsule TAKE 1 CAPSULE BY MOUTH EVERY DAY 90 capsule 3   pantoprazole (PROTONIX) 40 MG tablet Take 1 tablet by mouth daily.     pregabalin (LYRICA) 50 MG capsule TAKE 1 CAPSULE BY MOUTH THREE TIMES A DAY 90 capsule 3   No current facility-administered medications for this visit.   Facility-Administered Medications Ordered in Other Visits  Medication Dose Route Frequency Provider Last Rate Last Admin   gadopentetate dimeglumine (MAGNEVIST) injection 20 mL  20 mL Intravenous Once PRN Melvenia Beam, MD       gadopentetate dimeglumine (MAGNEVIST) injection 20 mL  20 mL Intravenous Once PRN Melvenia Beam, MD        No Known Allergies  REVIEW OF SYSTEMS:    '[X]'$  denotes positive finding, '[ ]'$  denotes negative finding Cardiac  Comments:  Chest pain or chest pressure:    Shortness of breath upon exertion:    Short of breath when lying  flat:    Irregular heart rhythm:        Vascular    Pain in calf, thigh, or hip brought on by ambulation:    Pain in feet at night that wakes you up from your sleep:     Blood clot in your veins:    Leg swelling:  x       Pulmonary    Oxygen at home:    Productive cough:     Wheezing:         Neurologic    Sudden weakness in arms or legs:     Sudden numbness in arms or legs:     Sudden onset of difficulty speaking or slurred speech:    Temporary loss of vision in one eye:     Problems with dizziness:         Gastrointestinal    Blood in stool:     Vomited blood:         Genitourinary    Burning when urinating:     Blood in urine:        Psychiatric    Major depression:         Hematologic    Bleeding problems:    Problems with blood clotting too easily:        Skin    Rashes or ulcers:        Constitutional    Fever or chills:      PHYSICAL EXAMINATION:  Today's Vitals   07/31/22 0938  BP: 112/80  Pulse: 85  Resp: 20  Temp: 97.8 F (36.6 C)  TempSrc: Temporal  SpO2: 99%  Weight: 285 lb 8 oz (129.5 kg)  Height: '6\' 2"'$  (1.88 m)  PainSc: 8    Body mass index is 36.66 kg/m.   General:  WDWN in NAD; vital signs documented above Gait: Not observed HENT: WNL, normocephalic Pulmonary: normal non-labored breathing without wheezing Cardiac: regular HR; without carotid bruits Abdomen: soft, NT, aortic pulse is not palpable Skin: without rashes Vascular Exam/Pulses:  Right Left  Radial 2+ (normal) Not palpated; brace in place  DP 2+ (normal) 2+ (normal)  PT 2+ (normal) 2+ (normal)   Extremities: hemosiderin staining BLE; mild swelling left leg; well healed scar on dorsum of left foot.  Neurologic: A&O X 3;  moving all extremities equally Psychiatric:  The pt has Normal  affect.   Non-Invasive Vascular Imaging:   Venous duplex on 07/30/2022: +--------------+---------+------+-----------+------------+--------+  LEFT         Reflux NoRefluxReflux TimeDiameter cmsComments                          Yes                                   +--------------+---------+------+-----------+------------+--------+  CFV                    yes   >1 second                       +--------------+---------+------+-----------+------------+--------+  FV mid                  yes   >1 second                       +--------------+---------+------+-----------+------------+--------+  Popliteal  yes   >1 second                       +--------------+---------+------+-----------+------------+--------+  GSV at [facility]              yes    >500 ms      0.80              +--------------+---------+------+-----------+------------+--------+  GSV prox thigh          yes    >500 ms      0.58              +--------------+---------+------+-----------+------------+--------+  GSV mid thigh           yes    >500 ms      0.44              +--------------+---------+------+-----------+------------+--------+  GSV dist thigh          yes    >500 ms      0.47              +--------------+---------+------+-----------+------------+--------+  GSV at knee             yes    >500 ms      0.48              +--------------+---------+------+-----------+------------+--------+  GSV prox calf no                            0.28              +--------------+---------+------+-----------+------------+--------+  GSV mid calf  no                            0.24              +--------------+---------+------+-----------+------------+--------+  SSV Pop Fossa no                            0.49              +--------------+---------+------+-----------+------------+--------+  SSV prox calf no                            0.40               +--------------+---------+------+-----------+------------+--------+  SSV mid calf            yes    >500 ms      0.25              +--------------+---------+------+-----------+------------+--------+  AASV O        no                            0.35              +--------------+---------+------+-----------+------------+--------+  AASV P        no                            0.27              +--------------+---------+------+-----------+------------+--------+  AASV mid      no  0.31              +--------------+---------+------+-----------+------------+--------+   Summary:  Left:  - No evidence of deep vein thrombosis seen in the left lower extremity,  from the common femoral through the popliteal veins.  - Venous reflux is noted in the left common femoral vein.  - Venous reflux is noted in the left sapheno-femoral junction.  - Venous reflux is noted in the left greater saphenous vein in the thigh.  - Venous reflux is noted in the left femoral vein.  - Venous reflux is noted in the left popliteal vein.  - Venous reflux is noted in the left short saphenous vein.  - No evidence of venous reflux in the AASV.     ELLAH OTTE is a 61 y.o. female who presents with: LLE swelling    -pt has easily palpable pedal pulses bilaterally -pt does not have evidence of DVT.  Pt does have venous reflux in the deep venous system as well as in the GSV at the [facility] and throughout the GSV measuring >70m -discussed with pt about wearing thigh high 20-30 mmHg compression stockings and pt was measured for these today and did get a couple of pairs of stockings.    -discussed the importance of leg elevation and how to elevate properly - pt is advised to elevate their legs and a diagram is given to them to demonstrate for pt to lay flat on their back with knees elevated and slightly bent with their feet higher than their knees, which puts  their feet higher than their heart for 15 minutes per day.  If pt cannot lay flat, advised to lay as flat as possible.  -pt is advised to continue as much walking as possible and avoid sitting or standing for long periods of time.  -discussed importance of weight loss and exercise and that water aerobics would also be beneficial.  -handout with recommendations given -pt will f/u in 3 months with Dr. DScot Dockas she has requested him.   SLeontine Locket PFreder[facility] Hospital[facility] [phone] Clinic MD:  CDonzetta Matters

## 2022-08-19 ENCOUNTER — Ambulatory Visit: Payer: BC Managed Care – PPO | Admitting: Podiatry

## 2022-08-19 ENCOUNTER — Other Ambulatory Visit: Payer: Self-pay | Admitting: *Deleted

## 2022-08-19 ENCOUNTER — Telehealth: Payer: Self-pay | Admitting: *Deleted

## 2022-08-19 DIAGNOSIS — I872 Venous insufficiency (chronic) (peripheral): Secondary | ICD-10-CM

## 2022-08-19 NOTE — Telephone Encounter (Signed)
Faxed referral to [facility]in[facility],consult for venus insufficiency,confirmation received.

## 2022-08-20 ENCOUNTER — Ambulatory Visit: Payer: BC Managed Care – PPO | Admitting: Podiatry

## 2022-10-12 ENCOUNTER — Other Ambulatory Visit: Payer: Self-pay | Admitting: Hematology and Oncology

## 2022-10-12 DIAGNOSIS — K219 Gastro-esophageal reflux disease without esophagitis: Secondary | ICD-10-CM

## 2022-11-06 ENCOUNTER — Encounter: Payer: Self-pay | Admitting: Podiatry

## 2022-11-12 ENCOUNTER — Ambulatory Visit: Payer: BC Managed Care – PPO | Admitting: Vascular Surgery

## 2022-11-12 ENCOUNTER — Encounter: Payer: Self-pay | Admitting: Vascular Surgery

## 2022-11-12 VITALS — BP 135/83 | HR 52 | Temp 98.0°F | Resp 14 | Ht 74.0 in | Wt 281.0 lb

## 2022-11-12 DIAGNOSIS — I872 Venous insufficiency (chronic) (peripheral): Secondary | ICD-10-CM | POA: Diagnosis not present

## 2022-11-12 DIAGNOSIS — M7989 Other specified soft tissue disorders: Secondary | ICD-10-CM | POA: Diagnosis not present

## 2022-11-12 NOTE — Progress Notes (Signed)
REASON FOR VISIT:   Follow-up of chronic venous insufficiency.  MEDICAL ISSUES:   CHRONIC VENOUS INSUFFICIENCY: This patient has CEAP C4 venous disease (hyperpigmentation).  We have again discussed the importance of daily leg elevation and proper positioning for this.  I have encouraged her to continue to wear her compression stockings.  We discussed the importance of exercise specifically walking and water aerobics.  I have encouraged her to avoid prolonged sitting and standing.  We also discussed the importance of maintaining a healthy weight as central obesity especially increases lower extremity venous pressure.  I think she would be a candidate for laser ablation of the left great saphenous vein to help lower her venous pressure.  I have explained however that she also has deep venous reflux so that this will not totally correct her problem.  We discussed the indications for laser ablation and the potential complications including a 1% risk of DVT and 1% risk of failure of the vein to successfully closed.  She will consider this and will let us know if she elects to proceed.  HPI:   [name] is a pleasant 62 y.o. female who was seen by Liana Crocker, PA on 07/31/2022 with left lower extremity swelling.  The patient has worn thigh-high compression stockings which do help some.  The patient describes significant aching pain in the leg which is aggravated by standing and relieved with elevation.  The patient had significant superficial and deep venous reflux on duplex.  She was encouraged to elevate her leg, she was prescribed thigh-high compression stockings with a gradient of 20 to 30 mmHg, and she was encouraged to exercise.  She was set up for 67-monthfollow-up visit.  Of note, she needs surgery on her left foot.  She T2 she [place] [employer] in the is doubling she is she is is her left leg she Currently  On my history, her chief complaint is left leg swelling.  She is also is  concerned about the discoloration of her left leg.  She describes some aching pain and heaviness in the leg also which is aggravated by standing and sitting and relieved with elevation.  She has been wearing her thigh-high compression stockings which help some.  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  Past Medical History:  Diagnosis Date   Complication of anesthesia    PONV   Fatigue    GERD (gastroesophageal reflux disease)    w nonobstructing esophageal stricture   Headache    Heat intolerance    History of ETT 03/2009   myoview EF 65%, normal wall motion, no ischemia or infarction, poor exercise capacity   HTN (hypertension)    left breast ca dx'd 10/2018   breast- left   Motion sickness    Normal echocardiogram 03/2009   EF 631-54%moderate diastolic dysfunction, mild LAE   Numbness and tingling    Obesity    PONV (postoperative nausea and vomiting)    S/P partial hysterectomy     Family History  Problem Relation Age of Onset   Heart attack Mother 462  Heart disease Mother    Diabetes Sister    Heart attack Maternal Grandmother        age [id number]  Heart disease Maternal Grandmother    Neuropathy Neg Hx    Colon cancer Neg Hx    Colon polyps Neg Hx    Esophageal cancer Neg Hx    Stomach  cancer Neg Hx    Rectal cancer Neg Hx     SOCIAL HISTORY: Social History   Tobacco Use   Smoking status: Never    Passive exposure: Never   Smokeless tobacco: Never  Substance Use Topics   Alcohol use: No    No Known Allergies  Current Outpatient Medications  Medication Sig Dispense Refill   anastrozole (ARIMIDEX) 1 MG tablet Take 1 tablet (1 mg total) by mouth daily. 90 tablet 3   atorvastatin (LIPITOR) 10 MG tablet Take 1 tablet by mouth daily.     carvedilol (COREG) 6.25 MG tablet TAKE 1 TABLET BY MOUTH TWICE DAILY (Patient taking differently: Take 6.25 mg by mouth 2 (two) times daily with a meal.) 180 tablet 1   naproxen (NAPROSYN) 500 MG  tablet Take 1 tablet (500 mg total) by mouth 2 (two) times daily with a meal. 60 tablet 3   pantoprazole (PROTONIX) 40 MG tablet TAKE 1 TABLET BY MOUTH EVERY DAY 90 tablet 1   aspirin EC 81 MG tablet Take 1 tablet (81 mg total) by mouth daily. (Patient not taking: Reported on 11/12/2022) 90 tablet 3   cephALEXin (KEFLEX) 500 MG capsule Take 500 mg by mouth 4 (four) times daily. (Patient not taking: Reported on 11/12/2022)     meloxicam (MOBIC) 15 MG tablet Take 15 mg by mouth daily as needed. (Patient not taking: Reported on 11/12/2022)     methocarbamol (ROBAXIN) 500 MG tablet Take 1,000 mg by mouth 4 (four) times daily as needed. (Patient not taking: Reported on 11/12/2022)     omeprazole (PRILOSEC) 20 MG capsule TAKE 1 CAPSULE BY MOUTH EVERY DAY (Patient not taking: Reported on 11/12/2022) 90 capsule 3   pregabalin (LYRICA) 50 MG capsule TAKE 1 CAPSULE BY MOUTH THREE TIMES A DAY (Patient not taking: Reported on 11/12/2022) 90 capsule 3   No current facility-administered medications for this visit.   Facility-Administered Medications Ordered in Other Visits  Medication Dose Route Frequency Provider Last Rate Last Admin   gadopentetate dimeglumine (MAGNEVIST) injection 20 mL  20 mL Intravenous Once PRN Melvenia Beam, MD       gadopentetate dimeglumine (MAGNEVIST) injection 20 mL  20 mL Intravenous Once PRN Melvenia Beam, MD        REVIEW OF SYSTEMS:  '[X]'$  denotes positive finding, '[ ]'$  denotes negative finding Cardiac  Comments:  Chest pain or chest pressure:    Shortness of breath upon exertion:    Short of breath when lying flat:    Irregular heart rhythm:        Vascular    Pain in calf, thigh, or hip brought on by ambulation:    Pain in feet at night that wakes you up from your sleep:     Blood clot in your veins:    Leg swelling:  x       Pulmonary    Oxygen at home:    Productive cough:     Wheezing:         Neurologic    Sudden weakness in arms or legs:     Sudden numbness  in arms or legs:     Sudden onset of difficulty speaking or slurred speech:    Temporary loss of vision in one eye:     Problems with dizziness:         Gastrointestinal    Blood in stool:     Vomited blood:         Genitourinary  Burning when urinating:     Blood in urine:        Psychiatric    Major depression:         Hematologic    Bleeding problems:    Problems with blood clotting too easily:        Skin    Rashes or ulcers:        Constitutional    Fever or chills:     PHYSICAL EXAM:   Vitals:   11/12/22 1542  BP: 135/83  Pulse: (!) 52  Resp: 14  Temp: 98 F (36.7 C)  TempSrc: Temporal  SpO2: 98%  Weight: 281 lb (127.5 kg)  Height: '6\' 2"'$  (1.88 m)   GENERAL: The patient is a well-nourished female, in no acute distress. The vital signs are documented above. CARDIAC: There is a regular rate and rhythm.  VASCULAR: I do not detect carotid bruits. On the right side she has a palpable dorsalis pedis pulse. I cannot palpate pulses in the left foot however she has a biphasic dorsalis pedis and posterior tibial signal with the Doppler.  She has 2 incisions on her left foot from previous surgery. She does have some hyperpigmentation on the left consistent with chronic venous insufficiency.  I did look at her left great saphenous vein myself with the SonoSite.  She has reflux down to her knee and the vein is moderately dilated.  I would likely cannulate the vein in the distal thigh. PULMONARY: There is good air exchange bilaterally without wheezing or rales. ABDOMEN: Soft and non-tender with normal pitched bowel sounds.  MUSCULOSKELETAL: There are no major deformities or cyanosis. NEUROLOGIC: No focal weakness or paresthesias are detected. SKIN: There are no ulcers or rashes noted. PSYCHIATRIC: The patient has a normal affect.  DATA:    VENOUS DUPLEX: I have reviewed the venous duplex scan that was done on 07/30/2022.  This was of the left lower extremity only.   There was no evidence of DVT.  There was deep venous reflux involving the common femoral vein, femoral vein, and popliteal vein.  There was superficial venous reflux in the left great saphenous vein from the saphenofemoral junction to the knee.  Diameters of the vein ranged from 4.5-6 mm.  The results of the study are summarized on the diagram below.    A total of 41 minutes was spent on this visit. 20 minutes was face to face time. More than 50% of the time was spent on counseling and coordinating with the patient.    Deitra Mayo [facility] [phone]

## 2022-11-14 ENCOUNTER — Ambulatory Visit: Payer: BC Managed Care – PPO | Admitting: Podiatry

## 2022-12-20 ENCOUNTER — Other Ambulatory Visit: Payer: Self-pay | Admitting: Hematology and Oncology

## 2022-12-20 DIAGNOSIS — I700 Atherosclerosis of aorta: Secondary | ICD-10-CM

## 2022-12-22 ENCOUNTER — Other Ambulatory Visit: Payer: Self-pay | Admitting: Cardiology

## 2023-03-08 ENCOUNTER — Other Ambulatory Visit: Payer: Self-pay | Admitting: Hematology and Oncology

## 2023-03-08 DIAGNOSIS — K219 Gastro-esophageal reflux disease without esophagitis: Secondary | ICD-10-CM

## 2023-03-10 ENCOUNTER — Other Ambulatory Visit: Payer: Self-pay

## 2023-03-10 DIAGNOSIS — K219 Gastro-esophageal reflux disease without esophagitis: Secondary | ICD-10-CM

## 2023-03-10 MED ORDER — ANASTROZOLE 1 MG PO TABS: 1.0000 mg | ORAL_TABLET | Freq: Every day | ORAL | 3 refills | Status: DC

## 2023-03-10 MED ORDER — PANTOPRAZOLE SODIUM 40 MG PO TBEC: 40.0000 mg | DELAYED_RELEASE_TABLET | Freq: Every day | ORAL | 1 refills | Status: DC

## 2023-04-26 DIAGNOSIS — E785 Hyperlipidemia, unspecified: Secondary | ICD-10-CM | POA: Diagnosis not present

## 2023-04-26 DIAGNOSIS — R109 Unspecified abdominal pain: Secondary | ICD-10-CM | POA: Diagnosis not present

## 2023-04-26 DIAGNOSIS — R600 Localized edema: Secondary | ICD-10-CM | POA: Diagnosis not present

## 2023-04-26 DIAGNOSIS — I10 Essential (primary) hypertension: Secondary | ICD-10-CM | POA: Diagnosis not present

## 2023-05-04 DIAGNOSIS — Z853 Personal history of malignant neoplasm of breast: Secondary | ICD-10-CM | POA: Diagnosis not present

## 2023-05-06 NOTE — Progress Notes (Signed)
Patient Care Team: Elie Confer, NP as PCP - Earma Reading, MD as Consulting Physician (General Surgery) Serena Croissant, MD as Consulting Physician (Hematology and Oncology) Dorothy Puffer, MD as Consulting Physician (Radiation Oncology)  DIAGNOSIS:  Encounter Diagnosis  Name Primary?   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, lymph node biopsy positive, intramammary lymph node biopsy negative, ER 100%, PR 100%, Ki-67 20%, HER-2 1+ by IHC, T2 and 1 stage 2A   10/27/2018 Cancer Staging   Staging form: Breast, AJCC 8th Edition - Clinical: Stage IIA (cT2, cN1(f), cM0, G1, ER+, PR+, HER2-) - Signed by Serena Croissant, MD on 11/01/2018   11/11/2018 - 12/20/2018 Neo-Adjuvant Chemotherapy   Neoadjuvant chemotherapy with dose dense Adriamycin and Cytoxan every 2 weeks x4 followed by Taxol weekly x12 (stopped after cycle 3 due to severe neuropathy)   02/01/2019 Breast MRI   Known malignancy in left breast measures 4.7 x 3.5 x 7.2cm, previously 5 x 3 x 10cm.    02/22/2019 Surgery   Left lumpectomy ([facility]): IDC with DCIS, 5.8cm, HER2 negative, ER 90%, PR 90%, Ki67 2%, clear margins,3/5 LN positive for malignancy.    03/02/2019 Cancer Staging   Staging form: Breast, AJCC 8th Edition - Pathologic stage from 03/02/2019: No Stage Recommended (ypT3, pN1a, cM0, G2, ER+, PR+, HER2-) - Signed by Serena Croissant, MD on 03/02/2019   03/11/2019 Surgery   Left axillary lymph node dissection ([facility]): metastatic carcinoma in 3/14 lymph  nodes, focal extranodal involvement by tumor.   04/20/2019 - 06/02/2019 Radiation Therapy   Adjuvant radiation therapy   05/30/2019 -  Anti-estrogen oral therapy   Anastrozole 1 mg daily     CHIEF COMPLIANT: Follow-up anastrozole   INTERVAL HISTORY: [name] is a 62 y.o. female with above-mentioned history of breast cancer treated with neoadjuvant chemotherapy, lumpectomy and axillary lymph node dissection, radiation, and who is currently on anti-estrogen therapy with anastrozole.She presents to the clinic for a follow-up. Pt reports she is doing well. She states that she lost her dog 2 weeks ago and says something is wrong with her stomach. Tolerating the anastrozole extremely well.   ALLERGIES:  has No Known Allergies.  MEDICATIONS:  Current Outpatient Medications  Medication Sig Dispense Refill   anastrozole (ARIMIDEX) 1 MG tablet Take 1 tablet (1 mg total) by mouth daily. 90 tablet 3   aspirin EC 81 MG tablet Take 1 tablet (81 mg total) by mouth daily. 90 tablet 3   atorvastatin (LIPITOR) 10 MG tablet Take 1 tablet by mouth daily.     busPIRone (BUSPAR) 10 MG tablet Take 10 mg by mouth 3 (three) times daily as needed.     carvedilol (COREG) 6.25 MG tablet TAKE 1 TABLET BY MOUTH TWICE DAILY (Patient taking differently: Take 6.25 mg by mouth 2 (two) times daily with a meal.) 180 tablet 1   naproxen (NAPROSYN) 500 MG tablet Take 1 tablet (500 mg total) by mouth 2 (two) times daily with a meal. 60 tablet 3   omeprazole (PRILOSEC) 20 MG capsule TAKE 1 CAPSULE BY MOUTH EVERY DAY 90 capsule  3   pantoprazole (PROTONIX) 40 MG tablet Take 1 tablet (40 mg total) by mouth daily. 90 tablet 1   pregabalin (LYRICA) 50 MG capsule TAKE 1 CAPSULE BY MOUTH THREE TIMES A DAY 90 capsule 3   cephALEXin (KEFLEX) 500 MG capsule Take 500 mg by mouth 4 (four) times daily. (Patient not taking: Reported on 05/08/2023)     meloxicam (MOBIC) 15 MG tablet Take 15 mg by mouth daily as needed. (Patient  not taking: Reported on 05/08/2023)     methocarbamol (ROBAXIN) 500 MG tablet Take 1,000 mg by mouth 4 (four) times daily as needed. (Patient not taking: Reported on 05/08/2023)     No current facility-administered medications for this visit.   Facility-Administered Medications Ordered in Other Visits  Medication Dose Route Frequency Provider Last Rate Last Admin   gadopentetate dimeglumine (MAGNEVIST) injection 20 mL  20 mL Intravenous Once PRN Anson Fret, MD       gadopentetate dimeglumine (MAGNEVIST) injection 20 mL  20 mL Intravenous Once PRN Anson Fret, MD        PHYSICAL EXAMINATION: ECOG PERFORMANCE STATUS: 1 - Symptomatic but completely ambulatory  Vitals:   05/08/23 0809  BP: 117/79  Pulse: 78  Resp: 18  Temp: 97.6 F (36.4 C)  SpO2: 98%   Filed Weights   05/08/23 0809  Weight: 276 lb (125.2 kg)    BREAST: No palpable masses or nodules in either right or left breasts. No palpable axillary supraclavicular or infraclavicular adenopathy no breast tenderness or nipple discharge. (exam performed in the presence of a chaperone)  LABORATORY DATA:  I have reviewed the data as listed    Latest Ref Rng & Units 09/06/2021    7:54 AM 01/30/2021    8:18 AM 09/27/2020    8:16 AM  CMP  Glucose 70 - 99 mg/dL  098  99   BUN 6 - 20 mg/dL  16  15   Creatinine 1.19 - 1.00 mg/dL 1.47  8.29  5.62   Sodium 135 - 145 mmol/L  143  141   Potassium 3.5 - 5.1 mmol/L  3.9  3.8   Chloride 98 - 111 mmol/L  105  106   CO2 22 - 32 mmol/L  30  29   Calcium 8.9 - 10.3 mg/dL  9.4  9.8   Total Protein 6.5 - 8.1 g/dL  6.9  7.2   Total Bilirubin 0.3 - 1.2 mg/dL  0.4  0.5   Alkaline Phos 38 - 126 U/L  85  90   AST 15 - 41 U/L  16  17   ALT 0 - 44 U/L  15  16     Lab Results  Component Value Date   WBC 3.9 (L) 01/30/2021   HGB 12.0 01/30/2021   HCT 37.7 01/30/2021   MCV 87.5 01/30/2021   PLT 158 01/30/2021   NEUTROABS 2.4 01/30/2021    ASSESSMENT & PLAN:  Malignant neoplasm  of upper-outer quadrant of left breast in female, estrogen receptor positive (HCC) 10/22/2018: Screening detected left breast architectural distortion: 4.5 cm, UOQ, by ultrasound measured 5 cm at 1 o'clock position multiple enlarged lymph nodes, biopsy revealed grade 1 IDC with DCIS with lymphovascular invasion, lymph node biopsy positive, intramammary lymph node biopsy negative, ER 100%, PR 100%, Ki-67 20%, HER-2 1+ by IHC, T2 and 1 stage Ib 11/01/2018: MRI biopsy left breast IDC grade 1, ER PR positive HER-2 negative   Left lumpectomy: 02/22/2019:([facility]): IDC with DCIS, 5.8cm, margins  negative, lymphovascular invasion present HER2 negative, ER 90%, PR 90%, Ki67 2%, clear margins,3/5 LN positive for malignancy.  [facility] 03/11/2019: 3/14 lymph nodes positive with focal extranodal involvement [id number]   Treatment plan: 1.  Neoadjuvant chemotherapy with dose dense Adriamycin and Cytoxan every 2 weeks x4 followed by Taxol weekly x 3 discontinued due to severe peripheral neuropathy completed 01/20/2019 2.  Followed by mastectomy on 02/22/2019 3.  Followed by radiation 04/20/2019-06/02/2019 4.  Followed by antiestrogen therapy. 06/02/2019 ----------------------------------------------------------------------------------------------------------------------------------------------------- Current treatment: Anastrozole started 06/02/2019 Anastrozole toxicities: Hot flashes (night sweats)   Lung nodules: Repeat CT scan 12/19/2019: Stable subcentimeter lung nodules bilaterally suggesting benign etiology CT 06/27/2020: Bilateral lower lobe pulmonary nodules.  Overall impression is stable nodules (some mild increase from 3 to 5 mm and 4 to 5 mm and 5 to 7 mm 01/31/21: CT chest: Stable tiny lung nodules Pulm HTN. 09/06/21: CT Chest:  Stable lung nodules favored benign    Breast cancer surveillance: Mammogram 04/17/2022: Benign breast density category A Bone density 10/25/2019: T score -0.9: Normal Liver MRI 10/13/2019: No  findings of metastatic disease.  Indeterminate right hepatic lobe lesion favored to be a cyst Breast exam 05/08/2023: Benign   Abdominal pain and upper back pain: CT scan 07/15/2022: Stable examination.  No new or progressive findings to suggest metastatic disease.  Stable small pulmonary nodules.  Subtle lesion inferior right lobe of the liver cyst  We will obtain another CT chest abdomen pelvis in October and follow-up after that with a phone call.  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 Return to clinic in 1 year for follow-up    Orders Placed This Encounter  Procedures   CT CHEST ABDOMEN PELVIS W CONTRAST    Standing Status:   Future    Standing Expiration Date:   05/07/2024    Order Specific Question:   If indicated for the ordered procedure, I authorize the administration of contrast media per Radiology protocol    Answer:   Yes    Order Specific Question:   Does the patient have a contrast media/X-ray dye allergy?    Answer:   No    Order Specific Question:   Preferred imaging location?    Answer:   [facility]    Order Specific Question:   If indicated for the ordered procedure, I authorize the administration of oral contrast media per Radiology protocol    Answer:   Yes   The patient has a good understanding of the overall plan. she agrees with it. she will call with any problems that may develop before the next visit here. Total time spent: 30 mins including face to face time and time spent for planning, charting and co-ordination of care   Tamsen Meek, MD 05/08/23    I Janan Ridge am acting as a Neurosurgeon for [employer]  I have reviewed the above documentation for accuracy and completeness, and I agree with the above.

## 2023-05-08 ENCOUNTER — Inpatient Hospital Stay: Payer: BC Managed Care – PPO | Attending: Hematology and Oncology | Admitting: Hematology and Oncology

## 2023-05-08 ENCOUNTER — Other Ambulatory Visit: Payer: Self-pay

## 2023-05-08 VITALS — BP 117/79 | HR 78 | Temp 97.6°F | Resp 18 | Ht 74.0 in | Wt 276.0 lb

## 2023-05-08 DIAGNOSIS — K769 Liver disease, unspecified: Secondary | ICD-10-CM | POA: Diagnosis not present

## 2023-05-08 DIAGNOSIS — Z79899 Other long term (current) drug therapy: Secondary | ICD-10-CM | POA: Insufficient documentation

## 2023-05-08 DIAGNOSIS — Z9221 Personal history of antineoplastic chemotherapy: Secondary | ICD-10-CM | POA: Diagnosis not present

## 2023-05-08 DIAGNOSIS — C50412 Malignant neoplasm of upper-outer quadrant of left female breast: Secondary | ICD-10-CM | POA: Insufficient documentation

## 2023-05-08 DIAGNOSIS — Z79811 Long term (current) use of aromatase inhibitors: Secondary | ICD-10-CM | POA: Diagnosis not present

## 2023-05-08 DIAGNOSIS — Z170 Estrogen receptor positive status [ER+]: Secondary | ICD-10-CM | POA: Diagnosis not present

## 2023-05-08 DIAGNOSIS — Z923 Personal history of irradiation: Secondary | ICD-10-CM | POA: Insufficient documentation

## 2023-05-08 DIAGNOSIS — R918 Other nonspecific abnormal finding of lung field: Secondary | ICD-10-CM | POA: Diagnosis not present

## 2023-05-08 NOTE — Assessment & Plan Note (Addendum)
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  [facility] 03/11/2019: 3/14 lymph nodes positive with focal extranodal involvement [id number]   Treatment plan: 1.  Neoadjuvant chemotherapy with dose dense Adriamycin and Cytoxan every 2 weeks x4 followed by Taxol weekly x 3 discontinued due to severe peripheral neuropathy completed 01/20/2019 2.  Followed by mastectomy on 02/22/2019 3.  Followed by radiation 04/20/2019-06/02/2019 4.  Followed by antiestrogen therapy. 06/02/2019 ----------------------------------------------------------------------------------------------------------------------------------------------------- Current treatment: Anastrozole started 06/02/2019 Anastrozole toxicities: Hot flashes (night sweats)   Lung nodules: Repeat CT scan 12/19/2019: Stable subcentimeter lung nodules bilaterally suggesting benign etiology CT 06/27/2020: Bilateral lower lobe pulmonary nodules.  Overall impression is stable nodules (some mild increase from 3 to 5 mm and 4 to 5 mm and 5 to 7 mm 01/31/21: CT chest: Stable tiny lung nodules Pulm HTN. 09/06/21: CT Chest:  Stable lung nodules favored benign    Breast cancer surveillance: Mammogram 04/17/2022: Benign breast density category A Bone density 10/25/2019: T score -0.9: Normal Liver MRI 10/13/2019: No findings of metastatic disease.  Indeterminate right hepatic lobe lesion favored to  be a cyst Breast exam 05/08/2023: Benign   Abdominal pain and upper back pain: CT scan 07/15/2022: Stable examination.  No new or progressive findings to suggest metastatic disease.  Stable small pulmonary nodules.  Subtle lesion inferior right lobe of the liver cyst  We will obtain another CT chest abdomen pelvis in October and follow-up after that with a phone call.  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 Return to clinic in 1 year for follow-up

## 2023-05-10 ENCOUNTER — Ambulatory Visit: Payer: BC Managed Care – PPO | Admitting: Hematology and Oncology

## 2023-05-12 ENCOUNTER — Ambulatory Visit (INDEPENDENT_AMBULATORY_CARE_PROVIDER_SITE_OTHER): Payer: BC Managed Care – PPO | Admitting: Podiatry

## 2023-05-12 ENCOUNTER — Encounter: Payer: Self-pay | Admitting: Podiatry

## 2023-05-12 DIAGNOSIS — M19072 Primary osteoarthritis, left ankle and foot: Secondary | ICD-10-CM | POA: Diagnosis not present

## 2023-05-12 DIAGNOSIS — M7672 Peroneal tendinitis, left leg: Secondary | ICD-10-CM

## 2023-05-12 DIAGNOSIS — G8929 Other chronic pain: Secondary | ICD-10-CM

## 2023-05-12 DIAGNOSIS — M79672 Pain in left foot: Secondary | ICD-10-CM

## 2023-05-12 NOTE — Progress Notes (Signed)
She presents today for follow-up of her peroneal tendinitis and worse the dorsal aspect of her bilateral feet.  She states that her left 1 is the worst.  She states that she is recently had her veins checked and she checked out fine as far as that goes.  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  She states that is starting to affect her ability to work as well as her activities of daily living and ability to exercise and maintain general good health.  Objective: Vital signs are stable alert and oriented x 3.  I reviewed her past medical history medications allergies surgeries and social history.  Reviewing radiographs demonstrating osteoarthritic changes in the midfoot and rear foot.  She has had multiple surgeries to her forefoot bilaterally left most significant.  She has pain on palpation of the dorsal and plantar aspect of the tarsometatarsal joints with dorsal spurring.  She has pain on frontal plane range of motion and prominent plantar nodules associated with the arthritic change.  She has minimal edema pulses are strong and palpable painful subtalar joint range of motion as well.  Assessment: Chronic intractable arthritic pain midfoot left.  Chronic arthritic pain subtalar joint left.  Plan: Discussed etiology pathology conservative surgical therapies at this point I think all conservative therapies have failed to render this patient asymptomatic so at this point we did discuss the need for surgical intervention.  I did explain to her that I would like to have the CT scan performed of the midfoot and rear foot to better assess our ability for surgical repair.  This is for surgical consideration and  differential diagnosis.

## 2023-05-14 ENCOUNTER — Encounter: Payer: Self-pay | Admitting: Podiatry

## 2023-05-14 ENCOUNTER — Telehealth: Payer: Self-pay | Admitting: Podiatry

## 2023-05-14 NOTE — Telephone Encounter (Signed)
Patient wants to know if her CT has been authorized / scheduled yet.  She is willing to do a Saturday appointment if needed to get it done.    Please call once you have this authorized / scheduled.

## 2023-05-15 ENCOUNTER — Ambulatory Visit
Admission: RE | Admit: 2023-05-15 | Discharge: 2023-05-15 | Disposition: A | Discharge status: Home / Self Care | Payer: BC Managed Care – PPO | Source: Ambulatory Visit | Attending: Podiatry | Admitting: Podiatry

## 2023-05-15 ENCOUNTER — Other Ambulatory Visit: Payer: BC Managed Care – PPO

## 2023-05-15 ENCOUNTER — Encounter: Payer: Self-pay | Admitting: Podiatry

## 2023-05-15 DIAGNOSIS — G8929 Other chronic pain: Secondary | ICD-10-CM

## 2023-05-15 DIAGNOSIS — M7672 Peroneal tendinitis, left leg: Secondary | ICD-10-CM

## 2023-05-15 DIAGNOSIS — M19072 Primary osteoarthritis, left ankle and foot: Secondary | ICD-10-CM

## 2023-05-19 NOTE — Telephone Encounter (Signed)
Messaged [facility] for response

## 2023-05-20 NOTE — Telephone Encounter (Signed)
Ankle CT has been done, pending foot CT with insurance, patient aware

## 2023-05-29 ENCOUNTER — Telehealth: Payer: Self-pay | Admitting: Podiatry

## 2023-05-29 NOTE — Telephone Encounter (Signed)
Called pt.

## 2023-05-29 NOTE — Telephone Encounter (Signed)
Called pt to set up an appt w/ Dr. Lilian Kapur per [place]. Let pt know that Dr. Al Corpus said  to "HAVE HER IN WITH [facility] FOR SURGICAL CONSIDERATION.  SHE NEEDS A MIDFOOT FUSION AND I DON'T DO THOSE.  I THINK HE IS THE BEST WITH THESE.  I WILL BE HAPPY TO ASSIST IN SURGERY.  PLEASE INFORM THE PATIENT OF THE DECISION."

## 2023-06-30 ENCOUNTER — Ambulatory Visit: Payer: BC Managed Care – PPO | Admitting: Podiatry

## 2023-06-30 ENCOUNTER — Ambulatory Visit: Payer: BC Managed Care – PPO | Admitting: Hematology and Oncology

## 2023-07-02 ENCOUNTER — Ambulatory Visit (INDEPENDENT_AMBULATORY_CARE_PROVIDER_SITE_OTHER): Payer: BC Managed Care – PPO | Admitting: Podiatry

## 2023-07-02 ENCOUNTER — Encounter: Payer: Self-pay | Admitting: Podiatry

## 2023-07-02 DIAGNOSIS — M19072 Primary osteoarthritis, left ankle and foot: Secondary | ICD-10-CM | POA: Diagnosis not present

## 2023-07-02 DIAGNOSIS — G8929 Other chronic pain: Secondary | ICD-10-CM | POA: Diagnosis not present

## 2023-07-02 DIAGNOSIS — M79672 Pain in left foot: Secondary | ICD-10-CM | POA: Diagnosis not present

## 2023-07-02 DIAGNOSIS — E559 Vitamin D deficiency, unspecified: Secondary | ICD-10-CM

## 2023-07-05 NOTE — Progress Notes (Signed)
Subjective:  Patient ID: [name], female    DOB: 1961/07/08,  MRN: [id number]  Chief Complaint  Patient presents with   Results    CT scan results    62 y.o. female presents with the above complaint. History confirmed with patient.  She has been under the care of Dr. Al Corpus for several years.  Approximately 8 years ago underwent left midfoot dorsal exostectomy for ongoing foot pain.  This improved for some time but has worsened over the last several months.  She has been unable to find appropriate shoe gear that is beneficial for her pain.  OTC anti-inflammatories and medications are no longer controlling her pain.  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  Objective:  Physical Exam: warm, good capillary refill, no trophic changes or ulcerative lesions, normal DP and PT pulses, normal sensory exam, and well-healed dorsal surgical scar of bunionectomy and lesser ray surgery, dorsal midfoot scar well-healed, she has diffuse midfoot pain throughout the transverse midtarsal arch   Radiographs: Multiple views x-ray of the left foot: Midfoot degenerative changes noted prior hammertoe correction and bunionectomy that are well-healed  CT scan 05/26/2023  IMPRESSION: 1. No acute osseous injury of the left ankle. 2. Multiple well corticated bony fragment at the tip of the medial malleolus consistent with prior avulsive injury. Tiny bony fragment at the tip of the lateral malleolus consistent with prior avulsion injury. 3. 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 Mild osteoarthritis of the navicular-medial cuneiform joint. 4. Moderate osteoarthritis of the first, second, third and fourth TMT joint.     Electronically Signed   By: Elige Ko M.D.   On: 05/26/2023 12:04 Assessment:   1. Vitamin D deficiency disease   2. Arthritis of left midfoot   3. Chronic foot pain, left      Plan:  Patient was evaluated and treated and all questions  answered.  We reviewed her last radiographs and current CT scan together.  We discussed the presence of the severe degenerative changes of the navicular cuneiform and tarsometatarsal joints.  We discussed further treatment of this, so far has been unresponsive to offloading, shoe gear changes, insoles, anti-inflammatories.  She is interested in proceeding with surgical correction at this point and I did agree that her arthritis has progressed from the dorsal spurring to end-stage osteoarthrosis of the midfoot joints.  I recommended midfoot midtarsal arthrodesis of the multiple joints that are affected.  We discussed the rationale and procedures in detail including the recovery time and need for nonweightbearing for several weeks.  I recommended checking a vitamin D and calcium level and this was ordered.  We discussed all risk benefits and potential complications including but not limited to  pain, swelling, infection, scar, numbness which may be temporary or permanent, chronic pain, stiffness, nerve pain or damage, wound healing problems, bone healing problems including delayed or non-union.  All questions addressed and informed consent was signed and reviewed.  Encouraged to call if questions or concerns about her surgical plan   Surgical plan:  Procedure: -Left multiple [place] and TMT fusions with bone graft and BMAC  Location: -[facility]  Anesthesia plan: -General Anesthesia with regional  Postoperative pain plan: - Tylenol 1000 mg every 6 hours, gabapentin 300 mg every 8 hours x5 days, oxycodone 5 mg 1-2 tabs every 6 hours only as needed  DVT prophylaxis: -Xarelto 10 mg nightly  WB Restrictions / DME needs: -NWB in knee scooter postop    No  follow-ups on file.

## 2023-07-14 ENCOUNTER — Telehealth: Payer: Self-pay | Admitting: Podiatry

## 2023-07-14 ENCOUNTER — Other Ambulatory Visit: Payer: Self-pay | Admitting: *Deleted

## 2023-07-14 DIAGNOSIS — Z170 Estrogen receptor positive status [ER+]: Secondary | ICD-10-CM

## 2023-07-14 NOTE — Telephone Encounter (Addendum)
DOS- 09/18/2023  ARTHRODESIS OF TOES [id number] BONE GRAFT LT- 20900  [employer] EFFECTIVE DATE- 10/13/2022  DEDUCTIBLE- $1500.00 WITH REMAINING $1500.00 OOP- $5900.00 WITH REMAINING $5304.15 COINSURANCE- 30 %  SPOKE WITH KIM P. FROM [employer] [phone]) AND SHE STATED THAT PRIOR AUTHORIZATION IS NOT REQUIRED FOR CPT CODES 78295 AND 20900.  CALL REFERENCE NUMBER: Soledad Gerlach 07/14/2023 @ 2:56 PM EST    [employer] EFFECTIVE DATE- 04/13/2023  DEDUCTIBLE- $0.00 WITH REMAINING $0.00 OOP - $9400.00 WITH REMAINING $9,124.83  COINSURANCE- 0%  SPOKE WITH ROSE D. FROM [employer] ([phone]) AND SHE STATED THAT PRIOR AUTHORIZATION IS NOT REQUIRED FOR CPT CODES 69629 AND 20900.  CALL REFERENCE NUMBER: [id number]

## 2023-07-14 NOTE — Progress Notes (Signed)
Received message from PA team stating due to insurance reasoning, pt needing to have CT CAP preformed at [facility].  Orders placed, appt scheduled.  RN attempt x1 to contact pt with updated

## 2023-07-16 ENCOUNTER — Ambulatory Visit: Payer: BC Managed Care – PPO | Admitting: Podiatry

## 2023-07-16 ENCOUNTER — Ambulatory Visit (HOSPITAL_COMMUNITY): Payer: BC Managed Care – PPO

## 2023-07-16 ENCOUNTER — Ambulatory Visit
Admission: RE | Admit: 2023-07-16 | Discharge: 2023-07-16 | Disposition: A | Discharge status: Home / Self Care | Payer: BC Managed Care – PPO | Source: Ambulatory Visit | Attending: Hematology and Oncology | Admitting: Hematology and Oncology

## 2023-07-16 DIAGNOSIS — R918 Other nonspecific abnormal finding of lung field: Secondary | ICD-10-CM | POA: Diagnosis not present

## 2023-07-16 DIAGNOSIS — Z170 Estrogen receptor positive status [ER+]: Secondary | ICD-10-CM

## 2023-07-16 DIAGNOSIS — C50912 Malignant neoplasm of unspecified site of left female breast: Secondary | ICD-10-CM | POA: Diagnosis not present

## 2023-07-16 MED ORDER — IOPAMIDOL (ISOVUE-300) INJECTION 61%
500.0000 mL | Freq: Once | INTRAVENOUS | Status: AC | PRN
  Administered 2023-07-16: 100 mL via INTRAVENOUS

## 2023-07-17 ENCOUNTER — Other Ambulatory Visit: Payer: BC Managed Care – PPO

## 2023-07-17 LAB — CALCIUM: Calcium: 9.5 mg/dL (ref 8.7–10.3)

## 2023-07-17 LAB — VITAMIN D 25 HYDROXY (VIT D DEFICIENCY, FRACTURES): Vit D, 25-Hydroxy: 13.8 ng/mL — ABNORMAL LOW (ref 30.0–100.0)

## 2023-07-20 MED ORDER — VITAMIN D (ERGOCALCIFEROL) 1.25 MG (50000 UNIT) PO CAPS: 50000.0000 [IU] | ORAL_CAPSULE | ORAL | 0 refills | Status: DC

## 2023-07-20 NOTE — Addendum Note (Signed)
Addended byLilian Kapur, Dmarius Reeder R on: 07/20/2023 01:19 PM   Modules accepted: Orders

## 2023-07-23 ENCOUNTER — Encounter: Payer: BC Managed Care – PPO | Admitting: Podiatry

## 2023-07-23 ENCOUNTER — Inpatient Hospital Stay: Payer: BC Managed Care – PPO | Attending: Hematology and Oncology | Admitting: Hematology and Oncology

## 2023-07-23 ENCOUNTER — Telehealth: Payer: Self-pay

## 2023-07-23 DIAGNOSIS — R918 Other nonspecific abnormal finding of lung field: Secondary | ICD-10-CM | POA: Diagnosis not present

## 2023-07-23 DIAGNOSIS — Z170 Estrogen receptor positive status [ER+]: Secondary | ICD-10-CM

## 2023-07-23 DIAGNOSIS — C50412 Malignant neoplasm of upper-outer quadrant of left female breast: Secondary | ICD-10-CM

## 2023-07-23 NOTE — Progress Notes (Signed)
HEMATOLOGY-ONCOLOGY TELEPHONE VISIT PROGRESS NOTE  I connected with our patient on 07/23/23 at  8:00 AM EDT by telephone and verified that I am speaking with the correct person using two identifiers.  I discussed the limitations, risks, security and privacy concerns of performing an evaluation and management service by telephone and the availability of in person appointments.  I also discussed with the patient that there may be a patient responsible charge related to this service. The patient expressed understanding and agreed to proceed.   History of Present Illness: Follow-up to discuss results of CT scans  History of Present Illness   The patient, with a history of breast cancer had a CT scan for  lung nodules ,Ct showed an increase in size of several nodules. Specifically, a nodule that was previously 0.6 cm is now 0.8 cm, another that was 0.4 cm is now 1 cm, and a third that was 0.2 cm is now 0.7 cm. A few other nodules remained stable. The patient expressed concern and sought clarification on the next steps.        Oncology History  Malignant neoplasm of upper-outer quadrant of left breast in female, estrogen receptor positive (HCC)  10/22/2018 Initial Diagnosis   Screening detected left breast architectural distortion: 4.5 cm, UOQ, by ultrasound measured 5 cm at 1 o'clock position multiple enlarged lymph nodes, biopsy revealed grade 1 IDC with DCIS with lymphovascular invasion, lymph node biopsy positive, intramammary lymph node biopsy negative, ER 100%, PR 100%, Ki-67 20%, HER-2 1+ by IHC, T2 and 1 stage 2A   10/27/2018 Cancer Staging   Staging form: Breast, AJCC 8th Edition - Clinical: Stage IIA (cT2, cN1(f), cM0, G1, ER+, PR+, HER2-) - Signed by Serena Croissant, MD on 11/01/2018   11/11/2018 - 12/20/2018 Neo-Adjuvant Chemotherapy   Neoadjuvant chemotherapy with dose dense Adriamycin and Cytoxan every 2 weeks x4 followed by Taxol weekly x12 (stopped after cycle 3 due to severe neuropathy)    02/01/2019 Breast MRI   Known malignancy in left breast measures 4.7 x 3.5 x 7.2cm, previously 5 x 3 x 10cm.    02/22/2019 Surgery   Left lumpectomy ([facility]): IDC with DCIS, 5.8cm, HER2 negative, ER 90%, PR 90%, Ki67 2%, clear margins,3/5 LN positive for malignancy.    03/02/2019 Cancer Staging   Staging form: Breast, AJCC 8th Edition - Pathologic stage from 03/02/2019: No Stage Recommended (ypT3, pN1a, cM0, G2, ER+, PR+, HER2-) - Signed by Serena Croissant, MD on 03/02/2019   03/11/2019 Surgery   Left axillary lymph node dissection ([facility]): metastatic carcinoma in 3/14 lymph nodes, focal extranodal involvement by tumor.   04/20/2019 - 06/02/2019 Radiation Therapy   Adjuvant radiation therapy   05/30/2019 -  Anti-estrogen oral therapy   Anastrozole 1 mg daily     REVIEW OF SYSTEMS:   Constitutional: Denies fevers, chills or abnormal weight loss All other systems were reviewed with the patient and are negative. Observations/Objective:     Assessment Plan:  Malignant neoplasm of upper-outer quadrant of left breast in female, estrogen receptor positive (HCC) 10/22/2018: Screening detected left breast architectural distortion: 4.5 cm, UOQ, by ultrasound measured 5 cm at 1 o'clock position multiple enlarged lymph nodes, biopsy revealed grade 1 IDC with DCIS with lymphovascular invasion, lymph node biopsy positive, intramammary lymph node biopsy negative, ER 100%, PR 100%, Ki-67 20%, HER-2 1+ by IHC, T2 and 1 stage Ib 11/01/2018: MRI biopsy left breast IDC grade 1, ER PR positive HER-2 negative   Left lumpectomy: 02/22/2019:([facility]): IDC with DCIS, 5.8cm,  margins negative, lymphovascular invasion present HER2 negative, ER 90%, PR 90%, Ki67 2%, clear margins,3/5 LN positive for malignancy.  [facility] L&D 03/11/2019: 3/14 lymph nodes positive with focal extranodal involvement [id number]   Treatment plan: 1.  Neoadjuvant chemotherapy with dose dense Adriamycin and Cytoxan every 2 weeks x4 followed by Taxol  weekly x 3 discontinued due to severe peripheral neuropathy completed 01/20/2019 2.  Followed by mastectomy on 02/22/2019 3.  Followed by radiation 04/20/2019-06/02/2019 4.  Followed by antiestrogen therapy. 06/02/2019 ----------------------------------------------------------------------------------------------------------------------------------------------------- Current treatment: Anastrozole started 06/02/2019 Anastrozole toxicities: Hot flashes (night sweats)   Lung nodules: Repeat CT scan 12/19/2019: Stable subcentimeter lung nodules bilaterally suggesting benign etiology CT 06/27/2020: Bilateral lower lobe pulmonary nodules.  Overall impression is stable nodules (some mild increase from 3 to 5 mm and 4 to 5 mm and 5 to 7 mm 01/31/21: CT chest: Stable tiny lung nodules Pulm HTN. 09/06/21: CT Chest:  Stable lung nodules favored benign    Breast cancer surveillance: Mammogram 04/17/2022: Benign breast density category A Bone density 10/25/2019: T score -0.9: Normal Liver MRI 10/13/2019: No findings of metastatic disease.  Indeterminate right hepatic lobe lesion favored to be a cyst Breast exam 05/08/2023: Benign   CT scan 07/15/2022: Stable examination.  No new or progressive findings to suggest metastatic disease.  Stable small pulmonary nodules.  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 -Order PET scan to further evaluate the nodules. -Refer to pulmonologist, Dr. Tonia Brooms, for potential biopsy         I discussed the assessment and treatment plan with the patient. The patient was provided an opportunity to ask questions and all were answered. The patient agreed with the plan and demonstrated an understanding of the  instructions. The patient was advised to call back or seek an in-person evaluation if the symptoms worsen or if the condition fails to improve as anticipated.   I provided 12 minutes of non-face-to-face time during this encounter.  This includes time for charting and coordination of care   Tamsen Meek, MD

## 2023-07-23 NOTE — Assessment & Plan Note (Signed)
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  AX L&D 03/11/2019: 3/14 lymph nodes positive with focal extranodal involvement [id number]   Treatment plan: 1.  Neoadjuvant chemotherapy with dose dense Adriamycin and Cytoxan every 2 weeks x4 followed by Taxol weekly x 3 discontinued due to severe peripheral neuropathy completed 01/20/2019 2.  Followed by mastectomy on 02/22/2019 3.  Followed by radiation 04/20/2019-06/02/2019 4.  Followed by antiestrogen therapy. 06/02/2019 ----------------------------------------------------------------------------------------------------------------------------------------------------- Current treatment: Anastrozole started 06/02/2019 Anastrozole toxicities: Hot flashes (night sweats)   Lung nodules: Repeat CT scan 12/19/2019: Stable subcentimeter lung nodules bilaterally suggesting benign etiology CT 06/27/2020: Bilateral lower lobe pulmonary nodules.  Overall impression is stable nodules (some mild increase from 3 to 5 mm and 4 to 5 mm and 5 to 7 mm 01/31/21: CT chest: Stable tiny lung nodules Pulm HTN. 09/06/21: CT Chest:  Stable lung nodules favored benign    Breast cancer surveillance: Mammogram 04/17/2022: Benign breast density category A Bone density 10/25/2019: T score -0.9: Normal Liver MRI 10/13/2019: No findings of metastatic disease.  Indeterminate right hepatic lobe lesion favored to  be a cyst Breast exam 05/08/2023: Benign   CT scan 07/15/2022: Stable examination.  No new or progressive findings to suggest metastatic disease.  Stable small pulmonary nodules.  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 Return to clinic in 1 year for follow-up

## 2023-07-23 NOTE — Telephone Encounter (Signed)
Called pt to advise PET scan was scheduled for 07/29/23 at 2 PM at [facility] with a 1:30 arrival. Pt must be water only 6 hr prior to imaging.   Message sent to PA team to initiate PA.   MD would like for pt to follow up with Dr Tonia Brooms for bronchoscopy. Once bronch complete and path is back she will see Dr Pamelia Hoit to discuss next steps.

## 2023-07-29 ENCOUNTER — Ambulatory Visit: Payer: Self-pay | Admitting: Cardiology

## 2023-07-29 ENCOUNTER — Ambulatory Visit
Admission: RE | Admit: 2023-07-29 | Discharge: 2023-07-29 | Disposition: A | Discharge status: Home / Self Care | Payer: BC Managed Care – PPO | Source: Ambulatory Visit | Attending: Hematology and Oncology | Admitting: Hematology and Oncology

## 2023-07-29 DIAGNOSIS — I2510 Atherosclerotic heart disease of native coronary artery without angina pectoris: Secondary | ICD-10-CM | POA: Insufficient documentation

## 2023-07-29 DIAGNOSIS — Z170 Estrogen receptor positive status [ER+]: Secondary | ICD-10-CM | POA: Diagnosis not present

## 2023-07-29 DIAGNOSIS — C50412 Malignant neoplasm of upper-outer quadrant of left female breast: Secondary | ICD-10-CM | POA: Insufficient documentation

## 2023-07-29 DIAGNOSIS — K449 Diaphragmatic hernia without obstruction or gangrene: Secondary | ICD-10-CM | POA: Diagnosis not present

## 2023-07-29 DIAGNOSIS — I700 Atherosclerosis of aorta: Secondary | ICD-10-CM | POA: Diagnosis not present

## 2023-07-29 DIAGNOSIS — R918 Other nonspecific abnormal finding of lung field: Secondary | ICD-10-CM | POA: Insufficient documentation

## 2023-07-29 DIAGNOSIS — C50919 Malignant neoplasm of unspecified site of unspecified female breast: Secondary | ICD-10-CM | POA: Diagnosis not present

## 2023-07-29 LAB — GLUCOSE, CAPILLARY: Glucose-Capillary: 87 mg/dL (ref 70–99)

## 2023-07-29 MED ORDER — FLUDEOXYGLUCOSE F - 18 (FDG) INJECTION
12.0000 | Freq: Once | INTRAVENOUS | Status: AC | PRN
  Administered 2023-07-29: 12.56 via INTRAVENOUS

## 2023-07-30 ENCOUNTER — Ambulatory Visit: Payer: BC Managed Care – PPO | Admitting: Podiatry

## 2023-07-31 ENCOUNTER — Ambulatory Visit (INDEPENDENT_AMBULATORY_CARE_PROVIDER_SITE_OTHER): Payer: BC Managed Care – PPO | Admitting: Pulmonary Disease

## 2023-07-31 ENCOUNTER — Encounter: Payer: Self-pay | Admitting: Pulmonary Disease

## 2023-07-31 VITALS — BP 126/84 | HR 84 | Temp 98.3°F | Ht 74.0 in | Wt 281.8 lb

## 2023-07-31 DIAGNOSIS — R911 Solitary pulmonary nodule: Secondary | ICD-10-CM | POA: Diagnosis not present

## 2023-07-31 DIAGNOSIS — Z853 Personal history of malignant neoplasm of breast: Secondary | ICD-10-CM | POA: Diagnosis not present

## 2023-07-31 NOTE — H&P (View-Only) (Signed)
Synopsis: Referred in October 2024 for pulmonary nodules by Elie Confer, NP  Subjective:   PATIENT ID: [name] GENDER: female DOB: [birth year], MRN: [id number]  Chief Complaint  Patient presents with   Consult    Review CT and PET scan    This is a 62 year old female, past medical history of obesity, gastroesophageal reflux, left breast cancer diagnosed in 2020.  Patient had follow-up CT imaging that shows pulmonary nodules.  Subsequent imaging this year showed increase in the size of the nodule she underwent nuclear medicine pet imaging that was concerning for metastatic disease and possible reactive adenopathy within the hilum.  Also small hypermetabolic nodules within the chest wall.  Patient was referred to me for consideration bronchoscopy and biopsy of the enlarging pulmonary nodule in the right lower lobe.    Past Medical History:  Diagnosis Date   Complication of anesthesia    PONV   Fatigue    GERD (gastroesophageal reflux disease)    w nonobstructing esophageal stricture   Headache    Heat intolerance    History of ETT 03/2009   myoview EF 65%, normal wall motion, no ischemia or infarction, poor exercise capacity   HTN (hypertension)    left breast ca dx'd 10/2018   breast- left   Motion sickness    Normal echocardiogram 03/2009   EF 60-65% moderate diastolic dysfunction, mild LAE   Numbness and tingling    Obesity    PONV (postoperative nausea and vomiting)    S/P partial hysterectomy      Family History  Problem Relation Age of Onset   Heart attack Mother 73   Heart disease Mother    Diabetes Sister    Heart attack Maternal Grandmother        age 45 or 74   Heart disease Maternal Grandmother    Neuropathy Neg Hx    Colon cancer Neg Hx    Colon polyps Neg Hx    Esophageal cancer Neg Hx    Stomach cancer Neg Hx    Rectal cancer Neg Hx      Past Surgical History:  Procedure Laterality Date   ABDOMINAL HYSTERECTOMY  2007    partial    AXILLARY LYMPH NODE DISSECTION Left 03/11/2019   Procedure: LEFT AXILLARY LYMPH NODE DISSECTION;  Surgeon: Harriette Bouillon, MD;  Location: [facility];  Service: General;  Laterality: Left;   BREAST LUMPECTOMY WITH RADIOACTIVE SEED AND SENTINEL LYMPH NODE BIOPSY Left 02/22/2019   Procedure: LEFT BREAST LUMPECTOMY x2 WITH RADIOACTIVE SEED AND LEFT AXILLA SEED  GUIDED LYMPH NODE BIOPSY AND LEFT SENTINEL LYMPH NODE MAPPING;  Surgeon: Harriette Bouillon, MD;  Location: [facility];  Service: General;  Laterality: Left;   CARPAL TUNNEL RELEASE Bilateral 2018   FOOT SURGERY Bilateral 2016   hysterectomy (other)     PORT-A-CATH REMOVAL N/A 02/22/2019   Procedure: REMOVAL PORT-A-CATH;  Surgeon: Harriette Bouillon, MD;  Location: [facility];  Service: General;  Laterality: N/A;   PORTACATH PLACEMENT N/A 11/10/2018   Procedure: INSERTION PORT-A-CATH WITH ULTRASOUND;  Surgeon: Harriette Bouillon, MD;  Location: [facility];  Service: General;  Laterality: N/A;   TONSILLECTOMY  1968   UPPER GASTROINTESTINAL ENDOSCOPY      Social History   Socioeconomic History   Marital status: Married    Spouse name: Not on file   Number of children: 2   Years of education: Not on file   Highest education level: Not on file  Occupational History   Occupation: Runner, broadcasting/film/video  Tobacco Use   Smoking status: Never    Passive exposure: Never   Smokeless tobacco: Never  Vaping Use   Vaping status: Never Used  Substance and Sexual Activity   Alcohol use: No   Drug use: No   Sexual activity: Yes    Birth control/protection: Surgical  Other Topics Concern   Not on file  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 w/ 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   Right-handed   Caffeine: 1/2 cup coffee during the school year, tea   Social Determinants of Health   Financial Resource Strain: Not on file  Food Insecurity: Not on file  Transportation Needs: Not on file  Physical Activity: Not on file   Stress: Not on file  Social Connections: Unknown (02/24/2022)   Received from [facility], [facility]   Social Network    Social Network: Not on file  Intimate Partner Violence: Unknown (01/16/2022)   Received from [facility], [facility]   HITS    Physically Hurt: Not on file    Insult or Talk Down To: Not on file    Threaten Physical Harm: Not on file    Scream or Curse: Not on file     No Known Allergies   Outpatient Medications Prior to Visit  Medication Sig Dispense Refill   anastrozole (ARIMIDEX) 1 MG tablet Take 1 tablet (1 mg total) by mouth daily. 90 tablet 3   atorvastatin (LIPITOR) 10 MG tablet Take 1 tablet by mouth daily.     carvedilol (COREG) 6.25 MG tablet TAKE 1 TABLET BY MOUTH TWICE DAILY (Patient taking differently: Take 6.25 mg by mouth 2 (two) times daily with a meal.) 180 tablet 1   lisinopril-hydrochlorothiazide (ZESTORETIC) 10-12.5 MG tablet Take 1 tablet by mouth daily.     pantoprazole (PROTONIX) 40 MG tablet Take 1 tablet (40 mg total) by mouth daily. 90 tablet 1   Vitamin D, Ergocalciferol, (DRISDOL) 1.25 MG (50000 UNIT) CAPS capsule Take 1 capsule (50,000 Units total) by mouth every 7 (seven) days. 8 capsule 0   aspirin EC 81 MG tablet Take 1 tablet (81 mg total) by mouth daily. (Patient not taking: Reported on 07/31/2023) 90 tablet 3   busPIRone (BUSPAR) 10 MG tablet Take 10 mg by mouth 3 (three) times daily as needed. (Patient not taking: Reported on 07/31/2023)     cephALEXin (KEFLEX) 500 MG capsule Take 500 mg by mouth 4 (four) times daily. (Patient not taking: Reported on 05/08/2023)     meloxicam (MOBIC) 15 MG tablet Take 15 mg by mouth daily as needed. (Patient not taking: Reported on 05/08/2023)     methocarbamol (ROBAXIN) 500 MG tablet Take 1,000 mg by mouth 4 (four) times daily as needed. (Patient not taking: Reported on 05/08/2023)     naproxen (NAPROSYN) 500 MG tablet Take 1 tablet (500 mg total) by mouth 2 (two) times daily with a meal.  (Patient not taking: Reported on 07/31/2023) 60 tablet 3   omeprazole (PRILOSEC) 20 MG capsule TAKE 1 CAPSULE BY MOUTH EVERY DAY (Patient not taking: Reported on 07/31/2023) 90 capsule 3   pregabalin (LYRICA) 50 MG capsule TAKE 1 CAPSULE BY MOUTH THREE TIMES A DAY (Patient not taking: Reported on 07/31/2023) 90 capsule 3   Facility-Administered Medications Prior to Visit  Medication Dose Route Frequency Provider Last Rate Last Admin   gadopentetate dimeglumine (MAGNEVIST) injection 20 mL  20 mL Intravenous Once PRN Anson Fret, MD  gadopentetate dimeglumine (MAGNEVIST) injection 20 mL  20 mL Intravenous Once PRN Anson Fret, MD        Review of Systems  Constitutional:  Negative for chills, fever, malaise/fatigue and weight loss.  HENT:  Negative for hearing loss, sore throat and tinnitus.   Eyes:  Negative for blurred vision and double vision.  Respiratory:  Negative for cough, hemoptysis, sputum production, shortness of breath, wheezing and stridor.   Cardiovascular:  Negative for chest pain, palpitations, orthopnea, leg swelling and PND.  Gastrointestinal:  Negative for abdominal pain, constipation, diarrhea, heartburn, nausea and vomiting.  Genitourinary:  Negative for dysuria, hematuria and urgency.  Musculoskeletal:  Negative for joint pain and myalgias.  Skin:  Negative for itching and rash.  Neurological:  Negative for dizziness, tingling, weakness and headaches.  Endo/Heme/Allergies:  Negative for environmental allergies. Does not bruise/bleed easily.  Psychiatric/Behavioral:  Negative for depression. The patient is not nervous/anxious and does not have insomnia.   All other systems reviewed and are negative.    Objective:  Physical Exam Vitals reviewed.  Constitutional:      General: She is not in acute distress.    Appearance: She is well-developed.  HENT:     Head: Normocephalic and atraumatic.  Eyes:     General: No scleral icterus.     Conjunctiva/sclera: Conjunctivae normal.     Pupils: Pupils are equal, round, and reactive to light.  Neck:     Vascular: No JVD.     Trachea: No tracheal deviation.  Cardiovascular:     Rate and Rhythm: Normal rate and regular rhythm.     Heart sounds: Normal heart sounds. No murmur heard. Pulmonary:     Effort: Pulmonary effort is normal. No tachypnea, accessory muscle usage or respiratory distress.     Breath sounds: No stridor. No wheezing, rhonchi or rales.  Abdominal:     General: There is no distension.     Palpations: Abdomen is soft.     Tenderness: There is no abdominal tenderness.  Musculoskeletal:        General: No tenderness.     Cervical back: Neck supple.  Lymphadenopathy:     Cervical: No cervical adenopathy.  Skin:    General: Skin is warm and dry.     Capillary Refill: Capillary refill takes less than 2 seconds.     Findings: No rash.  Neurological:     Mental Status: She is alert and oriented to person, place, and time.  Psychiatric:        Behavior: Behavior normal.      Vitals:   07/31/23 1032  BP: 126/84  Pulse: 84  Temp: 98.3 F (36.8 C)  TempSrc: Oral  SpO2: 100%  Weight: 281 lb 12.8 oz (127.8 kg)  Height: 6\' 2"  (1.88 m)   100% on RA BMI Readings from Last 3 Encounters:  07/31/23 36.18 kg/m  05/08/23 35.44 kg/m  11/12/22 36.08 kg/m   Wt Readings from Last 3 Encounters:  07/31/23 281 lb 12.8 oz (127.8 kg)  05/08/23 276 lb (125.2 kg)  11/12/22 281 lb (127.5 kg)     CBC    Component Value Date/Time   WBC 3.9 (L) 01/30/2021 0818   WBC 4.2 02/16/2019 1439   RBC 4.31 01/30/2021 0818   HGB 12.0 01/30/2021 0818   HGB 12.6 11/12/2016 0830   HCT 37.7 01/30/2021 0818   HCT 40.5 11/12/2016 0830   PLT 158 01/30/2021 0818   PLT 199 11/12/2016 0830   MCV 87.5  01/30/2021 0818   MCV 85 11/12/2016 0830   MCV 84 07/06/2014 2145   MCH 27.8 01/30/2021 0818   MCHC 31.8 01/30/2021 0818   RDW 12.3 01/30/2021 0818   RDW 13.8 11/12/2016 0830    RDW 13.1 07/06/2014 2145   LYMPHSABS 1.1 01/30/2021 0818   MONOABS 0.4 01/30/2021 0818   EOSABS 0.1 01/30/2021 0818   BASOSABS 0.0 01/30/2021 0818     Chest Imaging:  Nuclear medicine pet imaging October 2024: Hypermetabolic small nodules within the right chest, some very small in size.  Also has some hypermetabolic uptake within the right hilum. The patient's images have been independently reviewed by me.    Pulmonary Functions Testing Results:     No data to display          FeNO:   Pathology:   Echocardiogram:   Heart Catheterization:     Assessment & Plan:     ICD-10-CM   1. Lung nodule  R91.1 Procedural/ Surgical Case Request: ROBOTIC ASSISTED NAVIGATIONAL BRONCHOSCOPY, VIDEO BRONCHOSCOPY WITH ENDOBRONCHIAL ULTRASOUND    Ambulatory referral to Pulmonology    CT Super D Chest Wo Contrast    2. History of breast cancer  Z85.3       Discussion:  This is a 62 year old female history of breast cancer.  Found to have other small hypermetabolic nodules in the lungs.  1 increased in size.  Concern for malignancy or metastatic disease.  Plan: Today in the office to about risk-benefit alternatives to consideration bronchoscopy and biopsy. Patient is agreeable to proceed. We talked about the risk of bleeding and pneumothorax associated with robotic assisted navigational bronchoscopy. Will also need a bronchoscopy endobronchial ultrasound to examine the right hilum. Also only depending upon what pathology may show in the right lung nodule. Patient is agreeable to proceed with this plan tentative bronchoscopy date will be on 08/10/2023.     Current Outpatient Medications:    anastrozole (ARIMIDEX) 1 MG tablet, Take 1 tablet (1 mg total) by mouth daily., Disp: 90 tablet, Rfl: 3   atorvastatin (LIPITOR) 10 MG tablet, Take 1 tablet by mouth daily., Disp: , Rfl:    carvedilol (COREG) 6.25 MG tablet, TAKE 1 TABLET BY MOUTH TWICE DAILY (Patient taking differently: Take  6.25 mg by mouth 2 (two) times daily with a meal.), Disp: 180 tablet, Rfl: 1   lisinopril-hydrochlorothiazide (ZESTORETIC) 10-12.5 MG tablet, Take 1 tablet by mouth daily., Disp: , Rfl:    pantoprazole (PROTONIX) 40 MG tablet, Take 1 tablet (40 mg total) by mouth daily., Disp: 90 tablet, Rfl: 1   Vitamin D, Ergocalciferol, (DRISDOL) 1.25 MG (50000 UNIT) CAPS capsule, Take 1 capsule (50,000 Units total) by mouth every 7 (seven) days., Disp: 8 capsule, Rfl: 0 No current facility-administered medications for this visit.  Facility-Administered Medications Ordered in Other Visits:    gadopentetate dimeglumine (MAGNEVIST) injection 20 mL, 20 mL, Intravenous, Once PRN, Anson Fret, MD   gadopentetate dimeglumine (MAGNEVIST) injection 20 mL, 20 mL, Intravenous, Once PRN, Anson Fret, MD   Josephine Igo, DO [facility] 07/31/2023 10:56 AM

## 2023-07-31 NOTE — Progress Notes (Signed)
Synopsis: Referred in October 2024 for pulmonary nodules by Elie Confer, NP  Subjective:   PATIENT ID: [name] GENDER: female DOB: [birth year], MRN: [id number]  Chief Complaint  Patient presents with   Consult    Review CT and PET scan    This is a 62 year old female, past medical history of obesity, gastroesophageal reflux, left breast cancer diagnosed in 2020.  Patient had follow-up CT imaging that shows pulmonary nodules.  Subsequent imaging this year showed increase in the size of the nodule she underwent nuclear medicine pet imaging that was concerning for metastatic disease and possible reactive adenopathy within the hilum.  Also small hypermetabolic nodules within the chest wall.  Patient was referred to me for consideration bronchoscopy and biopsy of the enlarging pulmonary nodule in the right lower lobe.    Past Medical History:  Diagnosis Date   Complication of anesthesia    PONV   Fatigue    GERD (gastroesophageal reflux disease)    w nonobstructing esophageal stricture   Headache    Heat intolerance    History of ETT 03/2009   myoview EF 65%, normal wall motion, no ischemia or infarction, poor exercise capacity   HTN (hypertension)    left breast ca dx'd 10/2018   breast- left   Motion sickness    Normal echocardiogram 03/2009   EF 60-65% moderate diastolic dysfunction, mild LAE   Numbness and tingling    Obesity    PONV (postoperative nausea and vomiting)    S/P partial hysterectomy      Family History  Problem Relation Age of Onset   Heart attack Mother 73   Heart disease Mother    Diabetes Sister    Heart attack Maternal Grandmother        age 45 or 74   Heart disease Maternal Grandmother    Neuropathy Neg Hx    Colon cancer Neg Hx    Colon polyps Neg Hx    Esophageal cancer Neg Hx    Stomach cancer Neg Hx    Rectal cancer Neg Hx      Past Surgical History:  Procedure Laterality Date   ABDOMINAL HYSTERECTOMY  2007    partial    AXILLARY LYMPH NODE DISSECTION Left 03/11/2019   Procedure: LEFT AXILLARY LYMPH NODE DISSECTION;  Surgeon: Harriette Bouillon, MD;  Location: [facility];  Service: General;  Laterality: Left;   BREAST LUMPECTOMY WITH RADIOACTIVE SEED AND SENTINEL LYMPH NODE BIOPSY Left 02/22/2019   Procedure: LEFT BREAST LUMPECTOMY x2 WITH RADIOACTIVE SEED AND LEFT AXILLA SEED  GUIDED LYMPH NODE BIOPSY AND LEFT SENTINEL LYMPH NODE MAPPING;  Surgeon: Harriette Bouillon, MD;  Location: [facility];  Service: General;  Laterality: Left;   CARPAL TUNNEL RELEASE Bilateral 2018   FOOT SURGERY Bilateral 2016   hysterectomy (other)     PORT-A-CATH REMOVAL N/A 02/22/2019   Procedure: REMOVAL PORT-A-CATH;  Surgeon: Harriette Bouillon, MD;  Location: [facility];  Service: General;  Laterality: N/A;   PORTACATH PLACEMENT N/A 11/10/2018   Procedure: INSERTION PORT-A-CATH WITH ULTRASOUND;  Surgeon: Harriette Bouillon, MD;  Location: [facility];  Service: General;  Laterality: N/A;   TONSILLECTOMY  1968   UPPER GASTROINTESTINAL ENDOSCOPY      Social History   Socioeconomic History   Marital status: Married    Spouse name: Not on file   Number of children: 2   Years of education: Not on file   Highest education level: Not on file  Occupational History   Occupation: Runner, broadcasting/film/video  Tobacco Use   Smoking status: Never    Passive exposure: Never   Smokeless tobacco: Never  Vaping Use   Vaping status: Never Used  Substance and Sexual Activity   Alcohol use: No   Drug use: No   Sexual activity: Yes    Birth control/protection: Surgical  Other Topics Concern   Not on file  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 w/ her family   Right-handed   Caffeine: 1/2 cup coffee during the school year, tea   Social Determinants of Health   Financial Resource Strain: Not on file  Food Insecurity: Not on file  Transportation Needs: Not on file  Physical Activity: Not on file   Stress: Not on file  Social Connections: Unknown (02/24/2022)   Received from [facility], [facility]   Social Network    Social Network: Not on file  Intimate Partner Violence: Unknown (01/16/2022)   Received from [facility], [facility]   HITS    Physically Hurt: Not on file    Insult or Talk Down To: Not on file    Threaten Physical Harm: Not on file    Scream or Curse: Not on file     No Known Allergies   Outpatient Medications Prior to Visit  Medication Sig Dispense Refill   anastrozole (ARIMIDEX) 1 MG tablet Take 1 tablet (1 mg total) by mouth daily. 90 tablet 3   atorvastatin (LIPITOR) 10 MG tablet Take 1 tablet by mouth daily.     carvedilol (COREG) 6.25 MG tablet TAKE 1 TABLET BY MOUTH TWICE DAILY (Patient taking differently: Take 6.25 mg by mouth 2 (two) times daily with a meal.) 180 tablet 1   lisinopril-hydrochlorothiazide (ZESTORETIC) 10-12.5 MG tablet Take 1 tablet by mouth daily.     pantoprazole (PROTONIX) 40 MG tablet Take 1 tablet (40 mg total) by mouth daily. 90 tablet 1   Vitamin D, Ergocalciferol, (DRISDOL) 1.25 MG (50000 UNIT) CAPS capsule Take 1 capsule (50,000 Units total) by mouth every 7 (seven) days. 8 capsule 0   aspirin EC 81 MG tablet Take 1 tablet (81 mg total) by mouth daily. (Patient not taking: Reported on 07/31/2023) 90 tablet 3   busPIRone (BUSPAR) 10 MG tablet Take 10 mg by mouth 3 (three) times daily as needed. (Patient not taking: Reported on 07/31/2023)     cephALEXin (KEFLEX) 500 MG capsule Take 500 mg by mouth 4 (four) times daily. (Patient not taking: Reported on 05/08/2023)     meloxicam (MOBIC) 15 MG tablet Take 15 mg by mouth daily as needed. (Patient not taking: Reported on 05/08/2023)     methocarbamol (ROBAXIN) 500 MG tablet Take 1,000 mg by mouth 4 (four) times daily as needed. (Patient not taking: Reported on 05/08/2023)     naproxen (NAPROSYN) 500 MG tablet Take 1 tablet (500 mg total) by mouth 2 (two) times daily with a meal.  (Patient not taking: Reported on 07/31/2023) 60 tablet 3   omeprazole (PRILOSEC) 20 MG capsule TAKE 1 CAPSULE BY MOUTH EVERY DAY (Patient not taking: Reported on 07/31/2023) 90 capsule 3   pregabalin (LYRICA) 50 MG capsule TAKE 1 CAPSULE BY MOUTH THREE TIMES A DAY (Patient not taking: Reported on 07/31/2023) 90 capsule 3   Facility-Administered Medications Prior to Visit  Medication Dose Route Frequency Provider Last Rate Last Admin   gadopentetate dimeglumine (MAGNEVIST) injection 20 mL  20 mL Intravenous Once PRN Anson Fret, MD  gadopentetate dimeglumine (MAGNEVIST) injection 20 mL  20 mL Intravenous Once PRN Anson Fret, MD        Review of Systems  Constitutional:  Negative for chills, fever, malaise/fatigue and weight loss.  HENT:  Negative for hearing loss, sore throat and tinnitus.   Eyes:  Negative for blurred vision and double vision.  Respiratory:  Negative for cough, hemoptysis, sputum production, shortness of breath, wheezing and stridor.   Cardiovascular:  Negative for chest pain, palpitations, orthopnea, leg swelling and PND.  Gastrointestinal:  Negative for abdominal pain, constipation, diarrhea, heartburn, nausea and vomiting.  Genitourinary:  Negative for dysuria, hematuria and urgency.  Musculoskeletal:  Negative for joint pain and myalgias.  Skin:  Negative for itching and rash.  Neurological:  Negative for dizziness, tingling, weakness and headaches.  Endo/Heme/Allergies:  Negative for environmental allergies. Does not bruise/bleed easily.  Psychiatric/Behavioral:  Negative for depression. The patient is not nervous/anxious and does not have insomnia.   All other systems reviewed and are negative.    Objective:  Physical Exam Vitals reviewed.  Constitutional:      General: She is not in acute distress.    Appearance: She is well-developed.  HENT:     Head: Normocephalic and atraumatic.  Eyes:     General: No scleral icterus.     Conjunctiva/sclera: Conjunctivae normal.     Pupils: Pupils are equal, round, and reactive to light.  Neck:     Vascular: No JVD.     Trachea: No tracheal deviation.  Cardiovascular:     Rate and Rhythm: Normal rate and regular rhythm.     Heart sounds: Normal heart sounds. No murmur heard. Pulmonary:     Effort: Pulmonary effort is normal. No tachypnea, accessory muscle usage or respiratory distress.     Breath sounds: No stridor. No wheezing, rhonchi or rales.  Abdominal:     General: There is no distension.     Palpations: Abdomen is soft.     Tenderness: There is no abdominal tenderness.  Musculoskeletal:        General: No tenderness.     Cervical back: Neck supple.  Lymphadenopathy:     Cervical: No cervical adenopathy.  Skin:    General: Skin is warm and dry.     Capillary Refill: Capillary refill takes less than 2 seconds.     Findings: No rash.  Neurological:     Mental Status: She is alert and oriented to person, place, and time.  Psychiatric:        Behavior: Behavior normal.      Vitals:   07/31/23 1032  BP: 126/84  Pulse: 84  Temp: 98.3 F (36.8 C)  TempSrc: Oral  SpO2: 100%  Weight: 281 lb 12.8 oz (127.8 kg)  Height: 6\' 2"  (1.88 m)   100% on RA BMI Readings from Last 3 Encounters:  07/31/23 36.18 kg/m  05/08/23 35.44 kg/m  11/12/22 36.08 kg/m   Wt Readings from Last 3 Encounters:  07/31/23 281 lb 12.8 oz (127.8 kg)  05/08/23 276 lb (125.2 kg)  11/12/22 281 lb (127.5 kg)     CBC    Component Value Date/Time   WBC 3.9 (L) 01/30/2021 0818   WBC 4.2 02/16/2019 1439   RBC 4.31 01/30/2021 0818   HGB 12.0 01/30/2021 0818   HGB 12.6 11/12/2016 0830   HCT 37.7 01/30/2021 0818   HCT 40.5 11/12/2016 0830   PLT 158 01/30/2021 0818   PLT 199 11/12/2016 0830   MCV 87.5  01/30/2021 0818   MCV 85 11/12/2016 0830   MCV 84 07/06/2014 2145   MCH 27.8 01/30/2021 0818   MCHC 31.8 01/30/2021 0818   RDW 12.3 01/30/2021 0818   RDW 13.8 11/12/2016 0830    RDW 13.1 07/06/2014 2145   LYMPHSABS 1.1 01/30/2021 0818   MONOABS 0.4 01/30/2021 0818   EOSABS 0.1 01/30/2021 0818   BASOSABS 0.0 01/30/2021 0818     Chest Imaging:  Nuclear medicine pet imaging October 2024: Hypermetabolic small nodules within the right chest, some very small in size.  Also has some hypermetabolic uptake within the right hilum. The patient's images have been independently reviewed by me.    Pulmonary Functions Testing Results:     No data to display          FeNO:   Pathology:   Echocardiogram:   Heart Catheterization:     Assessment & Plan:     ICD-10-CM   1. Lung nodule  R91.1 Procedural/ Surgical Case Request: ROBOTIC ASSISTED NAVIGATIONAL BRONCHOSCOPY, VIDEO BRONCHOSCOPY WITH ENDOBRONCHIAL ULTRASOUND    Ambulatory referral to Pulmonology    CT Super D Chest Wo Contrast    2. History of breast cancer  Z85.3       Discussion:  This is a 62 year old female history of breast cancer.  Found to have other small hypermetabolic nodules in the lungs.  1 increased in size.  Concern for malignancy or metastatic disease.  Plan: Today in the office to about risk-benefit alternatives to consideration bronchoscopy and biopsy. Patient is agreeable to proceed. We talked about the risk of bleeding and pneumothorax associated with robotic assisted navigational bronchoscopy. Will also need a bronchoscopy endobronchial ultrasound to examine the right hilum. Also only depending upon what pathology may show in the right lung nodule. Patient is agreeable to proceed with this plan tentative bronchoscopy date will be on 08/10/2023.     Current Outpatient Medications:    anastrozole (ARIMIDEX) 1 MG tablet, Take 1 tablet (1 mg total) by mouth daily., Disp: 90 tablet, Rfl: 3   atorvastatin (LIPITOR) 10 MG tablet, Take 1 tablet by mouth daily., Disp: , Rfl:    carvedilol (COREG) 6.25 MG tablet, TAKE 1 TABLET BY MOUTH TWICE DAILY (Patient taking differently: Take  6.25 mg by mouth 2 (two) times daily with a meal.), Disp: 180 tablet, Rfl: 1   lisinopril-hydrochlorothiazide (ZESTORETIC) 10-12.5 MG tablet, Take 1 tablet by mouth daily., Disp: , Rfl:    pantoprazole (PROTONIX) 40 MG tablet, Take 1 tablet (40 mg total) by mouth daily., Disp: 90 tablet, Rfl: 1   Vitamin D, Ergocalciferol, (DRISDOL) 1.25 MG (50000 UNIT) CAPS capsule, Take 1 capsule (50,000 Units total) by mouth every 7 (seven) days., Disp: 8 capsule, Rfl: 0 No current facility-administered medications for this visit.  Facility-Administered Medications Ordered in Other Visits:    gadopentetate dimeglumine (MAGNEVIST) injection 20 mL, 20 mL, Intravenous, Once PRN, Anson Fret, MD   gadopentetate dimeglumine (MAGNEVIST) injection 20 mL, 20 mL, Intravenous, Once PRN, Anson Fret, MD   Josephine Igo, DO [facility] 07/31/2023 10:56 AM

## 2023-08-05 ENCOUNTER — Other Ambulatory Visit: Payer: BC Managed Care – PPO

## 2023-08-06 ENCOUNTER — Encounter: Payer: BC Managed Care – PPO | Admitting: Podiatry

## 2023-08-06 ENCOUNTER — Telehealth: Payer: Self-pay | Admitting: Pulmonary Disease

## 2023-08-06 NOTE — Telephone Encounter (Signed)
Spoke to [place] with pre admit testing, she is requesting surgical orders for 10/28 bronch.   Dr. Tonia Brooms, please advise. Thanks

## 2023-08-06 NOTE — Telephone Encounter (Signed)
[name] states needs surgical orders. Patient procedure 08/10/2023. [name] phone number is [phone].

## 2023-08-07 ENCOUNTER — Encounter (HOSPITAL_COMMUNITY): Payer: Self-pay | Admitting: Pulmonary Disease

## 2023-08-07 NOTE — Telephone Encounter (Signed)
Noted.  Left detailed message for pre admit testing.

## 2023-08-07 NOTE — Progress Notes (Signed)
SDW call  Patient was given pre-op instructions over the phone. Patient verbalized understanding of instructions provided.     PCP - Vida Rigger, NP Cardiologist -  Pulmonary:    PPM/ICD - denies Device Orders - na Rep Notified - na   Chest x-ray - 12/27/18 EKG -  DOS, 08/10/2023 Stress Test -02/20/2020 ECHO - 02/22/2020 Cardiac Cath -   Sleep Study/sleep apnea/CPAP: denies  Non-diabetic   Blood Thinner Instructions: denies Aspirin Instructions:denies   ERAS Protcol - NPO  COVID TEST- na    Anesthesia review: No   Patient denies shortness of breath, fever, cough and chest pain over the phone call  Your procedure is scheduled on Monday August 10, 2023  Report to [facility] Main Entrance "A" at  0800  A.M., then check in with the Admitting office.  Call this number if you have problems the morning of surgery:  [phone]   If you have any questions prior to your surgery date call [phone]: Open Monday-Friday 8am-4pm If you experience any cold or flu symptoms such as cough, fever, chills, shortness of breath, etc. between now and your scheduled surgery, please notify us at the above number    Remember:  Do not eat or drink after midnight the night before your surgery  Take these medicines the morning of surgery with A SIP OF WATER:  Anastrozole, atorvastatin, carvedilol, protonix  As of today, STOP taking any Aspirin (unless otherwise instructed by your surgeon) Aleve, Naproxen, Ibuprofen, Motrin, Advil, Goody's, BC's, all herbal medications, fish oil, and all vitamins.

## 2023-08-10 ENCOUNTER — Encounter (HOSPITAL_COMMUNITY)
Admission: RE | Disposition: A | Discharge status: Home / Self Care | Payer: Self-pay | Source: Home / Self Care | Attending: Pulmonary Disease

## 2023-08-10 ENCOUNTER — Encounter: Payer: Self-pay | Admitting: *Deleted

## 2023-08-10 ENCOUNTER — Ambulatory Visit (HOSPITAL_COMMUNITY)
Admission: RE | Admit: 2023-08-10 | Discharge: 2023-08-10 | Disposition: A | Discharge status: Home / Self Care | Payer: BC Managed Care – PPO | Attending: Pulmonary Disease | Admitting: Pulmonary Disease

## 2023-08-10 ENCOUNTER — Ambulatory Visit (HOSPITAL_COMMUNITY): Payer: BC Managed Care – PPO

## 2023-08-10 ENCOUNTER — Ambulatory Visit (HOSPITAL_COMMUNITY): Payer: BC Managed Care – PPO | Admitting: Anesthesiology

## 2023-08-10 ENCOUNTER — Encounter (HOSPITAL_COMMUNITY): Payer: Self-pay | Admitting: Pulmonary Disease

## 2023-08-10 DIAGNOSIS — Z48813 Encounter for surgical aftercare following surgery on the respiratory system: Secondary | ICD-10-CM | POA: Diagnosis not present

## 2023-08-10 DIAGNOSIS — K219 Gastro-esophageal reflux disease without esophagitis: Secondary | ICD-10-CM | POA: Diagnosis not present

## 2023-08-10 DIAGNOSIS — Z6835 Body mass index (BMI) 35.0-35.9, adult: Secondary | ICD-10-CM | POA: Diagnosis not present

## 2023-08-10 DIAGNOSIS — I10 Essential (primary) hypertension: Secondary | ICD-10-CM | POA: Diagnosis not present

## 2023-08-10 DIAGNOSIS — C7801 Secondary malignant neoplasm of right lung: Secondary | ICD-10-CM | POA: Insufficient documentation

## 2023-08-10 DIAGNOSIS — Z853 Personal history of malignant neoplasm of breast: Secondary | ICD-10-CM | POA: Diagnosis not present

## 2023-08-10 DIAGNOSIS — R918 Other nonspecific abnormal finding of lung field: Secondary | ICD-10-CM | POA: Diagnosis not present

## 2023-08-10 DIAGNOSIS — R911 Solitary pulmonary nodule: Secondary | ICD-10-CM | POA: Diagnosis present

## 2023-08-10 DIAGNOSIS — E669 Obesity, unspecified: Secondary | ICD-10-CM | POA: Diagnosis not present

## 2023-08-10 DIAGNOSIS — C50919 Malignant neoplasm of unspecified site of unspecified female breast: Secondary | ICD-10-CM | POA: Diagnosis not present

## 2023-08-10 DIAGNOSIS — Z170 Estrogen receptor positive status [ER+]: Secondary | ICD-10-CM | POA: Diagnosis not present

## 2023-08-10 HISTORY — PX: BRONCHIAL BIOPSY: SHX5109

## 2023-08-10 LAB — CBC
HCT: 36.7 % (ref 36.0–46.0)
Hemoglobin: 11.5 g/dL — ABNORMAL LOW (ref 12.0–15.0)
MCH: 27.7 pg (ref 26.0–34.0)
MCHC: 31.3 g/dL (ref 30.0–36.0)
MCV: 88.4 fL (ref 80.0–100.0)
Platelets: 157 10*3/uL (ref 150–400)
RBC: 4.15 MIL/uL (ref 3.87–5.11)
RDW: 12.2 % (ref 11.5–15.5)
WBC: 4 10*3/uL (ref 4.0–10.5)
nRBC: 0 % (ref 0.0–0.2)

## 2023-08-10 LAB — BASIC METABOLIC PANEL
Anion gap: 7 (ref 5–15)
BUN: 16 mg/dL (ref 8–23)
CO2: 25 mmol/L (ref 22–32)
Calcium: 8.9 mg/dL (ref 8.9–10.3)
Chloride: 105 mmol/L (ref 98–111)
Creatinine, Ser: 0.97 mg/dL (ref 0.44–1.00)
GFR, Estimated: >60 mL/min (ref >60)
Glucose, Bld: 94 mg/dL (ref 70–99)
Potassium: 3.5 mmol/L (ref 3.5–5.1)
Sodium: 137 mmol/L (ref 135–145)

## 2023-08-10 SURGERY — BRONCHOSCOPY, WITH BIOPSY USING ELECTROMAGNETIC NAVIGATION
Anesthesia: General | Laterality: Bilateral

## 2023-08-10 MED ORDER — FENTANYL CITRATE (PF) 100 MCG/2ML IJ SOLN
INTRAMUSCULAR | Status: AC
  Filled 2023-08-10: qty 2

## 2023-08-10 MED ORDER — OXYCODONE HCL 5 MG/5ML PO SOLN: 5.0000 mg | Freq: Once | ORAL | Status: DC | PRN

## 2023-08-10 MED ORDER — PROPOFOL 10 MG/ML IV BOLUS
INTRAVENOUS | Status: DC | PRN
  Administered 2023-08-10: 150 mg via INTRAVENOUS

## 2023-08-10 MED ORDER — CHLORHEXIDINE GLUCONATE 0.12 % MT SOLN: 15.0000 mL | Freq: Once | OROMUCOSAL | Status: AC

## 2023-08-10 MED ORDER — CHLORHEXIDINE GLUCONATE 0.12 % MT SOLN
OROMUCOSAL | Status: AC
  Administered 2023-08-10: 15 mL via OROMUCOSAL
  Filled 2023-08-10: qty 15

## 2023-08-10 MED ORDER — FENTANYL CITRATE (PF) 100 MCG/2ML IJ SOLN: 25.0000 ug | INTRAMUSCULAR | Status: DC | PRN

## 2023-08-10 MED ORDER — OXYCODONE HCL 5 MG PO TABS: 5.0000 mg | ORAL_TABLET | Freq: Once | ORAL | Status: DC | PRN

## 2023-08-10 MED ORDER — ROCURONIUM BROMIDE 10 MG/ML (PF) SYRINGE
PREFILLED_SYRINGE | INTRAVENOUS | Status: DC | PRN
  Administered 2023-08-10: 60 mg via INTRAVENOUS

## 2023-08-10 MED ORDER — ONDANSETRON HCL 4 MG/2ML IJ SOLN: 4.0000 mg | Freq: Once | INTRAMUSCULAR | Status: DC | PRN

## 2023-08-10 MED ORDER — LACTATED RINGERS IV SOLN: INTRAVENOUS | Status: DC

## 2023-08-10 MED ORDER — AMISULPRIDE (ANTIEMETIC) 5 MG/2ML IV SOLN: 10.0000 mg | Freq: Once | INTRAVENOUS | Status: DC | PRN

## 2023-08-10 MED ORDER — PROPOFOL 500 MG/50ML IV EMUL
INTRAVENOUS | Status: DC | PRN
  Administered 2023-08-10: 130 ug/kg/min via INTRAVENOUS

## 2023-08-10 MED ORDER — FENTANYL CITRATE (PF) 250 MCG/5ML IJ SOLN
INTRAMUSCULAR | Status: DC | PRN
  Administered 2023-08-10: 50 ug via INTRAVENOUS

## 2023-08-10 MED ORDER — ONDANSETRON HCL 4 MG/2ML IJ SOLN
INTRAMUSCULAR | Status: DC | PRN
  Administered 2023-08-10: 4 mg via INTRAVENOUS

## 2023-08-10 MED ORDER — SUGAMMADEX SODIUM 200 MG/2ML IV SOLN
INTRAVENOUS | Status: DC | PRN
  Administered 2023-08-10: 200 mg via INTRAVENOUS

## 2023-08-10 MED ORDER — LIDOCAINE 2% (20 MG/ML) 5 ML SYRINGE
INTRAMUSCULAR | Status: DC | PRN
  Administered 2023-08-10: 80 mg via INTRAVENOUS

## 2023-08-10 SURGICAL SUPPLY — 29 items
BRUSH CYTOL CELLEBRITY 1.5X140 (MISCELLANEOUS) IMPLANT
CANISTER SUCT 3000ML PPV (MISCELLANEOUS) ×3 IMPLANT
CONT SPEC 4OZ CLIKSEAL STRL BL (MISCELLANEOUS) ×3 IMPLANT
COVER BACK TABLE 60X90IN (DRAPES) ×3 IMPLANT
COVER DOME SNAP 22 D (MISCELLANEOUS) ×3 IMPLANT
FORCEPS BIOP RJ4 1.8 (CUTTING FORCEPS) IMPLANT
GAUZE SPONGE 4X4 12PLY STRL (GAUZE/BANDAGES/DRESSINGS) ×3 IMPLANT
GLOVE BIO SURGEON STRL SZ7.5 (GLOVE) ×3 IMPLANT
GOWN STRL REUS W/ TWL LRG LVL3 (GOWN DISPOSABLE) ×3 IMPLANT
GOWN STRL REUS W/TWL LRG LVL3 (GOWN DISPOSABLE) ×3
KIT CLEAN ENDO COMPLIANCE (KITS) ×6 IMPLANT
KIT TURNOVER KIT B (KITS) ×3 IMPLANT
MARKER SKIN DUAL TIP RULER LAB (MISCELLANEOUS) ×3 IMPLANT
NDL EBUS SONO TIP PENTAX (NEEDLE) ×2 IMPLANT
NEEDLE EBUS SONO TIP PENTAX (NEEDLE) ×3
NS IRRIG 1000ML POUR BTL (IV SOLUTION) ×3 IMPLANT
OIL SILICONE PENTAX (PARTS (SERVICE/REPAIRS)) ×3 IMPLANT
PAD ARMBOARD 7.5X6 YLW CONV (MISCELLANEOUS) ×6 IMPLANT
SOL ANTI FOG 6CC (MISCELLANEOUS) ×3 IMPLANT
SYR 20CC LL (SYRINGE) ×6 IMPLANT
SYR 20ML ECCENTRIC (SYRINGE) ×6 IMPLANT
SYR 50ML SLIP (SYRINGE) IMPLANT
SYR 5ML LUER SLIP (SYRINGE) ×3 IMPLANT
TOWEL OR 17X24 6PK STRL BLUE (TOWEL DISPOSABLE) ×3 IMPLANT
TRAP SPECIMEN MUCOUS 40CC (MISCELLANEOUS) IMPLANT
TUBE CONNECTING 20X1/4 (TUBING) ×6 IMPLANT
UNDERPAD 30X30 (UNDERPADS AND DIAPERS) ×3 IMPLANT
VALVE DISPOSABLE (MISCELLANEOUS) ×3 IMPLANT
WATER STERILE IRR 1000ML POUR (IV SOLUTION) ×3 IMPLANT

## 2023-08-10 NOTE — Progress Notes (Signed)
Per MD request RN placed call to [facility] Pathology to add breast prognostic panel to bronch that was preformed today.  [name] with pathology verbalized understanding and states panel will be added.

## 2023-08-10 NOTE — Interval H&P Note (Signed)
History and Physical Interval Note:  08/10/2023 9:53 AM  [name]  has presented today for surgery, with the diagnosis of lung nodule.  The various methods of treatment have been discussed with the patient and family. After consideration of risks, benefits and other options for treatment, the patient has consented to  Procedure(s): ROBOTIC ASSISTED NAVIGATIONAL BRONCHOSCOPY (Bilateral) VIDEO BRONCHOSCOPY WITH ENDOBRONCHIAL ULTRASOUND (Right) as a surgical intervention.  The patient's history has been reviewed, patient examined, no change in status, stable for surgery.  I have reviewed the patient's chart and labs.  Questions were answered to the patient's satisfaction.     [name]

## 2023-08-10 NOTE — Anesthesia Postprocedure Evaluation (Signed)
Anesthesia Post Note  Patient: [name] [facility]  Procedure(s) Performed: ROBOTIC ASSISTED NAVIGATIONAL BRONCHOSCOPY (Bilateral) BRONCHIAL BIOPSIES     Patient location during evaluation: PACU Anesthesia Type: General Level of consciousness: awake and alert and oriented Pain management: pain level controlled Vital Signs Assessment: post-procedure vital signs reviewed and stable Respiratory status: spontaneous breathing, nonlabored ventilation and respiratory function stable Cardiovascular status: blood pressure returned to baseline and stable Postop Assessment: no apparent nausea or vomiting Anesthetic complications: no   There were no known notable events for this encounter.  Last Vitals:  Vitals:   08/10/23 1130 08/10/23 1145  BP: (!) 129/92 128/88  Pulse: 89 79  Resp: 20 20  Temp:  36.9 C  SpO2: 98% 98%    Last Pain:  Vitals:   08/10/23 1145  TempSrc:   PainSc: 0-No pain                 Elvan Ebron A.

## 2023-08-10 NOTE — Anesthesia Preprocedure Evaluation (Signed)
Anesthesia Evaluation  Patient identified by MRN, date of birth, ID band Patient awake    Reviewed: Allergy & Precautions, NPO status , Patient's Chart, lab work & pertinent test results, reviewed documented beta blocker date and time   History of Anesthesia Complications (+) PONV and history of anesthetic complications  Airway Mallampati: II  TM Distance: >3 FB     Dental no notable dental hx. (+) Teeth Intact, Caps, Dental Advisory Given   Pulmonary  Pulmonary nodule   Pulmonary exam normal breath sounds clear to auscultation       Cardiovascular hypertension, Pt. on medications and Pt. on home beta blockers Normal cardiovascular exam(-) dysrhythmias  Rhythm:Regular Rate:Normal  EKG 10/28 NSR  Echo 11/05/18 - Left ventricle: The cavity size was normal. Wall thickness was    normal. Systolic function was normal. The estimated ejection    fraction was in the range of 55% to 60%. Wall motion was normal;    there were no regional wall motion abnormalities. Left    ventricular diastolic function parameters were normal.  - Left atrium: The atrium was mildly dilated.  - Atrial septum: No defect or patent foramen ovale was identified.     Neuro/Psych  Headaches  Neuromuscular disease  negative psych ROS   GI/Hepatic Neg liver ROS,GERD  Medicated,,Hx/o esophageal stricture   Endo/Other  Obesity Hyperlipidemia Hx/o left breast Ca S/P lumpectomy with AND  Renal/GU negative Renal ROS  negative genitourinary   Musculoskeletal negative musculoskeletal ROS (+)    Abdominal   Peds  Hematology negative hematology ROS (+)   Anesthesia Other Findings   Reproductive/Obstetrics                              Anesthesia Physical Anesthesia Plan  ASA: 2  Anesthesia Plan: General   Post-op Pain Management: Minimal or no pain anticipated   Induction: Intravenous  PONV Risk Score and Plan: 4 or  greater and Treatment may vary due to age or medical condition, TIVA, Ondansetron and Dexamethasone  Airway Management Planned: Oral ETT  Additional Equipment: None  Intra-op Plan:   Post-operative Plan: Extubation in OR  Informed Consent: I have reviewed the patients History and Physical, chart, labs and discussed the procedure including the risks, benefits and alternatives for the proposed anesthesia with the patient or authorized representative who has indicated his/her understanding and acceptance.     Dental advisory given  Plan Discussed with:   Anesthesia Plan Comments:          Anesthesia Quick Evaluation

## 2023-08-10 NOTE — Anesthesia Procedure Notes (Signed)
Procedure Name: Intubation Date/Time: 08/10/2023 10:17 AM  Performed by: Allyn Kenner, CRNAPre-anesthesia Checklist: Patient identified, Emergency Drugs available, Suction available and Patient being monitored Patient Re-evaluated:Patient Re-evaluated prior to induction Oxygen Delivery Method: Circle System Utilized Preoxygenation: Pre-oxygenation with 100% oxygen Induction Type: IV induction Ventilation: Mask ventilation without difficulty Laryngoscope Size: Mac and 3 Grade View: Grade I Tube type: Oral Tube size: 8.5 mm Number of attempts: 1 Airway Equipment and Method: Stylet and Oral airway Placement Confirmation: ETT inserted through vocal cords under direct vision, positive ETCO2 and breath sounds checked- equal and bilateral Secured at: 21 cm Tube secured with: Tape Dental Injury: Teeth and Oropharynx as per pre-operative assessment

## 2023-08-10 NOTE — Research (Signed)
Title: A multi-center, prospective, single-arm, observational study to evaluate real-world outcomes for the shape-sensing Ion endoluminal system  Primary Outcome: Evaluate procedure characteristics and short and long-term patient outcomes following shape-sensing robotic-assisted bronchoscopy (ssRAB) utilizing the Ion Endoluminal System for lung lesion localization or biopsy.   Protocol # / Study Name: ISI-ION-003 Clinical Trials #: [id number] Sponsor: [employer] Principal Investigator: Dr. Elige Radon Icard   Key Features of Ion Endoluminal System (referred to as "Ion") Ion is the first FDA cleared bronchoscopy system that uses fiber optic shape sensing technology to inform on location within the airways. Its catheter/tool channel has a smaller outer diameter (3.5 mm) in comparison to conventional bronchoscopes, allowing it to navigate into the smaller airways of the periphery.     Key Inclusion Criteria Subject is 18 years or older at the time of the procedure Subject is a candidate for a planned, elective RAB lung lesion localization or biopsy procedure in which the Ion Endoluminal System is planned to be utilized.  Subject  able to understand and adhere to study requirements and provide informed consent.   Key Exclusion Criteria Subject is under the care of a Museum/gallery exhibitions officer and is unable to provide informed consent on their own accord.  Subject is participating in an interventional research study or research study with investigational agents with an unknown safety profile that would interfere with participation or the results of this study.  Female subjects who are pregnant or nursing at the time of the index Ion procedure, as determined by standard site practices. Subjects that are incarcerated or institutionalized under court order, or other vulnerable populations.    Previous Clinical Trials Since receiving FDA clearance in Feb 2019, Ion has been adopted  commercially by over 226 centers in the [place], and utilized in over 40,000 procedures.  The first in-human study enrolled 38 subjects with a mean lesion size of 14.8 mm and the overall diagnostic yield was 79.3%, with no adverse events. 17 (58.6%) lesions were reported to have a bronchus sign available on CT imaging.  A multi-center study published results in 2022, with 270 lesions biopsied in 241 patients using Ion. The mean largest cardinal lesion size was 18.86.31mm, and the mean airway generation count was 7.01.6. Asymptomatic pneumothorax occurred in 3.3% of subjects, and 0.8% experienced airway bleeding.   Another study provided preliminary results in 2022, with 87% sensitivity for malignancy, a diagnostic yield of 81%, and a mean lesion size of 16 mm. 75% of biopsy cases were bronchus-sign negative. 4% of subjects experienced pneumothoraces (including those requiring intervention), and 0.8% of subjects experienced airway bleeding requiring wedging or balloon tamponade.  A single-center study captured 131 consecutive procedures of pulmonary biopsy using Ion. The navigational success rate was 98.7%, with an overall diagnostic yield of 81.7%, an overall complication rate of 3%, and a pneumothorax rate of 1.5%.    [facility] [place] Clinical Research Coordinator note:   This visit for Subject [name] [facility] with DOB: [birth year] on 08/10/2023 for the above protocol is Visit/Encounter # Pre-procedure, Intra-procedure and Post-procedure, and is for purpose of research.   The consent for this encounter is under:  Protocol Version 1.0 Investigator Brochure Version N/A Consent Version Revision A, dated 14Nov2023 and is currently IRB approved.   [name] expressed continued interest and consent in continuing as a study subject. Subject confirmed that there was no change in contact information (e.g. address, telephone, email). Subject thanked for participation in research and  contribution to science.  In this visit 08/10/2023 the subject will be evaluated by Principal Investigator named Dr. Tonia Brooms. This research coordinator has verified that the above investigator is up to date with his/her training logs.   The Subject was informed that the PI continues to have oversight of the subject's visits and course through relevant discussions, reviews, and also specifically of this visit by routing of this note to the PI.  The research study was discussed with the subject in the pre-operative room. The study was explained in detail including all the contents of the informed consent document. The subject was encouraged to ask questions. All questions were answered to their satisfaction. The IRB approved informed consent was signed, and a copy was given to the subject. After obtaining consent, the subject underwent scheduled procedure using the ion endoluminal system. Data collection was completed per protocol. Refer to paper source subject binder for further details.      Signed by  [name] Clinical Research Coordinator / Sub-Investigator  [facility]  [place], [place] 10:51 AM 08/10/2023

## 2023-08-10 NOTE — Transfer of Care (Signed)
Immediate Anesthesia Transfer of Care Note  Patient: [name] [facility]  Procedure(s) Performed: ROBOTIC ASSISTED NAVIGATIONAL BRONCHOSCOPY (Bilateral) BRONCHIAL BIOPSIES  Patient Location: PACU  Anesthesia Type:General  Level of Consciousness: awake, alert , and oriented  Airway & Oxygen Therapy: Patient Spontanous Breathing and Patient connected to face mask oxygen  Post-op Assessment: Report given to RN and Post -op Vital signs reviewed and stable  Post vital signs: Reviewed and stable  Last Vitals:  Vitals Value Taken Time  BP 119/88 08/10/23 1102  Temp    Pulse 98 08/10/23 1106  Resp 30 08/10/23 1106  SpO2 95 % 08/10/23 1106  Vitals shown include unfiled device data.  Last Pain:  Vitals:   08/10/23 0906  TempSrc:   PainSc: 0-No pain         Complications: No notable events documented.

## 2023-08-10 NOTE — Discharge Instructions (Signed)
Flexible Bronchoscopy, Care After This sheet gives you information about how to care for yourself after your test. Your doctor may also give you more specific instructions. If you have problems or questions, contact your doctor. Follow these instructions at home: Eating and drinking Do not eat or drink anything (not even water) for 2 hours after your test, or until your numbing medicine (local anesthetic) wears off. When your numbness is gone and your cough and gag reflexes have come back, you may: Eat only soft foods. Slowly drink liquids. The day after the test, go back to your normal diet. Driving Do not drive for 24 hours if you were given a medicine to help you relax (sedative). Do not drive or use heavy machinery while taking prescription pain medicine. General instructions  Take over-the-counter and prescription medicines only as told by your doctor. Return to your normal activities as told. Ask what activities are safe for you. Do not use any products that have nicotine or tobacco in them. This includes cigarettes and e-cigarettes. If you need help quitting, ask your doctor. Keep all follow-up visits as told by your doctor. This is important. It is very important if you had a tissue sample (biopsy) taken. Get help right away if: You have shortness of breath that gets worse. You get light-headed. You feel like you are going to pass out (faint). You have chest pain. You cough up: More than a little blood. More blood than before. Summary Do not eat or drink anything (not even water) for 2 hours after your test, or until your numbing medicine wears off. Do not use cigarettes. Do not use e-cigarettes. Get help right away if you have chest pain.  This information is not intended to replace advice given to you by your health care provider. Make sure you discuss any questions you have with your health care provider. Document Released: 07/27/2009 Document Revised: 09/11/2017 Document  Reviewed: 10/17/2016 Elsevier Patient Education  2020 Elsevier Inc.  

## 2023-08-10 NOTE — Op Note (Signed)
Video Bronchoscopy with Robotic Assisted Bronchoscopic Navigation   Date of Operation: 08/10/2023   Pre-op Diagnosis: Pulmonary nodules  Post-op Diagnosis: Pulmonary nodules  Surgeon: Josephine Igo, DO   Assistants: None   Anesthesia: General endotracheal anesthesia  Operation: Flexible video fiberoptic bronchoscopy with robotic assistance and biopsies.  Estimated Blood Loss: Minimal  Complications: None  Indications and History: [name] is a 62 y.o. female with history of pulmonary nodules. The risks, benefits, complications, treatment options and expected outcomes were discussed with the patient.  The possibilities of pneumothorax, pneumonia, reaction to medication, pulmonary aspiration, perforation of a viscus, bleeding, failure to diagnose a condition and creating a complication requiring transfusion or operation were discussed with the patient who freely signed the consent.    Description of Procedure: The patient was seen in the Preoperative Area, was examined and was deemed appropriate to proceed.  The patient was taken to [facility] endoscopy room 3, identified as [name] and the procedure verified as Flexible Video Fiberoptic Bronchoscopy.  A Time Out was held and the above information confirmed.   Prior to the date of the procedure a high-resolution CT scan of the chest was performed. Utilizing ION software program a virtual tracheobronchial tree was generated to allow the creation of distinct navigation pathways to the patient's parenchymal abnormalities. After being taken to the operating room general anesthesia was initiated and the patient  was orally intubated. The video fiberoptic bronchoscope was introduced via the endotracheal tube and a general inspection was performed which showed normal right and left lung anatomy, aspiration of the bilateral mainstems was completed to remove any remaining secretions. Robotic catheter inserted into patient's  endotracheal tube.   Target #1 right lower lobe: The distinct navigation pathways prepared prior to this procedure were then utilized to navigate to patient's lesion identified on CT scan. The robotic catheter was secured into place and the vision probe was withdrawn.  Lesion location was approximated using fluoroscopy and three-dimensional cone beam CT imaging for CT-guided needle placement and for peripheral targeting. Under fluoroscopic guidance transbronchial forceps biopsies were performed to be sent for cytology and pathology.   At the end of the procedure a general airway inspection was performed and there was no evidence of active bleeding. The bronchoscope was removed.  The patient tolerated the procedure well. There was no significant blood loss and there were no obvious complications. A post-procedural chest x-ray is pending.  Samples Target #1: 1. Transbronchial forceps biopsies from RLL  Plans:  The patient will be discharged from the PACU to home when recovered from anesthesia and after chest x-ray is reviewed. We will review the cytology, pathology results with the patient when they become available. Outpatient followup will be with Tammy Robinsons, NP.  Josephine Igo, DO [facility] 08/10/2023 10:56 AM

## 2023-08-11 ENCOUNTER — Encounter (HOSPITAL_COMMUNITY): Payer: Self-pay | Admitting: Pulmonary Disease

## 2023-08-12 LAB — CYTOLOGY - NON PAP

## 2023-08-14 ENCOUNTER — Telehealth: Payer: Self-pay

## 2023-08-14 ENCOUNTER — Telehealth: Payer: Self-pay | Admitting: Pharmacy Technician

## 2023-08-14 ENCOUNTER — Encounter: Payer: Self-pay | Admitting: Acute Care

## 2023-08-14 ENCOUNTER — Inpatient Hospital Stay: Payer: BC Managed Care – PPO | Attending: Hematology and Oncology | Admitting: Hematology and Oncology

## 2023-08-14 ENCOUNTER — Ambulatory Visit (INDEPENDENT_AMBULATORY_CARE_PROVIDER_SITE_OTHER): Payer: BC Managed Care – PPO | Admitting: Acute Care

## 2023-08-14 ENCOUNTER — Other Ambulatory Visit (HOSPITAL_COMMUNITY): Payer: Self-pay

## 2023-08-14 VITALS — BP 126/72 | HR 83 | Temp 98.2°F | Ht 74.0 in

## 2023-08-14 DIAGNOSIS — Z79811 Long term (current) use of aromatase inhibitors: Secondary | ICD-10-CM | POA: Insufficient documentation

## 2023-08-14 DIAGNOSIS — Z853 Personal history of malignant neoplasm of breast: Secondary | ICD-10-CM | POA: Diagnosis not present

## 2023-08-14 DIAGNOSIS — C3431 Malignant neoplasm of lower lobe, right bronchus or lung: Secondary | ICD-10-CM | POA: Diagnosis not present

## 2023-08-14 DIAGNOSIS — Z170 Estrogen receptor positive status [ER+]: Secondary | ICD-10-CM | POA: Insufficient documentation

## 2023-08-14 DIAGNOSIS — E559 Vitamin D deficiency, unspecified: Secondary | ICD-10-CM | POA: Diagnosis not present

## 2023-08-14 DIAGNOSIS — C50412 Malignant neoplasm of upper-outer quadrant of left female breast: Secondary | ICD-10-CM | POA: Diagnosis not present

## 2023-08-14 DIAGNOSIS — C7800 Secondary malignant neoplasm of unspecified lung: Secondary | ICD-10-CM | POA: Insufficient documentation

## 2023-08-14 DIAGNOSIS — Z79899 Other long term (current) drug therapy: Secondary | ICD-10-CM | POA: Diagnosis not present

## 2023-08-14 DIAGNOSIS — Z923 Personal history of irradiation: Secondary | ICD-10-CM | POA: Insufficient documentation

## 2023-08-14 MED ORDER — LETROZOLE 2.5 MG PO TABS: 2.5000 mg | ORAL_TABLET | Freq: Every day | ORAL | 3 refills | Status: DC

## 2023-08-14 MED ORDER — ABEMACICLIB 100 MG PO TABS: 100.0000 mg | ORAL_TABLET | Freq: Two times a day (BID) | ORAL | 3 refills | Status: DC

## 2023-08-14 NOTE — Telephone Encounter (Addendum)
Oral Oncology Patient Advocate Encounter  Prior Authorization for Kathlen Mody has been approved under [employer].  PA# [id number] Effective dates: 08/14/23 through 08/13/24  Patients co-pay is $3,420.54.   Prior Authorization for Kathlen Mody has been approved under [employer]	[place]  PA# [id number] Effective dates: 08/14/23 through 08/13/24  Patient must fill at [facility].   Patient is eligible for a copay card on both plans.  Jinger Neighbors, CPhT-Adv Oncology Pharmacy Patient Advocate [facility] Direct Number: [phone]  Fax: [phone]

## 2023-08-14 NOTE — Assessment & Plan Note (Signed)
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  [facility] L&D 03/11/2019: 3/14 lymph nodes positive with focal extranodal involvement [id number]   Treatment plan: 1.  Neoadjuvant chemotherapy with dose dense Adriamycin and Cytoxan every 2 weeks x4 followed by Taxol weekly x 3 discontinued due to severe peripheral neuropathy completed 01/20/2019 2.  Followed by mastectomy on 02/22/2019 3.  Followed by radiation 04/20/2019-06/02/2019 4.  Followed by antiestrogen therapy. 06/02/2019 5. 07/15/22: Lung nodules on CT scan. 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 PET CT 07/31/23: 3 small Rt pleural based nodules hypermetabolic, hypermet Rt hilar LN -----------------------------------------------------------------------------------

## 2023-08-14 NOTE — Progress Notes (Signed)
Patient Care Team: Elie Confer, NP as PCP - Earma Reading, MD as Consulting Physician (General Surgery) Serena Croissant, MD as Consulting Physician (Hematology and Oncology) Dorothy Puffer, MD as Consulting Physician (Radiation Oncology)  DIAGNOSIS:  Encounter Diagnosis  Name Primary?   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, lymph node biopsy positive, intramammary lymph node biopsy negative, ER 100%, PR 100%, Ki-67 20%, HER-2 1+ by IHC, T2 and 1 stage 2A   10/27/2018 Cancer Staging   Staging form: Breast, AJCC 8th Edition - Clinical: Stage IIA (cT2, cN1(f), cM0, G1, ER+, PR+, HER2-) - Signed by Serena Croissant, MD on 11/01/2018   11/11/2018 - 12/20/2018 Neo-Adjuvant Chemotherapy   Neoadjuvant chemotherapy with dose dense Adriamycin and Cytoxan every 2 weeks x4 followed by Taxol weekly x12 (stopped after cycle 3 due to severe neuropathy)   02/01/2019 Breast MRI   Known malignancy in left breast measures 4.7 x 3.5 x 7.2cm, previously 5 x 3 x 10cm.    02/22/2019 Surgery   Left lumpectomy ([facility]): IDC with DCIS, 5.8cm, HER2 negative, ER 90%, PR 90%, Ki67 2%, clear margins,3/5 LN positive for malignancy.    03/02/2019 Cancer Staging   Staging form: Breast, AJCC 8th Edition - Pathologic stage from 03/02/2019: No Stage Recommended (ypT3, pN1a, cM0, G2, ER+, PR+, HER2-) - Signed by Serena Croissant, MD on 03/02/2019   03/11/2019 Surgery   Left axillary lymph node dissection ([facility]): metastatic carcinoma in 3/14 lymph nodes,  focal extranodal involvement by tumor.   04/20/2019 - 06/02/2019 Radiation Therapy   Adjuvant radiation therapy   05/30/2019 -  Anti-estrogen oral therapy   Anastrozole 1 mg daily     CHIEF COMPLIANT: Follow-up to discuss results of lung biopsy  History of Present Illness   The patient, with a history of breast cancer, presents with a recent diagnosis of metastatic breast cancer in the lung. The cancer is estrogen receptor positive, despite the patient's ongoing treatment with anastrozole. The patient has been diligent in taking the anastrozole and has not missed any doses. The patient also reports a vitamin D deficiency and is scheduled for foot surgery. The patient has been managing these conditions with the help of her spouse.        ALLERGIES:  has No Known Allergies.  MEDICATIONS:  Current Outpatient Medications  Medication Sig Dispense Refill   abemaciclib (VERZENIO) 100 MG tablet Take 1 tablet (100 mg total) by mouth 2 (two) times daily. 60 tablet 3   atorvastatin (LIPITOR) 10 MG tablet Take 1 tablet by mouth daily.     carvedilol (COREG) 6.25 MG tablet TAKE 1 TABLET BY MOUTH TWICE DAILY (Patient taking differently: Take 6.25 mg by mouth 2 (two) times daily with a meal.) 180 tablet 1   letrozole (FEMARA) 2.5 MG tablet Take 1 tablet (2.5 mg total) by mouth daily. 90 tablet 3   lisinopril-hydrochlorothiazide (ZESTORETIC) 10-12.5 MG tablet Take 1 tablet by mouth daily.     pantoprazole (PROTONIX) 40 MG tablet Take 1 tablet (40 mg total) by mouth daily. 90 tablet 1   Vitamin D, Ergocalciferol, (DRISDOL)  1.25 MG (50000 UNIT) CAPS capsule Take 1 capsule (50,000 Units total) by mouth every 7 (seven) days. 8 capsule 0   No current facility-administered medications for this visit.   Facility-Administered Medications Ordered in Other Visits  Medication Dose Route Frequency Provider Last Rate Last Admin   gadopentetate dimeglumine (MAGNEVIST) injection 20 mL  20 mL Intravenous Once PRN  Anson Fret, MD       gadopentetate dimeglumine (MAGNEVIST) injection 20 mL  20 mL Intravenous Once PRN Anson Fret, MD        PHYSICAL EXAMINATION: ECOG PERFORMANCE STATUS: 1 - Symptomatic but completely ambulatory  Vitals:   08/14/23 1232  BP: 134/78  Pulse: 84  Resp: 16  Temp: 97.9 F (36.6 C)  SpO2: 100%   Filed Weights   08/14/23 1232  Weight: 285 lb 9.6 oz (129.5 kg)      LABORATORY DATA:  I have reviewed the data as listed    Latest Ref Rng & Units 08/10/2023    9:03 AM 07/16/2023   12:37 PM 09/06/2021    7:54 AM  CMP  Glucose 70 - 99 mg/dL 94     BUN 8 - 23 mg/dL 16     Creatinine 8.65 - 1.00 mg/dL 7.84   6.96   Sodium 295 - 145 mmol/L 137     Potassium 3.5 - 5.1 mmol/L 3.5     Chloride 98 - 111 mmol/L 105     CO2 22 - 32 mmol/L 25     Calcium 8.9 - 10.3 mg/dL 8.9  9.5      Lab Results  Component Value Date   WBC 4.0 08/10/2023   HGB 11.5 (L) 08/10/2023   HCT 36.7 08/10/2023   MCV 88.4 08/10/2023   PLT 157 08/10/2023   NEUTROABS 2.4 01/30/2021    ASSESSMENT & PLAN:  Malignant neoplasm of upper-outer quadrant of left breast in female, estrogen receptor positive (HCC) 10/22/2018: Screening detected left breast architectural distortion: 4.5 cm, UOQ, by ultrasound measured 5 cm at 1 o'clock position multiple enlarged lymph nodes, biopsy revealed 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  AX L&D 03/11/2019: 3/14 lymph nodes positive with focal extranodal involvement [id number]   Treatment plan: 1.  Neoadjuvant chemotherapy with dose dense  Adriamycin and Cytoxan every 2 weeks x4 followed by Taxol weekly x 3 discontinued due to severe peripheral neuropathy completed 01/20/2019 2.  Followed by mastectomy on 02/22/2019 3.  Followed by radiation 04/20/2019-06/02/2019 4.  Followed by antiestrogen therapy. 06/02/2019 5. 07/15/22: Lung nodules on CT scan. 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 PET CT 07/31/23: 3 small Rt pleural based nodules hypermetabolic, hypermet Rt hilar LN ----------------------------------------------------------------------------------- ------------------------------------- Assessment and Plan    Metastatic Breast Cancer Newly diagnosed metastatic breast cancer with lung nodules. The cancer is estrogen receptor positive, progesterone receptor negative, and HER2 negative. The patient has been on Anastrozole, but the cancer has shown progression. Discussed the prognosis and treatment options. -Discontinue Anastrozole. -Start Letrozole daily. -Start Verzenio 100mg  twice daily, with potential for dose adjustment based on tolerance and side effects. -Monitor  blood counts, liver function tests initially every two weeks, then monthly, and then every three months. -First follow-up scan in three months (February 2025).  Vitamin D Deficiency Patient has a known Vitamin D deficiency which has delayed a planned foot surgery. -Check Vitamin D levels along with other routine blood work.  Planned Foot Surgery Patient has a planned foot surgery on September 18, 2023. -Stop Verzenio one week before and one week after the surgery. -Continue Letrozole during this period. -Communicate with the surgeon regarding the plan.  General Health Maintenance -Encouraged to get the flu shot. -Follow-up in 3 weeks with Jonny Ruiz, and after that I will see the patient 2 weeks later.  We may titrate the dosage of Verzinio if she tolerates it well.         Orders Placed This Encounter  Procedures   CBC with Differential (Cancer  Center Only)    Standing Status:   Future    Standing Expiration Date:   08/13/2024   CMP (Cancer Center only)    Standing Status:   Future    Standing Expiration Date:   08/13/2024   Vitamin D 25 hydroxy    Standing Status:   Future    Standing Expiration Date:   08/13/2024   The patient has a good understanding of the overall plan. she agrees with it. she will call with any problems that may develop before the next visit here. Total time spent: 30 mins including face to face time and time spent for planning, charting and co-ordination of care   Tamsen Meek, MD 08/14/23

## 2023-08-14 NOTE — Telephone Encounter (Signed)
Oral Oncology Patient Advocate Encounter   Was successful in obtaining a copay card for Verzenio.  This copay card will make the patients copay $0.  The billing information is as follows and has been shared with [facility].   RxBin: [id number] PCN: [id number] Member ID: [id number] Group ID: [id number]   Jinger Neighbors, CPhT-Adv Oncology Pharmacy Patient Advocate [facility] Direct Number: [phone]  Fax: [phone]

## 2023-08-14 NOTE — Patient Instructions (Addendum)
I am glad you have done well after the biopsies. As we discussed , your biopsy of the right lower lobe was positive for metastatic breast cancer. You have an appointment with Dr. Pamelia Hoit today to discuss options for  treatment.  He will take great care of you.  Make sure you document questions to ask Dr. Pamelia Hoit.  Daisey Must with your treatment. Please contact office for sooner follow up if symptoms do not improve or worsen or seek emergency care

## 2023-08-14 NOTE — Progress Notes (Signed)
History of Present Illness [name] is a 62 y.o. female never smoker with history of growing pulmonary nodules referred to see Dr. Tonia Brooms 07/2023.  Synopsis 62 year old female, past medical history of obesity, gastroesophageal reflux, left breast cancer diagnosed in 2020. Patient had follow-up CT imaging that shows pulmonary nodules. Subsequent imaging this year showed increase in the size of the nodule she underwent nuclear medicine pet imaging that was concerning for metastatic disease and possible reactive adenopathy within the hilum. Also small hypermetabolic nodules within the chest wall. Patient was referred to Dr. Tonia Brooms  for consideration of bronchoscopy and biopsy of the enlarging pulmonary nodule in the right lower lobe for tissue diagnosis. She was seen by Dr. Tonia Brooms 10/24 . Plan was for Flexible video fiberoptic bronchoscopy with robotic assistance and biopsies 08/10/2023. She is here today to follow up on cytology.  08/14/2023 Pt. Presents for follow up after Flexible video fiberoptic bronchoscopy with robotic assistance and biopsies . She states she has done well. No bleeding, dyspnea , fever, discolored secretions . She was very groggy the day after her procedure, had a headache 2 days after and sore throat. These have since resolved.   We discussed cytology results. The right lower lobe biopsy was positive for metastatic carcinoma consistent with breast primary.She has an appointment with Dr. Pamelia Hoit today to discuss treatment options. She had immunotherapy and chemo for her previous breast cancer in 2020.She continues to take immune therapy daily.    Test Results: Cytology 08/10/2023 FINAL MICROSCOPIC DIAGNOSIS:  A. LUNG, RLL, BIOPSY  FORCEPS:  - Metastatic carcinoma consistent with breast primary   Immunohistochemical stains were performed to characterize the carcinoma.  The tumor cells are strongly and diffusely positive for GATA3 and are  negative for p40 and  TTF-1. The findings are supportive for metastatic  carcinoma with breast primary. Breast prognostic markers (ER, PR, Her2)  will be performed and will be reported in an addendum.   PET scan 07/29/2023 There are 3 small right pleural based nodules which are hypermetabolic, remaining suspicious for possible metastatic disease. Given the small size of these nodules, they are suboptimally evaluated by PET-CT. Recommend follow-up chest CT in 3-6 months. 2. Mildly hypermetabolic right hilar lymph nodes, nonspecific. These could reflect nodal metastases or reactive nodes. No other hypermetabolic adenopathy. 3. No evidence of metastatic disease in the neck, abdomen or pelvis. 4.  Aortic Atherosclerosis (ICD10-I70.0).     Latest Ref Rng & Units 08/10/2023    9:03 AM 01/30/2021    8:18 AM 09/27/2020    8:16 AM  CBC  WBC 4.0 - 10.5 K/uL 4.0  3.9  4.2   Hemoglobin 12.0 - 15.0 g/dL 16.1  09.6  04.5   Hematocrit 36.0 - 46.0 % 36.7  37.7  38.3   Platelets 150 - 400 K/uL 157  158  160        Latest Ref Rng & Units 08/10/2023    9:03 AM 07/16/2023   12:37 PM 09/06/2021    7:54 AM  BMP  Glucose 70 - 99 mg/dL 94     BUN 8 - 23 mg/dL 16     Creatinine 4.09 - 1.00 mg/dL 8.11   9.14   Sodium 782 - 145 mmol/L 137     Potassium 3.5 - 5.1 mmol/L 3.5     Chloride 98 - 111 mmol/L 105     CO2 22 - 32 mmol/L 25     Calcium 8.9 - 10.3 mg/dL 8.9  9.5  BNP No results found for: "BNP"  ProBNP No results found for: "PROBNP"  PFT No results found for: "FEV1PRE", "FEV1POST", "FVCPRE", "FVCPOST", "TLC", "DLCOUNC", "PREFEV1FVCRT", "PSTFEV1FVCRT"  DG Chest Port 1 View  Result Date: 08/10/2023 CLINICAL DATA:  Post bronchoscopy evaluation of the lungs. Stage IV breast cancer. EXAM: PORTABLE CHEST 1 VIEW COMPARISON:  Chest radiographs 12/27/2018, 11/10/2018; CT chest 07/16/2023 FINDINGS: Interval removal of right chest wall porta catheter.Cardiac silhouette is again mildly enlarged. Mediastinal  contours are within limits. Mildly to mildly decreased lung volumes. Linear interstitial thickening within the left mid lung is similar to 07/16/2023 CT, again presumably secondary to radiation therapy changes. The multiple bilateral pulmonary nodules measuring up to 1.0 cm seen on recent CT are better seen on CT. No definite pleural effusion. No pneumothorax. Mild-to-moderate multilevel degenerative disc and endplate changes of the thoracic spine. IMPRESSION: 1. No pneumothorax. 2. Linear interstitial thickening within the left mid lung is similar to 07/16/2023 CT, again presumably secondary to radiation therapy changes. 3. The multiple known bilateral pulmonary nodules measuring up to 1.0 cm seen on recent CT are better seen on CT. Electronically Signed   By: Neita Garnet M.D.   On: 08/10/2023 11:40   DG C-ARM BRONCHOSCOPY  Result Date: 08/10/2023 C-ARM BRONCHOSCOPY: Fluoroscopy was utilized by the requesting physician.  No radiographic interpretation.   NM PET Image Initial (PI) Skull Base To Thigh  Result Date: 07/31/2023 CLINICAL DATA:  Subsequent treatment strategy for breast cancer. Enlarging right-sided pulmonary nodules on CT. EXAM: NUCLEAR MEDICINE PET SKULL BASE TO THIGH TECHNIQUE: 12.56 mCi F-18 FDG was injected intravenously. Full-ring PET imaging was performed from the skull base to thigh after the radiotracer. CT data was obtained and used for attenuation correction and anatomic localization. Fasting blood glucose: 87 mg/dl COMPARISON:  CT of the chest, abdomen and pelvis 07/16/2023 and 07/15/2022. FINDINGS: Mediastinal blood pool activity: SUV max 3.1 NECK: No hypermetabolic cervical lymph nodes are identified.Fairly symmetric activity within the lymphoid tissue of Waldeyer's ring is within physiologic limits. No suspicious activity identified within the pharyngeal mucosal space. Linear activity inferior to the left zygomatic arch appears muscular. Incidental CT findings: none CHEST: There  are 2 mildly hypermetabolic right hilar lymph nodes (SUV max 5.9). No hypermetabolic mediastinal, axillary or internal mammary lymph nodes are identified. There are 2 hypermetabolic pleural based nodules anteriorly at the right lung base corresponding with the large in nodule on CT. The largest measures 8 mm on image 59/6 and has an SUV max of 4.9. There is a small focus of pleural based metabolic activity laterally corresponding with focal pleural thickening on image 57/6. No hypermetabolic activity is seen associated with the additional nodules. Specifically, the largest right lower lobe nodule along the major fissure which measures 10 mm on image 56/6 demonstrates no hypermetabolic activity. Incidental CT findings: Additional pulmonary nodules are present bilaterally as described on recent chest CT, unchanged from older prior CTs. There are chronic treatment changes in the left upper lobe without associated hypermetabolic activity. Mild coronary artery atherosclerosis. Moderate size hiatal hernia. ABDOMEN/PELVIS: There is no hypermetabolic activity within the liver, adrenal glands, spleen or pancreas. There is no hypermetabolic nodal activity in the abdomen or pelvis. Incidental CT findings: Aortoiliac atherosclerosis. SKELETON: There is no hypermetabolic activity to suggest osseous metastatic disease. There is facet mediated metabolic activity within the lower cervical spine. No hypermetabolic breast activity identified. Incidental CT findings: Mild spondylosis. IMPRESSION: 1. There are 3 small right pleural based nodules which are hypermetabolic, remaining suspicious  for possible metastatic disease. Given the small size of these nodules, they are suboptimally evaluated by PET-CT. Recommend follow-up chest CT in 3-6 months. 2. Mildly hypermetabolic right hilar lymph nodes, nonspecific. These could reflect nodal metastases or reactive nodes. No other hypermetabolic adenopathy. 3. No evidence of metastatic disease  in the neck, abdomen or pelvis. 4.  Aortic Atherosclerosis (ICD10-I70.0). Electronically Signed   By: Carey Bullocks M.D.   On: 07/31/2023 09:26   CT CHEST ABDOMEN PELVIS W CONTRAST  Result Date: 07/23/2023 CLINICAL DATA:  Stage IV left breast cancer. Restaging. * Tracking Code: BO * EXAM: CT CHEST, ABDOMEN, AND PELVIS WITH CONTRAST TECHNIQUE: Multidetector CT imaging of the chest, abdomen and pelvis was performed following the standard protocol during bolus administration of intravenous contrast. RADIATION DOSE REDUCTION: This exam was performed according to the departmental dose-optimization program which includes automated exposure control, adjustment of the mA and/or kV according to patient size and/or use of iterative reconstruction technique. CONTRAST:  ISOVUE-300 IOPAMIDOL (ISOVUE-300) INJECTION 61% COMPARISON:  07/15/2022 CT chest, abdomen and pelvis. FINDINGS: CT CHEST FINDINGS Cardiovascular: Normal heart size. No significant pericardial effusion/thickening. Left anterior descending coronary atherosclerosis. Minimally atherosclerotic nonaneurysmal thoracic aorta. Normal caliber pulmonary arteries. No central pulmonary emboli. Mediastinum/Nodes: No significant thyroid nodules. Unremarkable esophagus. Left axillary surgical clips again noted. No pathologically enlarged axillary nodes. No mediastinal or hilar adenopathy. Lungs/Pleura: No pneumothorax. No pleural effusion. Mild sharply marginated bandlike patchy reticulation and ground-glass opacity in the anterolateral left upper lobe with associated mild volume loss and distortion, compatible with stable post radiation change. No acute consolidative airspace disease or lung masses. Interval growth of at least 3 solid scattered right pulmonary nodules, for example 0.8 cm in the peripheral right lower lobe on series 4/image 81, increased from 0.6 cm, measuring 1.0 cm in the anterior right lower lobe along the major fissure on series 4/image 68,  increased from 0.4 cm, and measuring 0.7 cm in the anterior right middle lobe (series 4/image 76), increased from 0.2 cm. Numerous additional scattered bilateral smaller pulmonary nodules are stable, for example 0.5 cm in the medial left upper lobe on series 4/image 44. Musculoskeletal: No aggressive appearing focal osseous lesions. Mild thoracic spondylosis. Grossly stable post lumpectomy change in the outer left breast. CT ABDOMEN PELVIS FINDINGS Hepatobiliary: Normal liver size. Simple 1.6 cm inferior left liver cyst. A few subcentimeter scattered hypodense inferior right liver lesions are too small to characterize and are unchanged. No new liver lesions. Normal gallbladder with no radiopaque cholelithiasis. No biliary ductal dilatation. Pancreas: Normal, with no mass or duct dilation. Spleen: Normal size. No mass. Adrenals/Urinary Tract: Normal adrenals. Normal kidneys with no hydronephrosis and no renal mass. Normal bladder. Stomach/Bowel: Chronic small to moderate hiatal hernia. Stomach is nondistended and otherwise normal. Normal caliber small bowel with no small bowel wall thickening. Normal appendix. Normal large bowel with no diverticulosis, large bowel wall thickening or pericolonic fat stranding. Vascular/Lymphatic: Atherosclerotic nonaneurysmal abdominal aorta. Patent portal, splenic, hepatic and renal veins. No pathologically enlarged lymph nodes in the abdomen or pelvis. Reproductive: Status post hysterectomy, with no abnormal findings at the vaginal cuff. No adnexal mass. Other: No pneumoperitoneum, ascites or focal fluid collection. Musculoskeletal: No aggressive appearing focal osseous lesions. Moderate lumbar spondylosis. IMPRESSION: 1. Interval growth of at least 3 solid scattered right pulmonary nodules, suspicious for enlarging pulmonary metastases. 2. No additional potential sites of metastatic disease in the chest, abdomen or pelvis. 3.  Aortic Atherosclerosis (ICD10-I70.0). Electronically  Signed   By: Barbara Cower  A Poff M.D.   On: 07/23/2023 08:41     Past medical hx Past Medical History:  Diagnosis Date   Complication of anesthesia    PONV   Fatigue    GERD (gastroesophageal reflux disease)    w nonobstructing esophageal stricture   Headache    Heat intolerance    History of ETT 03/2009   myoview EF 65%, normal wall motion, no ischemia or infarction, poor exercise capacity   HTN (hypertension)    left breast ca dx'd 10/2018   breast- left   Motion sickness    Normal echocardiogram 03/2009   EF 60-65% moderate diastolic dysfunction, mild LAE   Numbness and tingling    Obesity    PONV (postoperative nausea and vomiting)    S/P partial hysterectomy      Social History   Tobacco Use   Smoking status: Never    Passive exposure: Never   Smokeless tobacco: Never  Vaping Use   Vaping status: Never Used  Substance Use Topics   Alcohol use: No   Drug use: No    Ms.[name] reports that she has never smoked. She has never been exposed to tobacco smoke. She has never used smokeless tobacco. She reports that she does not drink alcohol and does not use drugs.  Tobacco Cessation: Never smoker    Past surgical hx, Family hx, Social hx all reviewed.  Current Outpatient Medications on File Prior to Visit  Medication Sig   anastrozole (ARIMIDEX) 1 MG tablet Take 1 tablet (1 mg total) by mouth daily.   atorvastatin (LIPITOR) 10 MG tablet Take 1 tablet by mouth daily.   carvedilol (COREG) 6.25 MG tablet TAKE 1 TABLET BY MOUTH TWICE DAILY (Patient taking differently: Take 6.25 mg by mouth 2 (two) times daily with a meal.)   lisinopril-hydrochlorothiazide (ZESTORETIC) 10-12.5 MG tablet Take 1 tablet by mouth daily.   pantoprazole (PROTONIX) 40 MG tablet Take 1 tablet (40 mg total) by mouth daily.   Vitamin D, Ergocalciferol, (DRISDOL) 1.25 MG (50000 UNIT) CAPS capsule Take 1 capsule (50,000 Units total) by mouth every 7 (seven) days.   Current Facility-Administered  Medications on File Prior to Visit  Medication   gadopentetate dimeglumine (MAGNEVIST) injection 20 mL   gadopentetate dimeglumine (MAGNEVIST) injection 20 mL     No Known Allergies  Review Of Systems:  Constitutional:   No  weight loss, night sweats,  Fevers, chills, fatigue, or  lassitude.  HEENT:   No headaches,  Difficulty swallowing,  Tooth/dental problems, or  Sore throat,                No sneezing, itching, ear ache, nasal congestion, post nasal drip,   CV:  No chest pain,  Orthopnea, PND, swelling in lower extremities, anasarca, dizziness, palpitations, syncope.   GI  No heartburn, indigestion, abdominal pain, nausea, vomiting, diarrhea, change in bowel habits, loss of appetite, bloody stools.   Resp: No shortness of breath with exertion or at rest.  No excess mucus, no productive cough,  No non-productive cough,  No coughing up of blood.  No change in color of mucus.  No wheezing.  No chest wall deformity  Skin: no rash or lesions.  GU: no dysuria, change in color of urine, no urgency or frequency.  No flank pain, no hematuria   MS:  No joint pain or swelling.  No decreased range of motion.  No back pain.  Psych:  No change in mood or affect. No depression or anxiety.  No memory loss.   Vital Signs BP 126/72 (BP Location: Right Arm, Patient Position: Sitting, Cuff Size: Normal)   Pulse 83   Temp 98.2 F (36.8 C) (Oral)   Ht 6\' 2"  (1.88 m)   SpO2 98%   BMI 35.44 kg/m    Physical Exam:  General- No distress,  A&Ox3, pleasant ENT: No sinus tenderness, TM clear, pale nasal mucosa, no oral exudate,no post nasal drip, no LAN Cardiac: S1, S2, regular rate and rhythm, no murmur Chest: No wheeze/ rales/ dullness; no accessory muscle use, no nasal flaring, no sternal retractions, slightly diminished per bases Abd.: Soft Non-tender, ND, BS +, Body mass index is 35.44 kg/m.  Ext: No clubbing cyanosis, edema Neuro:  normal strength, MAE x 4, A&O x 3, appropriate Skin:  No rashes, warm and dry, no lesions  Psych: normal mood and behavior   Assessment/Plan  Metastatic Breast Cancer to the right lung  PET positive right hilar lymph nodes Plan  I am glad you have done well after the biopsies. As we discussed , your biopsy of the right lower lobe was positive for metastatic breast cancer. You have an appointment with Dr. Pamelia Hoit today to discuss options for  treatment.   I spent 30 minutes dedicated to the care of this patient on the date of this encounter to include pre-visit review of records, face-to-face time with the patient discussing conditions above, post visit ordering of testing, clinical documentation with the electronic health record, making appropriate referrals as documented, and communicating necessary information to the patient's healthcare team.     Bevelyn Ngo, NP 08/14/2023  9:55 AM

## 2023-08-14 NOTE — Telephone Encounter (Signed)
Oral Oncology Pharmacist Encounter  Received new prescription for Verzenio (Abemaciclib) for the treatment of metastatic ER+, HER2- breast cancer in conjunction with letrozole 2.5 mg daily, planned duration until disease progression or unacceptable toxicity.  Labs from 08/10/23 assessed, all labs from BMP WNL with CrCl of 122.94. CBC from 08/10/23 showed Hgb of 11.5 with all other labs WNL. Patient will need CMP before starting medication to assess LFTs.  Current medication list in Epic reviewed, No significant/relevant DDIs with Verzenio identified.  Evaluated chart and no patient barriers to medication adherence noted.   Patient agreement for treatment documented in MD note on 08/14/23.  Prescription has been e-scribed to the [facility] for benefits analysis and approval.  Oral Oncology Clinic will continue to follow for insurance authorization, copayment issues, initial counseling and start date.  Francetta Found, PharmD Candidate Class of 2025  08/14/2023 1:49 PM Oral Oncology Clinic [phone]

## 2023-08-14 NOTE — Telephone Encounter (Addendum)
Oral Oncology Patient Advocate Encounter   Received notification that prior authorization for Verzenio is required by [facility]   PA submitted on 08/14/23 Key [id number] Status is pending    Received notification that prior authorization for Verzenio is required by [employer]	[place]   PA submitted on 08/14/23 Key [id number] Status is pending   Jinger Neighbors, CPhT-Adv Oncology Pharmacy Patient Advocate [facility] Direct Number: [phone]  Fax: [phone]

## 2023-08-17 ENCOUNTER — Inpatient Hospital Stay: Payer: BC Managed Care – PPO

## 2023-08-17 DIAGNOSIS — C50412 Malignant neoplasm of upper-outer quadrant of left female breast: Secondary | ICD-10-CM

## 2023-08-17 LAB — CBC WITH DIFFERENTIAL (CANCER CENTER ONLY)
Abs Immature Granulocytes: 0.01 10*3/uL (ref 0.00–0.07)
Basophils Absolute: 0 10*3/uL (ref 0.0–0.1)
Basophils Relative: 0 %
Eosinophils Absolute: 0.1 10*3/uL (ref 0.0–0.5)
Eosinophils Relative: 2 %
HCT: 35.9 % — ABNORMAL LOW (ref 36.0–46.0)
Hemoglobin: 11.8 g/dL — ABNORMAL LOW (ref 12.0–15.0)
Immature Granulocytes: 0 %
Lymphocytes Relative: 27 %
Lymphs Abs: 1.4 10*3/uL (ref 0.7–4.0)
MCH: 28.6 pg (ref 26.0–34.0)
MCHC: 32.9 g/dL (ref 30.0–36.0)
MCV: 86.9 fL (ref 80.0–100.0)
Monocytes Absolute: 0.4 10*3/uL (ref 0.1–1.0)
Monocytes Relative: 8 %
Neutro Abs: 3.2 10*3/uL (ref 1.7–7.7)
Neutrophils Relative %: 63 %
Platelet Count: 170 10*3/uL (ref 150–400)
RBC: 4.13 MIL/uL (ref 3.87–5.11)
RDW: 12.3 % (ref 11.5–15.5)
WBC Count: 5.1 10*3/uL (ref 4.0–10.5)
nRBC: 0 % (ref 0.0–0.2)

## 2023-08-17 LAB — CMP (CANCER CENTER ONLY)
ALT: 12 U/L (ref 0–44)
AST: 13 U/L — ABNORMAL LOW (ref 15–41)
Albumin: 4 g/dL (ref 3.5–5.0)
Alkaline Phosphatase: 73 U/L (ref 38–126)
Anion gap: 5 (ref 5–15)
BUN: 20 mg/dL (ref 8–23)
CO2: 31 mmol/L (ref 22–32)
Calcium: 9.7 mg/dL (ref 8.9–10.3)
Chloride: 105 mmol/L (ref 98–111)
Creatinine: 1.14 mg/dL — ABNORMAL HIGH (ref 0.44–1.00)
GFR, Estimated: 54 mL/min — ABNORMAL LOW (ref >60)
Glucose, Bld: 90 mg/dL (ref 70–99)
Potassium: 3.9 mmol/L (ref 3.5–5.1)
Sodium: 141 mmol/L (ref 135–145)
Total Bilirubin: 0.4 mg/dL (ref <1.2)
Total Protein: 7.4 g/dL (ref 6.5–8.1)

## 2023-08-17 LAB — VITAMIN D 25 HYDROXY (VIT D DEFICIENCY, FRACTURES): Vit D, 25-Hydroxy: 48.12 ng/mL (ref 30–100)

## 2023-08-17 NOTE — Progress Notes (Signed)
FYI +path for met breast  Thanks,  BLI  Josephine Igo, DO [facility] 08/17/2023 9:49 AM

## 2023-08-18 ENCOUNTER — Other Ambulatory Visit: Payer: Self-pay | Admitting: Podiatry

## 2023-08-18 ENCOUNTER — Ambulatory Visit: Payer: BC Managed Care – PPO | Admitting: Hematology and Oncology

## 2023-08-18 DIAGNOSIS — E559 Vitamin D deficiency, unspecified: Secondary | ICD-10-CM

## 2023-08-20 ENCOUNTER — Telehealth: Payer: Self-pay | Admitting: *Deleted

## 2023-08-20 NOTE — Telephone Encounter (Signed)
[employer] [phone] form completed to provider to review and sign.  Today returned to [employer].  Patient notified and will pick up form.  Copy to [facility] H.I.M. bin for items to be scanned.

## 2023-08-21 ENCOUNTER — Encounter: Payer: Self-pay | Admitting: Hematology and Oncology

## 2023-08-24 NOTE — Telephone Encounter (Incomplete)
Oral Chemotherapy Pharmacist Encounter  I spoke with patient for overview of: Verzenio for the treatment of metastatic, hormone-receptor positive breast cancer, in combination with letrozole, planned duration until disease progression or unacceptable toxicity.   Counseled patient on administration, dosing, side effects, monitoring, drug-food interactions, safe handling, storage, and disposal.  Patient will take Verzenio 100mg  tablets, 1 tablet by mouth twice daily without regard to food. Patient states that she will take with food because she tolerates medication better.   Patient knows to avoid grapefruit and grapefruit juice.  Verzenio start date: 08/24/2023 (at night)  Adverse effects include but are not limited to: diarrhea, fatigue, nausea, abdominal pain, decreased blood counts, and increased liver function tests, and joint pains. Severe, life-threatening, and/or fatal interstitial lung disease (ILD) and/or pneumonitis may occur with CDK 4/6 inhibitors.  Patient has anti-emetic on hand and knows to take it if nausea develops.   Patient will obtain anti diarrheal and alert the office of 4 or more loose stools above baseline.  Reviewed with patient importance of keeping a medication schedule and plan for any missed doses. No barriers to medication adherence identified. Patient had new CMP with no changes needed.   Medication reconciliation performed and medication/allergy list updated.  Insurance authorization for [employer] has been obtained. [facility] with a copay of $0 for 28 days.  Patient informed the pharmacy will reach out 5-7 days prior to needing next fill of Verzenio to coordinate continued medication acquisition to prevent break in therapy.  All questions answered. Patient voiced understanding and appreciation.   Medication education handout placed in mail for patient. Patient knows to call the office with questions or  concerns. Oral Chemotherapy Clinic phone number provided to patient.  Patient has follow-up in two weeks with Jonny Ruiz and patient knows she will get labs at that time.   Bethel Born, PharmD Hematology/Oncology Clinical Pharmacist [facility] [phone] 08/24/2023   4:09 PM

## 2023-08-26 ENCOUNTER — Telehealth: Payer: Self-pay | Admitting: Podiatry

## 2023-08-26 ENCOUNTER — Other Ambulatory Visit: Payer: Self-pay | Admitting: *Deleted

## 2023-08-26 MED ORDER — ONDANSETRON HCL 4 MG PO TABS: 4.0000 mg | ORAL_TABLET | Freq: Three times a day (TID) | ORAL | 0 refills | Status: DC | PRN

## 2023-08-26 MED ORDER — VITAMIN D (ERGOCALCIFEROL) 1.25 MG (50000 UNIT) PO CAPS: 50000.0000 [IU] | ORAL_CAPSULE | ORAL | 0 refills | Status: DC

## 2023-08-26 NOTE — Telephone Encounter (Signed)
Patient called([name]) and stated she's been trying for the past few days to get a hold of the provider/nurse in regards to a few questions that need to be answered prior to her upcoming appointment (Dec 6th [facility]). As in what are some protocols she needs to take beforehand or what is she to be prepared to do after? Is her plan of care changing after her appointment or will she remain on the same follow up sched she has now? She has also stated she is out of vitamin D pills and would like for some to be sent in prior to this appointment([facility] [address] in [place]).

## 2023-08-26 NOTE — Telephone Encounter (Signed)
I spoke with the patient by phone today since her last discussion with me after we had to delay her surgery due to her vitamin D deficiency she has gone to her oncologist for her annual follow-up for her breast cancer in remission and unfortunately a mass was found increasing in size on her PET scan and she underwent a lung biopsy which showed stage IV breast cancer metastatic to the lungs.  She is currently undergoing treatment which she started this week with letrozole and Verzenio.  We discussed the risks of proceeding with surgery while she is actively under treatment currently her oncologist does not oppose proceeding with surgery.  I will discuss with her oncologist the impacts of these treatments on her soft tissue and bony healing following surgery.  We also discussed an increased risk of DVT and PE with active malignancy.  I had planned to put her on prophylaxis regardless and we discussed that prophylaxis can lower the chance of a blood clot but cannot prevent 100%.  We also discussed that proceeding with surgery may completely depend on how she is physically feeling during her treatments and how she is responding to it if she is having any adverse effects.  She is due for new lab work in 2 weeks and we will recheck her vitamin D and platelet levels around that timeframe and I will discuss with her further once I have spoken with her oncologist.  Sharl Ma, DPM 08/26/2023

## 2023-08-26 NOTE — Progress Notes (Signed)
Received call from pt requesting PRN Zofran to take incase she experiences nausea while on Verzenio.  Verbal orders received from MD for pt to receive Zofran 4 mg p.o Q 8 hrs PRN.  Prescription sent to pharmacy on file.

## 2023-08-27 ENCOUNTER — Encounter: Payer: BC Managed Care – PPO | Admitting: Podiatry

## 2023-08-28 ENCOUNTER — Other Ambulatory Visit: Payer: Self-pay | Admitting: *Deleted

## 2023-08-28 ENCOUNTER — Encounter: Payer: Self-pay | Admitting: *Deleted

## 2023-08-28 DIAGNOSIS — C50412 Malignant neoplasm of upper-outer quadrant of left female breast: Secondary | ICD-10-CM

## 2023-08-28 NOTE — Progress Notes (Signed)
Pt requesting detailed letter stating she was seen annually between 2020-2024 and received CT CAP images anually.  Letter typed and left at front desk for pt to pick up.  Pt also requesting order for mammogram be sent to [facility], RN successfully faxed order.

## 2023-09-01 ENCOUNTER — Other Ambulatory Visit: Payer: Self-pay | Admitting: Hematology and Oncology

## 2023-09-01 DIAGNOSIS — K219 Gastro-esophageal reflux disease without esophagitis: Secondary | ICD-10-CM

## 2023-09-02 ENCOUNTER — Encounter: Payer: Self-pay | Admitting: Hematology and Oncology

## 2023-09-02 ENCOUNTER — Telehealth: Payer: Self-pay | Admitting: Podiatry

## 2023-09-02 NOTE — Telephone Encounter (Signed)
Called patient to disc disability paperwork -- LMVM .....    [name] returned my call -- advised she is out on leave due to another medical condition and no longer needs the FMLA documents completed and sent in.  She is under another doctors care and will be discussing her surgery (sched for 12/06) with Dr. Ardelle Anton  ....    J. Abbott -- 09/02/2023

## 2023-09-03 ENCOUNTER — Telehealth: Payer: Self-pay | Admitting: Podiatry

## 2023-09-03 NOTE — Progress Notes (Signed)
Clinical Information Request completed and faxed to [facility] as requested. Copy of form emailed to Patient

## 2023-09-03 NOTE — Telephone Encounter (Signed)
See encounter notes from yesterday (09/02/2023) regarding conversation with the patient.  Sent note to Dr. Lilian Kapur to make him aware  ....     J. Abbott -- 09/03/2023

## 2023-09-07 ENCOUNTER — Other Ambulatory Visit: Payer: Self-pay

## 2023-09-07 ENCOUNTER — Telehealth: Payer: Self-pay

## 2023-09-07 ENCOUNTER — Other Ambulatory Visit: Payer: Self-pay | Admitting: Hematology and Oncology

## 2023-09-07 DIAGNOSIS — Z170 Estrogen receptor positive status [ER+]: Secondary | ICD-10-CM

## 2023-09-07 NOTE — Telephone Encounter (Signed)
Called Mrs.Smith-Lambert back and left her a message that her appt has been rescheduled and to bring her disability paperwork in with her with her portion completed and hand it to [employer]. Lorayne Marek, RN

## 2023-09-08 ENCOUNTER — Inpatient Hospital Stay: Payer: BC Managed Care – PPO

## 2023-09-08 ENCOUNTER — Telehealth: Payer: Self-pay

## 2023-09-08 ENCOUNTER — Other Ambulatory Visit: Payer: Self-pay | Admitting: *Deleted

## 2023-09-08 ENCOUNTER — Inpatient Hospital Stay: Payer: BC Managed Care – PPO | Admitting: Pharmacist

## 2023-09-08 ENCOUNTER — Telehealth: Payer: Self-pay | Admitting: Pharmacist

## 2023-09-08 VITALS — BP 110/71 | HR 76 | Temp 98.3°F | Resp 18 | Ht 74.0 in | Wt 279.8 lb

## 2023-09-08 DIAGNOSIS — C50412 Malignant neoplasm of upper-outer quadrant of left female breast: Secondary | ICD-10-CM

## 2023-09-08 DIAGNOSIS — Z170 Estrogen receptor positive status [ER+]: Secondary | ICD-10-CM

## 2023-09-08 LAB — CMP (CANCER CENTER ONLY)
ALT: 11 U/L (ref 0–44)
AST: 13 U/L — ABNORMAL LOW (ref 15–41)
Albumin: 3.9 g/dL (ref 3.5–5.0)
Alkaline Phosphatase: 71 U/L (ref 38–126)
Anion gap: 5 (ref 5–15)
BUN: 17 mg/dL (ref 8–23)
CO2: 28 mmol/L (ref 22–32)
Calcium: 9.6 mg/dL (ref 8.9–10.3)
Chloride: 107 mmol/L (ref 98–111)
Creatinine: 1.37 mg/dL — ABNORMAL HIGH (ref 0.44–1.00)
GFR, Estimated: 44 mL/min — ABNORMAL LOW (ref >60)
Glucose, Bld: 100 mg/dL — ABNORMAL HIGH (ref 70–99)
Potassium: 3.6 mmol/L (ref 3.5–5.1)
Sodium: 140 mmol/L (ref 135–145)
Total Bilirubin: 0.4 mg/dL (ref <1.2)
Total Protein: 7.4 g/dL (ref 6.5–8.1)

## 2023-09-08 LAB — CBC WITH DIFFERENTIAL (CANCER CENTER ONLY)
Abs Immature Granulocytes: 0.01 10*3/uL (ref 0.00–0.07)
Basophils Absolute: 0 10*3/uL (ref 0.0–0.1)
Basophils Relative: 1 %
Eosinophils Absolute: 0.1 10*3/uL (ref 0.0–0.5)
Eosinophils Relative: 2 %
HCT: 37.1 % (ref 36.0–46.0)
Hemoglobin: 11.7 g/dL — ABNORMAL LOW (ref 12.0–15.0)
Immature Granulocytes: 0 %
Lymphocytes Relative: 36 %
Lymphs Abs: 1.2 10*3/uL (ref 0.7–4.0)
MCH: 27.8 pg (ref 26.0–34.0)
MCHC: 31.5 g/dL (ref 30.0–36.0)
MCV: 88.1 fL (ref 80.0–100.0)
Monocytes Absolute: 0.3 10*3/uL (ref 0.1–1.0)
Monocytes Relative: 7 %
Neutro Abs: 1.8 10*3/uL (ref 1.7–7.7)
Neutrophils Relative %: 54 %
Platelet Count: 166 10*3/uL (ref 150–400)
RBC: 4.21 MIL/uL (ref 3.87–5.11)
RDW: 12.3 % (ref 11.5–15.5)
WBC Count: 3.4 10*3/uL — ABNORMAL LOW (ref 4.0–10.5)
nRBC: 0 % (ref 0.0–0.2)

## 2023-09-08 LAB — VITAMIN D 25 HYDROXY (VIT D DEFICIENCY, FRACTURES): Vit D, 25-Hydroxy: 64.47 ng/mL (ref 30–100)

## 2023-09-08 NOTE — Telephone Encounter (Signed)
Left patient a message in regards to rescheduling cancelled appointment in Pharmacy [facility], left callback for patient to rescheduled appointment for 09/08/2023

## 2023-09-08 NOTE — Progress Notes (Signed)
[facility]       Telephone: [phone]?Fax: [phone]   Oncology Clinical Pharmacist Practitioner Initial Assessment  [name] is a 62 y.o. female with a diagnosis of breast cancer. They were contacted today via in-person visit.  Indication/Regimen Abemaciclib (Verzenio) is being used appropriately for treatment of metastatic breast cancer by Dr. Serena Croissant.      Wt Readings from Last 1 Encounters:  08/14/23 285 lb 9.6 oz (129.5 kg)    Estimated body surface area is 2.6 meters squared as calculated from the following:   Height as of 08/14/23: 6\' 2"  (1.88 m).   Weight as of 08/14/23: 285 lb 9.6 oz (129.5 kg).  The dosing regimen is 100 mg by mouth every 12 hours on days 1 to 28 of a 28-day cycle. This is being given  in combination with letrozole which war started on 08/14/23 . It is planned to continue until disease progression or unacceptable toxicity. Prescription dose and frequency assessed for appropriateness.  Patient has agreed to treatment which is documented in physician note on 08/14/23. Counseled patient on administration, dosing, side effects, monitoring, drug-food interactions, safe handling, storage, and disposal.  Overall, she is doing well so far. She is experiencing some nausea but it goes away on it's own. She is not needing ondansetron yet. No diarrhea reported. She will have foot surgery on 09/18/23 and Dr. Pamelia Hoit said to hold abemaciclib starting 7 days prior (09/11/23) and resume 7 days after if no complications (09/25/23). This was relayed to [name] today. She will have Guardant 360 done today and Vitamin D level drawn after our visit. Labs on 09/21/23 will be prior to Dr. Pamelia Hoit visit. Also having some fatigue and low grade headaches. Discussed importance of drinking plenty of fluids.   Dose Modifications Reduced dose of 100 mg every 12 hours per Dr. Pamelia Hoit  Access Assessment [name] will be receiving  abemaciclib through [facility] Insurance Concerns: none Start date if known: 08/24/23  Adherence Assessment Reviewed importance on keeping a med schedule and plan for any missed doses Barriers to adherence identified? No  Allergies No Known Allergies  Vitals    09/08/2023    2:02 PM 08/14/2023   12:32 PM 08/14/2023    9:36 AM  Oncology Vitals  Height 188 cm 188 cm 188 cm  Weight 126.916 kg 129.547 kg --  Weight (lbs) 279 lbs 13 oz 285 lbs 10 oz --  BMI 35.92 kg/m2 36.67 kg/m2 35.44 kg/m2  Temp 98.3 F (36.8 C) 97.9 F (36.6 C) 98.2 F (36.8 C)  Pulse Rate 76 84 83  BP 110/71 134/78 126/72  Resp 18 16   SpO2 100 % 100 % 98 %  BSA (m2) 2.57 m2 2.6 m2 2.56 m2     Laboratory Data    Latest Ref Rng & Units 09/08/2023    1:20 PM 08/17/2023   11:52 AM 08/10/2023    9:03 AM  CBC EXTENDED  WBC 4.0 - 10.5 K/uL 3.4  5.1  4.0   RBC 3.87 - 5.11 MIL/uL 4.21  4.13  4.15   Hemoglobin 12.0 - 15.0 g/dL 24.4  01.0  27.2   HCT 36.0 - 46.0 % 37.1  35.9  36.7   Platelets 150 - 400 K/uL 166  170  157   NEUT# 1.7 - 7.7 K/uL 1.8  3.2    Lymph# 0.7 - 4.0 K/uL 1.2  1.4  Latest Ref Rng & Units 09/08/2023    1:20 PM 08/17/2023   11:52 AM 08/10/2023    9:03 AM  CMP  Glucose 70 - 99 mg/dL 191  90  94   BUN 8 - 23 mg/dL 17  20  16    Creatinine 0.44 - 1.00 mg/dL 4.78  2.95  6.21   Sodium 135 - 145 mmol/L 140  141  137   Potassium 3.5 - 5.1 mmol/L 3.6  3.9  3.5   Chloride 98 - 111 mmol/L 107  105  105   CO2 22 - 32 mmol/L 28  31  25    Calcium 8.9 - 10.3 mg/dL 9.6  9.7  8.9   Total Protein 6.5 - 8.1 g/dL 7.4  7.4    Total Bilirubin <1.2 mg/dL 0.4  0.4    Alkaline Phos 38 - 126 U/L 71  73    AST 15 - 41 U/L 13  13    ALT 0 - 44 U/L 11  12     Contraindications Contraindications were reviewed? Yes Contraindications to therapy were identified? No   Safety Precautions The following safety precautions for the use of abemaciclib were reviewed:  Changes in  kidney function: importance of drinking plenty of fluids and monitoring urine output Diarrhea: we reviewed that diarrhea is common with abemaciclib and confirmed that she does have loperamide (Imodium) at home.  We reviewed how to take this medication PRN and gave her information on abemaciclib Decreased white blood cells (WBCs) and increased risk for infection: we discussed the importance of having a thermometer and what the Centers for Disease Control and Prevention (CDC) considers a fever which is 100.69F (38C) or higher.  Gave patient 24/7 triage line to call if any fevers or symptoms Decreased hemoglobin, part of red blood cells that carry iron and oxygen Fatigue Nausea and Vomiting Hepatotoxicity: reviewed to contact clinic for RUQ pain that will not subside, yellowing of eyes/skin Decreased appetite or weight loss Abdominal pain Decreased platelet count and increased risk for bleeding Venous thromboembolism (VTE): reviewed signs of deep vein thrombosis (DVT) such as leg swelling, redness, pain, or tenderness and signs of pulmonary embolism (PE) such as shortness of breath, rapid or irregular heartbeat, cough, chest pain, or lightheadedness ILD/Pneumonitis: we reviewed potential symptoms including cough, shortness, and fatigue. Handling body fluids and waste Pregnancy, sexual activity, and contraception Avoid grapefruit products Reviewed to take the medication every 12 hours (with food sometimes can be easier on the stomach) and to take it at the same time every day. Discussed proper storage and handling of abemaciclib  Medication Reconciliation Current Outpatient Medications  Medication Sig Dispense Refill   abemaciclib (VERZENIO) 100 MG tablet Take 1 tablet (100 mg total) by mouth 2 (two) times daily. 60 tablet 3   atorvastatin (LIPITOR) 10 MG tablet Take 1 tablet by mouth daily.     carvedilol (COREG) 6.25 MG tablet TAKE 1 TABLET BY MOUTH TWICE DAILY (Patient taking differently: Take  6.25 mg by mouth 2 (two) times daily with a meal.) 180 tablet 1   letrozole (FEMARA) 2.5 MG tablet Take 1 tablet (2.5 mg total) by mouth daily. 90 tablet 3   lisinopril-hydrochlorothiazide (ZESTORETIC) 10-12.5 MG tablet Take 1 tablet by mouth daily.     ondansetron (ZOFRAN) 4 MG tablet Take 1 tablet (4 mg total) by mouth every 8 (eight) hours as needed for nausea or vomiting. 20 tablet 0   pantoprazole (PROTONIX) 40 MG tablet TAKE 1 TABLET BY MOUTH  EVERY DAY 90 tablet 1   Vitamin D, Ergocalciferol, (DRISDOL) 1.25 MG (50000 UNIT) CAPS capsule Take 1 capsule (50,000 Units total) by mouth every 7 (seven) days. 8 capsule 0   No current facility-administered medications for this visit.   Facility-Administered Medications Ordered in Other Visits  Medication Dose Route Frequency Provider Last Rate Last Admin   gadopentetate dimeglumine (MAGNEVIST) injection 20 mL  20 mL Intravenous Once PRN Anson Fret, MD       gadopentetate dimeglumine (MAGNEVIST) injection 20 mL  20 mL Intravenous Once PRN Anson Fret, MD        Medication reconciliation is based on the patient's most recent medication list in the electronic medical record (EMR) including herbal products and OTC medications.   The patient's medication list was reviewed today with the patient? Yes   Drug-drug interactions (DDIs) DDIs were evaluated? Yes Significant DDIs identified? No   Drug-Food Interactions Drug-food interactions were evaluated? Yes Drug-food interactions identified?  Avoid grapefruit products  Follow-up Plan  Patient education handout given to patient Continue abemaciclib 100 mg by mouth every 12 hours Continue letrozole 2.5 mg by mouth daily See Dr. Pamelia Hoit with labs on 09/21/23. Labs need to be prior to appt. Sent scheduling message Hold abemaciclib starting 7 days before and 7 days after foot surgery planned for 09/18/23. Monitor serum creatinine and other abemaciclib toxicities. Follow up with clinical  pharmacy if needed after seeing Dr. Pamelia Hoit Monitor for toxicities Per Dr. Pamelia Hoit hold abemaciclib for 1 week prior and 1 week after planned foot surgery  Devery Harper [facility] participated in the discussion, expressed understanding, and voiced agreement with the above plan. All questions were answered to her satisfaction. The patient was advised to contact the clinic at [phone] with any questions or concerns prior to her return visit.   I spent 60 minutes assessing the patient.  Demere Dotzler A. Odetta Pink, PharmD, BCOP, CPP  Anselm Lis, RPH-CPP, 09/08/2023 1:50 PM  **Disclaimer: This note was dictated with voice recognition software. Similar sounding words can inadvertently be transcribed and this note may contain transcription errors which may not have been corrected upon publication of note.**

## 2023-09-08 NOTE — Progress Notes (Signed)
Per MD request, orders placed for Guardant 360.

## 2023-09-08 NOTE — Telephone Encounter (Signed)
Notified patient that her APS for [employer] had been completed and faxed back to company. Fax confirmation received. Copy placed upfront for pick up at next visit.

## 2023-09-09 ENCOUNTER — Encounter: Payer: Self-pay | Admitting: Podiatry

## 2023-09-09 ENCOUNTER — Other Ambulatory Visit: Payer: BC Managed Care – PPO

## 2023-09-15 ENCOUNTER — Telehealth: Payer: Self-pay | Admitting: Cardiology

## 2023-09-15 NOTE — Telephone Encounter (Signed)
Pt called in and stated she was diagnosis with cancer and would like to see Dr Jacinto Halim only as soon as possible due to her treatments.  She refused to see a PA.  Would like to know if she could talk to Dr Jacinto Halim about her meds though treatment     Best number [phone]

## 2023-09-16 ENCOUNTER — Other Ambulatory Visit: Payer: Self-pay | Admitting: *Deleted

## 2023-09-16 DIAGNOSIS — Z170 Estrogen receptor positive status [ER+]: Secondary | ICD-10-CM

## 2023-09-16 NOTE — Telephone Encounter (Signed)
Called her twice and left a message on the phone, I also discussed with Dr. Georgiann Mohs regarding the chemotherapy that is planned/immunotherapy, no significant cardiac involvement and this message has been left to the patient.  No indication for patient to see me unless necessary.

## 2023-09-17 ENCOUNTER — Other Ambulatory Visit: Payer: BC Managed Care – PPO | Attending: Hematology and Oncology

## 2023-09-17 ENCOUNTER — Telehealth: Payer: Self-pay | Admitting: Podiatry

## 2023-09-17 ENCOUNTER — Inpatient Hospital Stay (HOSPITAL_BASED_OUTPATIENT_CLINIC_OR_DEPARTMENT_OTHER): Payer: BC Managed Care – PPO | Admitting: Hematology and Oncology

## 2023-09-17 ENCOUNTER — Other Ambulatory Visit: Payer: Self-pay | Admitting: Podiatry

## 2023-09-17 VITALS — BP 130/80 | HR 79 | Temp 97.4°F | Resp 18 | Ht 74.0 in | Wt 283.3 lb

## 2023-09-17 DIAGNOSIS — Z9221 Personal history of antineoplastic chemotherapy: Secondary | ICD-10-CM | POA: Insufficient documentation

## 2023-09-17 DIAGNOSIS — Z923 Personal history of irradiation: Secondary | ICD-10-CM | POA: Insufficient documentation

## 2023-09-17 DIAGNOSIS — Z9012 Acquired absence of left breast and nipple: Secondary | ICD-10-CM | POA: Insufficient documentation

## 2023-09-17 DIAGNOSIS — Z170 Estrogen receptor positive status [ER+]: Secondary | ICD-10-CM | POA: Insufficient documentation

## 2023-09-17 DIAGNOSIS — Z79899 Other long term (current) drug therapy: Secondary | ICD-10-CM | POA: Insufficient documentation

## 2023-09-17 DIAGNOSIS — Z79811 Long term (current) use of aromatase inhibitors: Secondary | ICD-10-CM | POA: Insufficient documentation

## 2023-09-17 DIAGNOSIS — C50412 Malignant neoplasm of upper-outer quadrant of left female breast: Secondary | ICD-10-CM | POA: Diagnosis not present

## 2023-09-17 LAB — CMP (CANCER CENTER ONLY)
ALT: 10 U/L (ref 0–44)
AST: 13 U/L — ABNORMAL LOW (ref 15–41)
Albumin: 4 g/dL (ref 3.5–5.0)
Alkaline Phosphatase: 70 U/L (ref 38–126)
Anion gap: 5 (ref 5–15)
BUN: 12 mg/dL (ref 8–23)
CO2: 31 mmol/L (ref 22–32)
Calcium: 9.7 mg/dL (ref 8.9–10.3)
Chloride: 104 mmol/L (ref 98–111)
Creatinine: 1.08 mg/dL — ABNORMAL HIGH (ref 0.44–1.00)
GFR, Estimated: 58 mL/min — ABNORMAL LOW (ref >60)
Glucose, Bld: 127 mg/dL — ABNORMAL HIGH (ref 70–99)
Potassium: 3.6 mmol/L (ref 3.5–5.1)
Sodium: 140 mmol/L (ref 135–145)
Total Bilirubin: 0.4 mg/dL (ref <1.2)
Total Protein: 7.2 g/dL (ref 6.5–8.1)

## 2023-09-17 LAB — CBC WITH DIFFERENTIAL (CANCER CENTER ONLY)
Abs Immature Granulocytes: 0 10*3/uL (ref 0.00–0.07)
Basophils Absolute: 0 10*3/uL (ref 0.0–0.1)
Basophils Relative: 1 %
Eosinophils Absolute: 0 10*3/uL (ref 0.0–0.5)
Eosinophils Relative: 1 %
HCT: 35.4 % — ABNORMAL LOW (ref 36.0–46.0)
Hemoglobin: 11.4 g/dL — ABNORMAL LOW (ref 12.0–15.0)
Immature Granulocytes: 0 %
Lymphocytes Relative: 31 %
Lymphs Abs: 1.1 10*3/uL (ref 0.7–4.0)
MCH: 28.4 pg (ref 26.0–34.0)
MCHC: 32.2 g/dL (ref 30.0–36.0)
MCV: 88.3 fL (ref 80.0–100.0)
Monocytes Absolute: 0.3 10*3/uL (ref 0.1–1.0)
Monocytes Relative: 7 %
Neutro Abs: 2 10*3/uL (ref 1.7–7.7)
Neutrophils Relative %: 60 %
Platelet Count: 135 10*3/uL — ABNORMAL LOW (ref 150–400)
RBC: 4.01 MIL/uL (ref 3.87–5.11)
RDW: 12.7 % (ref 11.5–15.5)
WBC Count: 3.4 10*3/uL — ABNORMAL LOW (ref 4.0–10.5)
nRBC: 0 % (ref 0.0–0.2)

## 2023-09-17 MED ORDER — DICLOFENAC SODIUM 1 % EX GEL: 4.0000 g | Freq: Four times a day (QID) | CUTANEOUS | 4 refills | Status: DC

## 2023-09-17 NOTE — Assessment & Plan Note (Addendum)
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  AX L&D 03/11/2019: 3/14 lymph nodes positive with focal extranodal involvement [id number]   Treatment plan: 1.  Neoadjuvant chemotherapy with dose dense Adriamycin and Cytoxan every 2 weeks x4 followed by Taxol weekly x 3 discontinued due to severe peripheral neuropathy completed 01/20/2019 2.  Followed by mastectomy on 02/22/2019 3.  Followed by radiation 04/20/2019-06/02/2019 4.  Followed by antiestrogen therapy. 06/02/2019 5. 07/15/22: Lung nodules on CT scan. 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 PET CT 07/31/23: 3 small Rt pleural based nodules hypermetabolic, hypermet Rt hilar LN ----------------------------------------------------------------------------------- Current treatment: Letrozole with Verzenio started 08/14/2023 Guardant360 09/16/2023: No ESR 1 mutation.  MSI high not detected  Verzinio toxicities: Mild headaches Mild nausea Abdominal cramps: No diarrhea Slight leukopenia Mild thrombocytopenia  Based on the above symptoms we decided to maintain the dosage of 100 p.o. twice daily. Our plan is to obtain CT CAP end of January.  Surgical plans in the foot have changed.  She is getting her nails removed on 09/18/2023.  Extensive foot surgery has been canceled for the time being.  Therefore she will restart Verzinio on Saturday.  Plan to obtain  scans towards the end of January 2025

## 2023-09-17 NOTE — Telephone Encounter (Signed)
Discussed non operative alternatives at this time, we will delay surgery to see if these are successful as she will not in her current health be able to maintain NWB safely and effectively for outpatient surgery discharged to home even with home services. Appt will be scheduled for CMO fitting, Rx for [facility] brace will be given to her, and we will plan for matrixectomy of the bilateral great toenails and other affected toenails as an outpatient tomorrow under sedation. Rx for diclofenac gel and a prescription compounded cream will be sent as well

## 2023-09-17 NOTE — Progress Notes (Signed)
Patient Care Team: Elie Confer, NP as PCP - Earma Reading, MD as Consulting Physician (General Surgery) Serena Croissant, MD as Consulting Physician (Hematology and Oncology) Dorothy Puffer, MD as Consulting Physician (Radiation Oncology)  DIAGNOSIS:  Encounter Diagnosis  Name Primary?   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, lymph node biopsy positive, intramammary lymph node biopsy negative, ER 100%, PR 100%, Ki-67 20%, HER-2 1+ by IHC, T2 and 1 stage 2A   10/27/2018 Cancer Staging   Staging form: Breast, AJCC 8th Edition - Clinical: Stage IIA (cT2, cN1(f), cM0, G1, ER+, PR+, HER2-) - Signed by Serena Croissant, MD on 11/01/2018   11/11/2018 - 12/20/2018 Neo-Adjuvant Chemotherapy   Neoadjuvant chemotherapy with dose dense Adriamycin and Cytoxan every 2 weeks x4 followed by Taxol weekly x12 (stopped after cycle 3 due to severe neuropathy)   02/01/2019 Breast MRI   Known malignancy in left breast measures 4.7 x 3.5 x 7.2cm, previously 5 x 3 x 10cm.    02/22/2019 Surgery   Left lumpectomy ([facility]): IDC with DCIS, 5.8cm, HER2 negative, ER 90%, PR 90%, Ki67 2%, clear margins,3/5 LN positive for malignancy.    03/02/2019 Cancer Staging   Staging form: Breast, AJCC 8th Edition - Pathologic stage from 03/02/2019: No Stage Recommended (ypT3, pN1a, cM0, G2, ER+, PR+, HER2-) - Signed by Serena Croissant, MD on 03/02/2019   03/11/2019 Surgery   Left axillary lymph node dissection ([facility]): metastatic carcinoma in 3/14 lymph  nodes, focal extranodal involvement by tumor.   04/20/2019 - 06/02/2019 Radiation Therapy   Adjuvant radiation therapy   05/30/2019 -  Anti-estrogen oral therapy   Anastrozole 1 mg daily     CHIEF COMPLIANT: Follow-up on Verzenio and letrozole  HISTORY OF PRESENT ILLNESS:   History of Present Illness   The patient, who is currently undergoing cancer treatment, reports a good week with no treatment. She experienced a return of her appetite and has been eating well. She has been experiencing side effects from her treatment, including stomach rumbling, headache, fatigue, and nausea. However, she reports only needing to take Zofran once for nausea and has been managing her headache with extra strength Tylenol. She also reports feeling achy and attributes this to reduced physical activity due to the cold weather.  In addition to her cancer treatment, the patient has been dealing with foot issues. She has thick toenails and puffiness in her feet, which she believes are side effects of her chemotherapy. She was scheduled for a foot surgery, but after discussing the details of the procedure with her surgeon, she has decided to postpone the major part of the surgery and only proceed with the removal of her thick toenails. She expresses concern about the impact of the surgery on her mobility and her ability to manage her cancer treatment.         ALLERGIES:  has No Known Allergies.  MEDICATIONS:  Current Outpatient Medications  Medication Sig Dispense Refill   abemaciclib (VERZENIO) 100 MG tablet Take 1 tablet (100 mg total) by mouth 2 (two) times daily. 60 tablet 3   atorvastatin (  LIPITOR) 10 MG tablet Take 1 tablet by mouth daily.     carvedilol (COREG) 6.25 MG tablet TAKE 1 TABLET BY MOUTH TWICE DAILY (Patient taking differently: Take 6.25 mg by mouth 2 (two) times daily with a meal.) 180 tablet 1   diclofenac Sodium (VOLTAREN) 1 % GEL Apply 4 g topically 4 (four) times daily. 100 g 4   letrozole  (FEMARA) 2.5 MG tablet Take 1 tablet (2.5 mg total) by mouth daily. 90 tablet 3   lisinopril-hydrochlorothiazide (ZESTORETIC) 10-12.5 MG tablet Take 1 tablet by mouth daily.     ondansetron (ZOFRAN) 4 MG tablet Take 1 tablet (4 mg total) by mouth every 8 (eight) hours as needed for nausea or vomiting. (Patient not taking: Reported on 09/08/2023) 20 tablet 0   pantoprazole (PROTONIX) 40 MG tablet TAKE 1 TABLET BY MOUTH EVERY DAY 90 tablet 1   Vitamin D, Ergocalciferol, (DRISDOL) 1.25 MG (50000 UNIT) CAPS capsule Take 1 capsule (50,000 Units total) by mouth every 7 (seven) days. 8 capsule 0   No current facility-administered medications for this visit.   Facility-Administered Medications Ordered in Other Visits  Medication Dose Route Frequency Provider Last Rate Last Admin   gadopentetate dimeglumine (MAGNEVIST) injection 20 mL  20 mL Intravenous Once PRN Anson Fret, MD       gadopentetate dimeglumine (MAGNEVIST) injection 20 mL  20 mL Intravenous Once PRN Anson Fret, MD        PHYSICAL EXAMINATION: ECOG PERFORMANCE STATUS: 1 - Symptomatic but completely ambulatory  Vitals:   09/17/23 1307  BP: 130/80  Pulse: 79  Resp: 18  Temp: (!) 97.4 F (36.3 C)  SpO2: 100%   Filed Weights   09/17/23 1307  Weight: 283 lb 4.8 oz (128.5 kg)    Physical Exam   EXTREMITIES: Edema present in ankles.        LABORATORY DATA:  I have reviewed the data as listed    Latest Ref Rng & Units 09/17/2023   12:42 PM 09/08/2023    1:20 PM 08/17/2023   11:52 AM  CMP  Glucose 70 - 99 mg/dL 161  096  90   BUN 8 - 23 mg/dL 12  17  20    Creatinine 0.44 - 1.00 mg/dL 0.45  4.09  8.11   Sodium 135 - 145 mmol/L 140  140  141   Potassium 3.5 - 5.1 mmol/L 3.6  3.6  3.9   Chloride 98 - 111 mmol/L 104  107  105   CO2 22 - 32 mmol/L 31  28  31    Calcium 8.9 - 10.3 mg/dL 9.7  9.6  9.7   Total Protein 6.5 - 8.1 g/dL 7.2  7.4  7.4   Total Bilirubin <1.2 mg/dL 0.4  0.4  0.4   Alkaline Phos 38 - 126 U/L  70  71  73   AST 15 - 41 U/L 13  13  13    ALT 0 - 44 U/L 10  11  12      Lab Results  Component Value Date   WBC 3.4 (L) 09/17/2023   HGB 11.4 (L) 09/17/2023   HCT 35.4 (L) 09/17/2023   MCV 88.3 09/17/2023   PLT 135 (L) 09/17/2023   NEUTROABS 2.0 09/17/2023    ASSESSMENT & PLAN:  Malignant neoplasm of upper-outer quadrant of left breast in female, estrogen receptor positive (HCC) 10/22/2018: Screening detected left breast architectural distortion: 4.5 cm, UOQ, by ultrasound measured 5 cm at 1 o'clock position multiple enlarged  lymph nodes, biopsy revealed 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  AX L&D 03/11/2019: 3/14 lymph nodes positive with focal extranodal involvement [id number]   Treatment plan: 1.  Neoadjuvant chemotherapy with dose dense Adriamycin and Cytoxan every 2 weeks x4 followed by Taxol weekly x 3 discontinued due to severe peripheral neuropathy completed 01/20/2019 2.  Followed by mastectomy on 02/22/2019 3.  Followed by radiation 04/20/2019-06/02/2019 4.  Followed by antiestrogen therapy. 06/02/2019 5. 07/15/22: Lung nodules on CT scan. 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 PET CT 07/31/23: 3 small Rt pleural based nodules hypermetabolic, hypermet Rt hilar LN ----------------------------------------------------------------------------------- Current treatment: Letrozole with Verzenio started 08/14/2023 Guardant360 09/16/2023: No ESR 1 mutation.  MSI high not detected  Verzinio toxicities: Mild headaches Mild nausea Abdominal cramps: No  diarrhea Slight leukopenia Mild thrombocytopenia  Based on the above symptoms we decided to maintain the dosage of 100 p.o. twice daily. Our plan is to obtain CT CAP end of January.  Surgical plans in the foot have changed.  She is getting her nails removed on 09/18/2023.  Extensive foot surgery has been canceled for the time being.  Therefore she will restart Verzinio on Saturday.  Foot Surgery Scheduled for toenail removal due to discomfort and pulling sensation related to neuropathy. More extensive foot surgery involving bone graft and fusion postponed due to patient's preference and current focus on cancer treatment. -Proceed with toenail removal surgery on 09/18/2023. -Resume Verzenio on 09/19/2023 post-surgery. -Consider more extensive foot surgery in the future if necessary and when patient's condition allows.   Plan to obtain scans towards the end of January 2025   Orders Placed This Encounter  Procedures   CT CHEST ABDOMEN PELVIS W CONTRAST    Standing Status:   Future    Standing Expiration Date:   09/16/2024    Order Specific Question:   If indicated for the ordered procedure, I authorize the administration of contrast media per Radiology protocol    Answer:   Yes    Order Specific Question:   Does the patient have a contrast media/X-ray dye allergy?    Answer:   No    Order Specific Question:   Preferred imaging location?    Answer:   [facility]    Order Specific Question:   Release to patient    Answer:   Immediate    Order Specific Question:   If indicated for the ordered procedure, I authorize the administration of oral contrast media per Radiology protocol    Answer:   Yes   CBC with Differential (Cancer Center Only)    Standing Status:   Future    Standing Expiration Date:   09/16/2024   CMP (Cancer Center only)    Standing Status:   Future    Standing Expiration Date:   09/16/2024   The patient has a good understanding of the overall plan. she agrees  with it. she will call with any problems that may develop before the next visit here. Total time spent: 30 mins including face to face time and time spent for planning,  charting and co-ordination of care   Tamsen Meek, MD 09/17/23

## 2023-09-17 NOTE — Telephone Encounter (Signed)
DOS-09/18/23  EXC. NAIL PERM BILAT-[id number]  [employer] EFFECTIVE DATE- 10/13/22  DEDUCTIBLE- $1500.00 WITH REMAINING $0.00 OOP- $5900.00 WITH REMAINING $0.00 COINSURANCE- 30%  SPOKE WITH TONYA M. FROM [employer] AND SHE STATED THAT PRIOR AUTH IS NOT REQUIRED FOR CPT CODE 28413.  CALL REFERENCE NUMBER: TONYA M. 09/17/23 @ 10:33 AM EST   [employer] EFFECTIVE DATE- 04/13/23  DEDUCTIBLE- $0.00 WITH REMAINING $0.00 OOP- $9400.00 WITH REMAINING $7,610.03  COINSURANCE- 0%  PER THE [employer] AUTOMATED SYSTEM, PRIOR AUTH IS NOT REQUIRED FOR CPT CODE 24401.  CALL REFERENCE NUMBER: [id number]

## 2023-09-18 ENCOUNTER — Other Ambulatory Visit: Payer: Self-pay | Admitting: Podiatry

## 2023-09-18 ENCOUNTER — Encounter: Payer: Self-pay | Admitting: Podiatry

## 2023-09-18 DIAGNOSIS — L600 Ingrowing nail: Secondary | ICD-10-CM | POA: Diagnosis not present

## 2023-09-18 MED ORDER — CEPHALEXIN 500 MG PO CAPS: 500.0000 mg | ORAL_CAPSULE | Freq: Three times a day (TID) | ORAL | 0 refills | Status: AC

## 2023-09-18 MED ORDER — TRAMADOL HCL 50 MG PO TABS: 50.0000 mg | ORAL_TABLET | Freq: Four times a day (QID) | ORAL | 0 refills | Status: AC | PRN

## 2023-09-18 NOTE — Progress Notes (Signed)
12/6 bilateral great toe perm nail excision

## 2023-09-21 ENCOUNTER — Encounter: Payer: Self-pay | Admitting: Hematology and Oncology

## 2023-09-21 ENCOUNTER — Other Ambulatory Visit: Payer: BC Managed Care – PPO

## 2023-09-21 ENCOUNTER — Ambulatory Visit: Payer: BC Managed Care – PPO | Admitting: Hematology and Oncology

## 2023-09-24 ENCOUNTER — Encounter: Payer: BC Managed Care – PPO | Admitting: Podiatry

## 2023-09-24 LAB — GUARDANT 360

## 2023-09-27 ENCOUNTER — Other Ambulatory Visit: Payer: Self-pay | Admitting: Podiatry

## 2023-10-01 ENCOUNTER — Ambulatory Visit: Payer: BC Managed Care – PPO | Admitting: Podiatry

## 2023-10-01 DIAGNOSIS — M2141 Flat foot [pes planus] (acquired), right foot: Secondary | ICD-10-CM

## 2023-10-01 DIAGNOSIS — L600 Ingrowing nail: Secondary | ICD-10-CM

## 2023-10-01 DIAGNOSIS — M2142 Flat foot [pes planus] (acquired), left foot: Secondary | ICD-10-CM

## 2023-10-01 DIAGNOSIS — M7672 Peroneal tendinitis, left leg: Secondary | ICD-10-CM

## 2023-10-01 DIAGNOSIS — M19072 Primary osteoarthritis, left ankle and foot: Secondary | ICD-10-CM | POA: Diagnosis not present

## 2023-10-01 NOTE — Progress Notes (Signed)
  Subjective:  Patient ID: [name], female    DOB: 07/27/1961,  MRN: [id number]  Chief Complaint  Patient presents with   Routine Post Op    POV # 1 DOS 12/624 --- EXCISION OF INGROWN TOE NAILS BILAT, she is doing well but feels soreness in her toes.     62 y.o. female returns for post-op check.  Feeling much better after changing the dressing  Review of Systems: Negative except as noted in the HPI. Denies N/V/F/Ch.   Objective:  There were no vitals filed for this visit. There is no height or weight on file to calculate BMI. Constitutional Well developed. Well nourished.  Vascular Foot warm and well perfused. Capillary refill normal to all digits.  Calf is soft and supple, no posterior calf or knee pain, negative Homans' sign  Neurologic Normal speech. Oriented to person, place, and time. Epicritic sensation to light touch grossly present bilaterally.  Dermatologic Skin healing well without signs of infection. Skin edges well coapted without signs of infection.  Orthopedic: Tenderness to palpation noted about the surgical site.   Assessment:   1. Ingrowing right great toenail   2. Ingrowing left great toenail   3. Arthritis of left midfoot   4. Peroneal tendinitis of left lower extremity   5. Pes planus of both feet    Plan:  Patient was evaluated and treated and all questions answered.  S/p foot surgery bilaterally -Progressing as expected post-operatively.  Sutures removed uneventfully.  Silvadene and bandage applied.  She also met with her orthotist today to be scanned for custom molded foot orthoses for nonoperative treatment of her midfoot arthritis.   Return in about 5 weeks (around 11/05/2023) for follow up from nail surgery .

## 2023-10-02 NOTE — Progress Notes (Unsigned)
 Orthotic eval   Patient was seen, measured / scanned for custom molded foot orthotics.  Patient will benefit from CFO's as they will help provide total contact to MLA's helping to better distribute body weight across BIL feet greater reducing plantar pressure and pain and to also encourage FF and RF alignment.  Patient was scanned items to be ordered and fit when in  [name] CPed,CFo, CFm

## 2023-10-05 ENCOUNTER — Inpatient Hospital Stay: Payer: BC Managed Care – PPO | Admitting: Pharmacist

## 2023-10-05 ENCOUNTER — Inpatient Hospital Stay: Payer: BC Managed Care – PPO

## 2023-10-05 VITALS — BP 138/62 | HR 81 | Temp 97.7°F | Resp 18 | Ht 74.0 in | Wt 278.1 lb

## 2023-10-05 DIAGNOSIS — Z170 Estrogen receptor positive status [ER+]: Secondary | ICD-10-CM

## 2023-10-05 DIAGNOSIS — C50412 Malignant neoplasm of upper-outer quadrant of left female breast: Secondary | ICD-10-CM | POA: Diagnosis not present

## 2023-10-05 LAB — CBC WITH DIFFERENTIAL (CANCER CENTER ONLY)
Abs Immature Granulocytes: 0.02 10*3/uL (ref 0.00–0.07)
Basophils Absolute: 0 10*3/uL (ref 0.0–0.1)
Basophils Relative: 1 %
Eosinophils Absolute: 0.1 10*3/uL (ref 0.0–0.5)
Eosinophils Relative: 3 %
HCT: 32.7 % — ABNORMAL LOW (ref 36.0–46.0)
Hemoglobin: 11.1 g/dL — ABNORMAL LOW (ref 12.0–15.0)
Immature Granulocytes: 1 %
Lymphocytes Relative: 34 %
Lymphs Abs: 1.5 10*3/uL (ref 0.7–4.0)
MCH: 29.6 pg (ref 26.0–34.0)
MCHC: 33.9 g/dL (ref 30.0–36.0)
MCV: 87.2 fL (ref 80.0–100.0)
Monocytes Absolute: 0.4 10*3/uL (ref 0.1–1.0)
Monocytes Relative: 9 %
Neutro Abs: 2.3 10*3/uL (ref 1.7–7.7)
Neutrophils Relative %: 52 %
Platelet Count: 215 10*3/uL (ref 150–400)
RBC: 3.75 MIL/uL — ABNORMAL LOW (ref 3.87–5.11)
RDW: 13 % (ref 11.5–15.5)
WBC Count: 4.3 10*3/uL (ref 4.0–10.5)
nRBC: 0 % (ref 0.0–0.2)

## 2023-10-05 LAB — CMP (CANCER CENTER ONLY)
ALT: 10 U/L (ref 0–44)
AST: 13 U/L — ABNORMAL LOW (ref 15–41)
Albumin: 3.8 g/dL (ref 3.5–5.0)
Alkaline Phosphatase: 66 U/L (ref 38–126)
Anion gap: 4 — ABNORMAL LOW (ref 5–15)
BUN: 18 mg/dL (ref 8–23)
CO2: 30 mmol/L (ref 22–32)
Calcium: 9.3 mg/dL (ref 8.9–10.3)
Chloride: 106 mmol/L (ref 98–111)
Creatinine: 1.28 mg/dL — ABNORMAL HIGH (ref 0.44–1.00)
GFR, Estimated: 47 mL/min — ABNORMAL LOW (ref >60)
Glucose, Bld: 94 mg/dL (ref 70–99)
Potassium: 3.8 mmol/L (ref 3.5–5.1)
Sodium: 140 mmol/L (ref 135–145)
Total Bilirubin: 0.4 mg/dL (ref <1.2)
Total Protein: 6.7 g/dL (ref 6.5–8.1)

## 2023-10-05 NOTE — Progress Notes (Signed)
[facility]       Telephone: [phone]?Fax: [phone]   Oncology Clinical Pharmacist Practitioner Progress Note  [name] was contacted via in-person to discuss her chemotherapy regimen for abemaciclib which they receive under the care of Dr. Serena Croissant.  Current treatment regimen and start date Abemaciclib (08/24/23) Letrozole (08/14/23)  Interval History She continues on abemaciclib 100 mg by mouth every 12 hours on days 1 to 28 of a 28-day cycle. This is being given in combination with letrozole . Therapy is planned to continue until disease progression or unacceptable toxicity. [name] was seen today by clinical pharmacy as a follow up to her abemaciclib management. She was last seen by clinical pharmacy on 09/08/23 and Dr. Pamelia Hoit on 09/17/23. At that visit, she reported that she had been off abemaciclib from 09/11/23 due to foot surgery and that she would be restarting on 09/20/23.   Response to Therapy [name] is doing well. She is still having "queasiness" but no vomiting. She takes ondansetron sparingly. She prefers to use a nausea wristband and ginger tea. She has started taking her abemaciclib at 7:30 am and 7:30 pm as she had missed a couple of morning doses prior. Dr. Pamelia Hoit has ordered restaging scans for 11/02/22 and today we gave her information on radiology scheduling. We will add labs and Dr. Pamelia Hoit visit for 11/08/22. Dr. Pamelia Hoit will likely have clinical pharmacy see her back with labs sometime after that visit.   We discussed increasing the dose of abemaciclib but because she is still having quite a bit of nausea, she prefers to wait for now. Labs, vitals, treatment parameters, and manufacturer guidelines assessing toxicity were reviewed with [name] today. Based on these values, patient is in agreement to continue abemaciclib therapy at this time.  Allergies No Known Allergies  Vitals    09/17/2023    1:07 PM  09/08/2023    2:02 PM 08/14/2023   12:32 PM  Oncology Vitals  Height 188 cm 188 cm 188 cm  Weight 128.504 kg 126.916 kg 129.547 kg  Weight (lbs) 283 lbs 5 oz 279 lbs 13 oz 285 lbs 10 oz  BMI 36.37 kg/m2 35.92 kg/m2 36.67 kg/m2  Temp 97.4 F (36.3 C) 98.3 F (36.8 C) 97.9 F (36.6 C)  Pulse Rate 79 76 84  BP 130/80 110/71 134/78  Resp 18 18 16   SpO2 100 % 100 % 100 %  BSA (m2) 2.59 m2 2.57 m2 2.6 m2    Laboratory Data    Latest Ref Rng & Units 09/17/2023   12:42 PM 09/08/2023    1:20 PM 08/17/2023   11:52 AM  CBC EXTENDED  WBC 4.0 - 10.5 K/uL 3.4  3.4  5.1   RBC 3.87 - 5.11 MIL/uL 4.01  4.21  4.13   Hemoglobin 12.0 - 15.0 g/dL 29.5  62.1  30.8   HCT 36.0 - 46.0 % 35.4  37.1  35.9   Platelets 150 - 400 K/uL 135  166  170   NEUT# 1.7 - 7.7 K/uL 2.0  1.8  3.2   Lymph# 0.7 - 4.0 K/uL 1.1  1.2  1.4        Latest Ref Rng & Units 09/17/2023   12:42 PM 09/08/2023    1:20 PM 08/17/2023   11:52 AM  CMP  Glucose 70 - 99 mg/dL 657  846  90   BUN 8 - 23 mg/dL 12  17  20    Creatinine 0.44 -  1.00 mg/dL 4.69  6.29  5.28   Sodium 135 - 145 mmol/L 140  140  141   Potassium 3.5 - 5.1 mmol/L 3.6  3.6  3.9   Chloride 98 - 111 mmol/L 104  107  105   CO2 22 - 32 mmol/L 31  28  31    Calcium 8.9 - 10.3 mg/dL 9.7  9.6  9.7   Total Protein 6.5 - 8.1 g/dL 7.2  7.4  7.4   Total Bilirubin <1.2 mg/dL 0.4  0.4  0.4   Alkaline Phos 38 - 126 U/L 70  71  73   AST 15 - 41 U/L 13  13  13    ALT 0 - 44 U/L 10  11  12      Adverse Effects Assessment Hgb: slightly decreased from last visit Scr: slightly increased from last visit  Adherence Assessment Meliza Bierce [name] reports missing 2 doses over the past 2 weeks.   Reason for missed dose: slept late Patient was re-educated on importance of adherence.   Access Assessment Margaret Stobie [name] is currently receiving her abemaciclib through [facility] concerns:  none  Medication Reconciliation The patient's  medication list was reviewed today with the patient? Yes New medications or herbal supplements have recently been started? No  Any medications have been discontinued? No  The medication list was updated and reconciled based on the patient's most recent medication list in the electronic medical record (EMR) including herbal products and OTC medications.   Medications Current Outpatient Medications  Medication Sig Dispense Refill   abemaciclib (VERZENIO) 100 MG tablet Take 1 tablet (100 mg total) by mouth 2 (two) times daily. 60 tablet 3   atorvastatin (LIPITOR) 10 MG tablet Take 1 tablet by mouth daily.     carvedilol (COREG) 6.25 MG tablet TAKE 1 TABLET BY MOUTH TWICE DAILY (Patient taking differently: Take 6.25 mg by mouth 2 (two) times daily with a meal.) 180 tablet 1   diclofenac Sodium (VOLTAREN) 1 % GEL Apply 4 g topically 4 (four) times daily. 100 g 4   letrozole (FEMARA) 2.5 MG tablet Take 1 tablet (2.5 mg total) by mouth daily. 90 tablet 3   lisinopril-hydrochlorothiazide (ZESTORETIC) 10-12.5 MG tablet Take 1 tablet by mouth daily.     ondansetron (ZOFRAN) 4 MG tablet Take 1 tablet (4 mg total) by mouth every 8 (eight) hours as needed for nausea or vomiting. 20 tablet 0   pantoprazole (PROTONIX) 40 MG tablet TAKE 1 TABLET BY MOUTH EVERY DAY 90 tablet 1   Vitamin D, Ergocalciferol, (DRISDOL) 1.25 MG (50000 UNIT) CAPS capsule TAKE 1 CAPSULE (50,000 UNITS TOTAL) BY MOUTH EVERY 7 (SEVEN) DAYS 12 capsule 1   No current facility-administered medications for this visit.   Facility-Administered Medications Ordered in Other Visits  Medication Dose Route Frequency Provider Last Rate Last Admin   gadopentetate dimeglumine (MAGNEVIST) injection 20 mL  20 mL Intravenous Once PRN Anson Fret, MD       gadopentetate dimeglumine (MAGNEVIST) injection 20 mL  20 mL Intravenous Once PRN Anson Fret, MD        Drug-Drug Interactions (DDIs) DDIs were evaluated? Yes Significant DDIs? No  The  patient was instructed to speak with their health care provider and/or the oral chemotherapy pharmacist before starting any new drug, including prescription or over the counter, natural / herbal products, or vitamins.  Supportive Care Diarrhea: we reviewed that diarrhea is common with abemaciclib and confirmed that she does  have loperamide (Imodium) at home.  We reviewed how to take this medication PRN. Neutropenia: we discussed the importance of having a thermometer and what the Centers for Disease Control and Prevention (CDC) considers a fever which is 100.33F (38C) or higher.  Gave patient 24/7 triage line to call if any fevers or symptoms. ILD/Pneumonitis: we reviewed potential symptoms including cough, shortness, and fatigue.  VTE: reviewed signs of DVT such as leg swelling, redness, pain, or tenderness and signs of PE such as shortness of breath, rapid or irregular heartbeat, cough, chest pain, or lightheadedness. Reviewed to take the medication every 12 hours (with food sometimes can be easier on the stomach) and to take it at the same time every day. Hepatotoxicity:WNL Drug interactions with grapefruit products  Dosing Assessment Hepatic adjustments needed? No  Renal adjustments needed? No  Toxicity adjustments needed? No  The current dosing regimen is appropriate to continue at this time.  Follow-Up Plan Continue abemaciclib 100 mg by mouth every 12 hours. Consider increasing dose at next visit with Dr. Pamelia Hoit Continue letrozole 2.5 mg by mouth daily Monitor serum creatinine, Hgb Vitamin D level ordered per patient request. Had been low in past but has normalized as of last result Use ondansetron as needed for nausea  Restaging scans ordered by Dr. Pamelia Hoit expected 11/03/23. Information given on scheduling today See Dr. Pamelia Hoit with labs on 11/09/23. Follow up with clinical pharmacy as needed after seeing Dr. Hurley Cisco [name] participated in the discussion, expressed  understanding, and voiced agreement with the above plan. All questions were answered to her satisfaction. The patient was advised to contact the clinic at [phone] with any questions or concerns prior to her return visit.   I spent 30 minutes assessing and educating the patient.  Kenneth Cuaresma A. Odetta Pink, PharmD, BCOP, CPP  Anselm Lis, RPH-CPP, 10/05/2023  2:14 PM   **Disclaimer: This note was dictated with voice recognition software. Similar sounding words can inadvertently be transcribed and this note may contain transcription errors which may not have been corrected upon publication of note.**

## 2023-10-09 DIAGNOSIS — Z6835 Body mass index (BMI) 35.0-35.9, adult: Secondary | ICD-10-CM | POA: Diagnosis not present

## 2023-10-09 DIAGNOSIS — C50919 Malignant neoplasm of unspecified site of unspecified female breast: Secondary | ICD-10-CM | POA: Diagnosis not present

## 2023-10-09 DIAGNOSIS — D84821 Immunodeficiency due to drugs: Secondary | ICD-10-CM | POA: Diagnosis not present

## 2023-10-09 DIAGNOSIS — Z8249 Family history of ischemic heart disease and other diseases of the circulatory system: Secondary | ICD-10-CM | POA: Diagnosis not present

## 2023-10-09 DIAGNOSIS — E785 Hyperlipidemia, unspecified: Secondary | ICD-10-CM | POA: Diagnosis not present

## 2023-10-09 DIAGNOSIS — Z79811 Long term (current) use of aromatase inhibitors: Secondary | ICD-10-CM | POA: Diagnosis not present

## 2023-10-09 DIAGNOSIS — M1990 Unspecified osteoarthritis, unspecified site: Secondary | ICD-10-CM | POA: Diagnosis not present

## 2023-10-09 DIAGNOSIS — K219 Gastro-esophageal reflux disease without esophagitis: Secondary | ICD-10-CM | POA: Diagnosis not present

## 2023-10-09 DIAGNOSIS — I10 Essential (primary) hypertension: Secondary | ICD-10-CM | POA: Diagnosis not present

## 2023-10-09 DIAGNOSIS — C7800 Secondary malignant neoplasm of unspecified lung: Secondary | ICD-10-CM | POA: Diagnosis not present

## 2023-10-09 DIAGNOSIS — Z823 Family history of stroke: Secondary | ICD-10-CM | POA: Diagnosis not present

## 2023-10-12 ENCOUNTER — Encounter: Payer: BC Managed Care – PPO | Admitting: Podiatry

## 2023-10-20 ENCOUNTER — Telehealth: Payer: Self-pay | Admitting: *Deleted

## 2023-10-20 NOTE — Telephone Encounter (Signed)
 Received call from pt requesting advice from MD if okay to proceed with regular dental cleaning while on Verzenio.  Per MD okay to proceed, pt needing to alert our office if any major dental surgery is needed. Pt verbalized understanding.

## 2023-10-22 ENCOUNTER — Ambulatory Visit: Payer: Self-pay | Admitting: Podiatry

## 2023-10-27 ENCOUNTER — Ambulatory Visit (HOSPITAL_COMMUNITY)
Admission: RE | Admit: 2023-10-27 | Discharge: 2023-10-27 | Disposition: A | Discharge status: Home / Self Care | Payer: 59 | Source: Ambulatory Visit | Attending: Hematology and Oncology | Admitting: Hematology and Oncology

## 2023-10-27 DIAGNOSIS — C50412 Malignant neoplasm of upper-outer quadrant of left female breast: Secondary | ICD-10-CM | POA: Diagnosis not present

## 2023-10-27 DIAGNOSIS — Z170 Estrogen receptor positive status [ER+]: Secondary | ICD-10-CM | POA: Diagnosis not present

## 2023-10-27 DIAGNOSIS — R918 Other nonspecific abnormal finding of lung field: Secondary | ICD-10-CM | POA: Diagnosis not present

## 2023-10-27 DIAGNOSIS — K449 Diaphragmatic hernia without obstruction or gangrene: Secondary | ICD-10-CM | POA: Diagnosis not present

## 2023-10-27 DIAGNOSIS — I700 Atherosclerosis of aorta: Secondary | ICD-10-CM | POA: Diagnosis not present

## 2023-10-27 MED ORDER — IOHEXOL 300 MG/ML  SOLN
80.0000 mL | Freq: Once | INTRAMUSCULAR | Status: AC | PRN
  Administered 2023-10-27: 100 mL via INTRAVENOUS

## 2023-11-03 ENCOUNTER — Encounter: Payer: BC Managed Care – PPO | Admitting: Podiatry

## 2023-11-05 ENCOUNTER — Ambulatory Visit (INDEPENDENT_AMBULATORY_CARE_PROVIDER_SITE_OTHER): Payer: 59 | Admitting: Podiatry

## 2023-11-05 ENCOUNTER — Encounter: Payer: Self-pay | Admitting: Podiatry

## 2023-11-05 VITALS — Ht 74.0 in | Wt 278.0 lb

## 2023-11-05 DIAGNOSIS — L600 Ingrowing nail: Secondary | ICD-10-CM

## 2023-11-05 DIAGNOSIS — M19072 Primary osteoarthritis, left ankle and foot: Secondary | ICD-10-CM | POA: Diagnosis not present

## 2023-11-05 DIAGNOSIS — G620 Drug-induced polyneuropathy: Secondary | ICD-10-CM

## 2023-11-05 NOTE — Progress Notes (Signed)
  Subjective:  Patient ID: [name], female    DOB: 06-28-1961,  MRN: [id number]  Chief Complaint  Patient presents with   Nail Problem    POV #2 DOS 12/624 --- EXCISION OF INGROWN TOE NAILS BILAT, she reports she still has soreness.     63 y.o. female returns for post-op check.  She is doing well not having pain there is still persistent scab that keeps hanging around, feet are feeling more numb from the neuropathy, she has been using the cream but has not noticed much of a difference  Review of Systems: Negative except as noted in the HPI. Denies N/V/F/Ch.   Objective:  There were no vitals filed for this visit. Body mass index is 35.69 kg/m. Constitutional Well developed. Well nourished.  Vascular Foot warm and well perfused. Capillary refill normal to all digits.  Calf is soft and supple, no posterior calf or knee pain, negative Homans' sign  Neurologic Normal speech. Oriented to person, place, and time. Epicritic sensation to light touch grossly present bilaterally.  Dermatologic Matricectomy's are healing well there is persistent scab present  Orthopedic: Little to no pain to palpation noted about the surgical site on the toenails, minimal in the midfoot today..   Assessment:   1. Ingrowing right great toenail   2. Ingrowing left great toenail   3. Arthritis of left midfoot   4. Chemotherapy-induced neuropathy (HCC)    Plan:  Patient was evaluated and treated and all questions answered.  S/p foot surgery bilaterally -Having some persistent scab I recommended soaking once or twice a week and debriding this gently with a brush to alleviate this and should heal uneventfully.  Her orthotics were ready for dispensation today and they were assessed for form fit and function and I think these will be a good long-term solution for her.  I will follow-up with her in 2 months to see how she is doing.  Neuropathy seems to be worsening and topical medications have not  helped but mostly numbness and not pain.  Will hold off on AFO brace for now to give the orthotics time to see if they are beneficial, she will let me know if they need any adjustments   Return in about 2 months (around 01/03/2024) for follow up on arthritis, toenail removal .

## 2023-11-09 ENCOUNTER — Inpatient Hospital Stay: Payer: 59 | Admitting: Hematology and Oncology

## 2023-11-09 ENCOUNTER — Inpatient Hospital Stay: Payer: 59 | Attending: Hematology and Oncology

## 2023-11-09 ENCOUNTER — Other Ambulatory Visit (HOSPITAL_COMMUNITY): Payer: Self-pay

## 2023-11-09 VITALS — BP 118/78 | HR 84 | Temp 97.7°F | Resp 18 | Ht 74.0 in | Wt 278.6 lb

## 2023-11-09 DIAGNOSIS — C50412 Malignant neoplasm of upper-outer quadrant of left female breast: Secondary | ICD-10-CM

## 2023-11-09 DIAGNOSIS — R519 Headache, unspecified: Secondary | ICD-10-CM | POA: Diagnosis not present

## 2023-11-09 DIAGNOSIS — D696 Thrombocytopenia, unspecified: Secondary | ICD-10-CM | POA: Diagnosis not present

## 2023-11-09 DIAGNOSIS — Z79899 Other long term (current) drug therapy: Secondary | ICD-10-CM | POA: Diagnosis not present

## 2023-11-09 DIAGNOSIS — Z79811 Long term (current) use of aromatase inhibitors: Secondary | ICD-10-CM | POA: Insufficient documentation

## 2023-11-09 DIAGNOSIS — Z170 Estrogen receptor positive status [ER+]: Secondary | ICD-10-CM | POA: Diagnosis not present

## 2023-11-09 DIAGNOSIS — G629 Polyneuropathy, unspecified: Secondary | ICD-10-CM | POA: Diagnosis not present

## 2023-11-09 DIAGNOSIS — R109 Unspecified abdominal pain: Secondary | ICD-10-CM | POA: Diagnosis not present

## 2023-11-09 DIAGNOSIS — C7801 Secondary malignant neoplasm of right lung: Secondary | ICD-10-CM | POA: Insufficient documentation

## 2023-11-09 DIAGNOSIS — D72819 Decreased white blood cell count, unspecified: Secondary | ICD-10-CM | POA: Insufficient documentation

## 2023-11-09 DIAGNOSIS — R110 Nausea: Secondary | ICD-10-CM | POA: Insufficient documentation

## 2023-11-09 DIAGNOSIS — Z1721 Progesterone receptor positive status: Secondary | ICD-10-CM | POA: Insufficient documentation

## 2023-11-09 DIAGNOSIS — M25529 Pain in unspecified elbow: Secondary | ICD-10-CM | POA: Insufficient documentation

## 2023-11-09 LAB — CMP (CANCER CENTER ONLY)
ALT: 9 U/L (ref 0–44)
AST: 12 U/L — ABNORMAL LOW (ref 15–41)
Albumin: 4 g/dL (ref 3.5–5.0)
Alkaline Phosphatase: 60 U/L (ref 38–126)
Anion gap: 4 — ABNORMAL LOW (ref 5–15)
BUN: 20 mg/dL (ref 8–23)
CO2: 30 mmol/L (ref 22–32)
Calcium: 9.9 mg/dL (ref 8.9–10.3)
Chloride: 104 mmol/L (ref 98–111)
Creatinine: 1.33 mg/dL — ABNORMAL HIGH (ref 0.44–1.00)
GFR, Estimated: 45 mL/min — ABNORMAL LOW (ref >60)
Glucose, Bld: 91 mg/dL (ref 70–99)
Potassium: 3.8 mmol/L (ref 3.5–5.1)
Sodium: 138 mmol/L (ref 135–145)
Total Bilirubin: 0.5 mg/dL (ref 0.0–1.2)
Total Protein: 7.4 g/dL (ref 6.5–8.1)

## 2023-11-09 LAB — CBC WITH DIFFERENTIAL (CANCER CENTER ONLY)
Abs Immature Granulocytes: 0.01 10*3/uL (ref 0.00–0.07)
Basophils Absolute: 0 10*3/uL (ref 0.0–0.1)
Basophils Relative: 1 %
Eosinophils Absolute: 0.1 10*3/uL (ref 0.0–0.5)
Eosinophils Relative: 2 %
HCT: 35 % — ABNORMAL LOW (ref 36.0–46.0)
Hemoglobin: 11.4 g/dL — ABNORMAL LOW (ref 12.0–15.0)
Immature Granulocytes: 0 %
Lymphocytes Relative: 38 %
Lymphs Abs: 1.2 10*3/uL (ref 0.7–4.0)
MCH: 29.6 pg (ref 26.0–34.0)
MCHC: 32.6 g/dL (ref 30.0–36.0)
MCV: 90.9 fL (ref 80.0–100.0)
Monocytes Absolute: 0.2 10*3/uL (ref 0.1–1.0)
Monocytes Relative: 7 %
Neutro Abs: 1.7 10*3/uL (ref 1.7–7.7)
Neutrophils Relative %: 52 %
Platelet Count: 161 10*3/uL (ref 150–400)
RBC: 3.85 MIL/uL — ABNORMAL LOW (ref 3.87–5.11)
RDW: 13.6 % (ref 11.5–15.5)
WBC Count: 3.3 10*3/uL — ABNORMAL LOW (ref 4.0–10.5)
nRBC: 0 % (ref 0.0–0.2)

## 2023-11-09 LAB — VITAMIN D 25 HYDROXY (VIT D DEFICIENCY, FRACTURES): Vit D, 25-Hydroxy: 67.49 ng/mL (ref 30–100)

## 2023-11-09 MED ORDER — ABEMACICLIB 150 MG PO TABS: 150.0000 mg | ORAL_TABLET | Freq: Two times a day (BID) | ORAL | 1 refills | Status: DC

## 2023-11-09 NOTE — Assessment & Plan Note (Signed)
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  [facility] L&D 03/11/2019: 3/14 lymph nodes positive with focal extranodal involvement [id number]   Treatment plan: 1.  Neoadjuvant chemotherapy with dose dense Adriamycin and Cytoxan every 2 weeks x4 followed by Taxol weekly x 3 discontinued due to severe peripheral neuropathy completed 01/20/2019 2.  Followed by mastectomy on 02/22/2019 3.  Followed by radiation 04/20/2019-06/02/2019 4.  Followed by antiestrogen therapy. 06/02/2019 5. 07/15/22: Lung nodules on CT scan. 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 PET CT 07/31/23: 3 small Rt pleural based nodules hypermetabolic, hypermet Rt hilar LN ----------------------------------------------------------------------------------- Current treatment: Letrozole with Verzenio started 08/14/2023 Guardant360 09/16/2023: No ESR 1 mutation.  MSI high not detected   Verzinio toxicities: Mild headaches Mild nausea Abdominal cramps: No diarrhea Slight leukopenia Mild thrombocytopenia  10/27/2023: CT CAP: Multiple small right sided lung nodules diminished in size and some stable.

## 2023-11-09 NOTE — Progress Notes (Signed)
Patient Care Team: Elie Confer, NP as PCP - Earma Reading, MD as Consulting Physician (General Surgery) Serena Croissant, MD as Consulting Physician (Hematology and Oncology) Dorothy Puffer, MD as Consulting Physician (Radiation Oncology)  DIAGNOSIS:  Encounter Diagnosis  Name Primary?   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, lymph node biopsy positive, intramammary lymph node biopsy negative, ER 100%, PR 100%, Ki-67 20%, HER-2 1+ by IHC, T2 and 1 stage 2A   10/27/2018 Cancer Staging   Staging form: Breast, AJCC 8th Edition - Clinical: Stage IIA (cT2, cN1(f), cM0, G1, ER+, PR+, HER2-) - Signed by Serena Croissant, MD on 11/01/2018   11/11/2018 - 12/20/2018 Neo-Adjuvant Chemotherapy   Neoadjuvant chemotherapy with dose dense Adriamycin and Cytoxan every 2 weeks x4 followed by Taxol weekly x12 (stopped after cycle 3 due to severe neuropathy)   02/01/2019 Breast MRI   Known malignancy in left breast measures 4.7 x 3.5 x 7.2cm, previously 5 x 3 x 10cm.    02/22/2019 Surgery   Left lumpectomy ([facility]): IDC with DCIS, 5.8cm, HER2 negative, ER 90%, PR 90%, Ki67 2%, clear margins,3/5 LN positive for malignancy.    03/02/2019 Cancer Staging   Staging form: Breast, AJCC 8th Edition - Pathologic stage from 03/02/2019: No Stage Recommended (ypT3, pN1a, cM0, G2, ER+, PR+, HER2-) - Signed by Serena Croissant, MD on 03/02/2019   03/11/2019 Surgery   Left axillary lymph node dissection ([facility]): metastatic carcinoma in 3/14 lymph  nodes, focal extranodal involvement by tumor.   04/20/2019 - 06/02/2019 Radiation Therapy   Adjuvant radiation therapy   05/30/2019 -  Anti-estrogen oral therapy   Anastrozole 1 mg daily     CHIEF COMPLIANT: Follow-up of stage IV breast cancer on Verzenio and anastrozole  HISTORY OF PRESENT ILLNESS:  History of Present Illness   The patient, diagnosed with metastatic stage 4 cancer, presents with a chief complaint of sudden bowel urgency, occurring approximately twice daily. The patient describes the urgency as sudden and immediate, but denies the presence of diarrhea. The patient has experienced an episode of incontinence, which has led to significant distress and lifestyle modifications, including carrying an extra set of clothes.  In addition to bowel urgency, the patient reports joint pain, particularly in the hands and elbows. The pain is described as an ache, and it is noted that it takes a while to get things moving. The patient has not previously experienced this type of joint discomfort.  The patient has been on a regimen of cancer treatment, the specifics of which are not detailed in the conversation. However, the patient mentions seeing the term "diminished" in her chart, which she interprets as a positive sign of response to treatment. The patient also mentions a recent scan, which showed small nodules in the lungs that appear to be improving.  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 The patient expresses a desire to continue working if possible, but acknowledges the uncertainty of her situation.  ALLERGIES:  has no known allergies.  MEDICATIONS:  Current Outpatient Medications  Medication Sig Dispense Refill   abemaciclib (VERZENIO) 150 MG tablet Take 1 tablet (150 mg total) by mouth 2 (two) times daily. 60 tablet 1   atorvastatin  (LIPITOR) 10 MG tablet Take 1 tablet by mouth daily.     carvedilol (COREG) 6.25 MG tablet TAKE 1 TABLET BY MOUTH TWICE DAILY (Patient taking differently: Take 6.25 mg by mouth 2 (two) times daily with a meal.) 180 tablet 1   diclofenac Sodium (VOLTAREN) 1 % GEL Apply 4 g topically 4 (four) times daily. 100 g 4   letrozole (FEMARA) 2.5 MG tablet Take 1 tablet (2.5 mg total) by mouth daily. 90 tablet 3   lisinopril-hydrochlorothiazide (ZESTORETIC) 10-12.5 MG tablet Take 1 tablet by mouth daily.     ondansetron (ZOFRAN) 4 MG tablet Take 1 tablet (4 mg total) by mouth every 8 (eight) hours as needed for nausea or vomiting. 20 tablet 0   pantoprazole (PROTONIX) 40 MG tablet TAKE 1 TABLET BY MOUTH EVERY DAY 90 tablet 1   Vitamin D, Ergocalciferol, (DRISDOL) 1.25 MG (50000 UNIT) CAPS capsule TAKE 1 CAPSULE (50,000 UNITS TOTAL) BY MOUTH EVERY 7 (SEVEN) DAYS 12 capsule 1   No current facility-administered medications for this visit.   Facility-Administered Medications Ordered in Other Visits  Medication Dose Route Frequency Provider Last Rate Last Admin   gadopentetate dimeglumine (MAGNEVIST) injection 20 mL  20 mL Intravenous Once PRN Anson Fret, MD       gadopentetate dimeglumine (MAGNEVIST) injection 20 mL  20 mL Intravenous Once PRN Anson Fret, MD        PHYSICAL EXAMINATION: ECOG PERFORMANCE STATUS: 1 - Symptomatic but completely ambulatory  Vitals:   11/09/23 1157  BP: 118/78  Pulse: 84  Resp: 18  Temp: 97.7 F (36.5 C)  SpO2: 100%   Filed Weights   11/09/23 1157  Weight: 278 lb 9.6 oz (126.4 kg)    LABORATORY DATA:  I have reviewed the data as listed    Latest Ref Rng & Units 11/09/2023   11:29 AM 10/05/2023    1:54 PM 09/17/2023   12:42 PM  CMP  Glucose 70 - 99 mg/dL 91  94  657   BUN 8 - 23 mg/dL 20  18  12    Creatinine 0.44 - 1.00 mg/dL 8.46  9.62  9.52   Sodium 135 - 145 mmol/L 138  140  140   Potassium 3.5 - 5.1 mmol/L 3.8  3.8  3.6   Chloride 98 - 111  mmol/L 104  106  104   CO2 22 - 32 mmol/L 30  30  31    Calcium 8.9 - 10.3 mg/dL 9.9  9.3  9.7   Total Protein 6.5 - 8.1 g/dL 7.4  6.7  7.2   Total Bilirubin 0.0 - 1.2 mg/dL 0.5  0.4  0.4   Alkaline Phos 38 - 126 U/L 60  66  70   AST 15 - 41 U/L 12  13  13    ALT 0 - 44 U/L 9  10  10      Lab Results  Component Value Date   WBC 3.3 (L) 11/09/2023   HGB 11.4 (L) 11/09/2023   HCT 35.0 (L) 11/09/2023   MCV 90.9 11/09/2023   PLT 161 11/09/2023   NEUTROABS 1.7 11/09/2023    ASSESSMENT & PLAN:  Malignant neoplasm of upper-outer quadrant of left breast in female, estrogen receptor positive (HCC) Left  lumpectomy: 02/22/2019:([facility]): IDC with DCIS, 5.8cm, margins negative, lymphovascular invasion present HER2 negative, ER 90%, PR 90%, Ki67 2%, clear margins,3/5 LN positive for malignancy.  AX L&D 03/11/2019: 3/14 lymph nodes positive with focal extranodal involvement [id number]   Treatment plan: 1.  Neoadjuvant chemotherapy with dose dense Adriamycin and Cytoxan every 2 weeks x4 followed by Taxol weekly x 3 discontinued due to severe peripheral neuropathy completed 01/20/2019 2.  Followed by mastectomy on 02/22/2019 3.  Followed by radiation 04/20/2019-06/02/2019 4.  Followed by antiestrogen therapy. 06/02/2019 5. 07/15/22: Lung nodules on CT scan. 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 PET CT 07/31/23: 3 small Rt pleural based nodules hypermetabolic, hypermet Rt hilar LN ----------------------------------------------------------------------------------- Current treatment: Letrozole with Verzenio started 08/14/2023 Guardant360 09/16/2023: No ESR 1 mutation.  MSI high not detected   Verzinio toxicities: Mild headaches Mild nausea Abdominal cramps: No diarrhea Slight leukopenia Mild thrombocytopenia  10/27/2023: CT CAP: Multiple small right sided lung nodules diminished in size and some stable. ------------------------------------- Assessment and Plan    Metastatic Lung  Cancer Noted improvement in nodules on recent scan. Patient reports occasional urgency with bowel movements, but not diarrhea. Currently on 100mg  of medication, with discussion of increasing to 150mg . -Increase medication to 150mg  twice daily. -Monitor for increased side effects, particularly diarrhea, and adjust dose as necessary.  Joint Pain Patient reports achy joints, particularly in hands and elbows. This is a known side effect of current medication. -Consider switching medication timing to evening to potentially alleviate symptoms. -Consider switching to sister drug, anastrozole, if symptoms persist.  General Health Maintenance / Followup Plans -Plan for monthly follow-up visits given increase in medication dose. -Provide detailed note for patient's employer regarding her condition and treatment plan, with expectation of return to work when health allows.          Orders Placed This Encounter  Procedures   CBC with Differential (Cancer Center Only)    Standing Status:   Standing    Number of Occurrences:   20    Expiration Date:   11/08/2024   CMP (Cancer Center only)    Standing Status:   Standing    Number of Occurrences:   20    Expiration Date:   11/08/2024   [place] 27.29    Standing Status:   Standing    Number of Occurrences:   20    Expiration Date:   11/08/2024   The patient has a good understanding of the overall plan. she agrees with it. she will call with any problems that may develop before the next visit here. Total time spent: 30 mins including face to face time and time spent for planning, charting and co-ordination of care   Tamsen Meek, MD 11/09/23

## 2023-11-23 ENCOUNTER — Telehealth: Payer: Self-pay

## 2023-11-23 NOTE — Telephone Encounter (Signed)
 Pt called and requested call back regarding letter for her employer. She needs specific dates added to letter and sent to Chauncy Coral at [employer], provided fax number is [phone].  Called pt tp discuss what needs to be in letter. LVM for call back.

## 2023-11-24 ENCOUNTER — Encounter: Payer: Self-pay | Admitting: Hematology and Oncology

## 2023-11-24 ENCOUNTER — Encounter: Payer: Self-pay | Admitting: *Deleted

## 2023-11-24 NOTE — Progress Notes (Signed)
Per pt request RN successfully faxed letter to Verl Dicker at [phone].

## 2023-12-07 ENCOUNTER — Inpatient Hospital Stay: Payer: 59 | Attending: Hematology and Oncology

## 2023-12-07 DIAGNOSIS — C50412 Malignant neoplasm of upper-outer quadrant of left female breast: Secondary | ICD-10-CM | POA: Diagnosis present

## 2023-12-07 DIAGNOSIS — Z170 Estrogen receptor positive status [ER+]: Secondary | ICD-10-CM | POA: Diagnosis not present

## 2023-12-07 DIAGNOSIS — R918 Other nonspecific abnormal finding of lung field: Secondary | ICD-10-CM | POA: Insufficient documentation

## 2023-12-07 LAB — CMP (CANCER CENTER ONLY)
ALT: 13 U/L (ref 0–44)
AST: 17 U/L (ref 15–41)
Albumin: 3.9 g/dL (ref 3.5–5.0)
Alkaline Phosphatase: 68 U/L (ref 38–126)
Anion gap: 3 — ABNORMAL LOW (ref 5–15)
BUN: 16 mg/dL (ref 8–23)
CO2: 29 mmol/L (ref 22–32)
Calcium: 9.5 mg/dL (ref 8.9–10.3)
Chloride: 108 mmol/L (ref 98–111)
Creatinine: 1.37 mg/dL — ABNORMAL HIGH (ref 0.44–1.00)
GFR, Estimated: 44 mL/min — ABNORMAL LOW (ref >60)
Glucose, Bld: 96 mg/dL (ref 70–99)
Potassium: 3.6 mmol/L (ref 3.5–5.1)
Sodium: 140 mmol/L (ref 135–145)
Total Bilirubin: 0.5 mg/dL (ref 0.0–1.2)
Total Protein: 7.2 g/dL (ref 6.5–8.1)

## 2023-12-07 LAB — CBC WITH DIFFERENTIAL (CANCER CENTER ONLY)
Abs Immature Granulocytes: 0.01 10*3/uL (ref 0.00–0.07)
Basophils Absolute: 0 10*3/uL (ref 0.0–0.1)
Basophils Relative: 1 %
Eosinophils Absolute: 0.1 10*3/uL (ref 0.0–0.5)
Eosinophils Relative: 2 %
HCT: 33.4 % — ABNORMAL LOW (ref 36.0–46.0)
Hemoglobin: 10.9 g/dL — ABNORMAL LOW (ref 12.0–15.0)
Immature Granulocytes: 0 %
Lymphocytes Relative: 33 %
Lymphs Abs: 0.9 10*3/uL (ref 0.7–4.0)
MCH: 29.7 pg (ref 26.0–34.0)
MCHC: 32.6 g/dL (ref 30.0–36.0)
MCV: 91 fL (ref 80.0–100.0)
Monocytes Absolute: 0.2 10*3/uL (ref 0.1–1.0)
Monocytes Relative: 6 %
Neutro Abs: 1.6 10*3/uL — ABNORMAL LOW (ref 1.7–7.7)
Neutrophils Relative %: 58 %
Platelet Count: 145 10*3/uL — ABNORMAL LOW (ref 150–400)
RBC: 3.67 MIL/uL — ABNORMAL LOW (ref 3.87–5.11)
RDW: 13.5 % (ref 11.5–15.5)
WBC Count: 2.8 10*3/uL — ABNORMAL LOW (ref 4.0–10.5)
nRBC: 0 % (ref 0.0–0.2)

## 2023-12-08 ENCOUNTER — Other Ambulatory Visit: Payer: Self-pay | Admitting: Cardiology

## 2023-12-08 ENCOUNTER — Encounter: Payer: Self-pay | Admitting: Cardiology

## 2023-12-08 ENCOUNTER — Ambulatory Visit: Payer: 59 | Attending: Cardiology | Admitting: Cardiology

## 2023-12-08 VITALS — BP 122/78 | HR 78 | Resp 16 | Ht 74.0 in | Wt 277.4 lb

## 2023-12-08 DIAGNOSIS — I872 Venous insufficiency (chronic) (peripheral): Secondary | ICD-10-CM

## 2023-12-08 DIAGNOSIS — E7800 Pure hypercholesterolemia, unspecified: Secondary | ICD-10-CM

## 2023-12-08 DIAGNOSIS — I10 Essential (primary) hypertension: Secondary | ICD-10-CM

## 2023-12-08 LAB — LIPID PANEL
Chol/HDL Ratio: 2.2 {ratio} (ref 0.0–4.4)
Cholesterol, Total: 128 mg/dL (ref 100–199)
HDL: 57 mg/dL (ref >39)
LDL Chol Calc (NIH): 53 mg/dL (ref 0–99)
Triglycerides: 95 mg/dL (ref 0–149)
VLDL Cholesterol Cal: 18 mg/dL (ref 5–40)

## 2023-12-08 LAB — CANCER ANTIGEN 27.29: CA 27.29: 41.9 U/mL — ABNORMAL HIGH (ref 0.0–38.6)

## 2023-12-08 NOTE — Patient Instructions (Signed)
 Medication Instructions:  Your physician recommends that you continue on your current medications as directed. Please refer to the Current Medication list given to you today.  *If you need a refill on your cardiac medications before your next appointment, please call your pharmacy*   Lab Work: Have lipid panel checked at [facility] on the first floor today If you have labs (blood work) drawn today and your tests are completely normal, you will receive your results only by: MyChart Message (if you have MyChart) OR A paper copy in the mail If you have any lab test that is abnormal or we need to change your treatment, we will call you to review the results.   Testing/Procedures: none   Follow-Up: At [facility], you and your health needs are our priority.  As part of our continuing mission to provide you with exceptional heart care, we have created designated Provider Care Teams.  These Care Teams include your primary Cardiologist (physician) and Advanced Practice Providers (APPs -  Physician Assistants and Nurse Practitioners) who all work together to provide you with the care you need, when you need it.  We recommend signing up for the patient portal called "MyChart".  Sign up information is provided on this After Visit Summary.  MyChart is used to connect with patients for Virtual Visits (Telemedicine).  Patients are able to view lab/test results, encounter notes, upcoming appointments, etc.  Non-urgent messages can be sent to your provider as well.   To learn more about what you can do with MyChart, go to [email or web address].    Your next appointment:   As needed  Provider:   Yates Decamp, MD     Other Instructions

## 2023-12-08 NOTE — Progress Notes (Signed)
 Cardiology Office Note:  .   Date:  12/08/2023  ID:  [name], DOB [birth year], MRN [id number] PCP: Elie Confer, NP  [facility] Providers Cardiologist:  Yates Decamp, MD   History of Present Illness: .   [name] is a 63 y.o. African American female with a history of Left Breast Cancer SP lumpectomy followed by radiation therapy and chemotherapy for metastatic breast cancer diagnosis in January 2020, GERD,  and hypertension and left lower extremity venous insufficiency SP ablation presents for annual visit.  From cardiac standpoint she remains asymptomatic.  Discussed the use of AI scribe software for clinical note transcription with the patient, who gave verbal consent to proceed.  History of Present Illness   The patient, with a history of cancer, presents for a follow-up visit after recent chemotherapy. She reports significant improvement with the recent CT scan showing all nodes had shrunk significantly. She was initially shocked at the diagnosis of stage four cancer, but has been compliant with the treatment plan. She is currently on chemotherapy twice a day, which she finds challenging.  She also has a history of high cholesterol and is on atorvastatin for heart protection against the chemo. She reports occasional leg swelling and has been seen by a vascular specialist. 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 She wears compression stockings regularly.  She also has a history of radiation treatment, with about fifty rounds completed. She is on carvedilol and lisinopril for blood pressure control and heart protection. She has minimal leg swelling occasionally and continues to wear support stockings.      Labs   Lab Results  Component Value Date   CHOL 141 06/26/2020   HDL 42 06/26/2020   LDLCALC 81 06/26/2020   TRIG 91 06/26/2020   CHOLHDL 3.4 06/26/2020   Lab Results  Component Value Date   NA 140  12/07/2023   K 3.6 12/07/2023   CO2 29 12/07/2023   GLUCOSE 96 12/07/2023   BUN 16 12/07/2023   CREATININE 1.37 (H) 12/07/2023   CALCIUM 9.5 12/07/2023   GFRNONAA 44 (L) 12/07/2023      Latest Ref Rng & Units 12/07/2023   10:22 AM 11/09/2023   11:29 AM 10/05/2023    1:54 PM  BMP  Glucose 70 - 99 mg/dL 96  91  94   BUN 8 - 23 mg/dL 16  20  18    Creatinine 0.44 - 1.00 mg/dL 9.56  2.13  0.86   Sodium 135 - 145 mmol/L 140  138  140   Potassium 3.5 - 5.1 mmol/L 3.6  3.8  3.8   Chloride 98 - 111 mmol/L 108  104  106   CO2 22 - 32 mmol/L 29  30  30    Calcium 8.9 - 10.3 mg/dL 9.5  9.9  9.3       Latest Ref Rng & Units 12/07/2023   10:22 AM 11/09/2023   11:29 AM 10/05/2023    1:54 PM  CBC  WBC 4.0 - 10.5 K/uL 2.8  3.3  4.3   Hemoglobin 12.0 - 15.0 g/dL 57.8  46.9  62.9   Hematocrit 36.0 - 46.0 % 33.4  35.0  32.7   Platelets 150 - 400 K/uL 145  161  215    Lab Results  Component Value Date   HGBA1C 5.7 (H) 05/13/2017    Lab Results  Component Value Date   TSH 1.566  06/26/2020    External Labs:    Review of Systems  Cardiovascular:  Positive for leg swelling. Negative for chest pain and dyspnea on exertion. Irregular heartbeat: occasional.  Physical Exam:   VS:  BP 122/78 (BP Location: Left Arm, Patient Position: Sitting, Cuff Size: Large)   Pulse 78   Resp 16   Ht 6\' 2"  (1.88 m)   Wt 277 lb 6.4 oz (125.8 kg)   SpO2 97%   BMI 35.62 kg/m    Wt Readings from Last 3 Encounters:  12/08/23 277 lb 6.4 oz (125.8 kg)  11/09/23 278 lb 9.6 oz (126.4 kg)  11/05/23 278 lb (126.1 kg)     Physical Exam Constitutional:      Appearance: She is obese.  Neck:     Vascular: No carotid bruit or JVD.  Cardiovascular:     Rate and Rhythm: Normal rate and regular rhythm.     Pulses: Intact distal pulses.     Heart sounds: Normal heart sounds. No murmur heard.    No gallop.  Pulmonary:     Effort: Pulmonary effort is normal.     Breath sounds: Normal breath sounds.  Abdominal:      General: Bowel sounds are normal.     Palpations: Abdomen is soft.  Musculoskeletal:     Right lower leg: Edema (trace ankle) present.     Left lower leg: Edema (trace ankle) present.    Studies Reviewed: Marland Kitchen    Chest and abdomen pelvis 10/27/2023: Cardiovascular: No significant vascular findings. Normal heart size. Scattered left coronary artery calcifications. No pericardial effusion. EKG:         EKG 07/30/2022: Sinus rhythm rate 90 bpm.  Normal axis.  No evidence of ischemia.  Normal EKG.   Medications and allergies    No Known Allergies   Current Outpatient Medications:    abemaciclib (VERZENIO) 150 MG tablet, Take 1 tablet (150 mg total) by mouth 2 (two) times daily., Disp: 60 tablet, Rfl: 1   atorvastatin (LIPITOR) 10 MG tablet, Take 1 tablet by mouth daily., Disp: , Rfl:    carvedilol (COREG) 6.25 MG tablet, TAKE 1 TABLET BY MOUTH TWICE DAILY (Patient taking differently: Take 6.25 mg by mouth 2 (two) times daily with a meal.), Disp: 180 tablet, Rfl: 1   diclofenac Sodium (VOLTAREN) 1 % GEL, Apply 4 g topically 4 (four) times daily., Disp: 100 g, Rfl: 4   letrozole (FEMARA) 2.5 MG tablet, Take 1 tablet (2.5 mg total) by mouth daily., Disp: 90 tablet, Rfl: 3   lisinopril-hydrochlorothiazide (ZESTORETIC) 10-12.5 MG tablet, Take 1 tablet by mouth daily., Disp: , Rfl:    ondansetron (ZOFRAN) 4 MG tablet, Take 1 tablet (4 mg total) by mouth every 8 (eight) hours as needed for nausea or vomiting., Disp: 20 tablet, Rfl: 0   pantoprazole (PROTONIX) 40 MG tablet, TAKE 1 TABLET BY MOUTH EVERY DAY, Disp: 90 tablet, Rfl: 1   Vitamin D, Ergocalciferol, (DRISDOL) 1.25 MG (50000 UNIT) CAPS capsule, TAKE 1 CAPSULE (50,000 UNITS TOTAL) BY MOUTH EVERY 7 (SEVEN) DAYS, Disp: 12 capsule, Rfl: 1 No current facility-administered medications for this visit.  Facility-Administered Medications Ordered in Other Visits:    gadopentetate dimeglumine (MAGNEVIST) injection 20 mL, 20 mL, Intravenous,  Once PRN, Naomie Dean B, MD   gadopentetate dimeglumine (MAGNEVIST) injection 20 mL, 20 mL, Intravenous, Once PRN, Anson Fret, MD   ASSESSMENT AND PLAN: .      ICD-10-CM   1. Essential hypertension  I10  2. Venous (peripheral) insufficiency  I87.2     3. Hypercholesteremia  E78.00      Assessment and Plan    Very mild coronary calcification noted on CT scan of the chest and history of prior radiation therapy to the chest. CT scan shows minimal plaque in the coronary arteries, likely due to past radiation therapy. The condition is minimal. Informed about the minimal nature of the plaque and the importance of ongoing monitoring. Monitor coronary artery plaque.  Presently on Lipitor and LDL is <100.  Recheck lipids today.  Hypertension Blood pressure is well controlled with carvedilol and lisinopril, which also provide cardioprotection against chemotherapy-induced issues. Understands the importance of these medications in preventing cardiac complications from chemotherapy. Continue carvedilol and lisinopril.  Hyperlipidemia On atorvastatin for cholesterol management. Recent blood work did not include a cholesterol panel, and it has not been checked in a couple of years. Previous levels were well controlled. Understands the need for regular monitoring of cholesterol levels. Order lipid panel and continue atorvastatin.  Stage IV breast Cancer Initially diagnosed in 2020, recent CT scans show significant reduction in node size. Currently on chemotherapy with an increased dosage of 150 mg twice a day. Experiences neuropathy in the feet and occasional leg swelling, managed with compression stockings. Informed consent discussed regarding increased chemotherapy dosage, including potential benefits of further tumor reduction and risks such as increased neuropathy and leg swelling. Prefers to continue aggressive treatment to contain the cancer in the lung. Continue chemotherapy regimen at  150 mg twice a day, continue wearing compression stockings, and monitor for neuropathy and leg swelling.  General Health Maintenance Advised to work on [employer] management for overall health improvement. Encourage weight management.  Follow-up Follow-up as needed and contact cardiologist if any issues arise.           Signed,  Yates Decamp, MD, [facility] 12/08/2023, 8:32 AM [facility] [address] [place], [place] [address] Phone: [phone]. Fax:  [phone]

## 2023-12-09 ENCOUNTER — Telehealth: Payer: Self-pay | Admitting: Hematology and Oncology

## 2023-12-09 NOTE — Telephone Encounter (Signed)
 Scheduled appointment per 2/25 secure chat. Patient is aware of the made appointment.

## 2023-12-10 ENCOUNTER — Telehealth: Payer: Self-pay | Admitting: Internal Medicine

## 2023-12-10 NOTE — Telephone Encounter (Signed)
 [facility] Clinical [facility] Coordinator note:   This visit for Subject [name] with DOB: [birth year] on 12/10/2023. Subject contacted regarding [id number] to inform that Principal Investigator has changed to Dr. Delton Coombes. Patient expressed understanding.

## 2023-12-11 ENCOUNTER — Telehealth: Payer: Self-pay | Admitting: Pharmacy Technician

## 2023-12-11 NOTE — Telephone Encounter (Signed)
 Oral Oncology Patient Advocate Encounter  Per the representative at [facility], patient will need to call [employer] to get her virtual debit card to allow for processing of medication. I provided the patient with this information and the  phone number for [employer] copay card program, [phone].  I advised patient to reach out to [facility] after she has the e-debit card for payment so that she can schedule her shipment.  I also provided her with my phone number to reach out with any issues.  Omer Jack, CPhT-Adv Oncology Pharmacy Patient Advocate [facility]  Direct Number: [phone]  Fax: [phone]

## 2023-12-11 NOTE — Telephone Encounter (Signed)
 Oral Oncology Patient Advocate Encounter   Was successful in obtaining a copay card for Verzenio.  This copay card will make the patients copay $0.  I have spoken with the patient.    The billing information is as follows and has been shared with [facility].   RxBin: [id number] PCN: [id number] Member ID: [id number] Group ID: [id number]   Omer Jack, CPhT-Adv Oncology Pharmacy Patient Advocate [facility]  Direct Number: [phone]  Fax: [phone]

## 2023-12-13 ENCOUNTER — Encounter: Payer: Self-pay | Admitting: Cardiology

## 2023-12-13 NOTE — Progress Notes (Signed)
 Lipids under excellent control, continue present medications.

## 2023-12-14 ENCOUNTER — Other Ambulatory Visit: Payer: Self-pay | Admitting: Hematology and Oncology

## 2023-12-14 ENCOUNTER — Inpatient Hospital Stay: Payer: 59 | Attending: Hematology and Oncology | Admitting: Pharmacist

## 2023-12-14 VITALS — BP 127/69 | HR 18 | Resp 98 | Ht 74.0 in | Wt 279.0 lb

## 2023-12-14 DIAGNOSIS — Z1732 Human epidermal growth factor receptor 2 negative status: Secondary | ICD-10-CM | POA: Diagnosis not present

## 2023-12-14 DIAGNOSIS — C50412 Malignant neoplasm of upper-outer quadrant of left female breast: Secondary | ICD-10-CM | POA: Insufficient documentation

## 2023-12-14 DIAGNOSIS — Z170 Estrogen receptor positive status [ER+]: Secondary | ICD-10-CM | POA: Insufficient documentation

## 2023-12-14 DIAGNOSIS — Z1721 Progesterone receptor positive status: Secondary | ICD-10-CM | POA: Diagnosis not present
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
## 2023-12-16 ENCOUNTER — Encounter: Payer: Self-pay | Admitting: Hematology and Oncology

## 2023-12-21 ENCOUNTER — Telehealth: Payer: Self-pay | Admitting: *Deleted

## 2023-12-21 NOTE — Telephone Encounter (Signed)
 [employer], Lujean Rave , Education officer, [employer] e-mailed [id number] paperwork with note reading the following. " I am reaching out on behalf of [name] to ensure that the spelling of her last name is correctly reflected on her FMLA documentation. Upon reviewing the documents, we noticed that the previous FMLA paperwork listed her last name as "Smith-Lambert." However, the updated documentation, which was submitted today, correctly reflects her full name as [name]. For your reference, all official records with [employer] list her name as [name]."  This nurse entered full middle name to EMR which agrees with employment records.

## 2023-12-21 NOTE — Telephone Encounter (Signed)
 Emailed (SW) H.I.M tip sheet, [facility] office Cover Sheet and a [place] HIPAA Authorization to  To [email or web address] and [email or web address]. Received delivery complate message.

## 2023-12-23 ENCOUNTER — Other Ambulatory Visit: Payer: Self-pay | Admitting: Hematology and Oncology

## 2023-12-23 NOTE — Telephone Encounter (Addendum)
 Form completed to provider for review and signature.  Returned today and faxed to [facility].  Spoke with Renita Papa earlier today.  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  There is an employee named [employer].  No further changes to form beyond correcting appointments.  A new appointment in April 2025canceling previously scheduled for July 2025 appt.

## 2023-12-24 ENCOUNTER — Encounter: Payer: Self-pay | Admitting: Hematology and Oncology

## 2023-12-24 ENCOUNTER — Other Ambulatory Visit (HOSPITAL_COMMUNITY): Payer: Self-pay

## 2023-12-24 NOTE — Progress Notes (Signed)
 Notified via staff message patient requesting financial assistance. Called patient to screen for [employer]. Patient does not qualify to apply for [employer] grant and I do not have any other programs she could qualify for. Asked about Medicaid and Food stamps and patient does not qualify. She verbalized understanding.

## 2024-01-05 ENCOUNTER — Ambulatory Visit (INDEPENDENT_AMBULATORY_CARE_PROVIDER_SITE_OTHER): Payer: 59 | Admitting: Podiatry

## 2024-01-05 ENCOUNTER — Encounter: Payer: Self-pay | Admitting: Podiatry

## 2024-01-05 DIAGNOSIS — L600 Ingrowing nail: Secondary | ICD-10-CM

## 2024-01-05 NOTE — Progress Notes (Signed)
  Subjective:  Patient ID: [name], female    DOB: 10-22-1960,  MRN: [id number]  Chief Complaint  Patient presents with   Ingrown Toenail    Return in about 2 months (around 01/03/2024) for follow up on arthritis, toenail removal . Pt denies pain related to ingrown only neuropathy.    63 y.o. female returns for post-op check.  Doing well her orthotics are comfortable not having pain  Review of Systems: Negative except as noted in the HPI. Denies N/V/F/Ch.   Objective:  There were no vitals filed for this visit. There is no height or weight on file to calculate BMI. Constitutional Well developed. Well nourished.  Vascular Foot warm and well perfused. Capillary refill normal to all digits.  Calf is soft and supple, no posterior calf or knee pain, negative Homans' sign  Neurologic Normal speech. Oriented to person, place, and time. Epicritic sensation to light touch grossly present bilaterally.  Dermatologic Well-healed surgical matricectomy's  Orthopedic: No pain in the toenails no pain in the midfoot   Assessment:   1. Ingrowing right great toenail   2. Ingrowing left great toenail     Plan:  Patient was evaluated and treated and all questions answered.  S/p foot surgery bilaterally -Doing much better she is pain-free.  Her orthotics fit well and are comfortable and she feels like her arthritis and issues are now manageable.  She will follow-up with me as needed if she has further problems.   No follow-ups on file.

## 2024-01-10 ENCOUNTER — Encounter (HOSPITAL_COMMUNITY): Payer: Self-pay | Admitting: Family Medicine

## 2024-01-10 ENCOUNTER — Observation Stay (HOSPITAL_COMMUNITY)
Admission: EM | Admit: 2024-01-10 | Discharge: 2024-01-12 | Disposition: A | Discharge status: Home / Self Care | Attending: Internal Medicine | Admitting: Internal Medicine

## 2024-01-10 ENCOUNTER — Other Ambulatory Visit: Payer: Self-pay

## 2024-01-10 ENCOUNTER — Emergency Department (HOSPITAL_COMMUNITY)

## 2024-01-10 DIAGNOSIS — K922 Gastrointestinal hemorrhage, unspecified: Principal | ICD-10-CM | POA: Diagnosis present

## 2024-01-10 DIAGNOSIS — E66812 Obesity, class 2: Secondary | ICD-10-CM | POA: Diagnosis not present

## 2024-01-10 DIAGNOSIS — Z79899 Other long term (current) drug therapy: Secondary | ICD-10-CM | POA: Diagnosis not present

## 2024-01-10 DIAGNOSIS — I10 Essential (primary) hypertension: Secondary | ICD-10-CM | POA: Diagnosis present

## 2024-01-10 DIAGNOSIS — E785 Hyperlipidemia, unspecified: Secondary | ICD-10-CM | POA: Diagnosis present

## 2024-01-10 DIAGNOSIS — R5383 Other fatigue: Secondary | ICD-10-CM

## 2024-01-10 DIAGNOSIS — Z1152 Encounter for screening for COVID-19: Secondary | ICD-10-CM | POA: Insufficient documentation

## 2024-01-10 DIAGNOSIS — Z170 Estrogen receptor positive status [ER+]: Secondary | ICD-10-CM | POA: Insufficient documentation

## 2024-01-10 DIAGNOSIS — R6889 Other general symptoms and signs: Secondary | ICD-10-CM

## 2024-01-10 DIAGNOSIS — D649 Anemia, unspecified: Principal | ICD-10-CM | POA: Insufficient documentation

## 2024-01-10 DIAGNOSIS — K625 Hemorrhage of anus and rectum: Secondary | ICD-10-CM

## 2024-01-10 DIAGNOSIS — C50412 Malignant neoplasm of upper-outer quadrant of left female breast: Secondary | ICD-10-CM

## 2024-01-10 DIAGNOSIS — K921 Melena: Secondary | ICD-10-CM

## 2024-01-10 DIAGNOSIS — R509 Fever, unspecified: Secondary | ICD-10-CM | POA: Diagnosis not present

## 2024-01-10 DIAGNOSIS — D638 Anemia in other chronic diseases classified elsewhere: Secondary | ICD-10-CM

## 2024-01-10 DIAGNOSIS — K648 Other hemorrhoids: Secondary | ICD-10-CM | POA: Diagnosis not present

## 2024-01-10 LAB — RESP PANEL BY RT-PCR (RSV, FLU A&B, COVID)  RVPGX2
Influenza A by PCR: NEGATIVE
Influenza B by PCR: NEGATIVE
Resp Syncytial Virus by PCR: NEGATIVE
SARS Coronavirus 2 by RT PCR: NEGATIVE

## 2024-01-10 LAB — CBC WITH DIFFERENTIAL/PLATELET
Abs Immature Granulocytes: 0.02 10*3/uL (ref 0.00–0.07)
Basophils Absolute: 0 10*3/uL (ref 0.0–0.1)
Basophils Relative: 1 %
Eosinophils Absolute: 0 10*3/uL (ref 0.0–0.5)
Eosinophils Relative: 0 %
HCT: 29.8 % — ABNORMAL LOW (ref 36.0–46.0)
Hemoglobin: 9.9 g/dL — ABNORMAL LOW (ref 12.0–15.0)
Immature Granulocytes: 1 %
Lymphocytes Relative: 27 %
Lymphs Abs: 1.1 10*3/uL (ref 0.7–4.0)
MCH: 31.3 pg (ref 26.0–34.0)
MCHC: 33.2 g/dL (ref 30.0–36.0)
MCV: 94.3 fL (ref 80.0–100.0)
Monocytes Absolute: 0.4 10*3/uL (ref 0.1–1.0)
Monocytes Relative: 9 %
Neutro Abs: 2.6 10*3/uL (ref 1.7–7.7)
Neutrophils Relative %: 62 %
Platelets: 103 10*3/uL — ABNORMAL LOW (ref 150–400)
RBC: 3.16 MIL/uL — ABNORMAL LOW (ref 3.87–5.11)
RDW: 12.8 % (ref 11.5–15.5)
WBC: 4.1 10*3/uL (ref 4.0–10.5)
nRBC: 0 % (ref 0.0–0.2)

## 2024-01-10 LAB — I-STAT CHEM 8, ED
BUN: 13 mg/dL (ref 8–23)
Calcium, Ion: 1.15 mmol/L (ref 1.15–1.40)
Chloride: 103 mmol/L (ref 98–111)
Creatinine, Ser: 1.2 mg/dL — ABNORMAL HIGH (ref 0.44–1.00)
Glucose, Bld: 85 mg/dL (ref 70–99)
HCT: 30 % — ABNORMAL LOW (ref 36.0–46.0)
Hemoglobin: 10.2 g/dL — ABNORMAL LOW (ref 12.0–15.0)
Potassium: 4.9 mmol/L (ref 3.5–5.1)
Sodium: 136 mmol/L (ref 135–145)
TCO2: 27 mmol/L (ref 22–32)

## 2024-01-10 LAB — COMPREHENSIVE METABOLIC PANEL WITH GFR
ALT: 18 U/L (ref 0–44)
AST: 20 U/L (ref 15–41)
Albumin: 3.5 g/dL (ref 3.5–5.0)
Alkaline Phosphatase: 53 U/L (ref 38–126)
Anion gap: 9 (ref 5–15)
BUN: 12 mg/dL (ref 8–23)
CO2: 25 mmol/L (ref 22–32)
Calcium: 9.2 mg/dL (ref 8.9–10.3)
Chloride: 103 mmol/L (ref 98–111)
Creatinine, Ser: 1.05 mg/dL — ABNORMAL HIGH (ref 0.44–1.00)
GFR, Estimated: 60 mL/min — ABNORMAL LOW (ref >60)
Glucose, Bld: 91 mg/dL (ref 70–99)
Potassium: 3.6 mmol/L (ref 3.5–5.1)
Sodium: 137 mmol/L (ref 135–145)
Total Bilirubin: 1 mg/dL (ref 0.0–1.2)
Total Protein: 7.2 g/dL (ref 6.5–8.1)

## 2024-01-10 LAB — URINALYSIS, ROUTINE W REFLEX MICROSCOPIC
Bilirubin Urine: NEGATIVE
Glucose, UA: NEGATIVE mg/dL
Ketones, ur: 5 mg/dL — AB
Nitrite: NEGATIVE
Protein, ur: NEGATIVE mg/dL
Specific Gravity, Urine: 1.008 (ref 1.005–1.030)
pH: 6 (ref 5.0–8.0)

## 2024-01-10 LAB — TYPE AND SCREEN
ABO/RH(D): B POS
Antibody Screen: NEGATIVE

## 2024-01-10 LAB — TSH: TSH: 2.017 u[IU]/mL (ref 0.350–4.500)

## 2024-01-10 LAB — LACTIC ACID, PLASMA: Lactic Acid, Venous: 1.3 mmol/L (ref 0.5–1.9)

## 2024-01-10 MED ORDER — SODIUM CHLORIDE 0.9 % IV SOLN
2.0000 g | INTRAVENOUS | Status: DC
  Administered 2024-01-10 – 2024-01-11 (×2): 2 g via INTRAVENOUS
  Filled 2024-01-10 (×2): qty 20

## 2024-01-10 MED ORDER — CARVEDILOL 3.125 MG PO TABS
6.2500 mg | ORAL_TABLET | Freq: Two times a day (BID) | ORAL | Status: DC
  Administered 2024-01-10 – 2024-01-12 (×4): 6.25 mg via ORAL
  Filled 2024-01-10 (×5): qty 2

## 2024-01-10 MED ORDER — METOPROLOL TARTRATE 5 MG/5ML IV SOLN: 5.0000 mg | Freq: Four times a day (QID) | INTRAVENOUS | Status: DC | PRN

## 2024-01-10 MED ORDER — ENOXAPARIN SODIUM 40 MG/0.4ML IJ SOSY: 40.0000 mg | PREFILLED_SYRINGE | INTRAMUSCULAR | Status: DC

## 2024-01-10 MED ORDER — IOHEXOL 350 MG/ML SOLN
100.0000 mL | Freq: Once | INTRAVENOUS | Status: AC | PRN
  Administered 2024-01-10: 100 mL via INTRAVENOUS

## 2024-01-10 MED ORDER — VITAMIN D (ERGOCALCIFEROL) 1.25 MG (50000 UNIT) PO CAPS
50000.0000 [IU] | ORAL_CAPSULE | ORAL | Status: DC
  Administered 2024-01-10: 50000 [IU] via ORAL
  Filled 2024-01-10: qty 1

## 2024-01-10 MED ORDER — ONDANSETRON HCL 4 MG PO TABS
4.0000 mg | ORAL_TABLET | Freq: Four times a day (QID) | ORAL | Status: DC | PRN
  Administered 2024-01-10: 4 mg via ORAL
  Filled 2024-01-10: qty 1

## 2024-01-10 MED ORDER — ORAL CARE MOUTH RINSE: 15.0000 mL | OROMUCOSAL | Status: DC | PRN

## 2024-01-10 MED ORDER — POLYETHYLENE GLYCOL 3350 17 G PO PACK: 17.0000 g | PACK | Freq: Every day | ORAL | Status: DC | PRN

## 2024-01-10 MED ORDER — LOPERAMIDE HCL 2 MG PO CAPS: 4.0000 mg | ORAL_CAPSULE | ORAL | Status: DC | PRN

## 2024-01-10 MED ORDER — HYDROCHLOROTHIAZIDE 12.5 MG PO TABS
12.5000 mg | ORAL_TABLET | Freq: Every day | ORAL | Status: DC
  Administered 2024-01-10 – 2024-01-11 (×2): 12.5 mg via ORAL
  Filled 2024-01-10 (×2): qty 1

## 2024-01-10 MED ORDER — ACETAMINOPHEN 650 MG RE SUPP: 650.0000 mg | Freq: Four times a day (QID) | RECTAL | Status: DC | PRN

## 2024-01-10 MED ORDER — FENTANYL CITRATE PF 50 MCG/ML IJ SOSY: 12.5000 ug | PREFILLED_SYRINGE | INTRAMUSCULAR | Status: DC | PRN

## 2024-01-10 MED ORDER — ATORVASTATIN CALCIUM 10 MG PO TABS
10.0000 mg | ORAL_TABLET | Freq: Every day | ORAL | Status: DC
  Administered 2024-01-10 – 2024-01-12 (×3): 10 mg via ORAL
  Filled 2024-01-10 (×3): qty 1

## 2024-01-10 MED ORDER — DICLOFENAC SODIUM 1 % EX GEL
4.0000 g | Freq: Four times a day (QID) | CUTANEOUS | Status: DC
  Administered 2024-01-10 – 2024-01-12 (×6): 4 g via TOPICAL
  Filled 2024-01-10: qty 100

## 2024-01-10 MED ORDER — ACETAMINOPHEN 325 MG PO TABS: 650.0000 mg | ORAL_TABLET | Freq: Four times a day (QID) | ORAL | Status: DC | PRN

## 2024-01-10 MED ORDER — ABEMACICLIB 150 MG PO TABS: 150.0000 mg | ORAL_TABLET | Freq: Two times a day (BID) | ORAL | Status: DC

## 2024-01-10 MED ORDER — LISINOPRIL 10 MG PO TABS
10.0000 mg | ORAL_TABLET | Freq: Every day | ORAL | Status: DC
  Administered 2024-01-10 – 2024-01-11 (×2): 10 mg via ORAL
  Filled 2024-01-10 (×2): qty 1

## 2024-01-10 MED ORDER — LETROZOLE 2.5 MG PO TABS
2.5000 mg | ORAL_TABLET | Freq: Every day | ORAL | Status: DC
Start: 2024-01-10 — End: 2024-01-12
  Administered 2024-01-10 – 2024-01-12 (×3): 2.5 mg via ORAL
  Filled 2024-01-10 (×3): qty 1

## 2024-01-10 MED ORDER — PANTOPRAZOLE SODIUM 40 MG PO TBEC
40.0000 mg | DELAYED_RELEASE_TABLET | Freq: Every day | ORAL | Status: DC
  Administered 2024-01-10 – 2024-01-12 (×3): 40 mg via ORAL
  Filled 2024-01-10 (×3): qty 1

## 2024-01-10 MED ORDER — LISINOPRIL-HYDROCHLOROTHIAZIDE 10-12.5 MG PO TABS: 1.0000 | ORAL_TABLET | Freq: Every day | ORAL | Status: DC

## 2024-01-10 MED ORDER — TRAZODONE HCL 50 MG PO TABS: 25.0000 mg | ORAL_TABLET | Freq: Every evening | ORAL | Status: DC | PRN

## 2024-01-10 MED ORDER — HYDROCODONE-ACETAMINOPHEN 5-325 MG PO TABS: 1.0000 | ORAL_TABLET | ORAL | Status: DC | PRN

## 2024-01-10 MED ORDER — THIAMINE MONONITRATE 100 MG PO TABS
100.0000 mg | ORAL_TABLET | Freq: Every day | ORAL | Status: DC
  Administered 2024-01-10 – 2024-01-12 (×3): 100 mg via ORAL
  Filled 2024-01-10 (×3): qty 1

## 2024-01-10 NOTE — H&P (Signed)
 History and Physical    Patient: [name] MWN:[id number] DOB: 1961-02-21 DOA: 01/10/2024 DOS: the patient was seen and examined on 01/10/2024 PCP: Elie Confer, NP  Patient coming from: Home  Chief Complaint:  Chief Complaint  Patient presents with   Hematochezia   HPI: [name] is a 63 y.o. female with medical history significant of GERD, HTN, stage IV breast cancer on Verzenio who has been on a stable dose of this since 11/14/2023.  She recently had runny nose, denied any cough but over the last week has felt increasing weakness, and having no energy.  This is new for her.  She reports feeling lightheaded, dizzy, having headache for the last 3 days.  Over the last few nights she has had rigors and cold sweats but denies fever.  Patient also reports bright red bleeding noted on brown stool which is painless.  She has previously had a colonoscopy with [facility] approximately 2 years ago which showed several tubular adenomas and internal hemorrhoids but was otherwise normal.  Review of Systems: As mentioned in the history of present illness. All other systems reviewed and are negative. Past Medical History:  Diagnosis Date   Complication of anesthesia    PONV   Fatigue    GERD (gastroesophageal reflux disease)    w nonobstructing esophageal stricture   Headache    Heat intolerance    History of ETT 03/2009   myoview EF 65%, normal wall motion, no ischemia or infarction, poor exercise capacity   HTN (hypertension)    left breast ca dx'd 10/2018   breast- left   Motion sickness    Normal echocardiogram 03/2009   EF 60-65% moderate diastolic dysfunction, mild LAE   Numbness and tingling    Obesity    PONV (postoperative nausea and vomiting)    S/P partial hysterectomy    Past Surgical History:  Procedure Laterality Date   ABDOMINAL HYSTERECTOMY  2007   partial    AXILLARY LYMPH NODE DISSECTION Left 03/11/2019   Procedure: LEFT AXILLARY LYMPH  NODE DISSECTION;  Surgeon: Harriette Bouillon, MD;  Location: [facility];  Service: General;  Laterality: Left;   BREAST LUMPECTOMY WITH RADIOACTIVE SEED AND SENTINEL LYMPH NODE BIOPSY Left 02/22/2019   Procedure: LEFT BREAST LUMPECTOMY x2 WITH RADIOACTIVE SEED AND LEFT AXILLA SEED  GUIDED LYMPH NODE BIOPSY AND LEFT SENTINEL LYMPH NODE MAPPING;  Surgeon: Harriette Bouillon, MD;  Location: [facility];  Service: General;  Laterality: Left;   BRONCHIAL BIOPSY  08/10/2023   Procedure: BRONCHIAL BIOPSIES;  Surgeon: Josephine Igo, DO;  Location: [facility];  Service: Pulmonary;;   CARPAL TUNNEL RELEASE Bilateral 2018   FOOT SURGERY Bilateral 2016   hysterectomy (other)     PORT-A-CATH REMOVAL N/A 02/22/2019   Procedure: REMOVAL PORT-A-CATH;  Surgeon: Harriette Bouillon, MD;  Location: [facility];  Service: General;  Laterality: N/A;   PORTACATH PLACEMENT N/A 11/10/2018   Procedure: INSERTION PORT-A-CATH WITH ULTRASOUND;  Surgeon: Harriette Bouillon, MD;  Location: [facility];  Service: General;  Laterality: N/A;   TONSILLECTOMY  1968   UPPER GASTROINTESTINAL ENDOSCOPY     Social History:  reports that she has never smoked. She has never been exposed to tobacco smoke. She has never used smokeless tobacco. She reports that she does not drink alcohol and does not use drugs.  No Known Allergies  Family History  Problem Relation Age of Onset   Heart attack Mother 21  Heart disease Mother    Diabetes Sister    Heart attack Maternal Grandmother        age 23 or 28   Heart disease Maternal Grandmother    Neuropathy Neg Hx    Colon cancer Neg Hx    Colon polyps Neg Hx    Esophageal cancer Neg Hx    Stomach cancer Neg Hx    Rectal cancer Neg Hx     Prior to Admission medications   Medication Sig Start Date End Date Taking? Authorizing Provider  atorvastatin (LIPITOR) 10 MG tablet Take 1 tablet by mouth daily.    [provider]  carvedilol (COREG) 6.25  MG tablet TAKE 1 TABLET BY MOUTH TWICE DAILY Patient taking differently: Take 6.25 mg by mouth 2 (two) times daily with a meal. 03/03/15   Brock Bad, MD  diclofenac Sodium (VOLTAREN) 1 % GEL Apply 4 g topically 4 (four) times daily. 09/17/23   Edwin Cap, DPM  letrozole (FEMARA) 2.5 MG tablet Take 1 tablet (2.5 mg total) by mouth daily. 08/14/23   Serena Croissant, MD  lisinopril-hydrochlorothiazide (ZESTORETIC) 10-12.5 MG tablet Take 1 tablet by mouth daily.    [provider]  ondansetron (ZOFRAN) 4 MG tablet TAKE 1 TABLET BY MOUTH EVERY 8 HOURS AS NEEDED FOR NAUSEA AND VOMITING 12/14/23   Serena Croissant, MD  pantoprazole (PROTONIX) 40 MG tablet TAKE 1 TABLET BY MOUTH EVERY DAY 09/01/23   Serena Croissant, MD  VERZENIO 150 MG tablet TAKE 1 TABLET BY MOUTH 2 TIMES A DAY 12/23/23   Serena Croissant, MD  Vitamin D, Ergocalciferol, (DRISDOL) 1.25 MG (50000 UNIT) CAPS capsule TAKE 1 CAPSULE (50,000 UNITS TOTAL) BY MOUTH EVERY 7 (SEVEN) DAYS 09/28/23   Edwin Cap, DPM    Physical Exam: Vitals:   01/10/24 1258 01/10/24 1635 01/10/24 1645  BP: (!) 148/98 (!) 148/98   Pulse: (!) 105 91   Resp: 18 17   Temp: 99.4 F (37.4 C)  98.3 F (36.8 C)  TempSrc: Oral  Oral  SpO2: 96% 99%   Weight: 127 kg    Height: 6\' 2"  (1.88 m)     Physical Examination: General appearance - alert, well appearing, and in no distress Chest - clear to auscultation, no wheezes, rales or rhonchi, symmetric air entry Heart - normal rate, regular rhythm, normal S1, S2, no murmurs, rubs, clicks or gallops Abdomen - soft, nontender, nondistended, no masses or organomegaly Extremities - peripheral pulses normal, no pedal edema, no clubbing or cyanosis  Data Reviewed: Results for orders placed or performed during the hospital encounter of 01/10/24 (from the past 24 hours)  Comprehensive metabolic panel with GFR     Status: Abnormal   Collection Time: 01/10/24  2:50 PM  Result Value Ref Range   Sodium 137 135 -  145 mmol/L   Potassium 3.6 3.5 - 5.1 mmol/L   Chloride 103 98 - 111 mmol/L   CO2 25 22 - 32 mmol/L   Glucose, Bld 91 70 - 99 mg/dL   BUN 12 8 - 23 mg/dL   Creatinine, Ser 9.60 (H) 0.44 - 1.00 mg/dL   Calcium 9.2 8.9 - 45.4 mg/dL   Total Protein 7.2 6.5 - 8.1 g/dL   Albumin 3.5 3.5 - 5.0 g/dL   AST 20 15 - 41 U/L   ALT 18 0 - 44 U/L   Alkaline Phosphatase 53 38 - 126 U/L   Total Bilirubin 1.0 0.0 - 1.2 mg/dL   GFR, Estimated  60 (L) >60 mL/min   Anion gap 9 5 - 15  Type and screen     Status: None   Collection Time: 01/10/24  2:50 PM  Result Value Ref Range   ABO/RH(D) B POS    Antibody Screen NEG    Sample Expiration      01/13/2024,2359 Performed at [facility], [address] [place], [place] [address]   I-stat chem 8, ed     Status: Abnormal   Collection Time: 01/10/24  3:32 PM  Result Value Ref Range   Sodium 136 135 - 145 mmol/L   Potassium 4.9 3.5 - 5.1 mmol/L   Chloride 103 98 - 111 mmol/L   BUN 13 8 - 23 mg/dL   Creatinine, Ser 1.91 (H) 0.44 - 1.00 mg/dL   Glucose, Bld 85 70 - 99 mg/dL   Calcium, Ion 4.78 2.95 - 1.40 mmol/L   TCO2 27 22 - 32 mmol/L   Hemoglobin 10.2 (L) 12.0 - 15.0 g/dL   HCT 62.1 (L) 30.8 - 65.7 %  CBC with Differential/Platelet     Status: Abnormal   Collection Time: 01/10/24  4:33 PM  Result Value Ref Range   WBC 4.1 4.0 - 10.5 K/uL   RBC 3.16 (L) 3.87 - 5.11 MIL/uL   Hemoglobin 9.9 (L) 12.0 - 15.0 g/dL   HCT 84.6 (L) 96.2 - 95.2 %   MCV 94.3 80.0 - 100.0 fL   MCH 31.3 26.0 - 34.0 pg   MCHC 33.2 30.0 - 36.0 g/dL   RDW 84.1 32.4 - 40.1 %   Platelets 103 (L) 150 - 400 K/uL   nRBC 0.0 0.0 - 0.2 %   Neutrophils Relative % 62 %   Neutro Abs 2.6 1.7 - 7.7 K/uL   Lymphocytes Relative 27 %   Lymphs Abs 1.1 0.7 - 4.0 K/uL   Monocytes Relative 9 %   Monocytes Absolute 0.4 0.1 - 1.0 K/uL   Eosinophils Relative 0 %   Eosinophils Absolute 0.0 0.0 - 0.5 K/uL   Basophils Relative 1 %   Basophils Absolute 0.0 0.0 - 0.1 K/uL    Immature Granulocytes 1 %   Abs Immature Granulocytes 0.02 0.00 - 0.07 K/uL  Resp panel by RT-PCR (RSV, Flu A&B, Covid) Anterior Nasal Swab     Status: None   Collection Time: 01/10/24  5:35 PM   Specimen: Anterior Nasal Swab  Result Value Ref Range   SARS Coronavirus 2 by RT PCR NEGATIVE NEGATIVE   Influenza A by PCR NEGATIVE NEGATIVE   Influenza B by PCR NEGATIVE NEGATIVE   Resp Syncytial Virus by PCR NEGATIVE NEGATIVE  Urinalysis, Routine w reflex microscopic -Urine, Clean Catch     Status: Abnormal   Collection Time: 01/10/24  6:01 PM  Result Value Ref Range   Color, Urine YELLOW YELLOW   APPearance CLEAR CLEAR   Specific Gravity, Urine 1.008 1.005 - 1.030   pH 6.0 5.0 - 8.0   Glucose, UA NEGATIVE NEGATIVE mg/dL   Hgb urine dipstick MODERATE (A) NEGATIVE   Bilirubin Urine NEGATIVE NEGATIVE   Ketones, ur 5 (A) NEGATIVE mg/dL   Protein, ur NEGATIVE NEGATIVE mg/dL   Nitrite NEGATIVE NEGATIVE   Leukocytes,Ua SMALL (A) NEGATIVE   RBC / HPF 0-5 0 - 5 RBC/hpf   WBC, UA 0-5 0 - 5 WBC/hpf   Bacteria, UA MANY (A) NONE SEEN   Squamous Epithelial / HPF 0-5 0 - 5 /HPF  Lactic acid, plasma  Status: None   Collection Time: 01/10/24  7:39 PM  Result Value Ref Range   Lactic Acid, Venous 1.3 0.5 - 1.9 mmol/L  TSH     Status: None   Collection Time: 01/10/24  7:39 PM  Result Value Ref Range   TSH 2.017 0.350 - 4.500 uIU/mL   CT ANGIO GI BLEED Result Date: 01/10/2024 CLINICAL DATA:  Lower abdominal discomfort. Hematochezia for the past week. History of breast cancer. EXAM: CTA ABDOMEN AND PELVIS WITHOUT AND WITH CONTRAST TECHNIQUE: Multidetector CT imaging of the abdomen and pelvis was performed using the standard protocol during bolus administration of intravenous contrast. Multiplanar reconstructed images and MIPs were obtained and reviewed to evaluate the vascular anatomy. RADIATION DOSE REDUCTION: This exam was performed according to the departmental dose-optimization program which  includes automated exposure control, adjustment of the mA and/or kV according to patient size and/or use of iterative reconstruction technique. CONTRAST:  OMNIPAQUE IOHEXOL 350 MG/ML SOLN COMPARISON:  CT chest, abdomen, and pelvis dated October 27, 2023. FINDINGS: VASCULAR Aorta: Normal caliber aorta without aneurysm, dissection, vasculitis or significant stenosis. Celiac: Patent. 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 Slight poststenotic dilatation of the downstream celiac artery. No aneurysm, dissection, or vasculitis. SMA: Patent without evidence of aneurysm, dissection, vasculitis or significant stenosis. Renals: Two right and single left renal arteries are patent without evidence of aneurysm, dissection, vasculitis, fibromuscular dysplasia or significant stenosis. IMA: Patent without evidence of aneurysm, dissection, vasculitis or significant stenosis. Inflow: Patent without evidence of aneurysm, dissection, vasculitis or significant stenosis. Proximal Outflow: Bilateral common femoral and visualized portions of the superficial and profunda femoral arteries are patent without evidence of aneurysm, dissection, vasculitis or significant stenosis. Veins: Unremarkable. Review of the MIP images confirms the above findings. NON-VASCULAR Lower chest: Small right lower lobe nodules are unchanged. No acute abnormality. Hepatobiliary: No focal liver abnormality is seen. No gallstones, gallbladder wall thickening, or biliary dilatation. Pancreas: Similar fatty atrophy of the pancreas. No ductal dilatation or surrounding inflammatory change. Spleen: Normal in size without focal abnormality. Adrenals/Urinary Tract: Adrenal glands are unremarkable. Kidneys are normal, without renal calculi, focal lesion, or hydronephrosis. Bladder is unremarkable. Stomach/Bowel: Unchangedmoderate hiatal hernia. The stomach is otherwise within normal limits. No intraluminal contrast  extravasation or pooling to suggest gastrointestinal bleeding. Small amount of high-density material in the cecum and proximal descending colon on noncontrast images. No bowel wall thickening, distention, or surrounding inflammatory changes. Normal diminutive appendix. Lymphatic: No enlarged abdominal or pelvic lymph nodes. Reproductive: Status post hysterectomy. No adnexal masses. Other: Trace free fluid in the pelvis, nonspecific. No pneumoperitoneum. Musculoskeletal: No acute or significant osseous findings. IMPRESSION: 1. No evidence of gastrointestinal bleeding. 2. 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 3. 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 Attention on follow-up imaging. 4. Unchanged moderate hiatal hernia. Electronically Signed   By: Obie Dredge M.D.   On: 01/10/2024 16:27     Assessment and Plan: * Lower GI bleed Hemoglobin is drifted down from 11.3 proximately a month ago to 9.9.   She is relatively stable tonight. Trend hemoglobin Consider GI consult in the morning if needed.  Rigors Patient is on significant chemotherapeutic agents and there is a question of whether she has some underlying infection. She did not mount a white count but might not. Respiratory panel is negative Urinalysis is questionable for UTI, will send for culture Begin Rocephin  Hyperlipidemia Continue atorvastatin  Malignant  neoplasm of upper-outer quadrant of left breast in female, estrogen receptor positive (HCC) Continue Verzenio, Femara  Essential hypertension Continue carvedilol, HCTZ, lisinopril, Lopressor      Advance Care Planning:   Code Status: Prior full  Consults: None, consider GI in the morning  Family Communication: Husband at bedside  Severity of Illness: The appropriate patient status for this patient is OBSERVATION. Observation status is judged to be reasonable and necessary in order to provide the required  intensity of service to ensure the patient's safety. The patient's presenting symptoms, physical exam findings, and initial radiographic and laboratory data in the context of their medical condition is felt to place them at decreased risk for further clinical deterioration. Furthermore, it is anticipated that the patient will be medically stable for discharge from the hospital within 2 midnights of admission.   Author: Reva Bores, MD 01/10/2024 5:58 PM  For on call review [email or web address].

## 2024-01-10 NOTE — Progress Notes (Signed)
 Prior-To-Admission Oral Chemotherapy for Treatment of Oncologic Disease   Order noted from Dr. Tinnie Gens to continue prior-to-admission oral chemotherapy regimen of Abemaciclib.  Procedure Per Pharmacy & Therapeutics Committee Policy: Orders for continuation of home oral chemotherapy for treatment of an oncologic disease will be held unless approved by an oncologist during current admission.    For patients receiving oncology care at [facility], inpatient pharmacist contacts patient's oncologist during regular office hours to review. If earlier review is medically necessary, attending physician consults [facility] on-call oncologist   For patients receiving oncology care outside of [facility], attending physician consults patient's oncologist to review. If this oncologist or their coverage cannot be reached, attending physician consults [facility] on-call oncologist   Oral chemotherapy continuation order is on hold pending oncologist review, [facility] oncologist Dr. Serena Croissant will be notified by inpatient pharmacy during office hours      Lucia Gaskins 01/10/2024, 7:45 PM

## 2024-01-10 NOTE — Assessment & Plan Note (Signed)
 On admission. Continue carvedilol, HCTZ, lisinopril, Lopressor  01-11-2024 stop hydrochlorothiazide/lisinopril will likely be getting GI bowel prep. Don't want to get her dehydrated.  01-12-2024 continue coreg at discharge. Hold hydrochlorothiazide/ACEI until seen by oncology next week to restart. Pt to stay hydrated at home.

## 2024-01-10 NOTE — Hospital Course (Signed)
 Patient is a 63 year old female with history of GERD, HTN, stage IV breast cancer on Verzenio who has been on a stable dose of this since 11/14/2023.  She recently had runny nose, denied any cough but over the last week has felt increasing weakness, and having no energy.  This is new for her.  She reports feeling lightheaded, dizzy, having headache for the last 3 days.  Over the last few nights she has had rigors and cold sweats but denies fever.  Patient also reports bright red bleeding noted on brown stool which is painless.  She has previously had a colonoscopy with [facility] approximately 2 years ago which showed several tubular adenomas and internal hemorrhoids but was otherwise normal.

## 2024-01-10 NOTE — Assessment & Plan Note (Signed)
 On admission. Hemoglobin is drifted down from 11.3 proximately a month ago to 9.9.  She is relatively stable tonight. Trend hemoglobin. Consider GI consult in the morning if needed.  01-11-2024 discussed with Dr. Pamelia Hoit. HgB stable at 10.3 g/dl. Oncology wants pt to be seen by GI consultant. Pt aware that she may need colonoscopy. Pt states she cannot take regular liquid bowel prep and needs tablet laxatives.  01-12-2024 likely due to GI irritation/hemorrhoids from [facility] from recently increased dose. Will hold Verzenio at discharge per oncology. F/u with oncology next week to restart back at lower dose.

## 2024-01-10 NOTE — Assessment & Plan Note (Signed)
 On admission. Continue [facility]  01-11-2024 hold Verzenio for now. Can continue femara  01-12-2024 hold verzenio at discharge per oncology. Dr. Pamelia Hoit will restart as outpatient.

## 2024-01-10 NOTE — Assessment & Plan Note (Signed)
 Continue atorvastatin

## 2024-01-10 NOTE — ED Provider Notes (Signed)
 63 yo female here with BRBPR Hgb drop at UC this week  Pending labs, CT imaging  Physical Exam  BP (!) 148/98 (BP Location: Right Arm)   Pulse (!) 105   Temp 99.4 F (37.4 C) (Oral)   Resp 18   Ht 6\' 2"  (1.88 m)   Wt 127 kg   SpO2 96%   BMI 35.95 kg/m   Physical Exam  Procedures  Procedures  ED Course / MDM    Medical Decision Making Amount and/or Complexity of Data Reviewed Labs: ordered. Radiology: ordered.  Risk Prescription drug management. Decision regarding hospitalization.   Lab reviewed, hgb 9.9 (down from 11 two weeks ago); CTA with no emergent findings; noted stable pulmonary nodules from metastatic cancer  When I reassessed the patient, she describes to me that she has had extreme fatigue, cold sweats, lightheadedness for the past week.  She is immunosuppressed due to her cancer treatment.  At this point I think we will broaden her infectious workup and admit her.  I do not suspect that her hemoglobin level has dropped low enough that this is the cause of her symptoms, but there was about a 1 g drop in her hemoglobin.  This may be due to lower GI bleeding or chemo medication side effect, as she does have mild pancytopenia.  We can trend her hemoglobin level overnight.  Will defer GI consult to inpatient service (pt follows with [place] who is not on call this evening), if there is further drop in hgb.    Blood cultures, Covid/flu, UA ordered Hold off on antibiotics until there is a clearer source Pt is not neutropenic    Terald Sleeper, MD 01/10/24 1739

## 2024-01-10 NOTE — ED Provider Notes (Signed)
 [place] EMERGENCY DEPARTMENT AT [facility] Provider Note   CSN: [id number] Arrival date & time: 01/10/24  1254     History {Add pertinent medical, surgical, social history, OB history to HPI:1} Chief Complaint  Patient presents with   Hematochezia    [name] is a 63 y.o. female.  63 year old female presents with hematochezia for 1 week.  Has been seen in urgent care center x 2 for this.  Her hemoglobin has dropped a gram and a half according to the records which I reviewed over the past week.  She notes dizziness when she tries to stand up.  Denies any abdominal pain.  No fever or chills.  No prior history of diverticulosis.  Does have a history of metastatic breast cancer.  Does not take any blood thinners.       Home Medications Prior to Admission medications   Medication Sig Start Date End Date Taking? Authorizing Provider  atorvastatin (LIPITOR) 10 MG tablet Take 1 tablet by mouth daily.    [provider]  carvedilol (COREG) 6.25 MG tablet TAKE 1 TABLET BY MOUTH TWICE DAILY Patient taking differently: Take 6.25 mg by mouth 2 (two) times daily with a meal. 03/03/15   Brock Bad, MD  diclofenac Sodium (VOLTAREN) 1 % GEL Apply 4 g topically 4 (four) times daily. 09/17/23   Edwin Cap, DPM  letrozole (FEMARA) 2.5 MG tablet Take 1 tablet (2.5 mg total) by mouth daily. 08/14/23   Serena Croissant, MD  lisinopril-hydrochlorothiazide (ZESTORETIC) 10-12.5 MG tablet Take 1 tablet by mouth daily.    [provider]  ondansetron (ZOFRAN) 4 MG tablet TAKE 1 TABLET BY MOUTH EVERY 8 HOURS AS NEEDED FOR NAUSEA AND VOMITING 12/14/23   Serena Croissant, MD  pantoprazole (PROTONIX) 40 MG tablet TAKE 1 TABLET BY MOUTH EVERY DAY 09/01/23   Serena Croissant, MD  VERZENIO 150 MG tablet TAKE 1 TABLET BY MOUTH 2 TIMES A DAY 12/23/23   Serena Croissant, MD  Vitamin D, Ergocalciferol, (DRISDOL) 1.25 MG (50000 UNIT) CAPS capsule TAKE 1 CAPSULE (50,000 UNITS  TOTAL) BY MOUTH EVERY 7 (SEVEN) DAYS 09/28/23   Edwin Cap, DPM      Allergies    Patient has no known allergies.    Review of Systems   Review of Systems  All other systems reviewed and are negative.   Physical Exam Updated Vital Signs BP (!) 148/98 (BP Location: Right Arm)   Pulse (!) 105   Temp 99.4 F (37.4 C) (Oral)   Resp 18   Ht 1.88 m (6\' 2" )   Wt 127 kg   SpO2 96%   BMI 35.95 kg/m  Physical Exam Vitals and nursing note reviewed. Exam conducted with a chaperone present.  Constitutional:      General: She is not in acute distress.    Appearance: Normal appearance. She is well-developed. She is not toxic-appearing.  HENT:     Head: Normocephalic and atraumatic.  Eyes:     General: Lids are normal.     Conjunctiva/sclera: Conjunctivae normal.     Pupils: Pupils are equal, round, and reactive to light.  Neck:     Thyroid: No thyroid mass.     Trachea: No tracheal deviation.  Cardiovascular:     Rate and Rhythm: Normal rate and regular rhythm.     Heart sounds: Normal heart sounds. No murmur heard.    No gallop.  Pulmonary:     Effort: Pulmonary effort is normal.  No respiratory distress.     Breath sounds: Normal breath sounds. No stridor. No decreased breath sounds, wheezing, rhonchi or rales.  Abdominal:     General: There is no distension.     Palpations: Abdomen is soft.     Tenderness: There is no abdominal tenderness. There is no rebound.  Genitourinary:    Comments: Slight blood noted on digital rectal exam Musculoskeletal:        General: No tenderness. Normal range of motion.     Cervical back: Normal range of motion and neck supple.  Skin:    General: Skin is warm and dry.     Findings: No abrasion or rash.  Neurological:     Mental Status: She is alert and oriented to person, place, and time. Mental status is at baseline.     GCS: GCS eye subscore is 4. GCS verbal subscore is 5. GCS motor subscore is 6.     Cranial Nerves: No cranial  nerve deficit.     Sensory: No sensory deficit.     Motor: Motor function is intact.  Psychiatric:        Attention and Perception: Attention normal.        Speech: Speech normal.        Behavior: Behavior normal.     ED Results / Procedures / Treatments   Labs (all labs ordered are listed, but only abnormal results are displayed) Labs Reviewed  CBC WITH DIFFERENTIAL/PLATELET  COMPREHENSIVE METABOLIC PANEL WITH GFR  I-STAT CHEM 8, ED  TYPE AND SCREEN    EKG None  Radiology No results found.  Procedures Procedures  {Document cardiac monitor, telemetry assessment procedure when appropriate:1}  Medications Ordered in ED Medications - No data to display  ED Course/ Medical Decision Making/ A&P   {   Click here for ABCD2, HEART and other calculatorsREFRESH Note before signing :1}                              Medical Decision Making Amount and/or Complexity of Data Reviewed Labs: ordered. Radiology: ordered.   ***  {Document critical care time when appropriate:1} {Document review of labs and clinical decision tools ie heart score, Chads2Vasc2 etc:1}  {Document your independent review of radiology images, and any outside records:1} {Document your discussion with family members, caretakers, and with consultants:1} {Document social determinants of health affecting pt's care:1} {Document your decision making why or why not admission, treatments were needed:1} Final Clinical Impression(s) / ED Diagnoses Final diagnoses:  None    Rx / DC Orders ED Discharge Orders     None

## 2024-01-10 NOTE — ED Triage Notes (Signed)
 Pt states that approximately one week ago she began noticing bright red blood in her bowel movements. Pt states that she had her Hgb checked on Monday and it was 11 and again today it had dropped to 10.3. Pt denies blood thinner use. Endorses dizziness and lower abd discomfort.

## 2024-01-10 NOTE — Assessment & Plan Note (Signed)
 Patient is on significant chemotherapeutic agents and there is a question of whether she has some underlying infection. She did not mount a white count but might not. Respiratory panel is negative Urinalysis is questionable for UTI, will send for culture Begin Rocephin

## 2024-01-11 ENCOUNTER — Telehealth: Payer: Self-pay

## 2024-01-11 ENCOUNTER — Encounter (INDEPENDENT_AMBULATORY_CARE_PROVIDER_SITE_OTHER): Payer: Self-pay | Admitting: Otolaryngology

## 2024-01-11 DIAGNOSIS — R6889 Other general symptoms and signs: Secondary | ICD-10-CM

## 2024-01-11 DIAGNOSIS — K529 Noninfective gastroenteritis and colitis, unspecified: Secondary | ICD-10-CM

## 2024-01-11 DIAGNOSIS — R1030 Lower abdominal pain, unspecified: Secondary | ICD-10-CM | POA: Diagnosis not present

## 2024-01-11 DIAGNOSIS — K625 Hemorrhage of anus and rectum: Secondary | ICD-10-CM

## 2024-01-11 DIAGNOSIS — K922 Gastrointestinal hemorrhage, unspecified: Secondary | ICD-10-CM | POA: Diagnosis not present

## 2024-01-11 DIAGNOSIS — Z170 Estrogen receptor positive status [ER+]: Secondary | ICD-10-CM

## 2024-01-11 DIAGNOSIS — E785 Hyperlipidemia, unspecified: Secondary | ICD-10-CM

## 2024-01-11 DIAGNOSIS — I10 Essential (primary) hypertension: Secondary | ICD-10-CM | POA: Diagnosis not present

## 2024-01-11 DIAGNOSIS — E66812 Obesity, class 2: Secondary | ICD-10-CM

## 2024-01-11 DIAGNOSIS — C50412 Malignant neoplasm of upper-outer quadrant of left female breast: Secondary | ICD-10-CM

## 2024-01-11 LAB — BASIC METABOLIC PANEL WITH GFR
Anion gap: 8 (ref 5–15)
BUN: 10 mg/dL (ref 8–23)
CO2: 25 mmol/L (ref 22–32)
Calcium: 9.3 mg/dL (ref 8.9–10.3)
Chloride: 104 mmol/L (ref 98–111)
Creatinine, Ser: 1.08 mg/dL — ABNORMAL HIGH (ref 0.44–1.00)
GFR, Estimated: 58 mL/min — ABNORMAL LOW (ref >60)
Glucose, Bld: 96 mg/dL (ref 70–99)
Potassium: 3.6 mmol/L (ref 3.5–5.1)
Sodium: 137 mmol/L (ref 135–145)

## 2024-01-11 LAB — CBC
HCT: 32.2 % — ABNORMAL LOW (ref 36.0–46.0)
Hemoglobin: 10.3 g/dL — ABNORMAL LOW (ref 12.0–15.0)
MCH: 31 pg (ref 26.0–34.0)
MCHC: 32 g/dL (ref 30.0–36.0)
MCV: 97 fL (ref 80.0–100.0)
Platelets: 119 10*3/uL — ABNORMAL LOW (ref 150–400)
RBC: 3.32 MIL/uL — ABNORMAL LOW (ref 3.87–5.11)
RDW: 12.8 % (ref 11.5–15.5)
WBC: 3.3 10*3/uL — ABNORMAL LOW (ref 4.0–10.5)
nRBC: 0 % (ref 0.0–0.2)

## 2024-01-11 LAB — HIV ANTIBODY (ROUTINE TESTING W REFLEX): HIV Screen 4th Generation wRfx: NONREACTIVE

## 2024-01-11 MED ORDER — HYDROCORTISONE ACETATE 25 MG RE SUPP
25.0000 mg | Freq: Every day | RECTAL | Status: DC
  Administered 2024-01-11: 25 mg via RECTAL
  Filled 2024-01-11: qty 1

## 2024-01-11 NOTE — Progress Notes (Addendum)
 PROGRESS NOTE    Zoanne Newill  OEV:[id number] DOB: [birth year] DOA: 01/10/2024 PCP: Elie Confer, NP  Subjective: Pt seen and examined. States she has had about 1 week worth of bright red rectal bleeding. Has been on increased dose of Verzenio since Feb 2025 due to metastatic breast CA. Had been in remission for about 2-3 years before lung mets found in 07-2023.  Pt has noted increased amounts of diarrhea since increasing dose of Verzenio.  Now having to wear fecal incontinence pads due to sudden urgency in diarrhea.  HgB has been stable since admission. Only having small amount of blood in stool.  Discussed with Dr. Pamelia Hoit. He wants to have pt seen by GI consult.   Hospital Course: HPI: Zoanne Newill is a 63 y.o. female with medical history significant of GERD, HTN, stage IV breast cancer on Verzenio who has been on a stable dose of this since 11/14/2023.  She recently had runny nose, denied any cough but over the last week has felt increasing weakness, and having no energy.  This is new for her.  She reports feeling lightheaded, dizzy, having headache for the last 3 days.  Over the last few nights she has had rigors and cold sweats but denies fever.  Patient also reports bright red bleeding noted on brown stool which is painless.  She has previously had a colonoscopy with [facility] approximately 2 years ago which showed several tubular adenomas and internal hemorrhoids but was otherwise normal.   Significant Events: Admitted 01/10/2024 for lower GI bleed   Significant Labs: Na 137, K 3.6, CO2 of 25, BUN 12, Scr 1.05, glu 91 WBC 4.1, Hg 9.9, Plt 103  Significant Imaging Studies: CT angio GI bleed No evidence of gastrointestinal bleeding. 2. 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 3. 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 Attention on follow-up imaging. 4. Unchanged moderate hiatal  hernia  Antibiotic Therapy: Anti-infectives (From admission, onward)    Start     Dose/Rate Route Frequency Ordered Stop   01/10/24 2200  cefTRIAXone (ROCEPHIN) 2 g in sodium chloride 0.9 % 100 mL IVPB        2 g 200 mL/hr over 30 Minutes Intravenous Every 24 hours 01/10/24 2138         Procedures:   Consultants:     Assessment and Plan: * Lower GI bleed On admission. Hemoglobin is drifted down from 11.3 proximately a month ago to 9.9.  She is relatively stable tonight. Trend hemoglobin. Consider GI consult in the morning if needed.  01-11-2024 discussed with Dr. Pamelia Hoit. HgB stable at 10.3 g/dl. Oncology wants pt to be seen by GI consultant. Pt aware that she may need colonoscopy. Pt states she cannot take regular liquid bowel prep and needs tablet laxatives.  Rigors On admission. Patient is on significant chemotherapeutic agents and there is a question of whether she has some underlying infection. She did not mount a white count but might not. Respiratory panel is negative. Urinalysis is questionable for UTI, will send for culture. Begin Rocephin  01-11-2024 continue IV rocephin for now. Awaiting urine cx.  Malignant neoplasm of upper-outer quadrant of left breast in female, estrogen receptor positive (HCC) On admission. Continue Bartlett Cellar  01-11-2024 hold Verzenio for now. Can continue femara  Obesity, Class II, BMI 35-39.9 Body mass index is 35.95 kg/m.   Hyperlipidemia 01-11-2024 Continue atorvastatin  Essential hypertension On admission. Continue carvedilol, HCTZ, lisinopril,  Lopressor  01-11-2024 stop hydrochlorothiazide/lisinopril will likely be getting GI bowel prep. Don't want to get her dehydrated.  DVT prophylaxis: Place and maintain sequential compression device Start: 01/11/24 1146. Pt is also ambulatory.    Code Status: Full Code Family Communication: no family at bedside. Pt is decisional. Disposition Plan: return home Reason for continuing  need for hospitalization: on IV rocephin. GI consulted.  Objective: Vitals:   01/11/24 0748 01/11/24 0800 01/11/24 0851 01/11/24 1212  BP: 125/81 123/77 123/77 107/69  Pulse: 73 85  67  Resp: 19   19  Temp: 97.8 F (36.6 C)   98 F (36.7 C)  TempSrc: Oral   Oral  SpO2: 100% 100%  100%  Weight:      Height:        Intake/Output Summary (Last 24 hours) at 01/11/2024 1325 Last data filed at 01/11/2024 1000 Gross per 24 hour  Intake 240 ml  Output --  Net 240 ml   Filed Weights   01/10/24 1258  Weight: 127 kg    Examination:  Physical Exam Vitals and nursing note reviewed.  Constitutional:      Appearance: She is obese.  HENT:     Head: Normocephalic and atraumatic.  Eyes:     General: No scleral icterus. Cardiovascular:     Rate and Rhythm: Normal rate and regular rhythm.  Pulmonary:     Effort: Pulmonary effort is normal.     Breath sounds: Normal breath sounds.  Abdominal:     General: Bowel sounds are normal. There is no distension.     Palpations: Abdomen is soft.     Tenderness: There is no abdominal tenderness.  Musculoskeletal:     Right lower leg: No edema.     Left lower leg: No edema.  Skin:    General: Skin is warm and dry.     Capillary Refill: Capillary refill takes less than 2 seconds.  Neurological:     Mental Status: She is alert and oriented to person, place, and time.     Data Reviewed: I have personally reviewed following labs and imaging studies  CBC: Recent Labs  Lab 01/10/24 1532 01/10/24 1633 01/11/24 0512  WBC  --  4.1 3.3*  NEUTROABS  --  2.6  --   HGB 10.2* 9.9* 10.3*  HCT 30.0* 29.8* 32.2*  MCV  --  94.3 97.0  PLT  --  103* 119*   Basic Metabolic Panel: Recent Labs  Lab 01/10/24 1450 01/10/24 1532 01/11/24 0512  NA 137 136 137  K 3.6 4.9 3.6  CL 103 103 104  CO2 25  --  25  GLUCOSE 91 85 96  BUN 12 13 10   CREATININE 1.05* 1.20* 1.08*  CALCIUM 9.2  --  9.3   GFR: Estimated Creatinine Clearance: 82 mL/min  (A) (by C-G formula based on SCr of 1.08 mg/dL (H)). Liver Function Tests: Recent Labs  Lab 01/10/24 1450  AST 20  ALT 18  ALKPHOS 53  BILITOT 1.0  PROT 7.2  ALBUMIN 3.5   Thyroid Function Tests: Recent Labs    01/10/24 1939  TSH 2.017   Sepsis Labs: Recent Labs  Lab 01/10/24 1939  LATICACIDVEN 1.3    Recent Results (from the past 240 hours)  Resp panel by RT-PCR (RSV, Flu A&B, Covid) Anterior Nasal Swab     Status: None   Collection Time: 01/10/24  5:35 PM   Specimen: Anterior Nasal Swab  Result Value Ref Range Status   SARS  Coronavirus 2 by RT PCR NEGATIVE NEGATIVE Final    Comment: (NOTE) SARS-CoV-2 target nucleic acids are NOT DETECTED.  The SARS-CoV-2 RNA is generally detectable in upper respiratory specimens during the acute phase of infection. The lowest concentration of SARS-CoV-2 viral copies this assay can detect is 138 copies/mL. A negative result does not preclude SARS-Cov-2 infection and should not be used as the sole basis for treatment or other patient management decisions. A negative result may occur with  improper specimen collection/handling, submission of specimen other than nasopharyngeal swab, presence of viral mutation(s) within the areas targeted by this assay, and inadequate number of viral copies(<138 copies/mL). A negative result must be combined with clinical observations, patient history, and epidemiological information. The expected result is Negative.  Fact Sheet for Patients:  BloggerCourse.com  Fact Sheet for Healthcare Providers:  SeriousBroker.it  This test is no t yet approved or cleared by the [place] FDA and  has been authorized for detection and/or diagnosis of SARS-CoV-2 by FDA under an Emergency Use Authorization (EUA). This EUA will remain  in effect (meaning this test can be used) for the duration of the COVID-19 declaration under Section 564(b)(1) of the Act,  21 U.S.C.section 360bbb-3(b)(1), unless the authorization is terminated  or revoked sooner.       Influenza A by PCR NEGATIVE NEGATIVE Final   Influenza B by PCR NEGATIVE NEGATIVE Final    Comment: (NOTE) The Xpert Xpress SARS-CoV-2/FLU/RSV plus assay is intended as an aid in the diagnosis of influenza from Nasopharyngeal swab specimens and should not be used as a sole basis for treatment. Nasal washings and aspirates are unacceptable for Xpert Xpress SARS-CoV-2/FLU/RSV testing.  Fact Sheet for Patients: BloggerCourse.com  Fact Sheet for Healthcare Providers: SeriousBroker.it  This test is not yet approved or cleared by the [place] FDA and has been authorized for detection and/or diagnosis of SARS-CoV-2 by FDA under an Emergency Use Authorization (EUA). This EUA will remain in effect (meaning this test can be used) for the duration of the COVID-19 declaration under Section 564(b)(1) of the Act, 21 U.S.C. section 360bbb-3(b)(1), unless the authorization is terminated or revoked.     Resp Syncytial Virus by PCR NEGATIVE NEGATIVE Final    Comment: (NOTE) Fact Sheet for Patients: BloggerCourse.com  Fact Sheet for Healthcare Providers: SeriousBroker.it  This test is not yet approved or cleared by the [place] FDA and has been authorized for detection and/or diagnosis of SARS-CoV-2 by FDA under an Emergency Use Authorization (EUA). This EUA will remain in effect (meaning this test can be used) for the duration of the COVID-19 declaration under Section 564(b)(1) of the Act, 21 U.S.C. section 360bbb-3(b)(1), unless the authorization is terminated or revoked.  Performed at [facility], [address] [place], [place] [address]   Blood culture (routine x 2)     Status: None (Preliminary result)   Collection Time: 01/10/24  5:36 PM   Specimen: BLOOD   Result Value Ref Range Status   Specimen Description   Final    BLOOD RIGHT ANTECUBITAL Performed at [facility], [address], [place], [place] [address]    Special Requests   Final    BOTTLES DRAWN AEROBIC AND ANAEROBIC Blood Culture results may not be optimal due to an inadequate volume of blood received in culture bottles Performed at [facility], [address], [place], [place] [address]    Culture   Final    NO GROWTH < 24 HOURS  Performed at [facility], [address], [place], [place] [address]    Report Status PENDING  Incomplete  Blood culture (routine x 2)     Status: None (Preliminary result)   Collection Time: 01/10/24  7:39 PM   Specimen: BLOOD  Result Value Ref Range Status   Specimen Description   Final    BLOOD BLOOD RIGHT HAND Performed at [facility], [address], [place], [place] [address]    Special Requests   Final    BOTTLES DRAWN AEROBIC ONLY Blood Culture results may not be optimal due to an inadequate volume of blood received in culture bottles Performed at [facility], [address], [place], [place] [address]    Culture   Final    NO GROWTH < 24 HOURS Performed at [facility], [address], [place], [place] [address]    Report Status PENDING  Incomplete     Radiology Studies: CT ANGIO GI BLEED Result Date: 01/10/2024 CLINICAL DATA:  Lower abdominal discomfort. Hematochezia for the past week. History of breast cancer. EXAM: CTA ABDOMEN AND PELVIS WITHOUT AND WITH CONTRAST TECHNIQUE: Multidetector CT imaging of the abdomen and pelvis was performed using the standard protocol during bolus administration of intravenous contrast. Multiplanar reconstructed images and MIPs were obtained and reviewed to evaluate the vascular anatomy. RADIATION DOSE REDUCTION: This exam was performed according to the departmental dose-optimization program which includes  automated exposure control, adjustment of the mA and/or kV according to patient size and/or use of iterative reconstruction technique. CONTRAST:  OMNIPAQUE IOHEXOL 350 MG/ML SOLN COMPARISON:  CT chest, abdomen, and pelvis dated October 27, 2023. FINDINGS: VASCULAR Aorta: Normal caliber aorta without aneurysm, dissection, vasculitis or significant stenosis. Celiac: Patent. 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 Slight poststenotic dilatation of the downstream celiac artery. No aneurysm, dissection, or vasculitis. SMA: Patent without evidence of aneurysm, dissection, vasculitis or significant stenosis. Renals: Two right and single left renal arteries are patent without evidence of aneurysm, dissection, vasculitis, fibromuscular dysplasia or significant stenosis. IMA: Patent without evidence of aneurysm, dissection, vasculitis or significant stenosis. Inflow: Patent without evidence of aneurysm, dissection, vasculitis or significant stenosis. Proximal Outflow: Bilateral common femoral and visualized portions of the superficial and profunda femoral arteries are patent without evidence of aneurysm, dissection, vasculitis or significant stenosis. Veins: Unremarkable. Review of the MIP images confirms the above findings. NON-VASCULAR Lower chest: Small right lower lobe nodules are unchanged. No acute abnormality. Hepatobiliary: No focal liver abnormality is seen. No gallstones, gallbladder wall thickening, or biliary dilatation. Pancreas: Similar fatty atrophy of the pancreas. No ductal dilatation or surrounding inflammatory change. Spleen: Normal in size without focal abnormality. Adrenals/Urinary Tract: Adrenal glands are unremarkable. Kidneys are normal, without renal calculi, focal lesion, or hydronephrosis. Bladder is unremarkable. Stomach/Bowel: Unchangedmoderate hiatal hernia. The stomach is otherwise within normal limits. No intraluminal contrast  extravasation or pooling to suggest gastrointestinal bleeding. Small amount of high-density material in the cecum and proximal descending colon on noncontrast images. No bowel wall thickening, distention, or surrounding inflammatory changes. Normal diminutive appendix. Lymphatic: No enlarged abdominal or pelvic lymph nodes. Reproductive: Status post hysterectomy. No adnexal masses. Other: Trace free fluid in the pelvis, nonspecific. No pneumoperitoneum. Musculoskeletal: No acute or significant osseous findings. IMPRESSION: 1. No evidence of gastrointestinal bleeding. 2. 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 3. 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 Attention on  follow-up imaging. 4. Unchanged moderate hiatal hernia. Electronically Signed   By: Obie Dredge M.D.   On: 01/10/2024 16:27    Scheduled Meds:  atorvastatin  10 mg Oral Daily   carvedilol  6.25 mg Oral BID WC   diclofenac Sodium  4 g Topical QID   letrozole  2.5 mg Oral Daily   pantoprazole  40 mg Oral Daily   thiamine  100 mg Oral Daily   Vitamin D (Ergocalciferol)  50,000 Units Oral Q7 days   Continuous Infusions:  cefTRIAXone (ROCEPHIN)  IV 2 g (01/10/24 2316)     LOS: 0 days   Time spent: 40 minutes  Carollee Herter, DO  [facility]  01/11/2024, 1:25 PM

## 2024-01-11 NOTE — Telephone Encounter (Signed)
 Pt called to let us know she is inpatient for hematochezia and cold chills without fever.  She is scheduled for appt with Dr Pamelia Hoit tomorrow and states she would like to keep appt and have a nurse transport her to [place] via w/c. Advised pt she is not able to see outpatient provider while inpatient and must wait 24 hrs post d/c before being seen. She is disappointed to hear this. Also advised pt per Dr Pamelia Hoit, verzenio is being held until we can find out what is causing sx.  She is in agreement. She knows to call and let us know when she will be d/c so we can r/s her MD appt to discuss Verzenio.

## 2024-01-11 NOTE — Plan of Care (Signed)

## 2024-01-11 NOTE — Subjective & Objective (Addendum)
 Pt seen and examined. States she has had about 1 week worth of bright red rectal bleeding. Has been on increased dose of Verzenio since Feb 2025 due to metastatic breast CA. Had been in remission for about 2-3 years before lung mets found in 07-2023.  Pt has noted increased amounts of diarrhea since increasing dose of Verzenio.  Now having to wear fecal incontinence pads due to sudden urgency in diarrhea.  HgB has been stable since admission. Only having small amount of blood in stool.  Discussed with Dr. Pamelia Hoit. He wants to have pt seen by GI consult.  Pt is ambulatory. Pt ambulated to take a shower this AM.

## 2024-01-11 NOTE — Progress Notes (Addendum)
 S: Starting to feel better today.  Profound diarrhea with bright red blood per rectum and anemia.:  Fatigue going on for the past 2 to 3 weeks which was exacerbated by diarrhea and causing her to get admitted to the hospital       01/11/2024   12:12 PM 01/11/2024    8:51 AM 01/11/2024    8:00 AM  Vitals with BMI  Systolic 107 123 253  Diastolic 69 77 77  Pulse 67  85      Latest Ref Rng & Units 01/11/2024    5:12 AM 01/10/2024    4:33 PM 01/10/2024    3:32 PM  CBC  WBC 4.0 - 10.5 K/uL 3.3  4.1    Hemoglobin 12.0 - 15.0 g/dL 66.4  9.9  40.3   Hematocrit 36.0 - 46.0 % 32.2  29.8  30.0   Platelets 150 - 400 K/uL 119  103        Latest Ref Rng & Units 01/11/2024    5:12 AM 01/10/2024    3:32 PM 01/10/2024    2:50 PM  CMP  Glucose 70 - 99 mg/dL 96  85  91   BUN 8 - 23 mg/dL 10  13  12    Creatinine 0.44 - 1.00 mg/dL 4.74  2.59  5.63   Sodium 135 - 145 mmol/L 137  136  137   Potassium 3.5 - 5.1 mmol/L 3.6  4.9  3.6   Chloride 98 - 111 mmol/L 104  103  103   CO2 22 - 32 mmol/L 25   25   Calcium 8.9 - 10.3 mg/dL 9.3   9.2   Total Protein 6.5 - 8.1 g/dL   7.2   Total Bilirubin 0.0 - 1.2 mg/dL   1.0   Alkaline Phos 38 - 126 U/L   53   AST 15 - 41 U/L   20   ALT 0 - 44 U/L   18    Assessment and plan: Metastatic breast cancer on Verzinio: Patient has been feeling miserable he has to 3 weeks with adverse effects from the treatment.  This included profound diarrhea most recently blood per rectum.  We discontinued it when she was in the hospital.  She tells me that today she feels significantly better after she was given Rocephin I recommendation is to hold off on the Verzinio.  Upon discharge we will start her back on the lower dosage of 100 mg.  She will contact her office and pick up 28-day supply medication. Cost of care: Patient has to $5000 every month on her medication.  This is unreasonable.we are trying to get her medication GI consulted  If she's stable and improving, likely  discharge tomorrow

## 2024-01-11 NOTE — Consult Note (Cosign Needed)
 Referring Provider: Dr. Carollee Herter  Primary Care Physician:  Elie Confer, NP Primary Gastroenterologist:  Dr. Barron Alvine  Reason for Consultation: Rectal bleeding  HPI: [name] is a 63 y.o. female with a past medical history of hypertension, obesity, GERD, colon polyps and metastatic breast cancer treated with neoadjuvant chemotherapy 1/30 - 12/20/2018 s/p left breast lumpectomy 02/2019 s/p radiation 7/8 - 06/02/2019 then subsequently diagnosed with lung metastasis and initiated on Verzenio 08/2023,   She developed bright red blood with her bowel movements 1 week ago and she subsequently developed dizziness and lower abdominal discomfort therefore she presented to the ED 01/10/2024 for further evaluation.  Labs in the ED showed a WBC count of 4.1.  Hemoglobin 10.2 and repeat Hg 9.9 (Hg 10.9 on 12/07/2023).  Platelets 103.  BUN 12.  Creatinine 1.05.  Total bili 1.0.  Alk phos 53.  AST 20.  ALT 18.  SARS coronavirus 2 negative.  Blood cultures no growth < 24 hrs. urinalysis with moderate hemoglobin and small leukocytes.  Urine culture pending.  HIV nonreactive.  Lactic acid 1.3.  TSH 2.017.  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 A GI consult was requested for further evaluation regarding rectal bleeding.   Today Hemoglobin 10.3.  Platelets 119.  She has chronic loose stools since starting Verzenio 100mg  bid as prescribed by her oncologist Dr. Pamelia Hoit 08/25/2023. She had more frequent loose stools with increased urgency and intermittent episodes of fecal incontinence when Verzenio dose was increased to 150mg  bid on 11/14/2023. She takes Loperamide one tab daily. She passed a loose stool with a moderate amount of  bright red blood on Sunday 01/03/2024 and two other episodes since then with the last  occurrence on 3/30 with associated lower abdominal pressure/tightness without significant pain.  She has not taking Verzenio since admission. No further diarrhea or hematochezia.  No BM thus far today.  She denies any prior history of rectal bleeding.  She underwent a colonoscopy 01/28/2022 which identified 4 small polyps removed from the colon, path report consistent with 2 tubular adenomas, hyperplastic polyp and benign colonic stool.  She has a history of GERD which is well-controlled on pantoprazole 40 mg once daily.  No black stools.  No NSAID use.   GI PROCEDURES:  Colonoscopy 01/28/2022: - Four 3 to 4 mm polyps in the descending colon and in the ascending colon, removed with a cold snare. Resected and retrieved. - Non-bleeding internal hemorrhoids. - 5 Year recall colonoscopy  - TUBULAR ADENOMA (2 OF 5 FRAGMENTS) - FIBROBLASTIC POLYP (1 OF 5 FRAGMENTS) - BENIGN COLONIC MUCOSA (2 OF 5 FRAGMENTS) - NO HIGH-GRADE DYSPLASIA OR MALIGNANCY IDENTIFIED  EGD 08/15/2008: Mid esophageal stricture Moderate reflux Hiatal hernia  Past Medical History:  Diagnosis Date   Complication of anesthesia    PONV   Fatigue    GERD (gastroesophageal reflux disease)    w nonobstructing esophageal stricture   Headache    Heat intolerance    History of ETT 03/2009   myoview EF 65%, normal wall motion, no ischemia or infarction, poor exercise capacity   HTN (hypertension)    left breast ca dx'd 10/2018   breast- left   Motion sickness    Normal echocardiogram 03/2009   EF 60-65% moderate diastolic dysfunction, mild LAE   Numbness and tingling  Obesity    PONV (postoperative nausea and vomiting)    S/P partial hysterectomy     Past Surgical History:  Procedure Laterality Date   ABDOMINAL HYSTERECTOMY  2007   partial    AXILLARY LYMPH NODE DISSECTION Left 03/11/2019   Procedure: LEFT AXILLARY LYMPH NODE DISSECTION;  Surgeon: Harriette Bouillon, MD;  Location: [facility];  Service:  General;  Laterality: Left;   BREAST LUMPECTOMY WITH RADIOACTIVE SEED AND SENTINEL LYMPH NODE BIOPSY Left 02/22/2019   Procedure: LEFT BREAST LUMPECTOMY x2 WITH RADIOACTIVE SEED AND LEFT AXILLA SEED  GUIDED LYMPH NODE BIOPSY AND LEFT SENTINEL LYMPH NODE MAPPING;  Surgeon: Harriette Bouillon, MD;  Location: [facility];  Service: General;  Laterality: Left;   BRONCHIAL BIOPSY  08/10/2023   Procedure: BRONCHIAL BIOPSIES;  Surgeon: Josephine Igo, DO;  Location: [facility];  Service: Pulmonary;;   CARPAL TUNNEL RELEASE Bilateral 2018   FOOT SURGERY Bilateral 2016   hysterectomy (other)     PORT-A-CATH REMOVAL N/A 02/22/2019   Procedure: REMOVAL PORT-A-CATH;  Surgeon: Harriette Bouillon, MD;  Location: [facility];  Service: General;  Laterality: N/A;   PORTACATH PLACEMENT N/A 11/10/2018   Procedure: INSERTION PORT-A-CATH WITH ULTRASOUND;  Surgeon: Harriette Bouillon, MD;  Location: [facility];  Service: General;  Laterality: N/A;   TONSILLECTOMY  1968   UPPER GASTROINTESTINAL ENDOSCOPY      Prior to Admission medications   Medication Sig Start Date End Date Taking? Authorizing Provider  atorvastatin (LIPITOR) 10 MG tablet Take 1 tablet by mouth daily.   Yes [provider]  carvedilol (COREG) 6.25 MG tablet TAKE 1 TABLET BY MOUTH TWICE DAILY Patient taking differently: Take 6.25 mg by mouth 2 (two) times daily with a meal. 03/03/15  Yes Brock Bad, MD  diclofenac Sodium (VOLTAREN) 1 % GEL Apply 4 g topically 4 (four) times daily. 09/17/23  Yes McDonald, Rachelle Hora, DPM  letrozole [facility]) 2.5 MG tablet Take 1 tablet (2.5 mg total) by mouth daily. 08/14/23  Yes Serena Croissant, MD  lisinopril-hydrochlorothiazide (ZESTORETIC) 10-12.5 MG tablet Take 1 tablet by mouth daily.   Yes [provider]  naproxen (NAPROSYN) 500 MG tablet Take 500 mg by mouth daily as needed for moderate pain (pain score 4-6).   Yes [provider]  ondansetron (ZOFRAN) 4 MG  tablet TAKE 1 TABLET BY MOUTH EVERY 8 HOURS AS NEEDED FOR NAUSEA AND VOMITING 12/14/23  Yes Serena Croissant, MD  pantoprazole (PROTONIX) 40 MG tablet TAKE 1 TABLET BY MOUTH EVERY DAY 09/01/23  Yes Serena Croissant, MD  VERZENIO 150 MG tablet TAKE 1 TABLET BY MOUTH 2 TIMES A DAY 12/23/23  Yes Serena Croissant, MD  Vitamin D, Ergocalciferol, (DRISDOL) 1.25 MG (50000 UNIT) CAPS capsule TAKE 1 CAPSULE (50,000 UNITS TOTAL) BY MOUTH EVERY 7 (SEVEN) DAYS 09/28/23  Yes Edwin Cap, DPM    Current Facility-Administered Medications  Medication Dose Route Frequency Provider Last Rate Last Admin   acetaminophen (TYLENOL) tablet 650 mg  650 mg Oral Q6H PRN Reva Bores, MD       Or   acetaminophen (TYLENOL) suppository 650 mg  650 mg Rectal Q6H PRN Reva Bores, MD       atorvastatin (LIPITOR) tablet 10 mg  10 mg Oral Daily Reva Bores, MD   10 mg at 01/11/24 0900   carvedilol (COREG) tablet 6.25 mg  6.25 mg Oral BID WC Reva Bores, MD   6.25 mg at 01/11/24 [phone]  cefTRIAXone (ROCEPHIN) 2 g in sodium chloride 0.9 % 100 mL IVPB  2 g Intravenous Q24H Reva Bores, MD 200 mL/hr at 01/10/24 2316 2 g at 01/10/24 2316   diclofenac Sodium (VOLTAREN) 1 % topical gel 4 g  4 g Topical QID Reva Bores, MD   4 g at 01/11/24 1308   fentaNYL (SUBLIMAZE) injection 12.5-50 mcg  12.5-50 mcg Intravenous Q2H PRN Reva Bores, MD       HYDROcodone-acetaminophen (NORCO/VICODIN) 5-325 MG per tablet 1-2 tablet  1-2 tablet Oral Q4H PRN Reva Bores, MD       letrozole [facility]) tablet 2.5 mg  2.5 mg Oral Daily Reva Bores, MD   2.5 mg at 01/11/24 0900   loperamide (IMODIUM) capsule 4 mg  4 mg Oral PRN Reva Bores, MD       metoprolol tartrate (LOPRESSOR) injection 5 mg  5 mg Intravenous Q6H PRN Reva Bores, MD       ondansetron [facility]) tablet 4 mg  4 mg Oral Q6H PRN Reva Bores, MD   4 mg at 01/10/24 2318   Oral care mouth rinse  15 mL Mouth Rinse PRN Reva Bores, MD       pantoprazole (PROTONIX) EC  tablet 40 mg  40 mg Oral Daily Reva Bores, MD   40 mg at 01/11/24 0900   polyethylene glycol (MIRALAX / GLYCOLAX) packet 17 g  17 g Oral Daily PRN Reva Bores, MD       thiamine (VITAMIN B1) tablet 100 mg  100 mg Oral Daily Reva Bores, MD   100 mg at 01/11/24 0900   traZODone (DESYREL) tablet 25 mg  25 mg Oral QHS PRN Reva Bores, MD       Vitamin D (Ergocalciferol) (DRISDOL) 1.25 MG (50000 UNIT) capsule 50,000 Units  50,000 Units Oral Q7 days Reva Bores, MD   50,000 Units at 01/10/24 2111   Facility-Administered Medications Ordered in Other Encounters  Medication Dose Route Frequency Provider Last Rate Last Admin   gadopentetate dimeglumine (MAGNEVIST) injection 20 mL  20 mL Intravenous Once PRN Anson Fret, MD       gadopentetate dimeglumine (MAGNEVIST) injection 20 mL  20 mL Intravenous Once PRN Anson Fret, MD        Allergies as of 01/10/2024   (No Known Allergies)    Family History  Problem Relation Age of Onset   Heart attack Mother 4   Heart disease Mother    Diabetes Sister    Heart attack Maternal Grandmother        age 10 or 40   Heart disease Maternal Grandmother    Neuropathy Neg Hx    Colon cancer Neg Hx    Colon polyps Neg Hx    Esophageal cancer Neg Hx    Stomach cancer Neg Hx    Rectal cancer Neg Hx     Social History   Socioeconomic History   Marital status: Married    Spouse name: Not on file   Number of children: 2   Years of education: Not on file   Highest education level: Not on file  Occupational History   Occupation: teacher  Tobacco Use   Smoking status: Never    Passive exposure: Never   Smokeless tobacco: Never  Vaping Use   Vaping status: Never Used  Substance and Sexual Activity   Alcohol use: No   Drug use: No   Sexual  activity: Yes    Birth control/protection: Surgical  Other Topics Concern   Not on file  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 w/ her family   Right-handed   Caffeine:  1/2 cup coffee during the school year, tea   Social Drivers of Health   Financial Resource Strain: Not on file  Food Insecurity: No Food Insecurity (01/10/2024)   Hunger Vital Sign    Worried About Running Out of Food in the Last Year: Never true    Ran Out of Food in the Last Year: Never true  Transportation Needs: No Transportation Needs (01/10/2024)   PRAPARE - Administrator, Civil Service (Medical): No    Lack of Transportation (Non-Medical): No  Physical Activity: Not on file  Stress: Not on file  Social Connections: Socially Integrated (01/10/2024)   Social Connection and Isolation Panel [NHANES]    Frequency of Communication with Friends and Family: Three times a week    Frequency of Social Gatherings with Friends and Family: Not on file    Attends Religious Services: More than 4 times per year    Active Member of [employer] or Organizations: Yes    Attends Engineer, structural: More than 4 times per year    Marital Status: Married  Catering manager Violence: Not At Risk (01/10/2024)   Humiliation, Afraid, Rape, and Kick questionnaire    Fear of Current or Ex-Partner: No    Emotionally Abused: No    Physically Abused: No    Sexually Abused: No   Review of Systems: Gen: Denies fever, sweats or chills. No weight loss.  CV: Denies chest pain, palpitations or edema. Resp: Denies cough, shortness of breath of hemoptysis.  GI:See HPI. GU : Denies urinary burning, blood in urine, increased urinary frequency or incontinence. MS: Denies joint pain, muscles aches or weakness. Derm: Denies rash, itchiness, skin lesions or unhealing ulcers. Psych: Denies depression, anxiety, memory loss or confusion. Heme: Denies easy bruising, bleeding. Neuro:  Denies headaches, dizziness or paresthesias. Endo:  Denies any problems with DM, thyroid or adrenal function.  Physical Exam: Vital signs in last 24 hours: Temp:  [97.8 F (36.6 C)-99.9 F (37.7 C)] 98 F (36.7 C) (03/31  1212) Pulse Rate:  [67-91] 67 (03/31 1212) Resp:  [17-20] 19 (03/31 1212) BP: (107-148)/(69-98) 107/69 (03/31 1212) SpO2:  [93 %-100 %] 100 % (03/31 1212) Last BM Date : 01/10/24 General: Alert 63 year old female in no acute distress. Head:  Normocephalic and atraumatic. Eyes:  No scleral icterus. Conjunctiva pink. Ears:  Normal auditory acuity. Nose:  No deformity, discharge or lesions. Mouth:  Dentition intact. No ulcers or lesions.  Neck:  Supple. No lymphadenopathy or thyromegaly.  Lungs: Breath sounds clear throughout. No wheezes, rhonchi or crackles.  Heart: Regular rate and rhythm, no murmurs. Abdomen: Soft, nondistended.  Nontender.  Positive bowel sounds all 4 quadrants. Rectal: Small external hemorrhoids without inflammation or active bleeding.  No anal fissure.  Small internal hemorrhoids palpated, limited rectal exam is there is a moderate amount of formed brown stool in the rectal vault.  No obvious blood.  CMA present at time of rectal exam. Musculoskeletal:  Symmetrical without gross deformities.  Pulses:  Normal pulses noted. Extremities:  Without clubbing or edema. Neurologic:  Alert and  oriented x 4. No focal deficits.  Skin:  Intact without significant lesions or rashes. Psych:  Alert and cooperative. Normal mood and affect.  Intake/Output from previous day: No intake/output data recorded.  Intake/Output this shift: Total I/O In: 240 [P.O.:240] Out: -   Lab Results: Recent Labs    01/10/24 1532 01/10/24 1633 01/11/24 0512  WBC  --  4.1 3.3*  HGB 10.2* 9.9* 10.3*  HCT 30.0* 29.8* 32.2*  PLT  --  103* 119*   BMET Recent Labs    01/10/24 1450 01/10/24 1532 01/11/24 0512  NA 137 136 137  K 3.6 4.9 3.6  CL 103 103 104  CO2 25  --  25  GLUCOSE 91 85 96  BUN 12 13 10   CREATININE 1.05* 1.20* 1.08*  CALCIUM 9.2  --  9.3   LFT Recent Labs    01/10/24 1450  PROT 7.2  ALBUMIN 3.5  AST 20  ALT 18  ALKPHOS 53  BILITOT 1.0   PT/INR No results  for input(s): "LABPROT", "INR" in the last 72 hours. Hepatitis Panel No results for input(s): "HEPBSAG", "HCVAB", "HEPAIGM", "HEPBIGM" in the last 72 hours.    Studies/Results: CT ANGIO GI BLEED Result Date: 01/10/2024 CLINICAL DATA:  Lower abdominal discomfort. Hematochezia for the past week. History of breast cancer. EXAM: CTA ABDOMEN AND PELVIS WITHOUT AND WITH CONTRAST TECHNIQUE: Multidetector CT imaging of the abdomen and pelvis was performed using the standard protocol during bolus administration of intravenous contrast. Multiplanar reconstructed images and MIPs were obtained and reviewed to evaluate the vascular anatomy. RADIATION DOSE REDUCTION: This exam was performed according to the departmental dose-optimization program which includes automated exposure control, adjustment of the mA and/or kV according to patient size and/or use of iterative reconstruction technique. CONTRAST:  OMNIPAQUE IOHEXOL 350 MG/ML SOLN COMPARISON:  CT chest, abdomen, and pelvis dated October 27, 2023. FINDINGS: VASCULAR Aorta: Normal caliber aorta without aneurysm, dissection, vasculitis or significant stenosis. Celiac: Patent. 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 Slight poststenotic dilatation of the downstream celiac artery. No aneurysm, dissection, or vasculitis. SMA: Patent without evidence of aneurysm, dissection, vasculitis or significant stenosis. Renals: Two right and single left renal arteries are patent without evidence of aneurysm, dissection, vasculitis, fibromuscular dysplasia or significant stenosis. IMA: Patent without evidence of aneurysm, dissection, vasculitis or significant stenosis. Inflow: Patent without evidence of aneurysm, dissection, vasculitis or significant stenosis. Proximal Outflow: Bilateral common femoral and visualized portions of the superficial and profunda femoral arteries are patent without evidence of aneurysm, dissection,  vasculitis or significant stenosis. Veins: Unremarkable. Review of the MIP images confirms the above findings. NON-VASCULAR Lower chest: Small right lower lobe nodules are unchanged. No acute abnormality. Hepatobiliary: No focal liver abnormality is seen. No gallstones, gallbladder wall thickening, or biliary dilatation. Pancreas: Similar fatty atrophy of the pancreas. No ductal dilatation or surrounding inflammatory change. Spleen: Normal in size without focal abnormality. Adrenals/Urinary Tract: Adrenal glands are unremarkable. Kidneys are normal, without renal calculi, focal lesion, or hydronephrosis. Bladder is unremarkable. Stomach/Bowel: Unchangedmoderate hiatal hernia. The stomach is otherwise within normal limits. No intraluminal contrast extravasation or pooling to suggest gastrointestinal bleeding. Small amount of high-density material in the cecum and proximal descending colon on noncontrast images. No bowel wall thickening, distention, or surrounding inflammatory changes. Normal diminutive appendix. Lymphatic: No enlarged abdominal or pelvic lymph nodes. Reproductive: Status post hysterectomy. No adnexal masses. Other: Trace free fluid in the pelvis, nonspecific. No pneumoperitoneum. Musculoskeletal: No acute or significant osseous findings. IMPRESSION: 1. No evidence of gastrointestinal bleeding. 2. 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 3. 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 Attention on  follow-up imaging. 4. Unchanged moderate hiatal hernia. Electronically Signed   By: Obie Dredge M.D.   On: 01/10/2024 16:27    IMPRESSION/PLAN:  63 year old female with metastatic breast cancer admitted 01/10/2024 which rectal bleeding and lower abdominal pressure/tightness discomfort. Chronic loose stools since starting Verzenio, with increased frequency and urgency since Verzenio dose was increased 11/2023. Suspect rectal bleeding, likely  from small internal hemorrhoids. CTA  was negative for active GI bleed. Hg stable 10.3.  No BM or rectal bleeding over night or thus far today. Hemodynamically stable.  -CBC in am -Anusol HC suppository 1 PR Q HS x 3 nights  -No recommendations for a diagnostic colonoscopy at this time -Continue to monitor patient for active GI bleeding  -Await further recommendations per Dr. Marina Goodell   History of colon polyps. Colonoscopy 01/28/2022 identified 4 polyps removed from the colon, path report consistent with 2 tubular adenomas, 1 fibroblastic polyp and 1 benign colon mucosa polyp.  -Next colonoscopy due 01/2027  History of GERD, hiatal hernia -Pantoprazole 40 mg daily   Malachi Carl Kennedy-Smith  01/11/2024, 5: 08 PM  GI ATTENDING  History, laboratories, x-rays, prior endoscopy reports reviewed.  Patient seen and examined.  Agree with comprehensive consultation as outlined above.  Patient presents with moderate rectal bleeding.  Negative CTA.  Hemoglobin stable.  Most likely benign anorectal source.  Colonoscopy up-to-date.  Agree with Anusol suppositories and observation.  Hopefully home soon.  Wilhemina Bonito. Eda Keys., M.D. [facility] Division of Gastroenterology

## 2024-01-11 NOTE — Progress Notes (Signed)
   01/11/24 1525  TOC Brief Assessment  Insurance and Status Reviewed  Patient has primary care physician Yes  Home environment has been reviewed Home w/ spouse  Prior level of function: Independent  Prior/Current Home Services No current home services  Social Drivers of Health Review SDOH reviewed no interventions necessary  Readmission risk has been reviewed Yes  Transition of care needs no transition of care needs at this time

## 2024-01-11 NOTE — Plan of Care (Signed)
  Problem: Clinical Measurements: Goal: Will remain free from infection Outcome: Progressing Goal: Cardiovascular complication will be avoided Outcome: Progressing   Problem: Safety: Goal: Ability to remain free from injury will improve Outcome: Progressing

## 2024-01-11 NOTE — Progress Notes (Signed)
 Prior-To-Admission Oral Chemotherapy for Treatment of Oncologic Disease   Order noted from Dr. Tinnie Gens to continue prior-to-admission oral chemotherapy regimen of Abemaciclib.  Procedure Per Pharmacy & Therapeutics Committee Policy: Orders for continuation of home oral chemotherapy for treatment of an oncologic disease will be held unless approved by an oncologist during current admission.    For patients receiving oncology care at [facility], inpatient pharmacist contacts patient's oncologist during regular office hours to review. If earlier review is medically necessary, attending physician consults [facility] on-call oncologist   For patients receiving oncology care outside of [facility], attending physician consults patient's oncologist to review. If this oncologist or their coverage cannot be reached, attending physician consults [facility] on-call oncologist   Oral chemotherapy continuation order is on hold pending oncologist review, [facility] oncologist Dr. Serena Croissant will be notified by inpatient pharmacy during office hours    Addendum: Contacted Dr Pamelia Hoit and he stated to hold Verzenio while inpatient.  Junita Push PharmD 01/11/2024, 5:52 AM

## 2024-01-11 NOTE — Assessment & Plan Note (Signed)
Body mass index is 35.95 kg/m².

## 2024-01-11 NOTE — Hospital Course (Signed)
 HPI: [name] is a 63 y.o. female with medical history significant of GERD, HTN, stage IV breast cancer on Verzenio who has been on a stable dose of this since 11/14/2023.  She recently had runny nose, denied any cough but over the last week has felt increasing weakness, and having no energy.  This is new for her.  She reports feeling lightheaded, dizzy, having headache for the last 3 days.  Over the last few nights she has had rigors and cold sweats but denies fever.  Patient also reports bright red bleeding noted on brown stool which is painless.  She has previously had a colonoscopy with [facility] approximately 2 years ago which showed several tubular adenomas and internal hemorrhoids but was otherwise normal.   Significant Events: Admitted 01/10/2024 for lower GI bleed   Significant Labs: Na 137, K 3.6, CO2 of 25, BUN 12, Scr 1.05, glu 91 WBC 4.1, Hg 9.9, Plt 103  Significant Imaging Studies: CT angio GI bleed No evidence of gastrointestinal bleeding. 2. 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 3. 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 Attention on follow-up imaging. 4. Unchanged moderate hiatal hernia  Antibiotic Therapy: Anti-infectives (From admission, onward)    Start     Dose/Rate Route Frequency Ordered Stop   01/10/24 2200  cefTRIAXone (ROCEPHIN) 2 g in sodium chloride 0.9 % 100 mL IVPB        2 g 200 mL/hr over 30 Minutes Intravenous Every 24 hours 01/10/24 2138         Procedures:   Consultants: Oncology GI

## 2024-01-12 ENCOUNTER — Other Ambulatory Visit

## 2024-01-12 ENCOUNTER — Ambulatory Visit: Admitting: Hematology and Oncology

## 2024-01-12 DIAGNOSIS — R6889 Other general symptoms and signs: Secondary | ICD-10-CM | POA: Diagnosis not present

## 2024-01-12 DIAGNOSIS — K921 Melena: Secondary | ICD-10-CM

## 2024-01-12 DIAGNOSIS — K922 Gastrointestinal hemorrhage, unspecified: Secondary | ICD-10-CM | POA: Diagnosis not present

## 2024-01-12 DIAGNOSIS — K625 Hemorrhage of anus and rectum: Secondary | ICD-10-CM | POA: Diagnosis not present

## 2024-01-12 DIAGNOSIS — I10 Essential (primary) hypertension: Secondary | ICD-10-CM | POA: Diagnosis not present

## 2024-01-12 DIAGNOSIS — D709 Neutropenia, unspecified: Secondary | ICD-10-CM | POA: Diagnosis not present

## 2024-01-12 DIAGNOSIS — C50412 Malignant neoplasm of upper-outer quadrant of left female breast: Secondary | ICD-10-CM | POA: Diagnosis not present

## 2024-01-12 DIAGNOSIS — K648 Other hemorrhoids: Secondary | ICD-10-CM

## 2024-01-12 DIAGNOSIS — K529 Noninfective gastroenteritis and colitis, unspecified: Secondary | ICD-10-CM | POA: Diagnosis not present

## 2024-01-12 DIAGNOSIS — R1030 Lower abdominal pain, unspecified: Secondary | ICD-10-CM | POA: Diagnosis not present

## 2024-01-12 DIAGNOSIS — D638 Anemia in other chronic diseases classified elsewhere: Secondary | ICD-10-CM

## 2024-01-12 LAB — BASIC METABOLIC PANEL WITH GFR
Anion gap: 8 (ref 5–15)
BUN: 13 mg/dL (ref 8–23)
CO2: 27 mmol/L (ref 22–32)
Calcium: 9.1 mg/dL (ref 8.9–10.3)
Chloride: 103 mmol/L (ref 98–111)
Creatinine, Ser: 1.13 mg/dL — ABNORMAL HIGH (ref 0.44–1.00)
GFR, Estimated: 55 mL/min — ABNORMAL LOW (ref >60)
Glucose, Bld: 105 mg/dL — ABNORMAL HIGH (ref 70–99)
Potassium: 3.6 mmol/L (ref 3.5–5.1)
Sodium: 138 mmol/L (ref 135–145)

## 2024-01-12 LAB — CBC WITH DIFFERENTIAL/PLATELET
Abs Immature Granulocytes: 0 10*3/uL (ref 0.00–0.07)
Basophils Absolute: 0 10*3/uL (ref 0.0–0.1)
Basophils Relative: 0 %
Eosinophils Absolute: 0 10*3/uL (ref 0.0–0.5)
Eosinophils Relative: 1 %
HCT: 30.4 % — ABNORMAL LOW (ref 36.0–46.0)
Hemoglobin: 9.9 g/dL — ABNORMAL LOW (ref 12.0–15.0)
Immature Granulocytes: 0 %
Lymphocytes Relative: 36 %
Lymphs Abs: 1 10*3/uL (ref 0.7–4.0)
MCH: 31.4 pg (ref 26.0–34.0)
MCHC: 32.6 g/dL (ref 30.0–36.0)
MCV: 96.5 fL (ref 80.0–100.0)
Monocytes Absolute: 0.4 10*3/uL (ref 0.1–1.0)
Monocytes Relative: 15 %
Neutro Abs: 1.3 10*3/uL — ABNORMAL LOW (ref 1.7–7.7)
Neutrophils Relative %: 48 %
Platelets: 123 10*3/uL — ABNORMAL LOW (ref 150–400)
RBC: 3.15 MIL/uL — ABNORMAL LOW (ref 3.87–5.11)
RDW: 12.7 % (ref 11.5–15.5)
WBC: 2.8 10*3/uL — ABNORMAL LOW (ref 4.0–10.5)
nRBC: 0 % (ref 0.0–0.2)

## 2024-01-12 MED ORDER — HYDROCORTISONE ACETATE 25 MG RE SUPP: 25.0000 mg | Freq: Every day | RECTAL | 0 refills | Status: AC

## 2024-01-12 MED ORDER — CEPHALEXIN 750 MG PO CAPS: 750.0000 mg | ORAL_CAPSULE | Freq: Two times a day (BID) | ORAL | 0 refills | Status: AC

## 2024-01-12 NOTE — Discharge Summary (Signed)
 [facility] Phys[facility] Discharge Summary   Patient name: [name]  Admit date:     01/10/2024  Discharge date: 01/12/2024  Attending Physician: Samara Snide  Discharge Physician: Carollee Herter   PCP: Elie Confer, NP  Admitted From: Home  Disposition:  Home  Recommendations for Outpatient Follow-up:  Follow up with PCP in 1-2 weeks F/U with oncology next week Please follow up on the following pending results: urine cx results.  Home Health:No Equipment/Devices: None  Discharge Condition:Stable CODE STATUS:FULL Diet recommendation: Heart Healthy Fluid Restriction: None  Hospital Summary: HPI: [name] is a 63 y.o. female with medical history significant of GERD, HTN, stage IV breast cancer on Verzenio who has been on a stable dose of this since 11/14/2023.  She recently had runny nose, denied any cough but over the last week has felt increasing weakness, and having no energy.  This is new for her.  She reports feeling lightheaded, dizzy, having headache for the last 3 days.  Over the last few nights she has had rigors and cold sweats but denies fever.  Patient also reports bright red bleeding noted on brown stool which is painless.  She has previously had a colonoscopy with [facility] approximately 2 years ago which showed several tubular adenomas and internal hemorrhoids but was otherwise normal.   Significant Events: Admitted 01/10/2024 for lower GI bleed   Significant Labs: Na 137, K 3.6, CO2 of 25, BUN 12, Scr 1.05, glu 91 WBC 4.1, Hg 9.9, Plt 103  Significant Imaging Studies: CT angio GI bleed No evidence of gastrointestinal bleeding. 2. 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 3. 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 Attention on follow-up imaging. 4. Unchanged moderate hiatal hernia  Antibiotic Therapy: Anti-infectives (From admission, onward)    Start      Dose/Rate Route Frequency Ordered Stop   01/10/24 2200  cefTRIAXone (ROCEPHIN) 2 g in sodium chloride 0.9 % 100 mL IVPB        2 g 200 mL/hr over 30 Minutes Intravenous Every 24 hours 01/10/24 2138         Procedures:   Consultants: Oncology GI   Hospital Course by Problem: * Lower GI bleed On admission. Hemoglobin is drifted down from 11.3 proximately a month ago to 9.9.  She is relatively stable tonight. Trend hemoglobin. Consider GI consult in the morning if needed.  01-11-2024 discussed with Dr. Pamelia Hoit. HgB stable at 10.3 g/dl. Oncology wants pt to be seen by GI consultant. Pt aware that she may need colonoscopy. Pt states she cannot take regular liquid bowel prep and needs tablet laxatives.  01-12-2024 likely due to GI irritation/hemorrhoids from [facility] from recently increased dose. Will hold Verzenio at discharge per oncology. F/u with oncology next week to restart back at lower dose.  Rigors On admission. Patient is on significant chemotherapeutic agents and there is a question of whether she has some underlying infection. She did not mount a white count but might not. Respiratory panel is negative. Urinalysis is questionable for UTI, will send for culture. Begin Rocephin  01-11-2024 continue IV rocephin for now. Awaiting urine cx.  01-12-2024 no further rigors. Urine cx not resulted yet. Will send home with po keflex for 3 days.  Malignant neoplasm of upper-outer quadrant of left breast in female, estrogen receptor positive (HCC) On admission. Continue [place] [facility]  01-11-2024 hold Verzenio for now. Can continue femara  01-12-2024 hold  verzenio at discharge per oncology. Dr. Pamelia Hoit will restart as outpatient.  Obesity, Class II, BMI 35-39.9 Body mass index is 35.95 kg/m.   Hyperlipidemia 01-11-2024 Continue atorvastatin  Essential hypertension On admission. Continue carvedilol, HCTZ, lisinopril, Lopressor  01-11-2024 stop  hydrochlorothiazide/lisinopril will likely be getting GI bowel prep. Don't want to get her dehydrated.  01-12-2024 continue coreg at discharge. Hold hydrochlorothiazide/ACEI until seen by oncology next week to restart. Pt to stay hydrated at home.    Discharge Diagnoses:  Principal Problem:   Lower GI bleed Active Problems:   Malignant neoplasm of upper-outer quadrant of left breast in female, estrogen receptor positive (HCC)   Rigors   Essential hypertension   Hyperlipidemia   Obesity, Class II, BMI 35-39.9   Hematochezia   Internal hemorrhoids   Anemia of chronic disease   Discharge Instructions  Discharge Instructions     Call MD for:  difficulty breathing, headache or visual disturbances   Complete by: As directed    Call MD for:  extreme fatigue   Complete by: As directed    Call MD for:  hives   Complete by: As directed    Call MD for:  persistant dizziness or light-headedness   Complete by: As directed    Call MD for:  persistant nausea and vomiting   Complete by: As directed    Call MD for:  redness, tenderness, or signs of infection (pain, swelling, redness, odor or green/yellow discharge around incision site)   Complete by: As directed    Call MD for:  severe uncontrolled pain   Complete by: As directed    Call MD for:  temperature >100.4   Complete by: As directed    Diet - low sodium heart healthy   Complete by: As directed    Discharge instructions   Complete by: As directed    1. Follow up with your primary care provider in 1-2 weeks following discharge from hospital. 2. Follow up with oncology next week. 3. Do NOT give yourself anymore Verzenio until you seen Dr. Pamelia Hoit in oncology clinic next week.   Increase activity slowly   Complete by: As directed       Allergies as of 01/12/2024   No Known Allergies      Medication List     PAUSE taking these medications    lisinopril-hydrochlorothiazide 10-12.5 MG tablet Wait to take this until your  doctor or other care provider tells you to start again. Hold until seen by oncology next week and told to restart Commonly known as: ZESTORETIC Take 1 tablet by mouth daily.   Verzenio 150 MG tablet Wait to take this until your doctor or other care provider tells you to start again. Hold until seen by oncology next week and told to restart Generic drug: abemaciclib TAKE 1 TABLET BY MOUTH 2 TIMES A DAY       TAKE these medications    atorvastatin 10 MG tablet Commonly known as: LIPITOR Take 1 tablet by mouth daily.   carvedilol 6.25 MG tablet Commonly known as: COREG TAKE 1 TABLET BY MOUTH TWICE DAILY What changed: when to take this   cephALEXin 750 MG capsule Commonly known as: Keflex Take 1 capsule (750 mg total) by mouth in the morning and at bedtime for 3 days.   diclofenac Sodium 1 % Gel Commonly known as: VOLTAREN Apply 4 g topically 4 (four) times daily.   hydrocortisone 25 MG suppository Commonly known as: ANUSOL-HC Place 1 suppository (25 mg total)  rectally at bedtime for 12 days.   letrozole 2.5 MG tablet Commonly known as: FEMARA Take 1 tablet (2.5 mg total) by mouth daily.   naproxen 500 MG tablet Commonly known as: NAPROSYN Take 500 mg by mouth daily as needed for moderate pain (pain score 4-6).   ondansetron 4 MG tablet Commonly known as: ZOFRAN TAKE 1 TABLET BY MOUTH EVERY 8 HOURS AS NEEDED FOR NAUSEA AND VOMITING   pantoprazole 40 MG tablet Commonly known as: PROTONIX TAKE 1 TABLET BY MOUTH EVERY DAY   Vitamin D (Ergocalciferol) 1.25 MG (50000 UNIT) Caps capsule Commonly known as: DRISDOL TAKE 1 CAPSULE (50,000 UNITS TOTAL) BY MOUTH EVERY 7 (SEVEN) DAYS        No Known Allergies  Discharge Exam: Vitals:   01/11/24 1952 01/12/24 0435  BP: 117/76 126/87  Pulse: 88 78  Resp: 18 18  Temp: 98.1 F (36.7 C) 98.2 F (36.8 C)  SpO2: 98% 97%    Physical Exam Vitals and nursing note reviewed.  Constitutional:      General: She is not  in acute distress.    Appearance: She is normal weight. She is not toxic-appearing or diaphoretic.  HENT:     Head: Normocephalic and atraumatic.     Nose: Nose normal.  Cardiovascular:     Rate and Rhythm: Normal rate and regular rhythm.  Pulmonary:     Effort: Pulmonary effort is normal.     Breath sounds: Normal breath sounds.  Abdominal:     General: Bowel sounds are normal. There is no distension.     Palpations: Abdomen is soft.  Musculoskeletal:     Right lower leg: No edema.     Left lower leg: No edema.  Skin:    General: Skin is warm and dry.     Capillary Refill: Capillary refill takes less than 2 seconds.  Neurological:     General: No focal deficit present.     Mental Status: She is alert and oriented to person, place, and time.     The results of significant diagnostics from this hospitalization (including imaging, microbiology, ancillary and laboratory) are listed below for reference.    Microbiology: Recent Results (from the past 240 hours)  Resp panel by RT-PCR (RSV, Flu A&B, Covid) Anterior Nasal Swab     Status: None   Collection Time: 01/10/24  5:35 PM   Specimen: Anterior Nasal Swab  Result Value Ref Range Status   SARS Coronavirus 2 by RT PCR NEGATIVE NEGATIVE Final    Comment: (NOTE) SARS-CoV-2 target nucleic acids are NOT DETECTED.  The SARS-CoV-2 RNA is generally detectable in upper respiratory specimens during the acute phase of infection. The lowest concentration of SARS-CoV-2 viral copies this assay can detect is 138 copies/mL. A negative result does not preclude SARS-Cov-2 infection and should not be used as the sole basis for treatment or other patient management decisions. A negative result may occur with  improper specimen collection/handling, submission of specimen other than nasopharyngeal swab, presence of viral mutation(s) within the areas targeted by this assay, and inadequate number of viral copies(<138 copies/mL). A negative result  must be combined with clinical observations, patient history, and epidemiological information. The expected result is Negative.  Fact Sheet for Patients:  BloggerCourse.com  Fact Sheet for Healthcare Providers:  SeriousBroker.it  This test is no t yet approved or cleared by the [place] FDA and  has been authorized for detection and/or diagnosis of SARS-CoV-2 by FDA under an Emergency Use Authorization (  EUA). This EUA will remain  in effect (meaning this test can be used) for the duration of the COVID-19 declaration under Section 564(b)(1) of the Act, 21 U.S.C.section 360bbb-3(b)(1), unless the authorization is terminated  or revoked sooner.       Influenza A by PCR NEGATIVE NEGATIVE Final   Influenza B by PCR NEGATIVE NEGATIVE Final    Comment: (NOTE) The Xpert Xpress SARS-CoV-2/FLU/RSV plus assay is intended as an aid in the diagnosis of influenza from Nasopharyngeal swab specimens and should not be used as a sole basis for treatment. Nasal washings and aspirates are unacceptable for Xpert Xpress SARS-CoV-2/FLU/RSV testing.  Fact Sheet for Patients: BloggerCourse.com  Fact Sheet for Healthcare Providers: SeriousBroker.it  This test is not yet approved or cleared by the [place] FDA and has been authorized for detection and/or diagnosis of SARS-CoV-2 by FDA under an Emergency Use Authorization (EUA). This EUA will remain in effect (meaning this test can be used) for the duration of the COVID-19 declaration under Section 564(b)(1) of the Act, 21 U.S.C. section 360bbb-3(b)(1), unless the authorization is terminated or revoked.     Resp Syncytial Virus by PCR NEGATIVE NEGATIVE Final    Comment: (NOTE) Fact Sheet for Patients: BloggerCourse.com  Fact Sheet for Healthcare Providers: SeriousBroker.it  This test is  not yet approved or cleared by the [place] FDA and has been authorized for detection and/or diagnosis of SARS-CoV-2 by FDA under an Emergency Use Authorization (EUA). This EUA will remain in effect (meaning this test can be used) for the duration of the COVID-19 declaration under Section 564(b)(1) of the Act, 21 U.S.C. section 360bbb-3(b)(1), unless the authorization is terminated or revoked.  Performed at [facility], [address], [place], [place] [address]   Blood culture (routine x 2)     Status: None (Preliminary result)   Collection Time: 01/10/24  5:36 PM   Specimen: BLOOD  Result Value Ref Range Status   Specimen Description   Final    BLOOD RIGHT ANTECUBITAL Performed at [facility], [address], [place], [place] [address]    Special Requests   Final    BOTTLES DRAWN AEROBIC AND ANAEROBIC Blood Culture results may not be optimal due to an inadequate volume of blood received in culture bottles Performed at [facility], [address] [place], [place] [address]    Culture   Final    NO GROWTH 2 DAYS Performed at [facility], [address], [place], [place] [address]    Report Status PENDING  Incomplete  Blood culture (routine x 2)     Status: None (Preliminary result)   Collection Time: 01/10/24  7:39 PM   Specimen: BLOOD  Result Value Ref Range Status   Specimen Description   Final    BLOOD BLOOD RIGHT HAND Performed at [facility], [address], [place], [place] [address]    Special Requests   Final    BOTTLES DRAWN AEROBIC ONLY Blood Culture results may not be optimal due to an inadequate volume of blood received in culture bottles Performed at [facility], [address] [place], [place] [address]    Culture   Final    NO GROWTH 2 DAYS Performed at [facility], [address], [place], [place] [address]    Report Status PENDING   Incomplete     Labs:  Basic Metabolic Panel: Recent Labs  Lab 01/10/24 1450 01/10/24 1532  01/11/24 0512 01/12/24 0541  NA 137 136 137 138  K 3.6 4.9 3.6 3.6  CL 103 103 104 103  CO2 25  --  25 27  GLUCOSE 91 85 96 105*  BUN 12 13 10 13   CREATININE 1.05* 1.20* 1.08* 1.13*  CALCIUM 9.2  --  9.3 9.1   Liver Function Tests: Recent Labs  Lab 01/10/24 1450  AST 20  ALT 18  ALKPHOS 53  BILITOT 1.0  PROT 7.2  ALBUMIN 3.5   CBC: Recent Labs  Lab 01/10/24 1532 01/10/24 1633 01/11/24 0512 01/12/24 0541  WBC  --  4.1 3.3* 2.8*  NEUTROABS  --  2.6  --  1.3*  HGB 10.2* 9.9* 10.3* 9.9*  HCT 30.0* 29.8* 32.2* 30.4*  MCV  --  94.3 97.0 96.5  PLT  --  103* 119* 123*   Thyroid function studies Recent Labs    01/10/24 1939  TSH 2.017   Urinalysis    Component Value Date/Time   COLORURINE YELLOW 01/10/2024 1801   APPEARANCEUR CLEAR 01/10/2024 1801   APPEARANCEUR Clear 07/07/2014 0204   LABSPEC 1.008 01/10/2024 1801   LABSPEC 1.011 07/07/2014 0204   PHURINE 6.0 01/10/2024 1801   GLUCOSEU NEGATIVE 01/10/2024 1801   GLUCOSEU Negative 07/07/2014 0204   HGBUR MODERATE (A) 01/10/2024 1801   BILIRUBINUR NEGATIVE 01/10/2024 1801   BILIRUBINUR Negative 07/07/2014 0204   KETONESUR 5 (A) 01/10/2024 1801   PROTEINUR NEGATIVE 01/10/2024 1801   UROBILINOGEN 0.2 08/17/2007 1555   NITRITE NEGATIVE 01/10/2024 1801   LEUKOCYTESUR SMALL (A) 01/10/2024 1801   LEUKOCYTESUR Negative 07/07/2014 0204   Sepsis Labs Recent Labs  Lab 01/10/24 1633 01/11/24 0512 01/12/24 0541  WBC 4.1 3.3* 2.8*    Procedures/Studies: CT ANGIO GI BLEED Result Date: 01/10/2024 CLINICAL DATA:  Lower abdominal discomfort. Hematochezia for the past week. History of breast cancer. EXAM: CTA ABDOMEN AND PELVIS WITHOUT AND WITH CONTRAST TECHNIQUE: Multidetector CT imaging of the abdomen and pelvis was performed using the standard protocol during bolus administration of intravenous contrast. Multiplanar  reconstructed images and MIPs were obtained and reviewed to evaluate the vascular anatomy. RADIATION DOSE REDUCTION: This exam was performed according to the departmental dose-optimization program which includes automated exposure control, adjustment of the mA and/or kV according to patient size and/or use of iterative reconstruction technique. CONTRAST:  OMNIPAQUE IOHEXOL 350 MG/ML SOLN COMPARISON:  CT chest, abdomen, and pelvis dated October 27, 2023. FINDINGS: VASCULAR Aorta: Normal caliber aorta without aneurysm, dissection, vasculitis or significant stenosis. Celiac: Patent. 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 Slight poststenotic dilatation of the downstream celiac artery. No aneurysm, dissection, or vasculitis. SMA: Patent without evidence of aneurysm, dissection, vasculitis or significant stenosis. Renals: Two right and single left renal arteries are patent without evidence of aneurysm, dissection, vasculitis, fibromuscular dysplasia or significant stenosis. IMA: Patent without evidence of aneurysm, dissection, vasculitis or significant stenosis. Inflow: Patent without evidence of aneurysm, dissection, vasculitis or significant stenosis. Proximal Outflow: Bilateral common femoral and visualized portions of the superficial and profunda femoral arteries are patent without evidence of aneurysm, dissection, vasculitis or significant stenosis. Veins: Unremarkable. Review of the MIP images confirms the above findings. NON-VASCULAR Lower chest: Small right lower lobe nodules are unchanged. No acute abnormality. Hepatobiliary: No focal liver abnormality is seen. No gallstones, gallbladder wall thickening, or biliary dilatation. Pancreas: Similar fatty atrophy of the pancreas. No ductal dilatation or surrounding inflammatory change. Spleen: Normal in size without focal abnormality. Adrenals/Urinary  Tract: Adrenal glands are unremarkable. Kidneys are  normal, without renal calculi, focal lesion, or hydronephrosis. Bladder is unremarkable. Stomach/Bowel: Unchangedmoderate hiatal hernia. The stomach is otherwise within normal limits. No intraluminal contrast extravasation or pooling to suggest gastrointestinal bleeding. Small amount of high-density material in the cecum and proximal descending colon on noncontrast images. No bowel wall thickening, distention, or surrounding inflammatory changes. Normal diminutive appendix. Lymphatic: No enlarged abdominal or pelvic lymph nodes. Reproductive: Status post hysterectomy. No adnexal masses. Other: Trace free fluid in the pelvis, nonspecific. No pneumoperitoneum. Musculoskeletal: No acute or significant osseous findings. IMPRESSION: 1. No evidence of gastrointestinal bleeding. 2. 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 3. 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 Attention on follow-up imaging. 4. Unchanged moderate hiatal hernia. Electronically Signed   By: Obie Dredge M.D.   On: 01/10/2024 16:27    Time coordinating discharge: 50 mins  SIGNED:  Carollee Herter, DO [facility] 01/12/24, 11:54 AM

## 2024-01-12 NOTE — Plan of Care (Signed)

## 2024-01-12 NOTE — Progress Notes (Signed)
 AVS reviewed with patient. All questions answered and patient verbalized understating. IV removed per order without complications. Patient is discharged home via vehicle. Patient escorted to main entrance via WC by staff.

## 2024-01-12 NOTE — Progress Notes (Signed)
 PROGRESS NOTE    Caral Whan  WUJ:[id number] DOB: 1961-01-22 DOA: 01/10/2024 PCP: Elie Confer, NP  Subjective: Pt seen and examined.  Pt had normal/solid BM today. No blood in stool. HgB stable at 9.9 g/dl. Discussed oncology and GI. They have both given clearance for DC today.  DC to home to complete anusol suppos for 2 weeks.  Urine cx not back yet. Will treat with 3 additional days of po keflex.   Hospital Course: HPI: Jamica Woodyard is a 63 y.o. female with medical history significant of GERD, HTN, stage IV breast cancer on Verzenio who has been on a stable dose of this since 11/14/2023.  She recently had runny nose, denied any cough but over the last week has felt increasing weakness, and having no energy.  This is new for her.  She reports feeling lightheaded, dizzy, having headache for the last 3 days.  Over the last few nights she has had rigors and cold sweats but denies fever.  Patient also reports bright red bleeding noted on brown stool which is painless.  She has previously had a colonoscopy with [facility] approximately 2 years ago which showed several tubular adenomas and internal hemorrhoids but was otherwise normal.   Significant Events: Admitted 01/10/2024 for lower GI bleed   Significant Labs: Na 137, K 3.6, CO2 of 25, BUN 12, Scr 1.05, glu 91 WBC 4.1, Hg 9.9, Plt 103  Significant Imaging Studies: CT angio GI bleed No evidence of gastrointestinal bleeding. 2. 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 3. 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 Attention on follow-up imaging. 4. Unchanged moderate hiatal hernia  Antibiotic Therapy: Anti-infectives (From admission, onward)    Start     Dose/Rate Route Frequency Ordered Stop   01/10/24 2200  cefTRIAXone (ROCEPHIN) 2 g in sodium chloride 0.9 % 100 mL IVPB        2 g 200 mL/hr over 30 Minutes Intravenous Every 24 hours  01/10/24 2138         Procedures:   Consultants:     Assessment and Plan: * Lower GI bleed On admission. Hemoglobin is drifted down from 11.3 proximately a month ago to 9.9.  She is relatively stable tonight. Trend hemoglobin. Consider GI consult in the morning if needed.  01-11-2024 discussed with Dr. Pamelia Hoit. HgB stable at 10.3 g/dl. Oncology wants pt to be seen by GI consultant. Pt aware that she may need colonoscopy. Pt states she cannot take regular liquid bowel prep and needs tablet laxatives.  01-12-2024 likely due to GI irritation/hemorrhoids from [facility] from recently increased dose. Will hold Verzenio at discharge per oncology. F/u with oncology next week to restart back at lower dose.  Rigors On admission. Patient is on significant chemotherapeutic agents and there is a question of whether she has some underlying infection. She did not mount a white count but might not. Respiratory panel is negative. Urinalysis is questionable for UTI, will send for culture. Begin Rocephin  01-11-2024 continue IV rocephin for now. Awaiting urine cx.  01-12-2024 no further rigors. Urine cx not resulted yet. Will send home with po keflex for 3 days.  Malignant neoplasm of upper-outer quadrant of left breast in female, estrogen receptor positive (HCC) On admission. Continue [place]  01-11-2024 hold Verzenio for now. Can continue femara  01-12-2024 hold verzenio at discharge per oncology. Dr. Pamelia Hoit will restart as outpatient.  Obesity, Class II, BMI 35-39.9 Body mass  index is 35.95 kg/m.   Hyperlipidemia 01-11-2024 Continue atorvastatin  Essential hypertension On admission. Continue carvedilol, HCTZ, lisinopril, Lopressor  01-11-2024 stop hydrochlorothiazide/lisinopril will likely be getting GI bowel prep. Don't want to get her dehydrated.  01-12-2024 continue coreg at discharge. Hold hydrochlorothiazide/ACEI until seen by oncology next week to restart. Pt to stay  hydrated at home.   DVT prophylaxis: Place and maintain sequential compression device Start: 01/11/24 1146    Code Status: Full Code Family Communication: no family at bedside.  Disposition Plan: return home Reason for continuing need for hospitalization: stable for DC today.  Objective: Vitals:   01/11/24 0851 01/11/24 1212 01/11/24 1952 01/12/24 0435  BP: 123/77 107/69 117/76 126/87  Pulse:  67 88 78  Resp:  19 18 18   Temp:  98 F (36.7 C) 98.1 F (36.7 C) 98.2 F (36.8 C)  TempSrc:  Oral Oral Oral  SpO2:  100% 98% 97%  Weight:      Height:       No intake or output data in the 24 hours ending 01/12/24 1152 Filed Weights   01/10/24 1258  Weight: 127 kg    Examination:  Physical Exam Vitals and nursing note reviewed.  Constitutional:      General: She is not in acute distress.    Appearance: She is normal weight. She is not toxic-appearing or diaphoretic.  HENT:     Head: Normocephalic and atraumatic.     Nose: Nose normal.  Cardiovascular:     Rate and Rhythm: Normal rate and regular rhythm.  Pulmonary:     Effort: Pulmonary effort is normal.     Breath sounds: Normal breath sounds.  Abdominal:     General: Bowel sounds are normal. There is no distension.     Palpations: Abdomen is soft.  Musculoskeletal:     Right lower leg: No edema.     Left lower leg: No edema.  Skin:    General: Skin is warm and dry.     Capillary Refill: Capillary refill takes less than 2 seconds.  Neurological:     General: No focal deficit present.     Mental Status: She is alert and oriented to person, place, and time.     Data Reviewed: I have personally reviewed following labs and imaging studies  CBC: Recent Labs  Lab 01/10/24 1532 01/10/24 1633 01/11/24 0512 01/12/24 0541  WBC  --  4.1 3.3* 2.8*  NEUTROABS  --  2.6  --  1.3*  HGB 10.2* 9.9* 10.3* 9.9*  HCT 30.0* 29.8* 32.2* 30.4*  MCV  --  94.3 97.0 96.5  PLT  --  103* 119* 123*   Basic Metabolic  Panel: Recent Labs  Lab 01/10/24 1450 01/10/24 1532 01/11/24 0512 01/12/24 0541  NA 137 136 137 138  K 3.6 4.9 3.6 3.6  CL 103 103 104 103  CO2 25  --  25 27  GLUCOSE 91 85 96 105*  BUN 12 13 10 13   CREATININE 1.05* 1.20* 1.08* 1.13*  CALCIUM 9.2  --  9.3 9.1   GFR: Estimated Creatinine Clearance: 78.4 mL/min (A) (by C-G formula based on SCr of 1.13 mg/dL (H)). Liver Function Tests: Recent Labs  Lab 01/10/24 1450  AST 20  ALT 18  ALKPHOS 53  BILITOT 1.0  PROT 7.2  ALBUMIN 3.5   Thyroid Function Tests: Recent Labs    01/10/24 1939  TSH 2.017   Sepsis Labs: Recent Labs  Lab 01/10/24 1939  LATICACIDVEN 1.3  Recent Results (from the past 240 hours)  Resp panel by RT-PCR (RSV, Flu A&B, Covid) Anterior Nasal Swab     Status: None   Collection Time: 01/10/24  5:35 PM   Specimen: Anterior Nasal Swab  Result Value Ref Range Status   SARS Coronavirus 2 by RT PCR NEGATIVE NEGATIVE Final    Comment: (NOTE) SARS-CoV-2 target nucleic acids are NOT DETECTED.  The SARS-CoV-2 RNA is generally detectable in upper respiratory specimens during the acute phase of infection. The lowest concentration of SARS-CoV-2 viral copies this assay can detect is 138 copies/mL. A negative result does not preclude SARS-Cov-2 infection and should not be used as the sole basis for treatment or other patient management decisions. A negative result may occur with  improper specimen collection/handling, submission of specimen other than nasopharyngeal swab, presence of viral mutation(s) within the areas targeted by this assay, and inadequate number of viral copies(<138 copies/mL). A negative result must be combined with clinical observations, patient history, and epidemiological information. The expected result is Negative.  Fact Sheet for Patients:  BloggerCourse.com  Fact Sheet for Healthcare Providers:  SeriousBroker.it  This test is  no t yet approved or cleared by the [place] FDA and  has been authorized for detection and/or diagnosis of SARS-CoV-2 by FDA under an Emergency Use Authorization (EUA). This EUA will remain  in effect (meaning this test can be used) for the duration of the COVID-19 declaration under Section 564(b)(1) of the Act, 21 U.S.C.section 360bbb-3(b)(1), unless the authorization is terminated  or revoked sooner.       Influenza A by PCR NEGATIVE NEGATIVE Final   Influenza B by PCR NEGATIVE NEGATIVE Final    Comment: (NOTE) The Xpert Xpress SARS-CoV-2/FLU/RSV plus assay is intended as an aid in the diagnosis of influenza from Nasopharyngeal swab specimens and should not be used as a sole basis for treatment. Nasal washings and aspirates are unacceptable for Xpert Xpress SARS-CoV-2/FLU/RSV testing.  Fact Sheet for Patients: BloggerCourse.com  Fact Sheet for Healthcare Providers: SeriousBroker.it  This test is not yet approved or cleared by the [place] FDA and has been authorized for detection and/or diagnosis of SARS-CoV-2 by FDA under an Emergency Use Authorization (EUA). This EUA will remain in effect (meaning this test can be used) for the duration of the COVID-19 declaration under Section 564(b)(1) of the Act, 21 U.S.C. section 360bbb-3(b)(1), unless the authorization is terminated or revoked.     Resp Syncytial Virus by PCR NEGATIVE NEGATIVE Final    Comment: (NOTE) Fact Sheet for Patients: BloggerCourse.com  Fact Sheet for Healthcare Providers: SeriousBroker.it  This test is not yet approved or cleared by the [place] FDA and has been authorized for detection and/or diagnosis of SARS-CoV-2 by FDA under an Emergency Use Authorization (EUA). This EUA will remain in effect (meaning this test can be used) for the duration of the COVID-19 declaration under Section  564(b)(1) of the Act, 21 U.S.C. section 360bbb-3(b)(1), unless the authorization is terminated or revoked.  Performed at [facility], [address], [place], [place] [address]   Blood culture (routine x 2)     Status: None (Preliminary result)   Collection Time: 01/10/24  5:36 PM   Specimen: BLOOD  Result Value Ref Range Status   Specimen Description   Final    BLOOD RIGHT ANTECUBITAL Performed at [facility], [address], [place], [place] [address]    Special Requests   Final    BOTTLES DRAWN AEROBIC  AND ANAEROBIC Blood Culture results may not be optimal due to an inadequate volume of blood received in culture bottles Performed at [facility], [address] [place], [place] [address]    Culture   Final    NO GROWTH 2 DAYS Performed at [facility], [address] [place], [place] [address]    Report Status PENDING  Incomplete  Blood culture (routine x 2)     Status: None (Preliminary result)   Collection Time: 01/10/24  7:39 PM   Specimen: BLOOD  Result Value Ref Range Status   Specimen Description   Final    BLOOD BLOOD RIGHT HAND Performed at [facility], [address] [place], [place] [address]    Special Requests   Final    BOTTLES DRAWN AEROBIC ONLY Blood Culture results may not be optimal due to an inadequate volume of blood received in culture bottles Performed at [facility], [address] [place], [place] [address]    Culture   Final    NO GROWTH 2 DAYS Performed at [facility], [address] [place], [place] [address]    Report Status PENDING  Incomplete     Radiology Studies: CT ANGIO GI BLEED Result Date: 01/10/2024 CLINICAL DATA:  Lower abdominal discomfort. Hematochezia for the past week. History of breast cancer. EXAM: CTA ABDOMEN AND PELVIS WITHOUT AND WITH CONTRAST TECHNIQUE: Multidetector CT imaging of the abdomen and pelvis was  performed using the standard protocol during bolus administration of intravenous contrast. Multiplanar reconstructed images and MIPs were obtained and reviewed to evaluate the vascular anatomy. RADIATION DOSE REDUCTION: This exam was performed according to the departmental dose-optimization program which includes automated exposure control, adjustment of the mA and/or kV according to patient size and/or use of iterative reconstruction technique. CONTRAST:  OMNIPAQUE IOHEXOL 350 MG/ML SOLN COMPARISON:  CT chest, abdomen, and pelvis dated October 27, 2023. FINDINGS: VASCULAR Aorta: Normal caliber aorta without aneurysm, dissection, vasculitis or significant stenosis. Celiac: Patent. 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 Slight poststenotic dilatation of the downstream celiac artery. No aneurysm, dissection, or vasculitis. SMA: Patent without evidence of aneurysm, dissection, vasculitis or significant stenosis. Renals: Two right and single left renal arteries are patent without evidence of aneurysm, dissection, vasculitis, fibromuscular dysplasia or significant stenosis. IMA: Patent without evidence of aneurysm, dissection, vasculitis or significant stenosis. Inflow: Patent without evidence of aneurysm, dissection, vasculitis or significant stenosis. Proximal Outflow: Bilateral common femoral and visualized portions of the superficial and profunda femoral arteries are patent without evidence of aneurysm, dissection, vasculitis or significant stenosis. Veins: Unremarkable. Review of the MIP images confirms the above findings. NON-VASCULAR Lower chest: Small right lower lobe nodules are unchanged. No acute abnormality. Hepatobiliary: No focal liver abnormality is seen. No gallstones, gallbladder wall thickening, or biliary dilatation. Pancreas: Similar fatty atrophy of the pancreas. No ductal dilatation or surrounding inflammatory change. Spleen: Normal in size  without focal abnormality. Adrenals/Urinary Tract: Adrenal glands are unremarkable. Kidneys are normal, without renal calculi, focal lesion, or hydronephrosis. Bladder is unremarkable. Stomach/Bowel: Unchangedmoderate hiatal hernia. The stomach is otherwise within normal limits. No intraluminal contrast extravasation or pooling to suggest gastrointestinal bleeding. Small amount of high-density material in the cecum and proximal descending colon on noncontrast images. No bowel wall thickening, distention, or surrounding inflammatory changes. Normal diminutive appendix. Lymphatic: No enlarged abdominal or pelvic lymph nodes. Reproductive: Status post hysterectomy. No adnexal masses. Other: Trace  free fluid in the pelvis, nonspecific. No pneumoperitoneum. Musculoskeletal: No acute or significant osseous findings. IMPRESSION: 1. No evidence of gastrointestinal bleeding. 2. 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 3. 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 Attention on follow-up imaging. 4. Unchanged moderate hiatal hernia. Electronically Signed   By: Obie Dredge M.D.   On: 01/10/2024 16:27    Scheduled Meds:  atorvastatin  10 mg Oral Daily   carvedilol  6.25 mg Oral BID WC   diclofenac Sodium  4 g Topical QID   hydrocortisone  25 mg Rectal QHS   letrozole  2.5 mg Oral Daily   pantoprazole  40 mg Oral Daily   thiamine  100 mg Oral Daily   Vitamin D (Ergocalciferol)  50,000 Units Oral Q7 days   Continuous Infusions:  cefTRIAXone (ROCEPHIN)  IV 2 g (01/11/24 2119)     LOS: 0 days   Time spent: 40 minutes  Carollee Herter, DO  [facility]  01/12/2024, 11:52 AM

## 2024-01-12 NOTE — Progress Notes (Addendum)
 [facility] Progress Note  CC:   Rectal bleeding   Subjective: She stated feeling great today. She passed a normal brown stool this morning. No rectal bleeding. No abdominal pain. She wishes to go home today.    Objective:  Vital signs in last 24 hours: Temp:  [98 F (36.7 C)-98.2 F (36.8 C)] 98.2 F (36.8 C) (04/01 0435) Pulse Rate:  [67-88] 78 (04/01 0435) Resp:  [18-19] 18 (04/01 0435) BP: (107-126)/(69-87) 126/87 (04/01 0435) SpO2:  [97 %-100 %] 97 % (04/01 0435) Last BM Date : 01/10/24 General: Alert, well-developed in NAD Heart: RRR, no murmurs.  Pulm: Breath  sounds clear throughout.  Abdomen: Soft, nontender. Positive bowel sounds x 4 quadrants.  Extremities:  No edema. Neurologic:  Alert and  oriented x 4. Grossly normal neurologically. Psych:  Alert and cooperative. Normal mood and affect.  Intake/Output from previous day: 03/31 0701 - 04/01 0700 In: 340 [P.O.:240; IV Piggyback:100] Out: -  Intake/Output this shift: No intake/output data recorded.  Lab Results: Recent Labs    01/10/24 1633 01/11/24 0512 01/12/24 0541  WBC 4.1 3.3* 2.8*  HGB 9.9* 10.3* 9.9*  HCT 29.8* 32.2* 30.4*  PLT 103* 119* 123*   BMET Recent Labs    01/10/24 1450 01/10/24 1532 01/11/24 0512 01/12/24 0541  NA 137 136 137 138  K 3.6 4.9 3.6 3.6  CL 103 103 104 103  CO2 25  --  25 27  GLUCOSE 91 85 96 105*  BUN 12 13 10 13   CREATININE 1.05* 1.20* 1.08* 1.13*  CALCIUM 9.2  --  9.3 9.1   LFT Recent Labs    01/10/24 1450  PROT 7.2  ALBUMIN 3.5  AST 20  ALT 18  ALKPHOS 53  BILITOT 1.0   PT/INR No results for input(s): "LABPROT", "INR" in the last 72 hours. Hepatitis Panel No results for input(s): "HEPBSAG", "HCVAB", "HEPAIGM", "HEPBIGM" in the last 72 hours.  CT ANGIO GI BLEED Result Date: 01/10/2024 CLINICAL DATA:  Lower abdominal discomfort. Hematochezia for the past week. History of breast cancer. EXAM: CTA ABDOMEN AND PELVIS WITHOUT AND WITH  CONTRAST TECHNIQUE: Multidetector CT imaging of the abdomen and pelvis was performed using the standard protocol during bolus administration of intravenous contrast. Multiplanar reconstructed images and MIPs were obtained and reviewed to evaluate the vascular anatomy. RADIATION DOSE REDUCTION: This exam was performed according to the departmental dose-optimization program which includes automated exposure control, adjustment of the mA and/or kV according to patient size and/or use of iterative reconstruction technique. CONTRAST:  OMNIPAQUE IOHEXOL 350 MG/ML SOLN COMPARISON:  CT chest, abdomen, and pelvis dated October 27, 2023. FINDINGS: VASCULAR Aorta: Normal caliber aorta without aneurysm, dissection, vasculitis or significant stenosis. Celiac: Patent. 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 Slight poststenotic dilatation of the downstream celiac artery. No aneurysm, dissection, or vasculitis. SMA: Patent without evidence of aneurysm, dissection, vasculitis or significant stenosis. Renals: Two right and single left renal arteries are patent without evidence of aneurysm, dissection, vasculitis, fibromuscular dysplasia or significant stenosis. IMA: Patent without evidence of aneurysm, dissection, vasculitis or significant stenosis. Inflow: Patent without evidence of aneurysm, dissection, vasculitis or significant stenosis. Proximal Outflow: Bilateral common femoral and visualized portions of the superficial and profunda femoral arteries are patent without evidence of aneurysm, dissection, vasculitis or significant stenosis. Veins: Unremarkable. Review of the MIP images confirms the above findings. NON-VASCULAR Lower chest: Small right lower lobe nodules are unchanged. No acute abnormality.  Hepatobiliary: No focal liver abnormality is seen. No gallstones, gallbladder wall thickening, or biliary dilatation. Pancreas: Similar fatty atrophy of the pancreas. No  ductal dilatation or surrounding inflammatory change. Spleen: Normal in size without focal abnormality. Adrenals/Urinary Tract: Adrenal glands are unremarkable. Kidneys are normal, without renal calculi, focal lesion, or hydronephrosis. Bladder is unremarkable. Stomach/Bowel: Unchangedmoderate hiatal hernia. The stomach is otherwise within normal limits. No intraluminal contrast extravasation or pooling to suggest gastrointestinal bleeding. Small amount of high-density material in the cecum and proximal descending colon on noncontrast images. No bowel wall thickening, distention, or surrounding inflammatory changes. Normal diminutive appendix. Lymphatic: No enlarged abdominal or pelvic lymph nodes. Reproductive: Status post hysterectomy. No adnexal masses. Other: Trace free fluid in the pelvis, nonspecific. No pneumoperitoneum. Musculoskeletal: No acute or significant osseous findings. IMPRESSION: 1. No evidence of gastrointestinal bleeding. 2. 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 3. 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 Attention on follow-up imaging. 4. Unchanged moderate hiatal hernia. Electronically Signed   By: Obie Dredge M.D.   On: 01/10/2024 16:27    Assessment / Plan:  63 year old female with metastatic breast cancer admitted 01/10/2024 which rectal bleeding and lower abdominal pressure/tightness discomfort. Chronic loose stools since starting Verzenio, with increased frequency and urgency since Verzenio dose was increased 11/2023. Suspect rectal bleeding, likely from small internal hemorrhoids. CTA  was negative for active GI bleed. Rectal exam 3/31 showed brown solid stool. Hg 10.3 -> today Hg 9.9.  She passed a brown solid stool this morning. Hemodynamically stable.  -Ok to discharge home today from GI standpoint  -Anusol HC suppository 1 PR Q HS x 2 weeks  -Outpatient GI follow up as needed, if rectal bleeding recurs   -Continue follow up with oncology, seen by Dr. Pamelia Hoit 3/31, planning on restarting Verzenio at a lower dose at time of discharge   History of colon polyps. Colonoscopy 01/28/2022 identified 4 polyps removed from the colon, path report consistent with 2 tubular adenomas, 1 fibroblastic polyp and 1 benign colon mucosa polyp.  -Next colonoscopy due 01/2027   History of GERD, hiatal hernia -Pantoprazole 40 mg daily  Neutropenia, likely secondary to Verzenio targeted therapy for breast cancer. WBC 3.3 -> 2.8.  -Continue follow up with oncology   Principal Problem:   Lower GI bleed Active Problems:   Essential hypertension   Malignant neoplasm of upper-outer quadrant of left breast in female, estrogen receptor positive (HCC)   Hyperlipidemia   Rigors   Obesity, Class II, BMI 35-39.9   Hematochezia   Internal hemorrhoids   Anemia of chronic disease     LOS: 0 days   Jill Side M Kennedy-Smith  01/12/2024, 11:11 AM  GI ATTENDING  Interval history data reviewed.  No further bleeding.  Again, suspected to be internal hemorrhoids.  Recommend 2-week course of outpatient hemorrhoidal rectal suppositories.  No specific GI follow-up required unless the patient has significant recurrent problems.  Okay for discharge from GI standpoint.  Wilhemina Bonito. Eda Keys., M.D. [facility] Division of Gastroenterology

## 2024-01-15 LAB — CULTURE, BLOOD (ROUTINE X 2)
Culture: NO GROWTH
Culture: NO GROWTH

## 2024-01-19 ENCOUNTER — Inpatient Hospital Stay: Attending: Hematology and Oncology

## 2024-01-19 ENCOUNTER — Inpatient Hospital Stay (HOSPITAL_BASED_OUTPATIENT_CLINIC_OR_DEPARTMENT_OTHER): Admitting: Hematology and Oncology

## 2024-01-19 DIAGNOSIS — Z79899 Other long term (current) drug therapy: Secondary | ICD-10-CM | POA: Insufficient documentation

## 2024-01-19 DIAGNOSIS — Z1721 Progesterone receptor positive status: Secondary | ICD-10-CM | POA: Diagnosis not present

## 2024-01-19 DIAGNOSIS — Z79811 Long term (current) use of aromatase inhibitors: Secondary | ICD-10-CM | POA: Diagnosis not present

## 2024-01-19 DIAGNOSIS — R918 Other nonspecific abnormal finding of lung field: Secondary | ICD-10-CM | POA: Diagnosis not present

## 2024-01-19 DIAGNOSIS — Z170 Estrogen receptor positive status [ER+]: Secondary | ICD-10-CM

## 2024-01-19 DIAGNOSIS — C773 Secondary and unspecified malignant neoplasm of axilla and upper limb lymph nodes: Secondary | ICD-10-CM | POA: Insufficient documentation

## 2024-01-19 DIAGNOSIS — C50412 Malignant neoplasm of upper-outer quadrant of left female breast: Secondary | ICD-10-CM

## 2024-01-19 DIAGNOSIS — Z923 Personal history of irradiation: Secondary | ICD-10-CM | POA: Insufficient documentation

## 2024-01-19 DIAGNOSIS — Z9012 Acquired absence of left breast and nipple: Secondary | ICD-10-CM | POA: Diagnosis not present

## 2024-01-19 DIAGNOSIS — Z1732 Human epidermal growth factor receptor 2 negative status: Secondary | ICD-10-CM | POA: Diagnosis not present

## 2024-01-19 DIAGNOSIS — R197 Diarrhea, unspecified: Secondary | ICD-10-CM | POA: Insufficient documentation

## 2024-01-19 LAB — CBC WITH DIFFERENTIAL (CANCER CENTER ONLY)
Abs Immature Granulocytes: 0.02 10*3/uL (ref 0.00–0.07)
Basophils Absolute: 0.1 10*3/uL (ref 0.0–0.1)
Basophils Relative: 1 %
Eosinophils Absolute: 0.1 10*3/uL (ref 0.0–0.5)
Eosinophils Relative: 2 %
HCT: 31 % — ABNORMAL LOW (ref 36.0–46.0)
Hemoglobin: 10.5 g/dL — ABNORMAL LOW (ref 12.0–15.0)
Immature Granulocytes: 1 %
Lymphocytes Relative: 37 %
Lymphs Abs: 1.5 10*3/uL (ref 0.7–4.0)
MCH: 31.1 pg (ref 26.0–34.0)
MCHC: 33.9 g/dL (ref 30.0–36.0)
MCV: 91.7 fL (ref 80.0–100.0)
Monocytes Absolute: 0.4 10*3/uL (ref 0.1–1.0)
Monocytes Relative: 10 %
Neutro Abs: 2.1 10*3/uL (ref 1.7–7.7)
Neutrophils Relative %: 49 %
Platelet Count: 217 10*3/uL (ref 150–400)
RBC: 3.38 MIL/uL — ABNORMAL LOW (ref 3.87–5.11)
RDW: 12.9 % (ref 11.5–15.5)
WBC Count: 4.2 10*3/uL (ref 4.0–10.5)
nRBC: 0 % (ref 0.0–0.2)

## 2024-01-19 LAB — CMP (CANCER CENTER ONLY)
ALT: 16 U/L (ref 0–44)
AST: 17 U/L (ref 15–41)
Albumin: 4.1 g/dL (ref 3.5–5.0)
Alkaline Phosphatase: 69 U/L (ref 38–126)
Anion gap: 4 — ABNORMAL LOW (ref 5–15)
BUN: 17 mg/dL (ref 8–23)
CO2: 29 mmol/L (ref 22–32)
Calcium: 9.4 mg/dL (ref 8.9–10.3)
Chloride: 107 mmol/L (ref 98–111)
Creatinine: 1.05 mg/dL — ABNORMAL HIGH (ref 0.44–1.00)
GFR, Estimated: 60 mL/min — ABNORMAL LOW (ref >60)
Glucose, Bld: 82 mg/dL (ref 70–99)
Potassium: 3.8 mmol/L (ref 3.5–5.1)
Sodium: 140 mmol/L (ref 135–145)
Total Bilirubin: 0.3 mg/dL (ref 0.0–1.2)
Total Protein: 7.5 g/dL (ref 6.5–8.1)

## 2024-01-19 LAB — SAMPLE TO BLOOD BANK

## 2024-01-19 MED ORDER — ABEMACICLIB 100 MG PO TABS: 100.0000 mg | ORAL_TABLET | Freq: Two times a day (BID) | ORAL | Status: DC

## 2024-01-19 MED ORDER — SUTAB 1479-225-188 MG PO TABS: 24.0000 | ORAL_TABLET | Freq: Once | ORAL | 0 refills | Status: AC

## 2024-01-19 MED ORDER — DIPHENOXYLATE-ATROPINE 2.5-0.025 MG PO TABS: 1.0000 | ORAL_TABLET | Freq: Four times a day (QID) | ORAL | 1 refills | Status: AC | PRN

## 2024-01-19 NOTE — Assessment & Plan Note (Signed)
 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  AX L&D 03/11/2019: 3/14 lymph nodes positive with focal extranodal involvement [id number]   Treatment plan: 1.  Neoadjuvant chemotherapy with dose dense Adriamycin and Cytoxan every 2 weeks x4 followed by Taxol weekly x 3 discontinued due to severe peripheral neuropathy completed 01/20/2019 2.  Followed by mastectomy on 02/22/2019 3.  Followed by radiation 04/20/2019-06/02/2019 4.  Followed by antiestrogen therapy. 06/02/2019 5. 07/15/22: Lung nodules on CT scan. 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 PET CT 07/31/23: 3 small Rt pleural based nodules hypermetabolic, hypermet Rt hilar LN ----------------------------------------------------------------------------------- Current treatment: Letrozole with Verzenio started 08/14/2023 Guardant360 09/16/2023: No ESR 1 mutation.  MSI high not detected   Verzinio toxicities: Mild headaches Mild nausea Abdominal cramps: No diarrhea Slight leukopenia Mild thrombocytopenia Hospitalization 01/10/2024-01/12/2024: Diarrhea, dehydration, dizziness, bright red blood per rectum  Recommendation: Reduce the dosage of Verzinio to 100 mg p.o. twice daily Issues regarding coverage of Verzinio: Patient has to spend $5000 every month to cover the cost of Verzinio.  Unfortunately after multiple attempts at finding a solution to this problem, there does not appear to be a path for her to continue.  For this month we will provide her with free sample.  Plan to obtain CT scans and follow-up after that.   10/27/2023: CT CAP: Multiple small right sided lung nodules diminished in size and some stable.

## 2024-01-19 NOTE — Progress Notes (Signed)
 Patient Care Team: Elie Confer, NP as PCP - General Yates Decamp, MD as PCP - Cardiology (Cardiology) Harriette Bouillon, MD as Consulting Physician (General Surgery) Serena Croissant, MD as Consulting Physician (Hematology and Oncology) Dorothy Puffer, MD as Consulting Physician (Radiation Oncology)  DIAGNOSIS:  Encounter Diagnosis  Name Primary?   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, lymph node biopsy positive, intramammary lymph node biopsy negative, ER 100%, PR 100%, Ki-67 20%, HER-2 1+ by IHC, T2 and 1 stage 2A   10/27/2018 Cancer Staging   Staging form: Breast, AJCC 8th Edition - Clinical: Stage IIA (cT2, cN1(f), cM0, G1, ER+, PR+, HER2-) - Signed by Serena Croissant, MD on 11/01/2018   11/11/2018 - 12/20/2018 Neo-Adjuvant Chemotherapy   Neoadjuvant chemotherapy with dose dense Adriamycin and Cytoxan every 2 weeks x4 followed by Taxol weekly x12 (stopped after cycle 3 due to severe neuropathy)   02/01/2019 Breast MRI   Known malignancy in left breast measures 4.7 x 3.5 x 7.2cm, previously 5 x 3 x 10cm.    02/22/2019 Surgery   Left lumpectomy ([facility]): IDC with DCIS, 5.8cm, HER2 negative, ER 90%, PR 90%, Ki67 2%, clear margins,3/5 LN positive for malignancy.    03/02/2019 Cancer Staging   Staging form: Breast, AJCC 8th Edition - Pathologic stage from 03/02/2019: No Stage Recommended (ypT3, pN1a, cM0, G2, ER+, PR+, HER2-) - Signed by Serena Croissant, MD on 03/02/2019   03/11/2019 Surgery   Left axillary lymph node dissection  ([facility]): metastatic carcinoma in 3/14 lymph nodes, focal extranodal involvement by tumor.   04/20/2019 - 06/02/2019 Radiation Therapy   Adjuvant radiation therapy   05/30/2019 -  Anti-estrogen oral therapy   Anastrozole 1 mg daily     CHIEF COMPLIANT:   HISTORY OF PRESENT ILLNESS: Discussed the use of AI scribe software for clinical note transcription with the patient, who gave verbal consent to proceed.  History of Present Illness Miss [name], a patient with a history of cancer, presents with concerns about her medication, Verzenio, due to insurance coverage issues. She reports having been off her medication and experiencing side effects such as diarrhea and bleeding. The patient also expresses frustration with the financial burden of her medication costs. She has been actively trying to meet her insurance deductible to reduce her out-of-pocket expenses. The patient also mentions a small nodule in her lung, which has been stable. She has been managing her condition with the help of her husband and has been in communication with her insurance company to resolve the issues.     ALLERGIES:  has no known allergies.  MEDICATIONS:  Current Outpatient Medications  Medication Sig Dispense Refill   atorvastatin (LIPITOR) 10 MG tablet Take 1 tablet by mouth daily.     carvedilol (COREG) 6.25 MG tablet TAKE 1 TABLET BY MOUTH TWICE DAILY (Patient taking differently: Take 6.25 mg by mouth 2 (two) times daily with a meal.) 180 tablet 1   diclofenac Sodium (VOLTAREN) 1 % GEL Apply 4 g topically 4 (four) times daily. 100 g 4   hydrocortisone (ANUSOL-HC) 25 MG suppository Place 1 suppository (25  mg total) rectally at bedtime for 12 days. 12 suppository 0   letrozole (FEMARA) 2.5 MG tablet Take 1 tablet (2.5 mg total) by mouth daily. 90 tablet 3   [Paused] lisinopril-hydrochlorothiazide (ZESTORETIC) 10-12.5 MG tablet Take 1 tablet by mouth daily.     naproxen (NAPROSYN) 500 MG tablet Take 500 mg by  mouth daily as needed for moderate pain (pain score 4-6).     ondansetron (ZOFRAN) 4 MG tablet TAKE 1 TABLET BY MOUTH EVERY 8 HOURS AS NEEDED FOR NAUSEA AND VOMITING 18 tablet 1   pantoprazole (PROTONIX) 40 MG tablet TAKE 1 TABLET BY MOUTH EVERY DAY 90 tablet 1   [Paused] VERZENIO 150 MG tablet TAKE 1 TABLET BY MOUTH 2 TIMES A DAY 56 tablet 1   Vitamin D, Ergocalciferol, (DRISDOL) 1.25 MG (50000 UNIT) CAPS capsule TAKE 1 CAPSULE (50,000 UNITS TOTAL) BY MOUTH EVERY 7 (SEVEN) DAYS 12 capsule 1   No current facility-administered medications for this visit.   Facility-Administered Medications Ordered in Other Visits  Medication Dose Route Frequency Provider Last Rate Last Admin   gadopentetate dimeglumine (MAGNEVIST) injection 20 mL  20 mL Intravenous Once PRN Anson Fret, MD       gadopentetate dimeglumine (MAGNEVIST) injection 20 mL  20 mL Intravenous Once PRN Anson Fret, MD        PHYSICAL EXAMINATION: ECOG PERFORMANCE STATUS: 1 - Symptomatic but completely ambulatory  There were no vitals filed for this visit. There were no vitals filed for this visit.  Physical Exam   (exam performed in the presence of a chaperone)  LABORATORY DATA:  I have reviewed the data as listed    Latest Ref Rng & Units 01/12/2024    5:41 AM 01/11/2024    5:12 AM 01/10/2024    3:32 PM  CMP  Glucose 70 - 99 mg/dL 161  96  85   BUN 8 - 23 mg/dL 13  10  13    Creatinine 0.44 - 1.00 mg/dL 0.96  0.45  4.09   Sodium 135 - 145 mmol/L 138  137  136   Potassium 3.5 - 5.1 mmol/L 3.6  3.6  4.9   Chloride 98 - 111 mmol/L 103  104  103   CO2 22 - 32 mmol/L 27  25    Calcium 8.9 - 10.3 mg/dL 9.1  9.3      Lab Results  Component Value Date   WBC 4.2 01/19/2024   HGB 10.5 (L) 01/19/2024   HCT 31.0 (L) 01/19/2024   MCV 91.7 01/19/2024   PLT 217 01/19/2024   NEUTROABS 2.1 01/19/2024    ASSESSMENT & PLAN:  Malignant neoplasm of upper-outer quadrant of left breast in female, estrogen receptor positive  (HCC) Left lumpectomy: 02/22/2019:([facility]): IDC with DCIS, 5.8cm, margins negative, lymphovascular invasion present HER2 negative, ER 90%, PR 90%, Ki67 2%, clear margins,3/5 LN positive for malignancy.  [facility] 03/11/2019: 3/14 lymph nodes positive with focal extranodal involvement [id number]   Treatment plan: 1.  Neoadjuvant chemotherapy with dose dense Adriamycin and Cytoxan every 2 weeks x4 followed by Taxol weekly x 3 discontinued due to severe peripheral neuropathy completed 01/20/2019 2.  Followed by mastectomy on 02/22/2019 3.  Followed by radiation 04/20/2019-06/02/2019 4.  Followed by antiestrogen therapy. 06/02/2019 5. 07/15/22: Lung nodules on CT scan. 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 PET CT 07/31/23: 3 small Rt pleural based nodules hypermetabolic, hypermet Rt hilar LN ----------------------------------------------------------------------------------- Current treatment: Letrozole with Verzenio started 08/14/2023  Guardant360 09/16/2023: No ESR 1 mutation.  MSI high not detected   Verzinio toxicities: Mild headaches Mild nausea Abdominal cramps: No diarrhea Slight leukopenia Mild thrombocytopenia Hospitalization 01/10/2024-01/12/2024: Diarrhea, dehydration, dizziness, bright red blood per rectum  Recommendation: Reduce the dosage of Verzinio to 100 mg p.o. twice daily Issues regarding coverage of Verzinio: Patient has to spend $5000 every month to cover the cost of Verzinio. For this month we will provide her with free sample.  Plan to obtain CT scans in July and follow-up after   10/27/2023: CT CAP: Multiple small right sided lung nodules diminished in size and some stable. 01/10/2024: CT abdomen: Bottom of the lung showed stable lung nodule.  Therefore we decided to do scans in July Return to clinic in 1 month for labs and follow-up.  If she met her deductible then she would be able to get the medication through her prescription benefits. Assessment & Plan Metastatic  Lung Cancer Lung nodule stable, no new lesions or bleeding on recent CT. Verzenio effective. - Continue Verzenio (abemaciclib) treatment. - Schedule follow-up CT scan in July.  Diarrhea Diarrhea subsided but risk of recurrence with Farxiga resumption. - Prescribe stronger anti-diarrheal medication.  Insurance and Medication Access Issues $77.45 away from deductible, affecting medication access. - Prescribe medications to meet deductible, including stronger anti-diarrheal and Sutab. - Advise insurance check in 2-3 weeks.  Follow-up CT scan stable, blood counts improved. - Schedule follow-up in one month for labs and treatment evaluation. - Plan CT scan in July.      No orders of the defined types were placed in this encounter.  The patient has a good understanding of the overall plan. she agrees with it. she will call with any problems that may develop before the next visit here. Total time spent: 30 mins including face to face time and time spent for planning, charting and co-ordination of care   Tamsen Meek, MD 01/19/24

## 2024-01-20 ENCOUNTER — Telehealth: Payer: Self-pay | Admitting: Hematology and Oncology

## 2024-01-20 LAB — CANCER ANTIGEN 27.29: CA 27.29: 52.2 U/mL — ABNORMAL HIGH (ref 0.0–38.6)

## 2024-01-20 NOTE — Telephone Encounter (Signed)
 Left vm for pt about scheduled appt time and date.

## 2024-01-22 ENCOUNTER — Encounter (INDEPENDENT_AMBULATORY_CARE_PROVIDER_SITE_OTHER): Payer: Self-pay | Admitting: Otolaryngology

## 2024-02-04 ENCOUNTER — Encounter: Payer: Self-pay | Admitting: Radiology

## 2024-02-08 ENCOUNTER — Encounter: Payer: Self-pay | Admitting: Physician Assistant

## 2024-02-15 ENCOUNTER — Telehealth: Payer: Self-pay

## 2024-02-15 NOTE — Telephone Encounter (Signed)
 Pt called and LVM stating she needs a letter for work extending her absence, asking about upcoming scans and appts. Attempted to call pt back. LVM for call back.

## 2024-02-17 ENCOUNTER — Inpatient Hospital Stay: Attending: Hematology and Oncology

## 2024-02-17 ENCOUNTER — Encounter: Payer: Self-pay | Admitting: *Deleted

## 2024-02-17 ENCOUNTER — Inpatient Hospital Stay (HOSPITAL_BASED_OUTPATIENT_CLINIC_OR_DEPARTMENT_OTHER): Admitting: Hematology and Oncology

## 2024-02-17 VITALS — BP 124/71 | HR 77 | Temp 97.7°F | Resp 18 | Wt 281.7 lb

## 2024-02-17 DIAGNOSIS — Z170 Estrogen receptor positive status [ER+]: Secondary | ICD-10-CM

## 2024-02-17 DIAGNOSIS — Z1721 Progesterone receptor positive status: Secondary | ICD-10-CM | POA: Insufficient documentation

## 2024-02-17 DIAGNOSIS — Z79811 Long term (current) use of aromatase inhibitors: Secondary | ICD-10-CM | POA: Diagnosis not present

## 2024-02-17 DIAGNOSIS — Z1732 Human epidermal growth factor receptor 2 negative status: Secondary | ICD-10-CM | POA: Insufficient documentation

## 2024-02-17 DIAGNOSIS — N189 Chronic kidney disease, unspecified: Secondary | ICD-10-CM | POA: Diagnosis not present

## 2024-02-17 DIAGNOSIS — Z79899 Other long term (current) drug therapy: Secondary | ICD-10-CM | POA: Diagnosis not present

## 2024-02-17 DIAGNOSIS — C50412 Malignant neoplasm of upper-outer quadrant of left female breast: Secondary | ICD-10-CM | POA: Insufficient documentation

## 2024-02-17 DIAGNOSIS — R197 Diarrhea, unspecified: Secondary | ICD-10-CM | POA: Diagnosis not present

## 2024-02-17 LAB — CMP (CANCER CENTER ONLY)
ALT: 12 U/L (ref 0–44)
AST: 15 U/L (ref 15–41)
Albumin: 4.1 g/dL (ref 3.5–5.0)
Alkaline Phosphatase: 71 U/L (ref 38–126)
Anion gap: 5 (ref 5–15)
BUN: 20 mg/dL (ref 8–23)
CO2: 29 mmol/L (ref 22–32)
Calcium: 9.3 mg/dL (ref 8.9–10.3)
Chloride: 105 mmol/L (ref 98–111)
Creatinine: 1.37 mg/dL — ABNORMAL HIGH (ref 0.44–1.00)
GFR, Estimated: 43 mL/min — ABNORMAL LOW (ref >60)
Glucose, Bld: 85 mg/dL (ref 70–99)
Potassium: 4 mmol/L (ref 3.5–5.1)
Sodium: 139 mmol/L (ref 135–145)
Total Bilirubin: 0.5 mg/dL (ref 0.0–1.2)
Total Protein: 7.4 g/dL (ref 6.5–8.1)

## 2024-02-17 LAB — CBC WITH DIFFERENTIAL (CANCER CENTER ONLY)
Abs Immature Granulocytes: 0.01 10*3/uL (ref 0.00–0.07)
Basophils Absolute: 0 10*3/uL (ref 0.0–0.1)
Basophils Relative: 1 %
Eosinophils Absolute: 0.1 10*3/uL (ref 0.0–0.5)
Eosinophils Relative: 2 %
HCT: 33.3 % — ABNORMAL LOW (ref 36.0–46.0)
Hemoglobin: 11.2 g/dL — ABNORMAL LOW (ref 12.0–15.0)
Immature Granulocytes: 0 %
Lymphocytes Relative: 38 %
Lymphs Abs: 1.1 10*3/uL (ref 0.7–4.0)
MCH: 31.5 pg (ref 26.0–34.0)
MCHC: 33.6 g/dL (ref 30.0–36.0)
MCV: 93.8 fL (ref 80.0–100.0)
Monocytes Absolute: 0.2 10*3/uL (ref 0.1–1.0)
Monocytes Relative: 8 %
Neutro Abs: 1.4 10*3/uL — ABNORMAL LOW (ref 1.7–7.7)
Neutrophils Relative %: 51 %
Platelet Count: 123 10*3/uL — ABNORMAL LOW (ref 150–400)
RBC: 3.55 MIL/uL — ABNORMAL LOW (ref 3.87–5.11)
RDW: 12.4 % (ref 11.5–15.5)
WBC Count: 2.9 10*3/uL — ABNORMAL LOW (ref 4.0–10.5)
nRBC: 0 % (ref 0.0–0.2)

## 2024-02-17 NOTE — Assessment & Plan Note (Signed)
 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  [facility] 03/11/2019: 3/14 lymph nodes positive with focal extranodal involvement [id number]   Treatment plan: 1.  Neoadjuvant chemotherapy with dose dense Adriamycin  and Cytoxan  every 2 weeks x4 followed by Taxol  weekly x 3 discontinued due to severe peripheral neuropathy completed 01/20/2019 2.  Followed by mastectomy on 02/22/2019 3.  Followed by radiation 04/20/2019-06/02/2019 4.  Followed by antiestrogen therapy. 06/02/2019 5. 07/15/22: Lung nodules on CT scan. 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 PET CT 07/31/23: 3 small Rt pleural based nodules hypermetabolic, hypermet Rt hilar LN ----------------------------------------------------------------------------------- Current treatment: Letrozole  with Verzenio  started 08/14/2023 Guardant360 09/16/2023: No ESR 1 mutation.  MSI high not detected   Verzinio toxicities: Mild headaches Mild nausea Abdominal cramps: No diarrhea Slight leukopenia Mild thrombocytopenia Hospitalization 01/10/2024-01/12/2024: Diarrhea, dehydration, dizziness, bright red blood per rectum   Recommendation: Reduce the dosage of Verzinio to 100 mg p.o. twice daily We provided her with free samples of Verzinio   Slight increase in tumor markers.  I would like to obtain a CT chest abdomen pelvis and follow-up thereafter.

## 2024-02-17 NOTE — Progress Notes (Signed)
 Patient Care Team: Verlene Glimpse, NP as PCP - General Knox Perl, MD as PCP - Cardiology (Cardiology) Sim Dryer, MD as Consulting Physician (General Surgery) Cameron Cea, MD as Consulting Physician (Hematology and Oncology) Johna Myers, MD as Consulting Physician (Radiation Oncology)  DIAGNOSIS:  Encounter Diagnosis  Name Primary?   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, lymph node biopsy positive, intramammary lymph node biopsy negative, ER 100%, PR 100%, Ki-67 20%, HER-2 1+ by IHC, T2 and 1 stage 2A   10/27/2018 Cancer Staging   Staging form: Breast, AJCC 8th Edition - Clinical: Stage IIA (cT2, cN1(f), cM0, G1, ER+, PR+, HER2-) - Signed by Cameron Cea, MD on 11/01/2018   11/11/2018 - 12/20/2018 Neo-Adjuvant Chemotherapy   Neoadjuvant chemotherapy with dose dense Adriamycin  and Cytoxan  every 2 weeks x4 followed by Taxol  weekly x12 (stopped after cycle 3 due to severe neuropathy)   02/01/2019 Breast MRI   Known malignancy in left breast measures 4.7 x 3.5 x 7.2cm, previously 5 x 3 x 10cm.    02/22/2019 Surgery   Left lumpectomy ([facility]): IDC with DCIS, 5.8cm, HER2 negative, ER 90%, PR 90%, Ki67 2%, clear margins,3/5 LN positive for malignancy.    03/02/2019 Cancer Staging   Staging form: Breast, AJCC 8th Edition - Pathologic stage from 03/02/2019: No Stage Recommended (ypT3, pN1a, cM0, G2, ER+, PR+, HER2-) - Signed by Cameron Cea, MD on 03/02/2019   03/11/2019 Surgery   Left axillary lymph node dissection  ([facility]): metastatic carcinoma in 3/14 lymph nodes, focal extranodal involvement by tumor.   04/20/2019 - 06/02/2019 Radiation Therapy   Adjuvant radiation therapy   05/30/2019 -  Anti-estrogen oral therapy   Anastrozole  1 mg daily     CHIEF COMPLIANT: Follow-up of metastatic breast cancer on Verzinio  HISTORY OF PRESENT ILLNESS:   History of Present Illness [name] "[name]" is a 63 year old female with stage 4 cancer who presents for Verzinio follow-up and management of treatment side effects.  She experiences diarrhea, particularly after consuming fruit, and uses Imodium  sparingly. She is cautious with her diet to avoid exacerbating symptoms. A heightened sense of smell since starting treatment affects her work environment, and she uses scented candles to manage odors. During Easter, she had severe gastrointestinal distress, including vomiting and diarrhea, after heat exposure, indicating a need for caution in rising temperatures. Financial challenges arise from a $1,400 deductible due to insurance separating pharmacy and medical expenses, raising concerns about ongoing costs and potential out-of-pocket payments. She attends the gym three times a week to manage stiffness and maintain mobility, despite experiencing fatigue. Her current medications include chemotherapy drugs, and she carries a 'med bag' for managing side effects when away from home.     ALLERGIES:  has no known allergies.  MEDICATIONS:  Current Outpatient Medications  Medication Sig Dispense Refill   abemaciclib  (VERZENIO ) 100 MG tablet Take 1 tablet (100 mg total) by mouth 2 (two) times daily.     atorvastatin  (LIPITOR) 10 MG tablet Take 1 tablet by mouth daily.     carvedilol  (COREG ) 6.25 MG  tablet TAKE 1 TABLET BY MOUTH TWICE DAILY (Patient taking differently: Take 6.25 mg by mouth 2 (two) times daily with a meal.) 180 tablet 1   diclofenac  Sodium (VOLTAREN ) 1 % GEL Apply 4 g topically 4 (four)  times daily. 100 g 4   diphenoxylate -atropine  (LOMOTIL ) 2.5-0.025 MG tablet Take 1 tablet by mouth 4 (four) times daily as needed for diarrhea or loose stools. 30 tablet 1   letrozole  (FEMARA ) 2.5 MG tablet Take 1 tablet (2.5 mg total) by mouth daily. 90 tablet 3   [Paused] lisinopril -hydrochlorothiazide  (ZESTORETIC ) 10-12.5 MG tablet Take 1 tablet by mouth daily. (Patient not taking: Reported on 01/19/2024)     naproxen  (NAPROSYN ) 500 MG tablet Take 500 mg by mouth daily as needed for moderate pain (pain score 4-6).     ondansetron  (ZOFRAN ) 4 MG tablet TAKE 1 TABLET BY MOUTH EVERY 8 HOURS AS NEEDED FOR NAUSEA AND VOMITING (Patient not taking: Reported on 01/19/2024) 18 tablet 1   pantoprazole  (PROTONIX ) 40 MG tablet TAKE 1 TABLET BY MOUTH EVERY DAY 90 tablet 1   Vitamin D , Ergocalciferol , (DRISDOL ) 1.25 MG (50000 UNIT) CAPS capsule TAKE 1 CAPSULE (50,000 UNITS TOTAL) BY MOUTH EVERY 7 (SEVEN) DAYS 12 capsule 1   No current facility-administered medications for this visit.   Facility-Administered Medications Ordered in Other Visits  Medication Dose Route Frequency Provider Last Rate Last Admin   gadopentetate dimeglumine  (MAGNEVIST ) injection 20 mL  20 mL Intravenous Once PRN Glory Larsen, MD       gadopentetate dimeglumine  (MAGNEVIST ) injection 20 mL  20 mL Intravenous Once PRN Glory Larsen, MD        PHYSICAL EXAMINATION: ECOG PERFORMANCE STATUS: 1 - Symptomatic but completely ambulatory  Vitals:   02/17/24 1122  BP: 124/71  Pulse: 77  Resp: 18  Temp: 97.7 F (36.5 C)  SpO2: 100%   Filed Weights   02/17/24 1122  Weight: 281 lb 11.2 oz (127.8 kg)    Physical Exam   (exam performed in the presence of a chaperone)  LABORATORY DATA:  I have reviewed the data as listed    Latest Ref Rng & Units 02/17/2024   10:52 AM 01/19/2024   12:40 PM 01/12/2024    5:41 AM  CMP  Glucose 70 - 99 mg/dL 85  82  098   BUN 8 - 23 mg/dL 20  17  13    Creatinine 0.44 - 1.00 mg/dL 1.19  1.47  8.29    Sodium 135 - 145 mmol/L 139  140  138   Potassium 3.5 - 5.1 mmol/L 4.0  3.8  3.6   Chloride 98 - 111 mmol/L 105  107  103   CO2 22 - 32 mmol/L 29  29  27    Calcium  8.9 - 10.3 mg/dL 9.3  9.4  9.1   Total Protein 6.5 - 8.1 g/dL 7.4  7.5    Total Bilirubin 0.0 - 1.2 mg/dL 0.5  0.3    Alkaline Phos 38 - 126 U/L 71  69    AST 15 - 41 U/L 15  17    ALT 0 - 44 U/L 12  16      Lab Results  Component Value Date   WBC 2.9 (L) 02/17/2024   HGB 11.2 (L) 02/17/2024   HCT 33.3 (L) 02/17/2024   MCV 93.8 02/17/2024   PLT 123 (L) 02/17/2024   NEUTROABS 1.4 (L) 02/17/2024    ASSESSMENT & PLAN:  Malignant neoplasm of upper-outer quadrant of left  breast in female, estrogen receptor positive (HCC) Left lumpectomy: 02/22/2019:([facility]): IDC with DCIS, 5.8cm, margins negative, lymphovascular invasion present HER2 negative, ER 90%, PR 90%, Ki67 2%, clear margins,3/5 LN positive for malignancy.  [facility] 03/11/2019: 3/14 lymph nodes positive with focal extranodal involvement [id number]   Treatment plan: 1.  Neoadjuvant chemotherapy with dose dense Adriamycin  and Cytoxan  every 2 weeks x4 followed by Taxol  weekly x 3 discontinued due to severe peripheral neuropathy completed 01/20/2019 2.  Followed by mastectomy on 02/22/2019 3.  Followed by radiation 04/20/2019-06/02/2019 4.  Followed by antiestrogen therapy. 06/02/2019 5. 07/15/22: Lung nodules on CT scan. 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 PET CT 07/31/23: 3 small Rt pleural based nodules hypermetabolic, hypermet Rt hilar LN ----------------------------------------------------------------------------------- Current treatment: Letrozole  with Verzenio  started 08/14/2023 Guardant360 09/16/2023: No ESR 1 mutation.  MSI high not detected   Verzinio toxicities: Mild headaches Mild nausea Abdominal cramps: No diarrhea Slight leukopenia Mild thrombocytopenia Hospitalization 01/10/2024-01/12/2024: Diarrhea, dehydration, dizziness, bright red blood per  rectum   Recommendation: Reduce the dosage of Verzinio to 100 mg p.o. twice daily We provided her with free samples of Verzinio today   Slight increase in tumor markers.  I would like to obtain a CT chest abdomen pelvis and follow-up thereafter. We are dispensing another month supply of Verzenio  100 mg p.o. twice daily tablets today 02/17/2024. ------------------------------------- Assessment and Plan Assessment & Plan Stage IV cancer Stage IV cancer with ongoing chemotherapy. She faces financial challenges related to insurance coverage and is considering options to manage costs. She continues to manage chemotherapy side effects, including gastrointestinal symptoms and sensitivity to heat. - Continue current chemotherapy regimen. - Explore additional insurance options to manage costs. - Monitor and adjust management of side effects as needed. - Schedule a scan to assess treatment efficacy. - Follow up in a few weeks to review scan results.  Diarrhea Intermittent diarrhea, likely chemotherapy-related. Manages symptoms with Imodium  and dietary modifications, avoiding exacerbating fruits. No significant complaints currently. - Continue Imodium  as needed. - Advise dietary modifications to avoid trigger foods.  Chronic kidney disease, unspecified Chronic kidney disease with creatinine slightly elevated at 1.37, consistent with previous levels. Possible medication-related etiology, specifically Verzenio , as kidney function was normal prior to this medication. No acute intervention required. - Encourage hydration to support kidney function. - Monitor kidney function regularly.  Goals of Care She has accepted her diagnosis and focuses on managing her condition with a positive outlook. She desires to live to 63 years old but is realistic about potential outcomes. She values her faith and emphasizes the importance of time with family and maintaining quality of life. - Support her goals and  preferences in her care plan. - Discuss any changes in goals or preferences as needed.      No orders of the defined types were placed in this encounter.  The patient has a good understanding of the overall plan. she agrees with it. she will call with any problems that may develop before the next visit here. Total time spent: 45 mins including face to face time and time spent for planning, charting and co-ordination of care   Viinay K Eagle Pitta, MD 02/17/24

## 2024-02-18 ENCOUNTER — Other Ambulatory Visit (HOSPITAL_COMMUNITY): Payer: Self-pay

## 2024-02-18 LAB — CANCER ANTIGEN 27.29: CA 27.29: 50.7 U/mL — ABNORMAL HIGH (ref 0.0–38.6)

## 2024-02-23 ENCOUNTER — Encounter (HOSPITAL_COMMUNITY): Payer: Self-pay

## 2024-02-23 ENCOUNTER — Ambulatory Visit (HOSPITAL_COMMUNITY)
Admission: RE | Admit: 2024-02-23 | Discharge: 2024-02-23 | Disposition: A | Discharge status: Home / Self Care | Source: Ambulatory Visit | Attending: Hematology and Oncology | Admitting: Hematology and Oncology

## 2024-02-23 DIAGNOSIS — Z170 Estrogen receptor positive status [ER+]: Secondary | ICD-10-CM | POA: Diagnosis present

## 2024-02-23 DIAGNOSIS — C50412 Malignant neoplasm of upper-outer quadrant of left female breast: Secondary | ICD-10-CM | POA: Diagnosis present

## 2024-02-23 MED ORDER — IOHEXOL 300 MG/ML  SOLN
80.0000 mL | Freq: Once | INTRAMUSCULAR | Status: AC | PRN
  Administered 2024-02-23: 80 mL via INTRAVENOUS

## 2024-02-23 MED ORDER — SODIUM CHLORIDE (PF) 0.9 % IJ SOLN
INTRAMUSCULAR | Status: AC
  Filled 2024-02-23: qty 50

## 2024-03-02 ENCOUNTER — Encounter: Payer: Self-pay | Admitting: *Deleted

## 2024-03-02 NOTE — Progress Notes (Signed)
 Per pt request, RN successfully faxed updated work excuse letter to [employer] at [phone], Coretta Lipscom,e [phone], and [place] [phone].

## 2024-03-09 ENCOUNTER — Inpatient Hospital Stay: Admitting: Hematology and Oncology

## 2024-03-11 ENCOUNTER — Telehealth: Payer: Self-pay

## 2024-03-11 NOTE — Telephone Encounter (Signed)
 Notified Patient of completion of form requested for Disability Benefits. Fax transmission confirmation received. Copy of form placed for pick-up as requested. No other needs or concerns noted at this time.

## 2024-03-14 ENCOUNTER — Other Ambulatory Visit (HOSPITAL_COMMUNITY): Payer: Self-pay

## 2024-03-14 ENCOUNTER — Telehealth: Payer: Self-pay | Admitting: Pharmacy Technician

## 2024-03-14 ENCOUNTER — Telehealth: Payer: Self-pay

## 2024-03-14 ENCOUNTER — Inpatient Hospital Stay

## 2024-03-14 ENCOUNTER — Inpatient Hospital Stay: Attending: Hematology and Oncology | Admitting: Hematology and Oncology

## 2024-03-14 VITALS — BP 133/80 | HR 76 | Temp 97.7°F | Resp 18 | Ht 74.0 in | Wt 284.5 lb

## 2024-03-14 DIAGNOSIS — Z170 Estrogen receptor positive status [ER+]: Secondary | ICD-10-CM | POA: Insufficient documentation

## 2024-03-14 DIAGNOSIS — Z79811 Long term (current) use of aromatase inhibitors: Secondary | ICD-10-CM | POA: Diagnosis not present

## 2024-03-14 DIAGNOSIS — Z1721 Progesterone receptor positive status: Secondary | ICD-10-CM | POA: Insufficient documentation

## 2024-03-14 DIAGNOSIS — Z79899 Other long term (current) drug therapy: Secondary | ICD-10-CM | POA: Diagnosis not present

## 2024-03-14 DIAGNOSIS — Z1732 Human epidermal growth factor receptor 2 negative status: Secondary | ICD-10-CM | POA: Diagnosis not present

## 2024-03-14 DIAGNOSIS — C7801 Secondary malignant neoplasm of right lung: Secondary | ICD-10-CM | POA: Insufficient documentation

## 2024-03-14 DIAGNOSIS — R918 Other nonspecific abnormal finding of lung field: Secondary | ICD-10-CM

## 2024-03-14 DIAGNOSIS — C50412 Malignant neoplasm of upper-outer quadrant of left female breast: Secondary | ICD-10-CM

## 2024-03-14 LAB — CMP (CANCER CENTER ONLY)
ALT: 12 U/L (ref 0–44)
AST: 15 U/L (ref 15–41)
Albumin: 4.1 g/dL (ref 3.5–5.0)
Alkaline Phosphatase: 67 U/L (ref 38–126)
Anion gap: 4 — ABNORMAL LOW (ref 5–15)
BUN: 20 mg/dL (ref 8–23)
CO2: 31 mmol/L (ref 22–32)
Calcium: 9.9 mg/dL (ref 8.9–10.3)
Chloride: 107 mmol/L (ref 98–111)
Creatinine: 1.2 mg/dL — ABNORMAL HIGH (ref 0.44–1.00)
GFR, Estimated: 51 mL/min — ABNORMAL LOW (ref >60)
Glucose, Bld: 90 mg/dL (ref 70–99)
Potassium: 4.3 mmol/L (ref 3.5–5.1)
Sodium: 142 mmol/L (ref 135–145)
Total Bilirubin: 0.6 mg/dL (ref 0.0–1.2)
Total Protein: 7.3 g/dL (ref 6.5–8.1)

## 2024-03-14 LAB — CBC WITH DIFFERENTIAL (CANCER CENTER ONLY)
Abs Immature Granulocytes: 0.01 10*3/uL (ref 0.00–0.07)
Basophils Absolute: 0 10*3/uL (ref 0.0–0.1)
Basophils Relative: 1 %
Eosinophils Absolute: 0.1 10*3/uL (ref 0.0–0.5)
Eosinophils Relative: 2 %
HCT: 33.5 % — ABNORMAL LOW (ref 36.0–46.0)
Hemoglobin: 11.2 g/dL — ABNORMAL LOW (ref 12.0–15.0)
Immature Granulocytes: 0 %
Lymphocytes Relative: 37 %
Lymphs Abs: 1.2 10*3/uL (ref 0.7–4.0)
MCH: 31.3 pg (ref 26.0–34.0)
MCHC: 33.4 g/dL (ref 30.0–36.0)
MCV: 93.6 fL (ref 80.0–100.0)
Monocytes Absolute: 0.3 10*3/uL (ref 0.1–1.0)
Monocytes Relative: 9 %
Neutro Abs: 1.7 10*3/uL (ref 1.7–7.7)
Neutrophils Relative %: 51 %
Platelet Count: 140 10*3/uL — ABNORMAL LOW (ref 150–400)
RBC: 3.58 MIL/uL — ABNORMAL LOW (ref 3.87–5.11)
RDW: 12 % (ref 11.5–15.5)
WBC Count: 3.3 10*3/uL — ABNORMAL LOW (ref 4.0–10.5)
nRBC: 0 % (ref 0.0–0.2)

## 2024-03-14 NOTE — Telephone Encounter (Signed)
-----   Message from Nurse Ivar Mariscal sent at 03/14/2024 12:38 PM EDT ----- Regarding: RE: Abemacicib donated drug [name],   I am pretty sure her insurance company only allowed that a couple times, if my memory serves me right. This has been a very tedious situation. Im glad to hear she is getting a supplemental.   -LB ----- Message ----- From: Althea Atkinson, RPH-CPP Sent: 03/14/2024  10:17 AM EDT To: Sullivan Endow, CPhT; Cameron Cea, MD; # Subject: RE: Abemacicib donated drug                    Hello,  I would just call her and let her know. I will copy Dr. Gudena and his nursing team so they know for the next fill, she may not need to use the donated drug program. I gave her a 28-day supply today so this should last her until end of month. Thank you for looking into it.  Regards, John ----- Message ----- From: Sullivan Endow, CPhT Sent: 03/14/2024   9:56 AM EDT To: Althea Atkinson, RPH-CPP; # Subject: RE: Abemacicib donated drug                    She also has a copay card that makes it $0. We can fill this for her, unless her ins does something weird and only lets us  fill it once or twice. It doesn't tell me that, though. ----- Message ----- From: Althea Atkinson, RPH-CPP Sent: 03/14/2024   9:34 AM EDT To: Sullivan Endow, CPhT; # Subject: Abemacicib donated drug                        Hello,  I saw this patient today to give her more donated drug. She mentioned she is trying to get a supplemental insurance so you may want to keep on your radar. Please let me know if questions. Thank you.  Regards, Autry Legions

## 2024-03-14 NOTE — Assessment & Plan Note (Signed)
 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  AX L&D 03/11/2019: 3/14 lymph nodes positive with focal extranodal involvement [id number]   Treatment plan: 1.  Neoadjuvant chemotherapy with dose dense Adriamycin  and Cytoxan  every 2 weeks x4 followed by Taxol  weekly x 3 discontinued due to severe peripheral neuropathy completed 01/20/2019 2.  Followed by mastectomy on 02/22/2019 3.  Followed by radiation 04/20/2019-06/02/2019 4.  Followed by antiestrogen therapy. 06/02/2019 5. 07/15/22: Lung nodules on CT scan. 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 PET CT 07/31/23: 3 small Rt pleural based nodules hypermetabolic, hypermet Rt hilar LN ----------------------------------------------------------------------------------- Current treatment: Letrozole  with Verzenio  started 08/14/2023 Guardant360 09/16/2023: No ESR 1 mutation.  MSI high not detected   Verzinio toxicities: Mild headaches Mild nausea Abdominal cramps: No diarrhea Slight leukopenia Mild thrombocytopenia Hospitalization 01/10/2024-01/12/2024: Diarrhea, dehydration, dizziness, bright red blood per rectum   Recommendation: Reduce the dosage of Verzinio to 100 mg p.o. twice daily We provided her with free samples of Verzinio today   Slight increase in tumor markers: CT CAP 02/23/2024: Similar pulmonary metastases. Continue with the current treatment and recheck with another CT in 3 months.

## 2024-03-14 NOTE — Telephone Encounter (Signed)
 Oral Oncology Patient Advocate Encounter  After completing a benefits investigation, prior authorization for Verzenio  is not required at this time through [facility]).  Patient's copay is $22.21.  PA on file until 08/13/24- per test claim. $0 after Copay card on file.   RxBin: [id number] PCN: [id number] Member ID: [id number] Group ID: [id number]    Roda Cirri, CPhT Specialty Pharmacy Patient Advocate Phone: [phone] Fax: [phone]

## 2024-03-14 NOTE — Telephone Encounter (Signed)
 Pt came to pick up her hard copy of her  disability forms and sign an ROI. Pt had no questions or concerns at this time.

## 2024-03-14 NOTE — Progress Notes (Signed)
 Patient Care Team: Verlene Glimpse, NP as PCP - General Knox Perl, MD as PCP - Cardiology (Cardiology) Sim Dryer, MD as Consulting Physician (General Surgery) Cameron Cea, MD as Consulting Physician (Hematology and Oncology) Johna Myers, MD as Consulting Physician (Radiation Oncology)  DIAGNOSIS:  Encounter Diagnoses  Name Primary?   Malignant neoplasm of upper-outer quadrant of left breast in female, estrogen receptor positive (HCC) Yes   Lung nodule, multiple     SUMMARY OF ONCOLOGIC HISTORY: Oncology History  Malignant neoplasm of upper-outer quadrant of left breast in female, estrogen receptor positive (HCC)  10/22/2018 Initial Diagnosis   Screening detected left breast architectural distortion: 4.5 cm, UOQ, by ultrasound measured 5 cm at 1 o'clock position multiple enlarged lymph nodes, biopsy revealed grade 1 IDC with DCIS with lymphovascular invasion, lymph node biopsy positive, intramammary lymph node biopsy negative, ER 100%, PR 100%, Ki-67 20%, HER-2 1+ by IHC, T2 and 1 stage 2A   10/27/2018 Cancer Staging   Staging form: Breast, AJCC 8th Edition - Clinical: Stage IIA (cT2, cN1(f), cM0, G1, ER+, PR+, HER2-) - Signed by Cameron Cea, MD on 11/01/2018   11/11/2018 - 12/20/2018 Neo-Adjuvant Chemotherapy   Neoadjuvant chemotherapy with dose dense Adriamycin  and Cytoxan  every 2 weeks x4 followed by Taxol  weekly x12 (stopped after cycle 3 due to severe neuropathy)   02/01/2019 Breast MRI   Known malignancy in left breast measures 4.7 x 3.5 x 7.2cm, previously 5 x 3 x 10cm.    02/22/2019 Surgery   Left lumpectomy ([facility]): IDC with DCIS, 5.8cm, HER2 negative, ER 90%, PR 90%, Ki67 2%, clear margins,3/5 LN positive for malignancy.    03/02/2019 Cancer Staging   Staging form: Breast, AJCC 8th Edition - Pathologic stage from 03/02/2019: No Stage Recommended (ypT3, pN1a, cM0, G2, ER+, PR+, HER2-) - Signed by Cameron Cea, MD on 03/02/2019   03/11/2019 Surgery   Left  axillary lymph node dissection ([facility]): metastatic carcinoma in 3/14 lymph nodes, focal extranodal involvement by tumor.   04/20/2019 - 06/02/2019 Radiation Therapy   Adjuvant radiation therapy   05/30/2019 -  Anti-estrogen oral therapy   Anastrozole  1 mg daily     CHIEF COMPLIANT: Follow-up on Verzinio with letrozole   HISTORY OF PRESENT ILLNESS:  History of Present Illness [name] is a 63 year old female with lung nodules who presents for follow-up on her CT scan results.  The lung nodules remain very tiny and unchanged in size, primarily located in the right lung, with no new nodules and nothing observed in the left lung. Her tumor marker is decreasing, indicating a positive response to the current treatment regimen.  She is proactive in managing her health and seeks information about her prognosis and expected changes in her condition.  She actively manages her medication and communicates with her insurance to ensure coverage, considering additional insurance to cover any gaps.     ALLERGIES:  has no known allergies.  MEDICATIONS:  Current Outpatient Medications  Medication Sig Dispense Refill   abemaciclib  (VERZENIO ) 100 MG tablet Take 1 tablet (100 mg total) by mouth 2 (two) times daily.     atorvastatin  (LIPITOR) 10 MG tablet Take 1 tablet by mouth daily.     carvedilol  (COREG ) 6.25 MG tablet TAKE 1 TABLET BY MOUTH TWICE DAILY (Patient taking differently: Take 6.25 mg by mouth 2 (two) times daily with a meal.) 180 tablet 1   diclofenac  Sodium (VOLTAREN ) 1 % GEL Apply 4 g topically 4 (four) times daily. 100 g  4   diphenoxylate -atropine  (LOMOTIL ) 2.5-0.025 MG tablet Take 1 tablet by mouth 4 (four) times daily as needed for diarrhea or loose stools. 30 tablet 1   letrozole  (FEMARA ) 2.5 MG tablet Take 1 tablet (2.5 mg total) by mouth daily. 90 tablet 3   naproxen  (NAPROSYN ) 500 MG tablet Take 500 mg by mouth daily as needed for moderate pain (pain score 4-6).      pantoprazole  (PROTONIX ) 40 MG tablet TAKE 1 TABLET BY MOUTH EVERY DAY 90 tablet 1   Vitamin D , Ergocalciferol , (DRISDOL ) 1.25 MG (50000 UNIT) CAPS capsule TAKE 1 CAPSULE (50,000 UNITS TOTAL) BY MOUTH EVERY 7 (SEVEN) DAYS 12 capsule 1   [Paused] lisinopril -hydrochlorothiazide  (ZESTORETIC ) 10-12.5 MG tablet Take 1 tablet by mouth daily. (Patient not taking: Reported on 01/19/2024)     ondansetron  (ZOFRAN ) 4 MG tablet TAKE 1 TABLET BY MOUTH EVERY 8 HOURS AS NEEDED FOR NAUSEA AND VOMITING (Patient not taking: No sig reported) 18 tablet 1   No current facility-administered medications for this visit.   Facility-Administered Medications Ordered in Other Visits  Medication Dose Route Frequency Provider Last Rate Last Admin   gadopentetate dimeglumine  (MAGNEVIST ) injection 20 mL  20 mL Intravenous Once PRN Glory Larsen, MD       gadopentetate dimeglumine  (MAGNEVIST ) injection 20 mL  20 mL Intravenous Once PRN Glory Larsen, MD        PHYSICAL EXAMINATION: ECOG PERFORMANCE STATUS: 1 - Symptomatic but completely ambulatory  Vitals:   03/14/24 0820  BP: 133/80  Pulse: 76  Resp: 18  Temp: 97.7 F (36.5 C)  SpO2: 100%   Filed Weights   03/14/24 0820  Weight: 284 lb 8 oz (129 kg)     LABORATORY DATA:  I have reviewed the data as listed    Latest Ref Rng & Units 02/17/2024   10:52 AM 01/19/2024   12:40 PM 01/12/2024    5:41 AM  CMP  Glucose 70 - 99 mg/dL 85  82  784   BUN 8 - 23 mg/dL 20  17  13    Creatinine 0.44 - 1.00 mg/dL 6.96  2.95  2.84   Sodium 135 - 145 mmol/L 139  140  138   Potassium 3.5 - 5.1 mmol/L 4.0  3.8  3.6   Chloride 98 - 111 mmol/L 105  107  103   CO2 22 - 32 mmol/L 29  29  27    Calcium  8.9 - 10.3 mg/dL 9.3  9.4  9.1   Total Protein 6.5 - 8.1 g/dL 7.4  7.5    Total Bilirubin 0.0 - 1.2 mg/dL 0.5  0.3    Alkaline Phos 38 - 126 U/L 71  69    AST 15 - 41 U/L 15  17    ALT 0 - 44 U/L 12  16      Lab Results  Component Value Date   WBC 2.9 (L) 02/17/2024   HGB  11.2 (L) 02/17/2024   HCT 33.3 (L) 02/17/2024   MCV 93.8 02/17/2024   PLT 123 (L) 02/17/2024   NEUTROABS 1.4 (L) 02/17/2024    ASSESSMENT & PLAN:  Malignant neoplasm of upper-outer quadrant of left breast in female, estrogen receptor positive (HCC) Left lumpectomy: 02/22/2019:([facility]): IDC with DCIS, 5.8cm, margins negative, lymphovascular invasion present HER2 negative, ER 90%, PR 90%, Ki67 2%, clear margins,3/5 LN positive for malignancy.  AX L&D 03/11/2019: 3/14 lymph nodes positive with focal extranodal involvement [id number]   Treatment plan: 1.  Neoadjuvant chemotherapy with dose  dense Adriamycin  and Cytoxan  every 2 weeks x4 followed by Taxol  weekly x 3 discontinued due to severe peripheral neuropathy completed 01/20/2019 2.  Followed by mastectomy on 02/22/2019 3.  Followed by radiation 04/20/2019-06/02/2019 4.  Followed by antiestrogen therapy. 06/02/2019 5. 07/15/22: Lung nodules on CT scan. 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 PET CT 07/31/23: 3 small Rt pleural based nodules hypermetabolic, hypermet Rt hilar LN ----------------------------------------------------------------------------------- Current treatment: Letrozole  with Verzenio  started 08/14/2023 Guardant360 09/16/2023: No ESR 1 mutation.  MSI high not detected   Verzinio toxicities: Mild headaches Mild nausea Abdominal cramps: No diarrhea Slight leukopenia Mild thrombocytopenia Hospitalization 01/10/2024-01/12/2024: Diarrhea, dehydration, dizziness, bright red blood per rectum   Recommendation: tolerating the dosage of Verzinio to 100 mg p.o. twice daily We provided her with free samples of Verzinio today   Slight increase in tumor markers: CT CAP 02/23/2024: Similar pulmonary metastases. Tumor markers have stabilized and slightly improved. Continue with the current treatment and recheck with another CT in 3 months. Return to clinic monthly to receive samples of Verzinio  Assessment & Plan Malignant neoplasm  of upper-outer quadrant of left breast with lung metastasis Metastatic breast cancer with stable right lung nodules. Tumor markers decreasing, indicating positive response to current regimen. Treatment effective and well-tolerated. Prognosis improved with treatment advancements, potential life expectancy five to ten years. - Continue Abemaciclib  and Letrozole . - Schedule follow-up CT scan of chest in three months to monitor lung nodules. - Perform blood tests today to monitor tumor markers and other relevant parameters. - Discuss prognosis and treatment options, emphasizing maintaining a positive outlook and staying active.    Follow-up Regular follow-up necessary to monitor treatment response and adjust plan. - Schedule monthly follow-up appointments to assess treatment response and manage side effects. - Coordinate with insurance to ensure coverage for medications and treatments.      Orders Placed This Encounter  Procedures   CT Chest W Contrast    Standing Status:   Future    Expected Date:   05/25/2024    Expiration Date:   03/14/2025    If indicated for the ordered procedure, I authorize the administration of contrast media per Radiology protocol:   Yes    Does the patient have a contrast media/X-ray dye allergy?:   No    Preferred imaging location?:   [facility]    Release to patient:   Immediate [1]   The patient has a good understanding of the overall plan. she agrees with it. she will call with any problems that may develop before the next visit here. Total time spent: 30 mins including face to face time and time spent for planning, charting and co-ordination of care   Margert Sheerer, MD 03/14/24

## 2024-03-15 ENCOUNTER — Other Ambulatory Visit: Payer: Self-pay | Admitting: Pharmacy Technician

## 2024-03-15 ENCOUNTER — Other Ambulatory Visit (HOSPITAL_COMMUNITY): Payer: Self-pay

## 2024-03-15 ENCOUNTER — Other Ambulatory Visit: Payer: Self-pay

## 2024-03-15 ENCOUNTER — Other Ambulatory Visit: Payer: Self-pay | Admitting: Pharmacist

## 2024-03-15 ENCOUNTER — Other Ambulatory Visit: Payer: Self-pay | Admitting: *Deleted

## 2024-03-15 LAB — CANCER ANTIGEN 27.29: CA 27.29: 57.1 U/mL — ABNORMAL HIGH (ref 0.0–38.6)

## 2024-03-15 MED ORDER — ABEMACICLIB 100 MG PO TABS: 100.0000 mg | ORAL_TABLET | Freq: Two times a day (BID) | ORAL | Status: DC

## 2024-03-15 MED ORDER — ABEMACICLIB 100 MG PO TABS
100.0000 mg | ORAL_TABLET | Freq: Two times a day (BID) | ORAL | 5 refills | Status: DC
  Filled 2024-03-15: qty 56, 28d supply, fill #0
  Filled 2024-04-07: qty 56, 28d supply, fill #1
  Filled 2024-05-11 – 2024-05-13 (×2): qty 56, 28d supply, fill #2
  Filled 2024-06-07 – 2024-06-14 (×2): qty 56, 28d supply, fill #3
  Filled 2024-07-07: qty 56, 28d supply, fill #4
  Filled 2024-08-05 – 2024-08-15 (×2): qty 56, 28d supply, fill #5

## 2024-03-15 NOTE — Progress Notes (Signed)
 Patient transferring  to [facility]. Patient initially counseled in clinic visit note on 09/08/23.

## 2024-03-15 NOTE — Progress Notes (Signed)
 Specialty Pharmacy Initial Fill Coordination Note  [name] is a 63 y.o. female contacted today regarding refills of specialty medication(s) Abemaciclib  (VERZENIO ) .  Patient requested [facility] at [facility] at [place]  on 03/16/24   Medication will be filled on 03/15/24.   Patient is aware of $0 copayment.  Use copay card.

## 2024-03-16 ENCOUNTER — Other Ambulatory Visit: Payer: Self-pay

## 2024-03-24 ENCOUNTER — Institutional Professional Consult (permissible substitution) (INDEPENDENT_AMBULATORY_CARE_PROVIDER_SITE_OTHER): Admitting: Otolaryngology

## 2024-04-05 ENCOUNTER — Other Ambulatory Visit (HOSPITAL_COMMUNITY): Payer: Self-pay

## 2024-04-05 NOTE — Progress Notes (Signed)
 Specialty Pharmacy Ongoing Clinical Assessment Note  [name] is a 63 y.o. female who is being followed by the specialty pharmacy service for RxSp Oncology   Patient's specialty medication(s) reviewed today: Abemaciclib  (VERZENIO )   Missed doses in the last 4 weeks: 0   Patient/Caregiver did not have any additional questions or concerns.   Therapeutic benefit summary: Patient is achieving benefit   Adverse events/side effects summary: Experienced adverse events/side effects (constipation, has stopped Imodium , which has helped, previously had diarrhea)   Patient's therapy is appropriate to: Continue    Goals Addressed             This Visit's Progress    Maintain optimal adherence to therapy   Worsening    Patient is on track. Patient will maintain adherence.  Patient's provider documented a slight increase in tumor markers, they will reevaluate with a CT in 3 months, documented in office visit on 03/14/24.          Follow up: 3 months  Silvano LOISE Dolly Specialty Pharmacist

## 2024-04-07 ENCOUNTER — Other Ambulatory Visit: Payer: Self-pay

## 2024-04-07 NOTE — Progress Notes (Signed)
 Specialty Pharmacy Refill Coordination Note  [name] is a 63 y.o. female contacted today regarding refills of specialty medication(s) Abemaciclib  (VERZENIO )   Patient requested [name] at [facility] at [place] date: 04/18/24   Medication will be filled on 04/18/24.

## 2024-04-10 ENCOUNTER — Other Ambulatory Visit: Payer: Self-pay | Admitting: Hematology and Oncology

## 2024-04-10 DIAGNOSIS — K219 Gastro-esophageal reflux disease without esophagitis: Secondary | ICD-10-CM

## 2024-04-13 NOTE — Assessment & Plan Note (Signed)
 Left lumpectomy: 02/22/2019:([name]): IDC with DCIS, 5.8cm, margins negative, lymphovascular invasion present HER2 negative, ER 90%, PR 90%, Ki67 2%, clear margins,3/5 LN positive for malignancy.  AX L&D 03/11/2019: 3/14 lymph nodes positive with focal extranodal involvement [id number]   Treatment plan: 1.  Neoadjuvant chemotherapy with dose dense Adriamycin  and Cytoxan  every 2 weeks x4 followed by Taxol  weekly x 3 discontinued due to severe peripheral neuropathy completed 01/20/2019 2.  Followed by mastectomy on 02/22/2019 3.  Followed by radiation 04/20/2019-06/02/2019 4.  Followed by antiestrogen therapy. 06/02/2019 5. 07/15/22: Lung nodules on CT scan. 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 PET CT 07/31/23: 3 small Rt pleural based nodules hypermetabolic, hypermet Rt hilar LN ----------------------------------------------------------------------------------- Current treatment: Letrozole  with Verzenio  started 08/14/2023 Guardant360 09/16/2023: No ESR 1 mutation.  MSI high not detected   Verzinio toxicities: Mild headaches Mild nausea Abdominal cramps: No diarrhea Slight leukopenia Mild thrombocytopenia Hospitalization 01/10/2024-01/12/2024: Diarrhea, dehydration, dizziness, bright red blood per rectum   Recommendation: tolerating the dosage of Verzinio to 100 mg p.o. twice daily We provided her with free samples of Verzinio today   Slight increase in tumor markers: CT CAP 02/23/2024: Similar pulmonary metastases. Tumor markers have stabilized and slightly improved. Continue with the current treatment and recheck with another CT in 3 months. Return to clinic monthly to receive samples of Verzinio

## 2024-04-14 ENCOUNTER — Other Ambulatory Visit (HOSPITAL_COMMUNITY): Payer: Self-pay

## 2024-04-14 ENCOUNTER — Inpatient Hospital Stay: Attending: Hematology and Oncology

## 2024-04-14 ENCOUNTER — Inpatient Hospital Stay (HOSPITAL_BASED_OUTPATIENT_CLINIC_OR_DEPARTMENT_OTHER): Admitting: Hematology and Oncology

## 2024-04-14 VITALS — BP 115/78 | HR 75 | Temp 97.9°F | Resp 17 | Ht 74.0 in | Wt 283.4 lb

## 2024-04-14 DIAGNOSIS — Z923 Personal history of irradiation: Secondary | ICD-10-CM | POA: Insufficient documentation

## 2024-04-14 DIAGNOSIS — C773 Secondary and unspecified malignant neoplasm of axilla and upper limb lymph nodes: Secondary | ICD-10-CM | POA: Insufficient documentation

## 2024-04-14 DIAGNOSIS — Z1721 Progesterone receptor positive status: Secondary | ICD-10-CM | POA: Insufficient documentation

## 2024-04-14 DIAGNOSIS — C50412 Malignant neoplasm of upper-outer quadrant of left female breast: Secondary | ICD-10-CM | POA: Diagnosis present

## 2024-04-14 DIAGNOSIS — Z79811 Long term (current) use of aromatase inhibitors: Secondary | ICD-10-CM | POA: Insufficient documentation

## 2024-04-14 DIAGNOSIS — Z1732 Human epidermal growth factor receptor 2 negative status: Secondary | ICD-10-CM | POA: Insufficient documentation

## 2024-04-14 DIAGNOSIS — Z9221 Personal history of antineoplastic chemotherapy: Secondary | ICD-10-CM | POA: Insufficient documentation

## 2024-04-14 DIAGNOSIS — Z170 Estrogen receptor positive status [ER+]: Secondary | ICD-10-CM

## 2024-04-14 DIAGNOSIS — C7800 Secondary malignant neoplasm of unspecified lung: Secondary | ICD-10-CM | POA: Insufficient documentation

## 2024-04-14 DIAGNOSIS — Z79899 Other long term (current) drug therapy: Secondary | ICD-10-CM | POA: Insufficient documentation

## 2024-04-14 LAB — CMP (CANCER CENTER ONLY)
ALT: 13 U/L (ref 0–44)
AST: 14 U/L — ABNORMAL LOW (ref 15–41)
Albumin: 3.7 g/dL (ref 3.5–5.0)
Alkaline Phosphatase: 61 U/L (ref 38–126)
Anion gap: 4 — ABNORMAL LOW (ref 5–15)
BUN: 14 mg/dL (ref 8–23)
CO2: 31 mmol/L (ref 22–32)
Calcium: 9.6 mg/dL (ref 8.9–10.3)
Chloride: 107 mmol/L (ref 98–111)
Creatinine: 1.12 mg/dL — ABNORMAL HIGH (ref 0.44–1.00)
GFR, Estimated: 55 mL/min — ABNORMAL LOW (ref >60)
Glucose, Bld: 99 mg/dL (ref 70–99)
Potassium: 4.2 mmol/L (ref 3.5–5.1)
Sodium: 142 mmol/L (ref 135–145)
Total Bilirubin: 0.5 mg/dL (ref 0.0–1.2)
Total Protein: 6.7 g/dL (ref 6.5–8.1)

## 2024-04-14 LAB — CBC WITH DIFFERENTIAL (CANCER CENTER ONLY)
Abs Immature Granulocytes: 0.01 10*3/uL (ref 0.00–0.07)
Basophils Absolute: 0 10*3/uL (ref 0.0–0.1)
Basophils Relative: 1 %
Eosinophils Absolute: 0.1 10*3/uL (ref 0.0–0.5)
Eosinophils Relative: 2 %
HCT: 33.3 % — ABNORMAL LOW (ref 36.0–46.0)
Hemoglobin: 11 g/dL — ABNORMAL LOW (ref 12.0–15.0)
Immature Granulocytes: 0 %
Lymphocytes Relative: 30 %
Lymphs Abs: 0.9 10*3/uL (ref 0.7–4.0)
MCH: 30.6 pg (ref 26.0–34.0)
MCHC: 33 g/dL (ref 30.0–36.0)
MCV: 92.5 fL (ref 80.0–100.0)
Monocytes Absolute: 0.3 10*3/uL (ref 0.1–1.0)
Monocytes Relative: 9 %
Neutro Abs: 1.7 10*3/uL (ref 1.7–7.7)
Neutrophils Relative %: 58 %
Platelet Count: 146 10*3/uL — ABNORMAL LOW (ref 150–400)
RBC: 3.6 MIL/uL — ABNORMAL LOW (ref 3.87–5.11)
RDW: 11.7 % (ref 11.5–15.5)
WBC Count: 3 10*3/uL — ABNORMAL LOW (ref 4.0–10.5)
nRBC: 0 % (ref 0.0–0.2)

## 2024-04-14 NOTE — Progress Notes (Signed)
 Patient Care Team: Cristopher Suzen HERO, NP as PCP - General Ladona Heinz, MD as PCP - Cardiology (Cardiology) Vanderbilt Ned, MD as Consulting Physician (General Surgery) Odean Potts, MD as Consulting Physician (Hematology and Oncology) Dewey Rush, MD as Consulting Physician (Radiation Oncology)  DIAGNOSIS:  Encounter Diagnosis  Name Primary?   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, lymph node biopsy positive, intramammary lymph node biopsy negative, ER 100%, PR 100%, Ki-67 20%, HER-2 1+ by IHC, T2 and 1 stage 2A   10/27/2018 Cancer Staging   Staging form: Breast, AJCC 8th Edition - Clinical: Stage IIA (cT2, cN1(f), cM0, G1, ER+, PR+, HER2-) - Signed by Odean Potts, MD on 11/01/2018   11/11/2018 - 12/20/2018 Neo-Adjuvant Chemotherapy   Neoadjuvant chemotherapy with dose dense Adriamycin  and Cytoxan  every 2 weeks x4 followed by Taxol  weekly x12 (stopped after cycle 3 due to severe neuropathy)   02/01/2019 Breast MRI   Known malignancy in left breast measures 4.7 x 3.5 x 7.2cm, previously 5 x 3 x 10cm.    02/22/2019 Surgery   Left lumpectomy ([facility]): IDC with DCIS, 5.8cm, HER2 negative, ER 90%, PR 90%, Ki67 2%, clear margins,3/5 LN positive for malignancy.    03/02/2019 Cancer Staging   Staging form: Breast, AJCC 8th Edition - Pathologic stage from 03/02/2019: No Stage Recommended (ypT3, pN1a, cM0, G2, ER+, PR+, HER2-) - Signed by Odean Potts, MD on 03/02/2019   03/11/2019 Surgery   Left axillary lymph node dissection  ([facility]): metastatic carcinoma in 3/14 lymph nodes, focal extranodal involvement by tumor.   04/20/2019 - 06/02/2019 Radiation Therapy   Adjuvant radiation therapy   05/30/2019 -  Anti-estrogen oral therapy   Anastrozole  1 mg daily     CHIEF COMPLIANT: Follow-up on Verzinio  HISTORY OF PRESENT ILLNESS:  History of Present Illness [name] is a 63 year old female who presents for a follow-up visit regarding her medication management and recent symptoms.  She experiences constipation, a change from her usual diarrhea, with bowel movements having a sandy texture. She recently stopped Imodium , which has stabilized her bowel movements. She has stopped eating fruit and other foods that exacerbate diarrhea but now considers reintroducing them.  Recent lab results show a stable low white blood cell count, ranging from 2.9 to 3.3 over the past three months. Hemoglobin is at 11, and platelet count ranges from 103 to 146. Neutrophil count is adequate, with no current infection risk.  She successfully obtained her medication from [facility], resolving a previous issue in July.  She is concerned about recent weight gain, which is unusual for her.     ALLERGIES:  has no known allergies.  MEDICATIONS:  Current Outpatient Medications  Medication Sig Dispense Refill   abemaciclib  (VERZENIO ) 100 MG tablet Take 1 tablet (100 mg total) by mouth 2 (two) times daily. 56 tablet 5   atorvastatin  (LIPITOR) 10 MG tablet Take 1 tablet by mouth daily.     carvedilol  (COREG ) 6.25 MG tablet TAKE 1 TABLET BY MOUTH TWICE DAILY (Patient taking differently: Take 6.25 mg by mouth 2 (two)  times daily with a meal.) 180 tablet 1   diclofenac  Sodium (VOLTAREN ) 1 % GEL Apply 4 g topically 4 (four) times daily. 100 g 4   diphenoxylate -atropine  (LOMOTIL ) 2.5-0.025 MG tablet Take 1 tablet by mouth 4 (four) times daily as needed for diarrhea or loose stools. 30 tablet 1   letrozole  (FEMARA ) 2.5 MG  tablet Take 1 tablet (2.5 mg total) by mouth daily. 90 tablet 3   [Paused] lisinopril -hydrochlorothiazide  (ZESTORETIC ) 10-12.5 MG tablet Take 1 tablet by mouth daily. (Patient not taking: Reported on 01/19/2024)     naproxen  (NAPROSYN ) 500 MG tablet Take 500 mg by mouth daily as needed for moderate pain (pain score 4-6).     ondansetron  (ZOFRAN ) 4 MG tablet TAKE 1 TABLET BY MOUTH EVERY 8 HOURS AS NEEDED FOR NAUSEA AND VOMITING (Patient not taking: No sig reported) 18 tablet 1   pantoprazole  (PROTONIX ) 40 MG tablet TAKE 1 TABLET BY MOUTH EVERY DAY 30 tablet 5   Vitamin D , Ergocalciferol , (DRISDOL ) 1.25 MG (50000 UNIT) CAPS capsule TAKE 1 CAPSULE (50,000 UNITS TOTAL) BY MOUTH EVERY 7 (SEVEN) DAYS 12 capsule 1   No current facility-administered medications for this visit.   Facility-Administered Medications Ordered in Other Visits  Medication Dose Route Frequency Provider Last Rate Last Admin   gadopentetate dimeglumine  (MAGNEVIST ) injection 20 mL  20 mL Intravenous Once PRN Ines Onetha NOVAK, MD       gadopentetate dimeglumine  (MAGNEVIST ) injection 20 mL  20 mL Intravenous Once PRN Ines Onetha NOVAK, MD        PHYSICAL EXAMINATION: ECOG PERFORMANCE STATUS: 1 - Symptomatic but completely ambulatory  Vitals:   04/14/24 0828  BP: 115/78  Pulse: 75  Resp: 17  Temp: 97.9 F (36.6 C)  SpO2: 100%   Filed Weights   04/14/24 0828  Weight: 283 lb 6.4 oz (128.5 kg)    Physical Exam   (exam performed in the presence of a chaperone)  LABORATORY DATA:  I have reviewed the data as listed    Latest Ref Rng & Units 03/14/2024    9:18 AM 02/17/2024   10:52 AM 01/19/2024   12:40 PM  CMP  Glucose 70 - 99 mg/dL 90  85  82   BUN 8 - 23 mg/dL 20  20  17    Creatinine 0.44 - 1.00 mg/dL 8.79  8.62  8.94   Sodium 135 - 145 mmol/L 142  139  140   Potassium 3.5 - 5.1 mmol/L 4.3  4.0  3.8   Chloride 98 - 111 mmol/L 107  105  107   CO2 22 - 32 mmol/L 31  29  29    Calcium  8.9 - 10.3 mg/dL 9.9  9.3  9.4    Total Protein 6.5 - 8.1 g/dL 7.3  7.4  7.5   Total Bilirubin 0.0 - 1.2 mg/dL 0.6  0.5  0.3   Alkaline Phos 38 - 126 U/L 67  71  69   AST 15 - 41 U/L 15  15  17    ALT 0 - 44 U/L 12  12  16      Lab Results  Component Value Date   WBC 3.0 (L) 04/14/2024   HGB 11.0 (L) 04/14/2024   HCT 33.3 (L) 04/14/2024   MCV 92.5 04/14/2024   PLT 146 (L) 04/14/2024   NEUTROABS 1.7 04/14/2024    ASSESSMENT & PLAN:  Malignant neoplasm of upper-outer quadrant of left breast in female, estrogen receptor positive (HCC) Left lumpectomy: 02/22/2019:([facility]): IDC with DCIS, 5.8cm,  margins negative, lymphovascular invasion present HER2 negative, ER 90%, PR 90%, Ki67 2%, clear margins,3/5 LN positive for malignancy.  AX L&D 03/11/2019: 3/14 lymph nodes positive with focal extranodal involvement [id number]   Treatment plan: 1.  Neoadjuvant chemotherapy with dose dense Adriamycin  and Cytoxan  every 2 weeks x4 followed by Taxol  weekly x 3 discontinued due to severe peripheral neuropathy completed 01/20/2019 2.  Followed by mastectomy on 02/22/2019 3.  Followed by radiation 04/20/2019-06/02/2019 4.  Followed by antiestrogen therapy. 06/02/2019 5. 07/15/22: Lung nodules on CT scan. 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 PET CT 07/31/23: 3 small Rt pleural based nodules hypermetabolic, hypermet Rt hilar LN ----------------------------------------------------------------------------------- Current treatment: Letrozole  with Verzenio  started 08/14/2023 Guardant360 09/16/2023: No ESR 1 mutation.  MSI high not detected   Verzinio toxicities: Mild headaches Mild nausea Abdominal cramps: No diarrhea Slight leukopenia Mild thrombocytopenia Hospitalization 01/10/2024-01/12/2024: Diarrhea, dehydration, dizziness, bright red blood per rectum   Recommendation: tolerating the dosage of Verzinio to 100 mg p.o. twice daily We provided her with free samples of Verzinio today   Slight increase in tumor markers: CT CAP  02/23/2024: Similar pulmonary metastases. Tumor markers have stabilized and slightly improved. Continue with the current treatment and recheck with another CT in 2 months. Follow-up in September to review the results of the CT scans   No orders of the defined types were placed in this encounter.  The patient has a good understanding of the overall plan. she agrees with it. she will call with any problems that may develop before the next visit here. Total time spent: 30 mins including face to face time and time spent for planning, charting and co-ordination of care   Viinay K Dushaun Okey, MD 04/14/24

## 2024-04-15 LAB — CANCER ANTIGEN 27.29: CA 27.29: 51.6 U/mL — ABNORMAL HIGH (ref 0.0–38.6)

## 2024-04-18 ENCOUNTER — Other Ambulatory Visit: Payer: Self-pay

## 2024-04-18 ENCOUNTER — Other Ambulatory Visit (HOSPITAL_COMMUNITY): Payer: Self-pay

## 2024-05-09 ENCOUNTER — Ambulatory Visit: Payer: BC Managed Care – PPO | Admitting: Hematology and Oncology

## 2024-05-11 ENCOUNTER — Other Ambulatory Visit: Payer: Self-pay

## 2024-05-13 ENCOUNTER — Other Ambulatory Visit: Payer: Self-pay

## 2024-05-13 ENCOUNTER — Other Ambulatory Visit (HOSPITAL_COMMUNITY): Payer: Self-pay

## 2024-05-13 NOTE — Progress Notes (Signed)
 Specialty Pharmacy Refill Coordination Note  Spoke with [name] is a 63 y.o. female contacted today regarding refills of specialty medication(s) Abemaciclib  (VERZENIO )  Doses on hand: 6 tablets (3 days)   Patient requested: Pickup at [facility] [place] date: 05/16/24  Medication will be filled on 05/13/24.

## 2024-05-19 ENCOUNTER — Ambulatory Visit (INDEPENDENT_AMBULATORY_CARE_PROVIDER_SITE_OTHER): Admitting: Otolaryngology

## 2024-05-19 ENCOUNTER — Encounter (INDEPENDENT_AMBULATORY_CARE_PROVIDER_SITE_OTHER): Payer: Self-pay | Admitting: Otolaryngology

## 2024-05-19 VITALS — BP 120/81 | HR 92 | Ht 74.0 in | Wt 284.0 lb

## 2024-05-19 DIAGNOSIS — S01312A Laceration without foreign body of left ear, initial encounter: Secondary | ICD-10-CM

## 2024-05-20 ENCOUNTER — Institutional Professional Consult (permissible substitution) (INDEPENDENT_AMBULATORY_CARE_PROVIDER_SITE_OTHER): Admitting: Otolaryngology

## 2024-05-21 NOTE — Progress Notes (Signed)
 CC: Left earlobe laceration  HPI:  [name] is a 63 y.o. female who presents today for evaluation of her left earlobe laceration.  According to the patient, she accidentally ripped off her left earring, resulting in a through and through laceration of her earlobe.  The patient had a similar injury on the right side, requiring surgical repair.  She has no other otologic surgery.  She denies any hearing difficulty.  Past Medical History:  Diagnosis Date   Complication of anesthesia    PONV   Fatigue    GERD (gastroesophageal reflux disease)    w nonobstructing esophageal stricture   Headache    Heat intolerance    History of ETT 03/2009   myoview EF 65%, normal wall motion, no ischemia or infarction, poor exercise capacity   HTN (hypertension)    left breast ca dx'd 10/2018   breast- left   Motion sickness    Normal echocardiogram 03/2009   EF 60-65% moderate diastolic dysfunction, mild LAE   Numbness and tingling    Obesity    PONV (postoperative nausea and vomiting)    S/P partial hysterectomy     Past Surgical History:  Procedure Laterality Date   ABDOMINAL HYSTERECTOMY  2007   partial    AXILLARY LYMPH NODE DISSECTION Left 03/11/2019   Procedure: LEFT AXILLARY LYMPH NODE DISSECTION;  Surgeon: Vanderbilt Ned, MD;  Location: [facility];  Service: General;  Laterality: Left;   BREAST LUMPECTOMY WITH RADIOACTIVE SEED AND SENTINEL LYMPH NODE BIOPSY Left 02/22/2019   Procedure: LEFT BREAST LUMPECTOMY x2 WITH RADIOACTIVE SEED AND LEFT AXILLA SEED  GUIDED LYMPH NODE BIOPSY AND LEFT SENTINEL LYMPH NODE MAPPING;  Surgeon: Vanderbilt Ned, MD;  Location: [facility];  Service: General;  Laterality: Left;   BRONCHIAL BIOPSY  08/10/2023   Procedure: BRONCHIAL BIOPSIES;  Surgeon: Brenna Adine CROME, DO;  Location: [facility];  Service: Pulmonary;;   CARPAL TUNNEL RELEASE Bilateral 2018   FOOT SURGERY Bilateral 2016   hysterectomy (other)      PORT-A-CATH REMOVAL N/A 02/22/2019   Procedure: REMOVAL PORT-A-CATH;  Surgeon: Vanderbilt Ned, MD;  Location: [facility];  Service: General;  Laterality: N/A;   PORTACATH PLACEMENT N/A 11/10/2018   Procedure: INSERTION PORT-A-CATH WITH ULTRASOUND;  Surgeon: Vanderbilt Ned, MD;  Location: [facility];  Service: General;  Laterality: N/A;   TONSILLECTOMY  1968   UPPER GASTROINTESTINAL ENDOSCOPY      Family History  Problem Relation Age of Onset   Heart attack Mother 30   Heart disease Mother    Diabetes Sister    Heart attack Maternal Grandmother        age 40 or 69   Heart disease Maternal Grandmother    Neuropathy Neg Hx    Colon cancer Neg Hx    Colon polyps Neg Hx    Esophageal cancer Neg Hx    Stomach cancer Neg Hx    Rectal cancer Neg Hx     Social History:  reports that she has never smoked. She has never been exposed to tobacco smoke. She has never used smokeless tobacco. She reports that she does not drink alcohol and does not use drugs.  Allergies: No Known Allergies  Prior to Admission medications   Medication Sig Start Date End Date Taking? Authorizing Provider  abemaciclib  (VERZENIO ) 100 MG tablet Take 1 tablet (100 mg total) by mouth 2 (two) times daily. 03/15/24  Yes Gudena, Vinay, MD  atorvastatin  (LIPITOR) 10 MG  tablet Take 1 tablet by mouth daily.   Yes [provider]  carvedilol  (COREG ) 6.25 MG tablet TAKE 1 TABLET BY MOUTH TWICE DAILY Patient taking differently: Take 6.25 mg by mouth 2 (two) times daily with a meal. 03/03/15  Yes Rudy Carlin LABOR, MD  diclofenac  Sodium (VOLTAREN ) 1 % GEL Apply 4 g topically 4 (four) times daily. 09/17/23  Yes McDonald, Juliene SAUNDERS, DPM  diphenoxylate -atropine  (LOMOTIL ) 2.5-0.025 MG tablet Take 1 tablet by mouth 4 (four) times daily as needed for diarrhea or loose stools. 01/19/24  Yes Gudena, Vinay, MD  letrozole  (FEMARA ) 2.5 MG tablet Take 1 tablet (2.5 mg total) by mouth daily. 08/14/23  Yes Gudena, Vinay, MD   naproxen  (NAPROSYN ) 500 MG tablet Take 500 mg by mouth daily as needed for moderate pain (pain score 4-6).   Yes [provider]  pantoprazole  (PROTONIX ) 40 MG tablet TAKE 1 TABLET BY MOUTH EVERY DAY 04/11/24  Yes Gudena, Vinay, MD  Vitamin D , Ergocalciferol , (DRISDOL ) 1.25 MG (50000 UNIT) CAPS capsule TAKE 1 CAPSULE (50,000 UNITS TOTAL) BY MOUTH EVERY 7 (SEVEN) DAYS 09/28/23  Yes McDonald, Juliene SAUNDERS, DPM  lisinopril -hydrochlorothiazide  (ZESTORETIC ) 10-12.5 MG tablet Take 1 tablet by mouth daily. Patient not taking: Reported on 05/19/2024    [provider]  ondansetron  (ZOFRAN ) 4 MG tablet TAKE 1 TABLET BY MOUTH EVERY 8 HOURS AS NEEDED FOR NAUSEA AND VOMITING Patient not taking: Reported on 05/19/2024 12/14/23   Odean Potts, MD    Blood pressure 120/81, pulse 92, height 6' 2 (1.88 m), weight 284 lb (128.8 kg), SpO2 (!) 86%. Exam: General: Communicates without difficulty, well nourished, no acute distress. Head: Normocephalic, no evidence injury, no tenderness, facial buttresses intact without stepoff. Face/sinus: No tenderness to palpation and percussion. Facial movement is normal and symmetric. Eyes: PERRL, EOMI. No scleral icterus, conjunctivae clear. Neuro: CN II exam reveals vision grossly intact.  No nystagmus at any point of gaze. Ears: Auricles well formed without lesions.  A through and through 2 cm laceration is noted on her left earlobe.  Ear canals are intact without mass or lesion.  No erythema or edema is appreciated.  The TMs are intact without fluid. Nose: External evaluation reveals normal support and skin without lesions.  Dorsum is intact.  Anterior rhinoscopy reveals normal mucosa over anterior aspect of inferior turbinates and intact septum.  No purulence noted. Oral:  Oral cavity and oropharynx are intact, symmetric, without erythema or edema.  Mucosa is moist without lesions. Neck: Full range of motion without pain.  There is no significant lymphadenopathy.  No masses  palpable.  Thyroid  bed within normal limits to palpation.  Parotid glands and submandibular glands equal bilaterally without mass.  Trachea is midline. Neuro:  CN 2-12 grossly intact.   Assessment: 1.  The patient has a through-and-through 2 cm left earlobe laceration, secondary to accidentally ripping off her left earring. 2.  The rest of her ENT exam is normal.  Plan: 1.  The physical exam findings are reviewed with the patient. 2.  Based on the above findings, the patient will benefit from surgical repair of her left earlobe laceration.  The risk, benefits, and details of the procedure are discussed. 3.  The patient would like to proceed with the procedure.  We will schedule her for the procedure under local anesthesia in my office.  Glendene Wyer W Nicolle Heward 05/21/2024, 9:44 AM

## 2024-05-25 ENCOUNTER — Encounter (INDEPENDENT_AMBULATORY_CARE_PROVIDER_SITE_OTHER): Payer: Self-pay | Admitting: Otolaryngology

## 2024-05-25 ENCOUNTER — Ambulatory Visit (INDEPENDENT_AMBULATORY_CARE_PROVIDER_SITE_OTHER): Admitting: Otolaryngology

## 2024-05-25 VITALS — BP 115/81 | HR 82

## 2024-05-25 DIAGNOSIS — S01312A Laceration without foreign body of left ear, initial encounter: Secondary | ICD-10-CM

## 2024-05-25 HISTORY — PX: OTHER SURGICAL HISTORY: SHX169

## 2024-05-25 NOTE — Progress Notes (Signed)
 Patient ID: [name], female   DOB: [birth year], 63 y.o.   MRN: [id number]  Procedure: Complex repair of the 2cm left earlobe laceration  Indication: A through-and-through laceration of the left earlobe.  Anesthesia: Local anesthesia with 1% lidocaine  with 1:100,000 epinephrine   Description: The patient is placed supine on the exam table. The left auricle is prepped and draped in a sterile fashion.  After adequate local anesthesia is achieved, the laceration site and debrided.  Due to the asymmetry of earlobe pieces, a rotational flap is fashioned from the soft tissue superior to the laceration site.  The laceration is closed in layers with interrupted sutures.  The patient tolerated the procedure well.  Follow up: The patient will return in 1 week for suture removal.

## 2024-06-03 ENCOUNTER — Ambulatory Visit (INDEPENDENT_AMBULATORY_CARE_PROVIDER_SITE_OTHER): Admitting: Otolaryngology

## 2024-06-03 ENCOUNTER — Encounter (INDEPENDENT_AMBULATORY_CARE_PROVIDER_SITE_OTHER): Payer: Self-pay | Admitting: Otolaryngology

## 2024-06-03 ENCOUNTER — Other Ambulatory Visit: Payer: Self-pay

## 2024-06-03 VITALS — HR 82

## 2024-06-03 DIAGNOSIS — S01312A Laceration without foreign body of left ear, initial encounter: Secondary | ICD-10-CM

## 2024-06-03 DIAGNOSIS — Z9889 Other specified postprocedural states: Secondary | ICD-10-CM

## 2024-06-03 NOTE — Progress Notes (Signed)
 No issue postop.  The laceration site is healing well.  The sutures are removed without difficulty.  Follow-up as needed.

## 2024-06-07 ENCOUNTER — Other Ambulatory Visit (HOSPITAL_COMMUNITY): Payer: Self-pay

## 2024-06-09 ENCOUNTER — Other Ambulatory Visit (HOSPITAL_COMMUNITY): Payer: Self-pay

## 2024-06-14 ENCOUNTER — Other Ambulatory Visit (HOSPITAL_COMMUNITY): Payer: Self-pay

## 2024-06-14 ENCOUNTER — Other Ambulatory Visit: Payer: Self-pay

## 2024-06-14 NOTE — Progress Notes (Signed)
 Clinical Intervention Note  Clinical Intervention Notes: Patient reported starting magnesium, vitamin D , and Centrum Silver for Women. No DDIs identified with Verzenio    Clinical Intervention Outcomes: Prevention of an adverse drug event   Advertising account planner

## 2024-06-14 NOTE — Progress Notes (Signed)
 Specialty Pharmacy Refill Coordination Note  [name] [name] is a 63 y.o. female contacted today regarding refills of specialty medication(s) Abemaciclib  (VERZENIO )   Patient requested [name] at [facility] at [place] date: 06/14/24   Medication will be filled on 06/14/24.

## 2024-06-15 ENCOUNTER — Telehealth: Payer: Self-pay

## 2024-06-15 ENCOUNTER — Ambulatory Visit (HOSPITAL_COMMUNITY)
Admission: RE | Admit: 2024-06-15 | Discharge: 2024-06-15 | Disposition: A | Discharge status: Home / Self Care | Source: Ambulatory Visit | Attending: Hematology and Oncology | Admitting: Hematology and Oncology

## 2024-06-15 DIAGNOSIS — C50412 Malignant neoplasm of upper-outer quadrant of left female breast: Secondary | ICD-10-CM | POA: Insufficient documentation

## 2024-06-15 DIAGNOSIS — Z170 Estrogen receptor positive status [ER+]: Secondary | ICD-10-CM | POA: Insufficient documentation

## 2024-06-15 MED ORDER — IOHEXOL 300 MG/ML  SOLN
100.0000 mL | Freq: Once | INTRAMUSCULAR | Status: AC | PRN
  Administered 2024-06-15: 100 mL via INTRAVENOUS

## 2024-06-15 MED ORDER — HEPARIN SOD (PORK) LOCK FLUSH 100 UNIT/ML IV SOLN: 500.0000 [IU] | Freq: Once | INTRAVENOUS | Status: DC

## 2024-06-15 NOTE — Telephone Encounter (Signed)
 Notified the pt regarding her forms being completed and ready for pick up. Pt stated that she will pick up today. No questions or concerns to be noted.

## 2024-06-17 ENCOUNTER — Telehealth: Payer: Self-pay | Admitting: Cardiology

## 2024-06-17 NOTE — Telephone Encounter (Signed)
 Patient walked in and is requesting medical records going back to her New Patient visit with Ganji in 2021.   Please call to discuss.

## 2024-06-20 ENCOUNTER — Telehealth: Payer: Self-pay

## 2024-06-20 ENCOUNTER — Telehealth: Payer: Self-pay | Admitting: Cardiology

## 2024-06-20 NOTE — Telephone Encounter (Signed)
 S/w pt today as she called to find out if we can provide documentation of her visits from 2022-2025. Advised pt she can access this via MyChart and save the file to print. Pt was instructed how to do this and verbalized thanks. She will come in to see Dr Odean 06/22/24.

## 2024-06-20 NOTE — Telephone Encounter (Signed)
 Telephone encounter created in error - Please disregard this encounter.

## 2024-06-21 ENCOUNTER — Other Ambulatory Visit: Payer: Self-pay

## 2024-06-21 NOTE — Progress Notes (Signed)
 Specialty Pharmacy Ongoing Clinical Assessment Note  [name] is a 63 y.o. female who is being followed by the specialty pharmacy service for RxSp Oncology   Patient's specialty medication(s) reviewed today: Abemaciclib  (VERZENIO )   Missed doses in the last 4 weeks: 0   Patient/Caregiver did not have any additional questions or concerns.   Therapeutic benefit summary: Patient is achieving benefit   Adverse events/side effects summary: Experienced adverse events/side effects (mild nausea, ongoing, tolerable at this time)   Patient's therapy is appropriate to: Continue    Goals Addressed             This Visit's Progress    Maintain optimal adherence to therapy   On track    Patient is on track. Patient will maintain adherence.  Tumor marker has come back down and was most recently 51.6 on 04/14/24.         Follow up: 3 months  Silvano LOISE Dolly Specialty Pharmacist

## 2024-06-22 ENCOUNTER — Inpatient Hospital Stay (HOSPITAL_BASED_OUTPATIENT_CLINIC_OR_DEPARTMENT_OTHER): Admitting: Hematology and Oncology

## 2024-06-22 ENCOUNTER — Inpatient Hospital Stay: Attending: Hematology and Oncology

## 2024-06-22 VITALS — BP 123/84 | HR 81 | Temp 97.6°F | Resp 17 | Wt 290.8 lb

## 2024-06-22 DIAGNOSIS — C773 Secondary and unspecified malignant neoplasm of axilla and upper limb lymph nodes: Secondary | ICD-10-CM | POA: Insufficient documentation

## 2024-06-22 DIAGNOSIS — Z170 Estrogen receptor positive status [ER+]: Secondary | ICD-10-CM | POA: Insufficient documentation

## 2024-06-22 DIAGNOSIS — Z1721 Progesterone receptor positive status: Secondary | ICD-10-CM | POA: Diagnosis not present

## 2024-06-22 DIAGNOSIS — D701 Agranulocytosis secondary to cancer chemotherapy: Secondary | ICD-10-CM | POA: Diagnosis not present

## 2024-06-22 DIAGNOSIS — E669 Obesity, unspecified: Secondary | ICD-10-CM | POA: Insufficient documentation

## 2024-06-22 DIAGNOSIS — C50412 Malignant neoplasm of upper-outer quadrant of left female breast: Secondary | ICD-10-CM | POA: Insufficient documentation

## 2024-06-22 DIAGNOSIS — Z79811 Long term (current) use of aromatase inhibitors: Secondary | ICD-10-CM | POA: Diagnosis not present

## 2024-06-22 DIAGNOSIS — Z79899 Other long term (current) drug therapy: Secondary | ICD-10-CM | POA: Insufficient documentation

## 2024-06-22 DIAGNOSIS — N179 Acute kidney failure, unspecified: Secondary | ICD-10-CM | POA: Insufficient documentation

## 2024-06-22 DIAGNOSIS — K529 Noninfective gastroenteritis and colitis, unspecified: Secondary | ICD-10-CM | POA: Diagnosis not present

## 2024-06-22 DIAGNOSIS — C7800 Secondary malignant neoplasm of unspecified lung: Secondary | ICD-10-CM | POA: Insufficient documentation

## 2024-06-22 DIAGNOSIS — Z1732 Human epidermal growth factor receptor 2 negative status: Secondary | ICD-10-CM | POA: Diagnosis not present

## 2024-06-22 LAB — CBC WITH DIFFERENTIAL (CANCER CENTER ONLY)
Abs Immature Granulocytes: 0.01 K/uL (ref 0.00–0.07)
Basophils Absolute: 0 K/uL (ref 0.0–0.1)
Basophils Relative: 1 %
Eosinophils Absolute: 0.1 K/uL (ref 0.0–0.5)
Eosinophils Relative: 2 %
HCT: 33.1 % — ABNORMAL LOW (ref 36.0–46.0)
Hemoglobin: 11.1 g/dL — ABNORMAL LOW (ref 12.0–15.0)
Immature Granulocytes: 0 %
Lymphocytes Relative: 36 %
Lymphs Abs: 1.1 K/uL (ref 0.7–4.0)
MCH: 30.7 pg (ref 26.0–34.0)
MCHC: 33.5 g/dL (ref 30.0–36.0)
MCV: 91.4 fL (ref 80.0–100.0)
Monocytes Absolute: 0.2 K/uL (ref 0.1–1.0)
Monocytes Relative: 7 %
Neutro Abs: 1.6 K/uL — ABNORMAL LOW (ref 1.7–7.7)
Neutrophils Relative %: 54 %
Platelet Count: 145 K/uL — ABNORMAL LOW (ref 150–400)
RBC: 3.62 MIL/uL — ABNORMAL LOW (ref 3.87–5.11)
RDW: 12 % (ref 11.5–15.5)
Smear Review: NORMAL
WBC Count: 3.1 K/uL — ABNORMAL LOW (ref 4.0–10.5)
nRBC: 0 % (ref 0.0–0.2)

## 2024-06-22 LAB — CMP (CANCER CENTER ONLY)
ALT: 13 U/L (ref 0–44)
AST: 15 U/L (ref 15–41)
Albumin: 4 g/dL (ref 3.5–5.0)
Alkaline Phosphatase: 67 U/L (ref 38–126)
Anion gap: 4 — ABNORMAL LOW (ref 5–15)
BUN: 22 mg/dL (ref 8–23)
CO2: 30 mmol/L (ref 22–32)
Calcium: 9.5 mg/dL (ref 8.9–10.3)
Chloride: 106 mmol/L (ref 98–111)
Creatinine: 1.55 mg/dL — ABNORMAL HIGH (ref 0.44–1.00)
GFR, Estimated: 37 mL/min — ABNORMAL LOW (ref >60)
Glucose, Bld: 102 mg/dL — ABNORMAL HIGH (ref 70–99)
Potassium: 3.9 mmol/L (ref 3.5–5.1)
Sodium: 140 mmol/L (ref 135–145)
Total Bilirubin: 0.4 mg/dL (ref 0.0–1.2)
Total Protein: 7.2 g/dL (ref 6.5–8.1)

## 2024-06-22 NOTE — Progress Notes (Signed)
 Patient Care Team: Cristopher Suzen HERO, NP as PCP - General Ladona Heinz, MD as PCP - Cardiology (Cardiology) Vanderbilt Ned, MD as Consulting Physician (General Surgery) Odean Potts, MD as Consulting Physician (Hematology and Oncology) Dewey Rush, MD as Consulting Physician (Radiation Oncology)  DIAGNOSIS:  Encounter Diagnosis  Name Primary?   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, lymph node biopsy positive, intramammary lymph node biopsy negative, ER 100%, PR 100%, Ki-67 20%, HER-2 1+ by IHC, T2 and 1 stage 2A   10/27/2018 Cancer Staging   Staging form: Breast, AJCC 8th Edition - Clinical: Stage IIA (cT2, cN1(f), cM0, G1, ER+, PR+, HER2-) - Signed by Odean Potts, MD on 11/01/2018   11/11/2018 - 12/20/2018 Neo-Adjuvant Chemotherapy   Neoadjuvant chemotherapy with dose dense Adriamycin  and Cytoxan  every 2 weeks x4 followed by Taxol  weekly x12 (stopped after cycle 3 due to severe neuropathy)   02/01/2019 Breast MRI   Known malignancy in left breast measures 4.7 x 3.5 x 7.2cm, previously 5 x 3 x 10cm.    02/22/2019 Surgery   Left lumpectomy ([facility]): IDC with DCIS, 5.8cm, HER2 negative, ER 90%, PR 90%, Ki67 2%, clear margins,3/5 LN positive for malignancy.    03/02/2019 Cancer Staging   Staging form: Breast, AJCC 8th Edition - Pathologic stage from 03/02/2019: No Stage Recommended (ypT3, pN1a, cM0, G2, ER+, PR+, HER2-) - Signed by Odean Potts, MD on 03/02/2019   03/11/2019 Surgery   Left axillary lymph node dissection  ([facility]): metastatic carcinoma in 3/14 lymph nodes, focal extranodal involvement by tumor.   04/20/2019 - 06/02/2019 Radiation Therapy   Adjuvant radiation therapy   05/30/2019 -  Anti-estrogen oral therapy   Anastrozole  1 mg daily     CHIEF COMPLIANT: Follow-up to review the results of CT scans  HISTORY OF PRESENT ILLNESS:   History of Present Illness [name] is a 63 year old female with lung nodules who presents for follow-up on her condition.  Her lung nodules remain stable, with the largest nodule the size of a rice grain. There are no new nodules, lymphadenopathy, or involvement near bones or breast.  She experiences gastrointestinal side effects from Verzenio , occurring once or twice a week, which she manages by adjusting meal timing. She has not required Imodium  recently.  She is addressing weight gain concerns, currently weighing 290 pounds, and plans to start using amino acid drops for weight management. Her medications are managed through a specialty pharmacy without issues.     ALLERGIES:  has no known allergies.  MEDICATIONS:  Current Outpatient Medications  Medication Sig Dispense Refill   abemaciclib  (VERZENIO ) 100 MG tablet Take 1 tablet (100 mg total) by mouth 2 (two) times daily. 56 tablet 5   atorvastatin  (LIPITOR) 10 MG tablet Take 1 tablet by mouth daily.     carvedilol  (COREG ) 6.25 MG tablet TAKE 1 TABLET BY MOUTH TWICE DAILY 180 tablet 1   diclofenac  Sodium (VOLTAREN ) 1 % GEL Apply 4 g topically 4 (four) times daily. 100 g 4   diphenoxylate -atropine  (LOMOTIL ) 2.5-0.025 MG tablet Take 1 tablet by mouth 4 (  four) times daily as needed for diarrhea or loose stools. 30 tablet 1   letrozole  (FEMARA ) 2.5 MG tablet Take 1 tablet (2.5 mg total) by mouth daily. 90 tablet 3   [Paused] lisinopril -hydrochlorothiazide  (ZESTORETIC ) 10-12.5 MG tablet Take 1 tablet by mouth daily.     naproxen  (NAPROSYN ) 500 MG tablet Take 500 mg by mouth daily as needed for  moderate pain (pain score 4-6).     pantoprazole  (PROTONIX ) 40 MG tablet TAKE 1 TABLET BY MOUTH EVERY DAY 30 tablet 5   Vitamin D , Ergocalciferol , (DRISDOL ) 1.25 MG (50000 UNIT) CAPS capsule TAKE 1 CAPSULE (50,000 UNITS TOTAL) BY MOUTH EVERY 7 (SEVEN) DAYS 12 capsule 1   ondansetron  (ZOFRAN ) 4 MG tablet TAKE 1 TABLET BY MOUTH EVERY 8 HOURS AS NEEDED FOR NAUSEA AND VOMITING (Patient not taking: Reported on 06/22/2024) 18 tablet 1   No current facility-administered medications for this visit.   Facility-Administered Medications Ordered in Other Visits  Medication Dose Route Frequency Provider Last Rate Last Admin   gadopentetate dimeglumine  (MAGNEVIST ) injection 20 mL  20 mL Intravenous Once PRN Ahern, Antonia B, MD       gadopentetate dimeglumine  (MAGNEVIST ) injection 20 mL  20 mL Intravenous Once PRN Ines Onetha NOVAK, MD        PHYSICAL EXAMINATION: ECOG PERFORMANCE STATUS: 1 - Symptomatic but completely ambulatory  Vitals:   06/22/24 0842  BP: 123/84  Pulse: 81  Resp: 17  Temp: 97.6 F (36.4 C)  SpO2: 97%   Filed Weights   06/22/24 0842  Weight: 290 lb 12.8 oz (131.9 kg)    Physical Exam MEASUREMENTS: Weight- 290.  (exam performed in the presence of a chaperone)  LABORATORY DATA:  I have reviewed the data as listed    Latest Ref Rng & Units 06/22/2024    8:17 AM 04/14/2024    8:05 AM 03/14/2024    9:18 AM  CMP  Glucose 70 - 99 mg/dL 897  99  90   BUN 8 - 23 mg/dL 22  14  20    Creatinine 0.44 - 1.00 mg/dL 8.44  8.87  8.79   Sodium 135 - 145 mmol/L 140  142  142   Potassium 3.5 - 5.1 mmol/L 3.9  4.2  4.3   Chloride 98 - 111 mmol/L 106  107  107   CO2 22 - 32 mmol/L 30  31  31    Calcium  8.9 - 10.3 mg/dL 9.5  9.6  9.9   Total Protein 6.5 - 8.1 g/dL 7.2  6.7  7.3   Total Bilirubin 0.0 - 1.2 mg/dL 0.4  0.5  0.6   Alkaline Phos 38 - 126 U/L 67  61  67   AST 15 - 41 U/L 15  14  15    ALT 0 - 44 U/L 13  13  12      Lab Results  Component Value Date   WBC 3.1 (L) 06/22/2024    HGB 11.1 (L) 06/22/2024   HCT 33.1 (L) 06/22/2024   MCV 91.4 06/22/2024   PLT 145 (L) 06/22/2024   NEUTROABS 1.6 (L) 06/22/2024    ASSESSMENT & PLAN:  Malignant neoplasm of upper-outer quadrant of left breast in female, estrogen receptor positive (HCC) Left lumpectomy: 02/22/2019:([facility]): IDC with DCIS, 5.8cm, margins negative, lymphovascular invasion present HER2 negative, ER 90%, PR 90%, Ki67 2%, clear margins,3/5 LN positive for malignancy.  AX L&D 03/11/2019: 3/14 lymph nodes positive with focal extranodal involvement [id number]   Treatment plan: 1.  Neoadjuvant chemotherapy with  dose dense Adriamycin  and Cytoxan  every 2 weeks x4 followed by Taxol  weekly x 3 discontinued due to severe peripheral neuropathy completed 01/20/2019 2.  Followed by mastectomy on 02/22/2019 3.  Followed by radiation 04/20/2019-06/02/2019 4.  Followed by antiestrogen therapy. 06/02/2019 5. 07/15/22: Lung nodules on CT scan. 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 PET CT 07/31/23: 3 small Rt pleural based nodules hypermetabolic, hypermet Rt hilar LN ----------------------------------------------------------------------------------- Current treatment: Letrozole  with Verzenio  started 08/14/2023 Guardant360 09/16/2023: No ESR 1 mutation.  MSI high not detected   Verzinio toxicities: Mild headaches Mild nausea Abdominal cramps: No diarrhea Slight leukopenia Mild thrombocytopenia Hospitalization 01/10/2024-01/12/2024: Diarrhea, dehydration, dizziness, bright red blood per rectum   Recommendation: tolerating the dosage of Verzinio to 100 mg p.o. twice daily We provided her with free samples of Verzinio today     CT CAP 02/23/2024: Similar pulmonary metastases. CT CAP 06/21/2024: Unchanged small bilateral pulmonary nodules Renal insufficiency: Encouraged her to drink more liquids and for the future we will do CT scans without contrast  Continue with the current treatment and recheck labs and follow-up in 3  months and another CT in 6 months.  (We will do the next CT scan without contrast because of the worsening renal function after the contrast) ------------------------------------- Assessment and Plan Assessment & Plan Metastatic breast cancer with stable lung metastases Lung nodules stable, no new metastases or lymphadenopathy. Cancer confined to lungs. Non-contrast CT considered for future imaging due to nephrotoxicity risk. - Continue current management. - Schedule blood work and tumor marker in three months. - Schedule scan, blood work, and tumor marker in six months. - Consider non-contrast CT for future scans.  Leukopenia secondary to cancer therapy White blood cell count stable between 2.9 and 3.3, consistent with therapy. - Continue monitoring blood counts.  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 - Continue current dietary adjustments and monitor symptoms.  Acute kidney injury likely contrast-induced Creatinine increased to 1.55, likely due to CT contrast. Aggressive hydration recommended. - Encourage aggressive hydration. - Consider non-contrast CT for future scans.  Obesity Weight at 290 pounds, above desired range. Collaborating with nutritionist, considering amino acid drops. - Continue working with a nutritionist for [employer].      Orders Placed This Encounter  Procedures   CBC with Differential (Cancer Center Only)    Standing Status:   Future    Expiration Date:   06/22/2025   CMP (Cancer Center only)    Standing Status:   Future    Expiration Date:   06/22/2025   CA 27.29    Standing Status:   Future    Expiration Date:   06/22/2025   The patient has a good understanding of the overall plan. she agrees with it. she will call with any problems that may develop before the next visit here. Total time spent: 30 mins including face to face time and time spent for planning, charting  and co-ordination of care   Naomi MARLA Chad, MD 06/22/24

## 2024-06-22 NOTE — Assessment & Plan Note (Signed)
 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  AX L&D 03/11/2019: 3/14 lymph nodes positive with focal extranodal involvement [id number]   Treatment plan: 1.  Neoadjuvant chemotherapy with dose dense Adriamycin  and Cytoxan  every 2 weeks x4 followed by Taxol  weekly x 3 discontinued due to severe peripheral neuropathy completed 01/20/2019 2.  Followed by mastectomy on 02/22/2019 3.  Followed by radiation 04/20/2019-06/02/2019 4.  Followed by antiestrogen therapy. 06/02/2019 5. 07/15/22: Lung nodules on CT scan. 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 PET CT 07/31/23: 3 small Rt pleural based nodules hypermetabolic, hypermet Rt hilar LN ----------------------------------------------------------------------------------- Current treatment: Letrozole  with Verzenio  started 08/14/2023 Guardant360 09/16/2023: No ESR 1 mutation.  MSI high not detected   Verzinio toxicities: Mild headaches Mild nausea Abdominal cramps: No diarrhea Slight leukopenia Mild thrombocytopenia Hospitalization 01/10/2024-01/12/2024: Diarrhea, dehydration, dizziness, bright red blood per rectum   Recommendation: tolerating the dosage of Verzinio to 100 mg p.o. twice daily We provided her with free samples of Verzinio today   Slight increase in tumor markers:  CT CAP 02/23/2024: Similar pulmonary metastases. CT CAP 06/21/2024: Unchanged small bilateral pulmonary nodules  Continue with the current treatment and recheck with another CT in 3-4 months.

## 2024-06-23 LAB — CANCER ANTIGEN 27.29: CA 27.29: 54.9 U/mL — ABNORMAL HIGH (ref 0.0–38.6)

## 2024-06-23 NOTE — Telephone Encounter (Signed)
 Called and spoke with patient. Patient states she needs copy of her medical records going back to new patient visit with Dr. Ladona in 2021. Advised patient that she will need to contact medication records. Provided patient with phone number. Patient verbalized understanding and agreeable to plan.

## 2024-06-27 ENCOUNTER — Encounter (HOSPITAL_COMMUNITY): Payer: Self-pay | Admitting: Emergency Medicine

## 2024-06-27 ENCOUNTER — Inpatient Hospital Stay (HOSPITAL_COMMUNITY)
Admit: 2024-06-27 | Discharge: 2024-06-27 | Disposition: A | Discharge status: Home / Self Care | Attending: Internal Medicine

## 2024-06-27 ENCOUNTER — Emergency Department (HOSPITAL_COMMUNITY)

## 2024-06-27 ENCOUNTER — Other Ambulatory Visit: Payer: Self-pay

## 2024-06-27 ENCOUNTER — Inpatient Hospital Stay (HOSPITAL_COMMUNITY)
Admission: EM | Admit: 2024-06-27 | Discharge: 2024-07-01 | DRG: 606 | Disposition: A | Discharge status: Home / Self Care | Attending: Internal Medicine | Admitting: Internal Medicine

## 2024-06-27 ENCOUNTER — Inpatient Hospital Stay (HOSPITAL_COMMUNITY)

## 2024-06-27 DIAGNOSIS — R4701 Aphasia: Secondary | ICD-10-CM | POA: Diagnosis present

## 2024-06-27 DIAGNOSIS — Z6839 Body mass index (BMI) 39.0-39.9, adult: Secondary | ICD-10-CM | POA: Diagnosis not present

## 2024-06-27 DIAGNOSIS — R569 Unspecified convulsions: Secondary | ICD-10-CM | POA: Diagnosis not present

## 2024-06-27 DIAGNOSIS — C50412 Malignant neoplasm of upper-outer quadrant of left female breast: Secondary | ICD-10-CM | POA: Diagnosis present

## 2024-06-27 DIAGNOSIS — R112 Nausea with vomiting, unspecified: Secondary | ICD-10-CM | POA: Diagnosis not present

## 2024-06-27 DIAGNOSIS — N183 Chronic kidney disease, stage 3 unspecified: Secondary | ICD-10-CM | POA: Insufficient documentation

## 2024-06-27 DIAGNOSIS — L0390 Cellulitis, unspecified: Secondary | ICD-10-CM | POA: Insufficient documentation

## 2024-06-27 DIAGNOSIS — R29818 Other symptoms and signs involving the nervous system: Secondary | ICD-10-CM | POA: Diagnosis not present

## 2024-06-27 DIAGNOSIS — I10 Essential (primary) hypertension: Secondary | ICD-10-CM | POA: Diagnosis not present

## 2024-06-27 DIAGNOSIS — A419 Sepsis, unspecified organism: Secondary | ICD-10-CM | POA: Diagnosis present

## 2024-06-27 DIAGNOSIS — I129 Hypertensive chronic kidney disease with stage 1 through stage 4 chronic kidney disease, or unspecified chronic kidney disease: Secondary | ICD-10-CM | POA: Diagnosis present

## 2024-06-27 DIAGNOSIS — Z170 Estrogen receptor positive status [ER+]: Secondary | ICD-10-CM

## 2024-06-27 DIAGNOSIS — G9341 Metabolic encephalopathy: Secondary | ICD-10-CM | POA: Diagnosis present

## 2024-06-27 DIAGNOSIS — K5900 Constipation, unspecified: Secondary | ICD-10-CM | POA: Diagnosis not present

## 2024-06-27 DIAGNOSIS — Z8249 Family history of ischemic heart disease and other diseases of the circulatory system: Secondary | ICD-10-CM

## 2024-06-27 DIAGNOSIS — E785 Hyperlipidemia, unspecified: Secondary | ICD-10-CM | POA: Diagnosis present

## 2024-06-27 DIAGNOSIS — R509 Fever, unspecified: Secondary | ICD-10-CM

## 2024-06-27 DIAGNOSIS — S80861A Insect bite (nonvenomous), right lower leg, initial encounter: Principal | ICD-10-CM | POA: Diagnosis present

## 2024-06-27 DIAGNOSIS — N179 Acute kidney failure, unspecified: Secondary | ICD-10-CM | POA: Diagnosis present

## 2024-06-27 DIAGNOSIS — K5901 Slow transit constipation: Secondary | ICD-10-CM | POA: Diagnosis not present

## 2024-06-27 DIAGNOSIS — G929 Unspecified toxic encephalopathy: Secondary | ICD-10-CM

## 2024-06-27 DIAGNOSIS — W57XXXA Bitten or stung by nonvenomous insect and other nonvenomous arthropods, initial encounter: Secondary | ICD-10-CM | POA: Diagnosis present

## 2024-06-27 DIAGNOSIS — Z79811 Long term (current) use of aromatase inhibitors: Secondary | ICD-10-CM

## 2024-06-27 DIAGNOSIS — N1831 Chronic kidney disease, stage 3a: Secondary | ICD-10-CM | POA: Diagnosis present

## 2024-06-27 DIAGNOSIS — R4182 Altered mental status, unspecified: Secondary | ICD-10-CM | POA: Diagnosis not present

## 2024-06-27 DIAGNOSIS — K219 Gastro-esophageal reflux disease without esophagitis: Secondary | ICD-10-CM | POA: Diagnosis present

## 2024-06-27 DIAGNOSIS — R6510 Systemic inflammatory response syndrome (SIRS) of non-infectious origin without acute organ dysfunction: Secondary | ICD-10-CM | POA: Diagnosis present

## 2024-06-27 DIAGNOSIS — R519 Headache, unspecified: Secondary | ICD-10-CM | POA: Diagnosis present

## 2024-06-27 DIAGNOSIS — Z90711 Acquired absence of uterus with remaining cervical stump: Secondary | ICD-10-CM | POA: Diagnosis not present

## 2024-06-27 DIAGNOSIS — W57XXXD Bitten or stung by nonvenomous insect and other nonvenomous arthropods, subsequent encounter: Secondary | ICD-10-CM | POA: Diagnosis not present

## 2024-06-27 DIAGNOSIS — G9340 Encephalopathy, unspecified: Principal | ICD-10-CM | POA: Diagnosis present

## 2024-06-27 DIAGNOSIS — E66812 Obesity, class 2: Secondary | ICD-10-CM | POA: Diagnosis present

## 2024-06-27 DIAGNOSIS — D61818 Other pancytopenia: Secondary | ICD-10-CM | POA: Diagnosis present

## 2024-06-27 DIAGNOSIS — S70361D Insect bite (nonvenomous), right thigh, subsequent encounter: Secondary | ICD-10-CM | POA: Diagnosis not present

## 2024-06-27 DIAGNOSIS — L03115 Cellulitis of right lower limb: Secondary | ICD-10-CM | POA: Diagnosis not present

## 2024-06-27 DIAGNOSIS — R404 Transient alteration of awareness: Secondary | ICD-10-CM

## 2024-06-27 LAB — CBC WITH DIFFERENTIAL/PLATELET
Abs Immature Granulocytes: 0.03 K/uL (ref 0.00–0.07)
Basophils Absolute: 0 K/uL (ref 0.0–0.1)
Basophils Relative: 0 %
Eosinophils Absolute: 0.1 K/uL (ref 0.0–0.5)
Eosinophils Relative: 1 %
HCT: 31.8 % — ABNORMAL LOW (ref 36.0–46.0)
Hemoglobin: 10.2 g/dL — ABNORMAL LOW (ref 12.0–15.0)
Immature Granulocytes: 1 %
Lymphocytes Relative: 10 %
Lymphs Abs: 0.4 K/uL — ABNORMAL LOW (ref 0.7–4.0)
MCH: 30.9 pg (ref 26.0–34.0)
MCHC: 32.1 g/dL (ref 30.0–36.0)
MCV: 96.4 fL (ref 80.0–100.0)
Monocytes Absolute: 0.2 K/uL (ref 0.1–1.0)
Monocytes Relative: 5 %
Neutro Abs: 3.8 K/uL (ref 1.7–7.7)
Neutrophils Relative %: 83 %
Platelets: 107 K/uL — ABNORMAL LOW (ref 150–400)
RBC: 3.3 MIL/uL — ABNORMAL LOW (ref 3.87–5.11)
RDW: 12.1 % (ref 11.5–15.5)
WBC: 4.5 K/uL (ref 4.0–10.5)
nRBC: 0 % (ref 0.0–0.2)

## 2024-06-27 LAB — CBC
HCT: 33.9 % — ABNORMAL LOW (ref 36.0–46.0)
Hemoglobin: 10.7 g/dL — ABNORMAL LOW (ref 12.0–15.0)
MCH: 30.1 pg (ref 26.0–34.0)
MCHC: 31.6 g/dL (ref 30.0–36.0)
MCV: 95.5 fL (ref 80.0–100.0)
Platelets: 137 K/uL — ABNORMAL LOW (ref 150–400)
RBC: 3.55 MIL/uL — ABNORMAL LOW (ref 3.87–5.11)
RDW: 12.1 % (ref 11.5–15.5)
WBC: 3.8 K/uL — ABNORMAL LOW (ref 4.0–10.5)
nRBC: 0 % (ref 0.0–0.2)

## 2024-06-27 LAB — COMPREHENSIVE METABOLIC PANEL WITH GFR
ALT: 19 U/L (ref 0–44)
AST: 23 U/L (ref 15–41)
Albumin: 4.2 g/dL (ref 3.5–5.0)
Alkaline Phosphatase: 81 U/L (ref 38–126)
Anion gap: 12 (ref 5–15)
BUN: 21 mg/dL (ref 8–23)
CO2: 24 mmol/L (ref 22–32)
Calcium: 9.6 mg/dL (ref 8.9–10.3)
Chloride: 100 mmol/L (ref 98–111)
Creatinine, Ser: 1.45 mg/dL — ABNORMAL HIGH (ref 0.44–1.00)
GFR, Estimated: 40 mL/min — ABNORMAL LOW (ref >60)
Glucose, Bld: 128 mg/dL — ABNORMAL HIGH (ref 70–99)
Potassium: 3.6 mmol/L (ref 3.5–5.1)
Sodium: 136 mmol/L (ref 135–145)
Total Bilirubin: 0.6 mg/dL (ref 0.0–1.2)
Total Protein: 7.3 g/dL (ref 6.5–8.1)

## 2024-06-27 LAB — URINE DRUG SCREEN
Amphetamines: NEGATIVE
Barbiturates: NEGATIVE
Benzodiazepines: NEGATIVE
Cocaine: NEGATIVE
Fentanyl: NEGATIVE
Methadone Scn, Ur: NEGATIVE
Opiates: NEGATIVE
Tetrahydrocannabinol: NEGATIVE

## 2024-06-27 LAB — I-STAT CHEM 8, ED
BUN: 22 mg/dL (ref 8–23)
Calcium, Ion: 1.19 mmol/L (ref 1.15–1.40)
Chloride: 102 mmol/L (ref 98–111)
Creatinine, Ser: 1.6 mg/dL — ABNORMAL HIGH (ref 0.44–1.00)
Glucose, Bld: 122 mg/dL — ABNORMAL HIGH (ref 70–99)
HCT: 32 % — ABNORMAL LOW (ref 36.0–46.0)
Hemoglobin: 10.9 g/dL — ABNORMAL LOW (ref 12.0–15.0)
Potassium: 3.7 mmol/L (ref 3.5–5.1)
Sodium: 137 mmol/L (ref 135–145)
TCO2: 25 mmol/L (ref 22–32)

## 2024-06-27 LAB — PROTIME-INR
INR: 1.2 (ref 0.8–1.2)
Prothrombin Time: 15.7 s — ABNORMAL HIGH (ref 11.4–15.2)

## 2024-06-27 LAB — BLOOD GAS, ARTERIAL
Acid-Base Excess: 1.5 mmol/L (ref 0.0–2.0)
Bicarbonate: 25.9 mmol/L (ref 20.0–28.0)
O2 Saturation: 97.4 %
Patient temperature: 37.8
pCO2 arterial: 40 mmHg (ref 32–48)
pH, Arterial: 7.42 (ref 7.35–7.45)
pO2, Arterial: 87 mmHg (ref 83–108)

## 2024-06-27 LAB — HEPATIC FUNCTION PANEL
ALT: 15 U/L (ref 0–44)
AST: 21 U/L (ref 15–41)
Albumin: 3.7 g/dL (ref 3.5–5.0)
Alkaline Phosphatase: 68 U/L (ref 38–126)
Bilirubin, Direct: 0.2 mg/dL (ref 0.0–0.2)
Indirect Bilirubin: 0.3 mg/dL (ref 0.3–0.9)
Total Bilirubin: 0.5 mg/dL (ref 0.0–1.2)
Total Protein: 6.3 g/dL — ABNORMAL LOW (ref 6.5–8.1)

## 2024-06-27 LAB — LACTIC ACID, PLASMA
Lactic Acid, Venous: 1.1 mmol/L (ref 0.5–1.9)
Lactic Acid, Venous: 1.2 mmol/L (ref 0.5–1.9)

## 2024-06-27 LAB — BASIC METABOLIC PANEL WITH GFR
Anion gap: 12 (ref 5–15)
BUN: 21 mg/dL (ref 8–23)
CO2: 22 mmol/L (ref 22–32)
Calcium: 9.1 mg/dL (ref 8.9–10.3)
Chloride: 104 mmol/L (ref 98–111)
Creatinine, Ser: 1.2 mg/dL — ABNORMAL HIGH (ref 0.44–1.00)
GFR, Estimated: 51 mL/min — ABNORMAL LOW (ref >60)
Glucose, Bld: 106 mg/dL — ABNORMAL HIGH (ref 70–99)
Potassium: 3.8 mmol/L (ref 3.5–5.1)
Sodium: 139 mmol/L (ref 135–145)

## 2024-06-27 LAB — RESP PANEL BY RT-PCR (RSV, FLU A&B, COVID)  RVPGX2
Influenza A by PCR: NEGATIVE
Influenza B by PCR: NEGATIVE
Resp Syncytial Virus by PCR: NEGATIVE
SARS Coronavirus 2 by RT PCR: NEGATIVE

## 2024-06-27 LAB — ETHANOL: Alcohol, Ethyl (B): <15 mg/dL (ref <15)

## 2024-06-27 LAB — CBG MONITORING, ED: Glucose-Capillary: 118 mg/dL — ABNORMAL HIGH (ref 70–99)

## 2024-06-27 LAB — TSH: TSH: 2.89 u[IU]/mL (ref 0.350–4.500)

## 2024-06-27 LAB — URINALYSIS, W/ REFLEX TO CULTURE (INFECTION SUSPECTED)
Bilirubin Urine: NEGATIVE
Glucose, UA: NEGATIVE mg/dL
Ketones, ur: NEGATIVE mg/dL
Leukocytes,Ua: NEGATIVE
Nitrite: NEGATIVE
Protein, ur: NEGATIVE mg/dL
Specific Gravity, Urine: 1.009 (ref 1.005–1.030)
pH: 6 (ref 5.0–8.0)

## 2024-06-27 LAB — GLUCOSE, CAPILLARY: Glucose-Capillary: 117 mg/dL — ABNORMAL HIGH (ref 70–99)

## 2024-06-27 LAB — LIPASE, BLOOD: Lipase: 25 U/L (ref 11–51)

## 2024-06-27 LAB — APTT: aPTT: 29 s (ref 24–36)

## 2024-06-27 LAB — MAGNESIUM: Magnesium: 1.9 mg/dL (ref 1.7–2.4)

## 2024-06-27 LAB — AMMONIA: Ammonia: 24 umol/L (ref 9–35)

## 2024-06-27 LAB — PRO BRAIN NATRIURETIC PEPTIDE: Pro Brain Natriuretic Peptide: 73.7 pg/mL (ref <300.0)

## 2024-06-27 LAB — CK: Total CK: 214 U/L (ref 38–234)

## 2024-06-27 LAB — MRSA NEXT GEN BY PCR, NASAL: MRSA by PCR Next Gen: NOT DETECTED

## 2024-06-27 MED ORDER — VANCOMYCIN HCL 1250 MG/250ML IV SOLN: 1250.0000 mg | INTRAVENOUS | Status: DC

## 2024-06-27 MED ORDER — LACTATED RINGERS IV BOLUS (SEPSIS)
1000.0000 mL | Freq: Once | INTRAVENOUS | Status: AC
  Administered 2024-06-27: 1000 mL via INTRAVENOUS

## 2024-06-27 MED ORDER — LETROZOLE 2.5 MG PO TABS
2.5000 mg | ORAL_TABLET | Freq: Every day | ORAL | Status: DC
  Administered 2024-06-27 – 2024-07-01 (×5): 2.5 mg via ORAL
  Filled 2024-06-27 (×5): qty 1

## 2024-06-27 MED ORDER — LABETALOL HCL 5 MG/ML IV SOLN
20.0000 mg | Freq: Once | INTRAVENOUS | Status: DC
Start: 2024-06-27 — End: 2024-06-27

## 2024-06-27 MED ORDER — VANCOMYCIN HCL 2000 MG/400ML IV SOLN
2000.0000 mg | Freq: Once | INTRAVENOUS | Status: AC
Start: 2024-06-27 — End: 2024-06-27
  Administered 2024-06-27: 2000 mg via INTRAVENOUS
  Filled 2024-06-27: qty 400

## 2024-06-27 MED ORDER — PANTOPRAZOLE SODIUM 40 MG PO TBEC
40.0000 mg | DELAYED_RELEASE_TABLET | Freq: Every day | ORAL | Status: DC
  Administered 2024-06-27 – 2024-07-01 (×5): 40 mg via ORAL
  Filled 2024-06-27 (×5): qty 1

## 2024-06-27 MED ORDER — METRONIDAZOLE 500 MG/100ML IV SOLN
500.0000 mg | Freq: Once | INTRAVENOUS | Status: AC
  Administered 2024-06-27: 500 mg via INTRAVENOUS
  Filled 2024-06-27: qty 100

## 2024-06-27 MED ORDER — LACTATED RINGERS IV SOLN: INTRAVENOUS | Status: DC

## 2024-06-27 MED ORDER — ORAL CARE MOUTH RINSE: 15.0000 mL | OROMUCOSAL | Status: DC | PRN

## 2024-06-27 MED ORDER — SODIUM CHLORIDE 0.9 % IV SOLN: 2.0000 g | Freq: Two times a day (BID) | INTRAVENOUS | Status: DC

## 2024-06-27 MED ORDER — ENOXAPARIN SODIUM 60 MG/0.6ML IJ SOSY
60.0000 mg | PREFILLED_SYRINGE | INTRAMUSCULAR | Status: DC
  Administered 2024-06-27 – 2024-06-28 (×2): 60 mg via SUBCUTANEOUS
  Filled 2024-06-27 (×2): qty 0.6

## 2024-06-27 MED ORDER — METHOCARBAMOL 500 MG PO TABS
500.0000 mg | ORAL_TABLET | Freq: Three times a day (TID) | ORAL | Status: DC | PRN
  Administered 2024-06-27: 500 mg via ORAL
  Filled 2024-06-27: qty 1

## 2024-06-27 MED ORDER — SODIUM CHLORIDE 0.9 % IV SOLN: INTRAVENOUS | Status: DC

## 2024-06-27 MED ORDER — HYDROCORTISONE 1 % EX CREA
TOPICAL_CREAM | Freq: Four times a day (QID) | CUTANEOUS | Status: DC
  Administered 2024-06-27: 1 via TOPICAL
  Filled 2024-06-27: qty 28

## 2024-06-27 MED ORDER — LISINOPRIL 10 MG PO TABS
10.0000 mg | ORAL_TABLET | Freq: Every day | ORAL | Status: DC
  Administered 2024-06-27 – 2024-07-01 (×5): 10 mg via ORAL
  Filled 2024-06-27 (×5): qty 1

## 2024-06-27 MED ORDER — IOHEXOL 350 MG/ML SOLN
75.0000 mL | Freq: Once | INTRAVENOUS | Status: AC | PRN
  Administered 2024-06-27: 75 mL via INTRAVENOUS

## 2024-06-27 MED ORDER — LORAZEPAM 2 MG/ML IJ SOLN: 0.5000 mg | Freq: Once | INTRAMUSCULAR | Status: DC

## 2024-06-27 MED ORDER — ATORVASTATIN CALCIUM 10 MG PO TABS
10.0000 mg | ORAL_TABLET | Freq: Every day | ORAL | Status: DC
  Administered 2024-06-27 – 2024-07-01 (×5): 10 mg via ORAL
  Filled 2024-06-27 (×5): qty 1

## 2024-06-27 MED ORDER — GADOBUTROL 1 MMOL/ML IV SOLN
10.0000 mL | Freq: Once | INTRAVENOUS | Status: AC | PRN
  Administered 2024-06-27: 10 mL via INTRAVENOUS

## 2024-06-27 MED ORDER — CHLORHEXIDINE GLUCONATE CLOTH 2 % EX PADS
6.0000 | MEDICATED_PAD | Freq: Every day | CUTANEOUS | Status: DC
  Administered 2024-06-27 – 2024-06-30 (×3): 6 via TOPICAL

## 2024-06-27 MED ORDER — ACETAMINOPHEN 650 MG RE SUPP: 650.0000 mg | Freq: Four times a day (QID) | RECTAL | Status: DC | PRN

## 2024-06-27 MED ORDER — VANCOMYCIN HCL 1750 MG/350ML IV SOLN
1750.0000 mg | INTRAVENOUS | Status: DC
  Administered 2024-06-27: 1750 mg via INTRAVENOUS
  Filled 2024-06-27: qty 350

## 2024-06-27 MED ORDER — LORAZEPAM 2 MG/ML IJ SOLN
0.5000 mg | Freq: Once | INTRAMUSCULAR | Status: AC
  Administered 2024-06-27: 0.5 mg via INTRAVENOUS
  Filled 2024-06-27: qty 1

## 2024-06-27 MED ORDER — ORAL CARE MOUTH RINSE
15.0000 mL | OROMUCOSAL | Status: DC
  Administered 2024-06-27 – 2024-07-01 (×15): 15 mL via OROMUCOSAL

## 2024-06-27 MED ORDER — ABEMACICLIB 100 MG PO TABS: 100.0000 mg | ORAL_TABLET | Freq: Two times a day (BID) | ORAL | Status: DC

## 2024-06-27 MED ORDER — SODIUM CHLORIDE 0.9 % IV SOLN
2.0000 g | Freq: Three times a day (TID) | INTRAVENOUS | Status: DC
  Administered 2024-06-27 – 2024-06-28 (×4): 2 g via INTRAVENOUS
  Filled 2024-06-27 (×4): qty 12.5

## 2024-06-27 MED ORDER — SODIUM CHLORIDE 0.9 % IV SOLN
2.0000 g | Freq: Once | INTRAVENOUS | Status: AC
  Administered 2024-06-27: 2 g via INTRAVENOUS
  Filled 2024-06-27: qty 12.5

## 2024-06-27 MED ORDER — VANCOMYCIN HCL IN DEXTROSE 1-5 GM/200ML-% IV SOLN: 1000.0000 mg | Freq: Once | INTRAVENOUS | Status: DC

## 2024-06-27 MED ORDER — ACETAMINOPHEN 325 MG PO TABS
650.0000 mg | ORAL_TABLET | Freq: Four times a day (QID) | ORAL | Status: DC | PRN
  Administered 2024-06-27 – 2024-06-30 (×9): 650 mg via ORAL
  Filled 2024-06-27 (×9): qty 2

## 2024-06-27 MED ORDER — SODIUM CHLORIDE 0.9 % IV SOLN
100.0000 mg | Freq: Two times a day (BID) | INTRAVENOUS | Status: DC
  Administered 2024-06-27 – 2024-06-29 (×5): 100 mg via INTRAVENOUS
  Filled 2024-06-27 (×6): qty 100

## 2024-06-27 MED ORDER — CARVEDILOL 6.25 MG PO TABS
6.2500 mg | ORAL_TABLET | Freq: Two times a day (BID) | ORAL | Status: DC
Start: 2024-06-27 — End: 2024-07-01
  Administered 2024-06-27 – 2024-07-01 (×9): 6.25 mg via ORAL
  Filled 2024-06-27 (×9): qty 1

## 2024-06-27 NOTE — Hospital Course (Addendum)
 63 year old woman PMH including metastatic breast cancer not on chemotherapy presented to the emergency department with report of becoming suddenly nonverbal, not following commands although apparently able to walk to her car.  Per EDP exam was not following commands or speaking protected face from following arm but no movement against gravity.  CT head and CTA neck unrevealing, seen by teleneurology, TNK was offered but husband declined, was treated with Ativan  for possible seizure activity.  Apparently some concern for sepsis for unclear reasons.  Teleneurology consultation no usable speech, did not follow commands.  TNK considered but husband declined.  MRI brain recommended.  Also apparently with some kind of insect bite to right thigh several days prior, treated with Keflex  as an outpatient.  Low-grade temperature.  Exam of admitting physician reportedly alert, awake oriented to time place and person, moving all extremities but generally weak with pain on the right.  Admitted for possible sepsis without definite source of infection, consideration given to thigh cellulitis; acute encephalopathy.  Consultants Neurology   Procedures/Events 9/15 admit for encephalopathy, possible sepsis.  Apparently a rapid response was called overnight.  Approximately 1325 patient was at normal baseline when she suddenly developed some facial twitching, became completely nonverbal and not following commands.

## 2024-06-27 NOTE — ED Provider Notes (Signed)
  EMERGENCY DEPARTMENT AT [facility] Provider Note   CSN: [id number] Arrival date & time: 06/27/24  0009     Patient presents with: Insect Bite   [name] is a 63 y.o. female.   Level 5 caveat for acuity of condition.  Patient brought in by husband with altered mental status.  States she suddenlY felt hot while watching television with her husband.  She all of a sudden stopped speaking and is not following commands and was not interactive at all but was able to get in the car on her own power.  He brought her straight to the hospital.  Last normal was approximately 11 PM.  She was normal before this.  All of a sudden she felt hot and stopped speaking is not following commands and stopped answering questions.  However she was able to walk to the car on her own. She was not able to get out of the car on arrival to the hospital.  Husband reports history of breast cancer, hypertension but not on chemotherapy currently.  Did sustain spider bite to her right leg last week but he is not certain if she has been on antibiotics.  No known fever until tonight.  No vomiting, chest pain, shortness of breath, abdominal pain, pain with urination or blood in the urine.  The history is provided by the patient and a relative. The history is limited by the condition of the patient.       Prior to Admission medications   Medication Sig Start Date End Date Taking? Authorizing Provider  abemaciclib  (VERZENIO ) 100 MG tablet Take 1 tablet (100 mg total) by mouth 2 (two) times daily. 03/15/24   Odean Potts, MD  atorvastatin  (LIPITOR) 10 MG tablet Take 1 tablet by mouth daily.    [provider]  carvedilol  (COREG ) 6.25 MG tablet TAKE 1 TABLET BY MOUTH TWICE DAILY 03/03/15   Rudy Carlin LABOR, MD  diclofenac  Sodium (VOLTAREN ) 1 % GEL Apply 4 g topically 4 (four) times daily. 09/17/23   McDonald, Juliene SAUNDERS, DPM  diphenoxylate -atropine  (LOMOTIL ) 2.5-0.025 MG tablet Take  1 tablet by mouth 4 (four) times daily as needed for diarrhea or loose stools. 01/19/24   Gudena, Vinay, MD  letrozole  (FEMARA ) 2.5 MG tablet Take 1 tablet (2.5 mg total) by mouth daily. 08/14/23   Gudena, Vinay, MD  lisinopril -hydrochlorothiazide  (ZESTORETIC ) 10-12.5 MG tablet Take 1 tablet by mouth daily.    [provider]  naproxen  (NAPROSYN ) 500 MG tablet Take 500 mg by mouth daily as needed for moderate pain (pain score 4-6).    [provider]  ondansetron  (ZOFRAN ) 4 MG tablet TAKE 1 TABLET BY MOUTH EVERY 8 HOURS AS NEEDED FOR NAUSEA AND VOMITING Patient not taking: Reported on 06/22/2024 12/14/23   Gudena, Vinay, MD  pantoprazole  (PROTONIX ) 40 MG tablet TAKE 1 TABLET BY MOUTH EVERY DAY 04/11/24   Odean Potts, MD  Vitamin D , Ergocalciferol , (DRISDOL ) 1.25 MG (50000 UNIT) CAPS capsule TAKE 1 CAPSULE (50,000 UNITS TOTAL) BY MOUTH EVERY 7 (SEVEN) DAYS 09/28/23   Silva Juliene SAUNDERS, DPM    Allergies: Patient has no known allergies.    Review of Systems  Unable to perform ROS: Mental status change    Updated Vital Signs BP (!) 141/86 (BP Location: Right Arm)   Pulse (!) 120   Temp 98.9 F (37.2 C) (Oral)   Resp 18   Ht 6' 1 (1.854 m)   Wt 135.5 kg   SpO2 97%  BMI 39.41 kg/m   Physical Exam Vitals and nursing note reviewed.  Constitutional:      General: She is not in acute distress.    Appearance: She is well-developed. She is obese.     Comments: Moaning, protecting airway, does not speak.  Does not follow commands.  HENT:     Head: Normocephalic and atraumatic.     Mouth/Throat:     Pharynx: No oropharyngeal exudate.  Eyes:     Conjunctiva/sclera: Conjunctivae normal.     Pupils: Pupils are equal, round, and reactive to light.  Neck:     Comments: No meningismus. Cardiovascular:     Rate and Rhythm: Normal rate and regular rhythm.     Heart sounds: Normal heart sounds. No murmur heard. Pulmonary:     Effort: Pulmonary effort is normal. No respiratory  distress.     Breath sounds: Normal breath sounds.  Abdominal:     Palpations: Abdomen is soft.     Tenderness: There is no abdominal tenderness. There is no guarding or rebound.  Musculoskeletal:        General: No tenderness. Normal range of motion.     Cervical back: Normal range of motion and neck supple.  Skin:    General: Skin is warm.     Findings: Rash present.     Comments: Apparent bug bite to right thigh with mild erythema without fluctuance.  Neurological:     Mental Status: She is alert.     Motor: No abnormal muscle tone.     Comments: No obvious facial droop.  Will not speak or follow commands.  Will not hold arms or legs up against gravity but does protect face from falling arm.  Psychiatric:        Behavior: Behavior normal.     (all labs ordered are listed, but only abnormal results are displayed) Labs Reviewed  PROTIME-INR - Abnormal; Notable for the following components:      Result Value   Prothrombin Time 15.7 (*)    All other components within normal limits  CBC - Abnormal; Notable for the following components:   WBC 3.8 (*)    RBC 3.55 (*)    Hemoglobin 10.7 (*)    HCT 33.9 (*)    Platelets 137 (*)    All other components within normal limits  COMPREHENSIVE METABOLIC PANEL WITH GFR - Abnormal; Notable for the following components:   Glucose, Bld 128 (*)    Creatinine, Ser 1.45 (*)    GFR, Estimated 40 (*)    All other components within normal limits  URINALYSIS, W/ REFLEX TO CULTURE (INFECTION SUSPECTED) - Abnormal; Notable for the following components:   Color, Urine COLORLESS (*)    Hgb urine dipstick MODERATE (*)    Bacteria, UA RARE (*)    All other components within normal limits  BASIC METABOLIC PANEL WITH GFR - Abnormal; Notable for the following components:   Glucose, Bld 106 (*)    Creatinine, Ser 1.20 (*)    GFR, Estimated 51 (*)    All other components within normal limits  HEPATIC FUNCTION PANEL - Abnormal; Notable for the following  components:   Total Protein 6.3 (*)    All other components within normal limits  CBC WITH DIFFERENTIAL/PLATELET - Abnormal; Notable for the following components:   RBC 3.30 (*)    Hemoglobin 10.2 (*)    HCT 31.8 (*)    Platelets 107 (*)    Lymphs Abs 0.4 (*)  All other components within normal limits  CBG MONITORING, ED - Abnormal; Notable for the following components:   Glucose-Capillary 118 (*)    All other components within normal limits  I-STAT CHEM 8, ED - Abnormal; Notable for the following components:   Creatinine, Ser 1.60 (*)    Glucose, Bld 122 (*)    Hemoglobin 10.9 (*)    HCT 32.0 (*)    All other components within normal limits  RESP PANEL BY RT-PCR (RSV, FLU A&B, COVID)  RVPGX2  CULTURE, BLOOD (ROUTINE X 2)  CULTURE, BLOOD (ROUTINE X 2)  ETHANOL  APTT  URINE DRUG SCREEN  AMMONIA  TSH  LACTIC ACID, PLASMA  LACTIC ACID, PLASMA  BLOOD GAS, ARTERIAL  LIPASE, BLOOD  PRO BRAIN NATRIURETIC PEPTIDE  MAGNESIUM  CK  CBC  URINALYSIS, ROUTINE W REFLEX MICROSCOPIC  SPOTTED FEVER GROUP ANTIBODIES  LYME DISEASE SEROLOGY W/REFLEX  I-STAT CHEM 8, ED    EKG: EKG Interpretation Date/Time:  Monday June 27 2024 01:38:26 EDT Ventricular Rate:  99 PR Interval:  175 QRS Duration:  89 QT Interval:  371 QTC Calculation: 477 R Axis:   22  Text Interpretation: Sinus rhythm Abnormal R-wave progression, early transition Probable inferior infarct, age indeterminate Lateral leads are also involved Partial missing lead(s): V3 No significant change was found Confirmed by Carita Senior [phone]) on 06/27/2024 1:43:33 AM  Radiology: [facility] Chest Portable 1 View Result Date: 06/27/2024 CLINICAL DATA:  Altered mental status EXAM: PORTABLE CHEST 1 VIEW COMPARISON:  08/10/2023 FINDINGS: Cardiac shadow is enlarged but stable. The lungs are well aerated bilaterally. No focal infiltrate is seen. Stable scarring in the left mid lung is noted. No bony abnormality is seen. IMPRESSION: No  active disease. Electronically Signed   By: Oneil Devonshire M.D.   On: 06/27/2024 02:10   DG Femur Min 2 Views Right Result Date: 06/27/2024 CLINICAL DATA:  Right leg insect bite with pain and swelling, initial encounter EXAM: RIGHT FEMUR 2 VIEWS COMPARISON:  None Available. FINDINGS: No acute fracture or dislocation is noted. Degenerative changes of the knee joint are seen. No radiopaque foreign body is noted. No soft tissue abnormality is seen. IMPRESSION: No acute abnormality noted. Electronically Signed   By: Oneil Devonshire M.D.   On: 06/27/2024 02:10   CT ANGIO HEAD NECK W WO CM Result Date: 06/27/2024 CLINICAL DATA:  Initial evaluation for acute neuro deficit, stroke. EXAM: CT ANGIOGRAPHY HEAD AND NECK WITH AND WITHOUT CONTRAST TECHNIQUE: Multidetector CT imaging of the head and neck was performed using the standard protocol during bolus administration of intravenous contrast. Multiplanar CT image reconstructions and MIPs were obtained to evaluate the vascular anatomy. Carotid stenosis measurements (when applicable) are obtained utilizing NASCET criteria, using the distal internal carotid diameter as the denominator. RADIATION DOSE REDUCTION: This exam was performed according to the departmental dose-optimization program which includes automated exposure control, adjustment of the mA and/or kV according to patient size and/or use of iterative reconstruction technique. CONTRAST:  75mL OMNIPAQUE  IOHEXOL  350 MG/ML SOLN COMPARISON:  CT from earlier the same day. FINDINGS: CTA NECK FINDINGS Aortic arch: Visualized aortic arch within normal limits for caliber. Bovine branching pattern noted. No stenosis about the origin the great vessels. Right carotid system: No evidence of dissection, stenosis (50% or greater), or occlusion. Left carotid system: No evidence of dissection, stenosis (50% or greater), or occlusion. Vertebral arteries: No evidence of dissection, stenosis (50% or greater), or occlusion. Skeleton: No  discrete or worrisome osseous lesions. Moderately advanced spondylosis  at C5-6 and C6-7. Mild osteoarthritic changes noted about the TMJs bilaterally. Other neck: No other acute finding. Few small right thyroid  nodules noted, largest of which measures 1.4 cm on the right. These are of doubtful significance given size and patient age, with no follow-up imaging recommended (ref: J Am Coll Radiol. 2015 Feb;12(2): 143-50). Upper chest: Streaky densities noted within the partially visualized left upper lobe, favored to reflect atelectasis, although infiltrate not excluded. Probable sequelae of prior left axillary nodal dissection noted. Review of the MIP images confirms the above findings CTA HEAD FINDINGS Anterior circulation: Atheromatous change about the carotid siphons without hemodynamically significant stenosis. A1 segments, anterior communicating artery complex common anterior cerebral arteries patent without stenosis. No M1 stenosis or occlusion. No proximal MCA branch occlusion. Distal MCA branches perfused and symmetric. Posterior circulation: Both V4 segments patent without stenosis. Left vertebral artery dominant. Both PICA patent. Basilar patent without stenosis. Superior cerebellar and posterior cerebral arteries widely patent bilaterally. Venous sinuses: Patent allowing for timing the contrast bolus. Anatomic variants: None significant.  No aneurysm. Review of the MIP images confirms the above findings IMPRESSION: 1. Negative CTA for large vessel occlusion or other emergent finding. 2. Mild atheromatous change about the carotid siphons without hemodynamically significant stenosis. 3. Streaky densities within the partially visualized left upper lobe, favored to reflect atelectasis, although infiltrate could also be considered in the correct clinical setting. Correlation with plain film radiography suggested as warranted. Electronically Signed   By: Morene Hoard M.D.   On: 06/27/2024 01:15   CT  HEAD CODE STROKE WO CONTRAST Result Date: 06/27/2024 CLINICAL DATA:  Code stroke. Initial evaluation for acute neuro deficit, stroke suspected. EXAM: CT HEAD WITHOUT CONTRAST TECHNIQUE: Contiguous axial images were obtained from the base of the skull through the vertex without intravenous contrast. RADIATION DOSE REDUCTION: This exam was performed according to the departmental dose-optimization program which includes automated exposure control, adjustment of the mA and/or kV according to patient size and/or use of iterative reconstruction technique. COMPARISON:  None Available. FINDINGS: Brain: Cerebral volume within normal limits for patient age. No acute intracranial hemorrhage. No acute large vessel territory infarct. No mass lesion, midline shift, or mass effect. Ventricles are normal in size without hydrocephalus. No extra-axial fluid collection. Vascular: No abnormal hyperdense vessel. Skull: Scalp soft tissues demonstrate no acute abnormality. Calvarium intact. Sinuses/Orbits: Globes and orbital soft tissues within normal limits. Visualized paranasal sinuses are largely clear. No significant mastoid effusion. ASPECTS [facility] Stroke Program Early CT Score) - Ganglionic level infarction (caudate, lentiform nuclei, internal capsule, insula, M1-M3 cortex): 7 - Supraganglionic infarction (M4-M6 cortex): 3 Total score (0-10 with 10 being normal): 10 IMPRESSION: 1. No acute intracranial abnormality. 2. ASPECTS is 10. Results were called by telephone at the time of interpretation on 06/27/2024 at 12:38 am to provider [facility] , who verbally acknowledged these results. Electronically Signed   By: Morene Hoard M.D.   On: 06/27/2024 00:41     .Critical Care  Performed by: Carita Senior, MD Authorized by: Carita Senior, MD   Critical care provider statement:    Critical care time (minutes):  60   Critical care time was exclusive of:  Separately billable procedures and treating other  patients   Critical care was necessary to treat or prevent imminent or life-threatening deterioration of the following conditions:  Sepsis and CNS failure or compromise   Critical care was time spent personally by me on the following activities:  Development of treatment plan with patient or  surrogate, discussions with consultants, evaluation of patient's response to treatment, examination of patient, ordering and review of laboratory studies, ordering and review of radiographic studies, ordering and performing treatments and interventions, pulse oximetry, re-evaluation of patient's condition, review of old charts, blood draw for specimens and obtaining history from patient or surrogate   I assumed direction of critical care for this patient from another provider in my specialty: no     Care discussed with: admitting provider      Medications Ordered in the ED - No data to display                                  Medical Decision Making Amount and/or Complexity of Data Reviewed Independent Historian: spouse Labs: ordered. Decision-making details documented in ED Course. Radiology: ordered and independent interpretation performed. Decision-making details documented in ED Course. ECG/medicine tests: ordered and independent interpretation performed. Decision-making details documented in ED Course.  Risk Prescription drug management. Decision regarding hospitalization.   Patient here with sudden onset of mental status change, not speaking, not following commands, was normal about an hour and a half ago.  Code stroke activated on arrival.  Rectal temperature 100.1.  Vitals are stable.  Protecting airway. Has been on keflex  for 3 days and thought to have had a spider bite but did not see a spider or other insect.   Last seen normal around 11 PM.  Husband reports she was able to walk to the car and was mumbling but then stopped speaking and route was not able to get out of the car on arrival  to the hospital.  Patient will not speak or follow commands but does protect face and following arm.  No history of seizures per husband at bedside.  CT head negative for hemorrhage.  CTA negative for large vessel occlusion.  Discussed with Dr. Nicholes of radiology. Possible LUL infiltrate on CTA neck but none seen on CXR.  Rectal temperature 100.1  Patient will not move extremities antigravity.  She does not have any intelligible speech.  She will occasionally yell out and moan but cannot speak or answer questions reliably.  Would not follow commands.  Does protect face from falling arm.   Discussed with teleneurology Dr. Albina who saw patient.  CT head and CTA negative for hemorrhage or large vessel occlusion.  Patient difficulty speaking of uncertain etiology.  Will not follow commands but does protect face from falling arm.  She does have a history of breast cancer but no known brain metastasis.  No brain imaging available.  TNK was offered by neurology to husband.  Patient and husband declined after discussion of risks and benefits given risk of hemorrhage.  They appear to have capacity to make this decision.  Will pursue infectious and metabolic workup of encephalopathy. Patient and family not aware of any brain metastasis.  No recent MRIs in system.  They are requesting to call her oncologist who is not available at this time tonight. They do not wish to receive TNK given this and her deficits seem to be improving.   She is given IV fluids and IV antibiotics after cultures are obtained.  She trial of Ativan  was given with concern for possible seizure activity. She has started to speak some and follows some commands.  She is oriented to person and place.  Denies headache.  Complains of pain to her thigh only where her recent questionable spider  bite is.  She may have sepsis from her right thigh infection.  X-ray is negative for foreign body.  She is given broad-spectrum antibiotics and IV  fluids after cultures were obtained.  Remains tachycardic. But stable blood pressure and protecting airway.  Will treat for possible sepsis and cellulitis with recent thigh wound.  Hypertensive encephalopathy considered as well. IV labetalol  given.  Admission d.w Dr. Franky.       Final diagnoses:  Acute encephalopathy    ED Discharge Orders     None          Henrique Parekh, Garnette, MD 06/27/24 [phone]

## 2024-06-27 NOTE — Progress Notes (Addendum)
 Progress Note   Patient: [name] FMW:[id number] DOB: Mar 10, 1961 DOA: 06/27/2024     0 DOS: the patient was seen and examined on 06/27/2024   Brief hospital course: 63 year old woman PMH including metastatic breast cancer not on chemotherapy presented to the emergency department with report of becoming suddenly nonverbal, not following commands although apparently able to walk to her car.  Per EDP exam was not following commands or speaking protected face from following arm but no movement against gravity.  CT head and CTA neck unrevealing, seen by teleneurology, TNK was offered but husband declined, was treated with Ativan  for possible seizure activity.  Apparently some concern for sepsis for unclear reasons.  Teleneurology consultation no usable speech, did not follow commands.  TNK considered but husband declined.  MRI brain recommended.  Also apparently with some kind of insect bite to right thigh several days prior, treated with Keflex  as an outpatient.  Low-grade temperature.  Exam of admitting physician reportedly alert, awake oriented to time place and person, moving all extremities but generally weak with pain on the right.  Admitted for possible sepsis without definite source of infection, consideration given to thigh cellulitis; acute encephalopathy.  Consultants Neurology   Procedures/Events 9/15 admit for encephalopathy, possible sepsis.  Apparently a rapid response was called overnight.  Approximately 1325 patient was at normal baseline when she suddenly developed some facial twitching, became completely nonverbal and not following commands.  Assessment and Plan: Acute encephalopathy Prior to admission patient with episode of becoming nonverbal, not following commands.  In the emergency department EDP documented the same, was seen by teleneurology and has been declined intervention.  CT imaging was negative for LVO and acute abnormalities.  By the time patient seen by  admitting physician described as being alert and oriented and following commands.  Subsequent MRI brain was negative Approximately 1325 patient was at baseline today when she suddenly again became nonverbal, not following commands, mouth twitching.  Code stroke was called, CT head was negative, patient seen by teleneurology with recommendation for EEG.  I have also discussed at the bedside with Dr. Deedra who has visualized the patient, recommended against antiepileptics, will proceed with EEG.  Diagnosis is uncertain at this time. Transferred to stepdown unit for close monitoring.  SIRS Possible sepsis described on admission, with low-grade temperature, tachycardia, possible right sided thigh cellulitis.  Possible insect bite. Imaging was ordered of the thigh but will defer at this time until patient more stable. I do not see any signs or symptoms of sepsis at this time.  Thigh looks to have a local reaction, not clearly cellulitis. Can continue antibiotics for now and monitor.  Hypertension Can continue antihypertensives when able to take p.o.  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 Odean presently on Verzenio  and letrozole .   Thrombocytopenia Anemia Thrombocytopenia has been present for several months, will follow clinically. Hemoglobin stable  Prognosis is guarded, discussed with daughter at bedside    Subjective:  Called to see patient, last known normal 1325, seen immediately in the after Patient nonverbal and unable to provide any history Per nursing patient took medications this morning seem to be at baseline, per daughter patient was at baseline until 1325 when she became nonverbal and had facial twitching.  No seizure-like activity otherwise seen.  Code stroke was called and patient was transferred to the stepdown unit  Physical Exam:  Vitals:   06/27/24 1400  06/27/24 1405 06/27/24 1410 06/27/24 1415  BP: 119/81 134/87 130/86 113/78  Pulse: 82 80 85 85  Resp: 18 15 12 19   Temp: 98.2 F (36.8 C)     TempSrc: Axillary     SpO2: 96% 99% 100% 97%  Weight:      Height:       Physical Exam Vitals reviewed.  Constitutional:      General: She is in acute distress.     Appearance: She is ill-appearing. She is not toxic-appearing.  Cardiovascular:     Rate and Rhythm: Normal rate and regular rhythm.     Heart sounds: No murmur heard. Pulmonary:     Effort: Pulmonary effort is normal. No respiratory distress.     Breath sounds: No wheezing, rhonchi or rales.  Abdominal:     General: There is no distension.     Palpations: Abdomen is soft.     Tenderness: There is no abdominal tenderness. There is no guarding.  Musculoskeletal:     Right lower leg: No edema.     Left lower leg: No edema.  Neurological:     Comments: Has some facial twitching or twitching of the mouth, it looks like she is attempting to talk.  Does track with eyes.  Does not move arms or legs to command.  Protects face when arm is held above head.  Psychiatric:     Comments: Cannot assess mood or affect.     Data Reviewed: CBG stable CMP unremarkable today Hemoglobin stable 10.2, platelets 107 Urine drug screen and alcohol level negative. MRI brain no acute abnormality  Family Communication: daughter at bedside  Disposition: Status is: Inpatient Remains inpatient appropriate because: see above     Time spent: 85 minutes Critical care provided for acute encephalopathy, acute neurologic change, concern for seizure and stroke, transfer to stepdown unit, prognosis guarded  Author: Toribio Door, MD 06/27/2024 3:00 PM  For on call review [email or web address].

## 2024-06-27 NOTE — TOC Initial Note (Signed)
 Transition of Care [facility]) - Initial/Assessment Note   Patient Details  Name: [name] MRN: [id number] Date of Birth: 10/18/1960  Transition of Care [facility]) CM/SW Contact:    Duwaine GORMAN Aran, LCSW Phone Number: 06/27/2024, 8:54 AM  Clinical Narrative: Patient is from home with spouse. Patient is currently requiring IV antibiotics. Care management following for possible discharge needs.  Expected Discharge Plan: Home/Self Care Barriers to Discharge: Continued Medical Work up  Expected Discharge Plan and Services In-house Referral: Clinical Social Work Living arrangements for the past 2 months: Single Family Home            DME Arranged: N/A DME Agency: NA  Prior Living Arrangements/Services Living arrangements for the past 2 months: Single Family Home Lives with:: Spouse Patient language and need for interpreter reviewed:: Yes Do you feel safe going back to the place where you live?: Yes      Need for Family Participation in Patient Care: Yes (Comment) Care giver support system in place?: Yes (comment) Criminal Activity/Legal Involvement Pertinent to Current Situation/Hospitalization: No - Comment as needed  Activities of Daily Living ADL Screening (condition at time of admission) Independently performs ADLs?: Yes (appropriate for developmental age)  Emotional Assessment Orientation: : Oriented to Self, Oriented to Situation Alcohol / Substance Use: Not Applicable Psych Involvement: No (comment)  Admission diagnosis:  Acute encephalopathy [G93.40] Sepsis (HCC) [A41.9] Patient Active Problem List   Diagnosis Date Noted   Acute encephalopathy 06/27/2024   Sepsis (HCC) 06/27/2024   CKD (chronic kidney disease) stage 3, GFR 30-59 ml/min (HCC) 06/27/2024   Hematochezia 01/12/2024   Internal hemorrhoids 01/12/2024   Anemia of chronic disease 01/12/2024   Bleeding internal hemorrhoids 01/12/2024   Rectal bleeding 01/12/2024   Obesity, Class II, BMI 35-39.9  01/11/2024   Lower GI bleed 01/10/2024   Rigors 01/10/2024   Malignant neoplastic disease (HCC) 02/22/2021   HLD (hyperlipidemia) 07/27/2020   Palpitations 02/01/2020   Breast cancer, left breast (HCC) 03/11/2019   Port-A-Cath in place 11/18/2018   Malignant neoplasm of upper-outer quadrant of left breast in female, estrogen receptor positive (HCC) 10/26/2018   Bilateral carpal tunnel syndrome 10/21/2017   Laceration of earlobe, left, initial encounter 06/01/2017   Abscess of external ear, left 05/10/2017   Female stress incontinence 02/21/2014   Symptomatic menopausal or female climacteric states 02/21/2014   Essential hypertension 07/31/2008   PCP:  Cristopher Suzen HERO, NP Pharmacy:   [phone] - [place], [place] - [address] AT [facility] [address] [place] [address] Phone: [phone] Fax: [phone]  Social Drivers of Health (SDOH) Social History: SDOH Screenings   Food Insecurity: No Food Insecurity (01/10/2024)  Housing: Low Risk  (01/10/2024)  Transportation Needs: No Transportation Needs (01/10/2024)  Utilities: Not At Risk (01/10/2024)  Social Connections: Socially Integrated (01/10/2024)  Tobacco Use: Low Risk  (06/27/2024)   SDOH Interventions:    Readmission Risk Interventions     No data to display

## 2024-06-27 NOTE — Progress Notes (Signed)
 Pharmacy Antibiotic Note  [name] is a 63 y.o. female admitted on 06/27/2024 with sepsis.  Pharmacy has been consulted for Vanco, Cefepime  dosing.  ID: sepsis -CXR neg; recent tick bite so covering for RMSF  - Tmax 100.9, WBC 4.5, Scr 1.2 down  Antimicrobials this admission: 9/15 Cefepime  >>  9/15 Vancomycin  >>  9/15 Flagyl  x`1 9/15 Doxy>>   Dose adjustments this admission: -Vancomycin  1250mg  IV q24h (Scr 1.6, Vd 0.5, AUC 461.8) - 9/15: Adjust for Scr 1.2 - Microbiology results: 9/15 BCx:   Plan: Increase Cefepime  2gm IV q8h  Increase Vancomycin  1750 mg IV Q 24 hrs. Goal AUC 400-550. Expected AUC: 198 SCr used: 1.2    Height: 6' 1 (185.4 cm) Weight: 135.5 kg (298 lb 11.6 oz) IBW/kg (Calculated) : 75.4  Temp (24hrs), Avg:100 F (37.8 C), Min:98.9 F (37.2 C), Max:100.9 F (38.3 C)  Recent Labs  Lab 06/22/24 0817 06/27/24 0014 06/27/24 0033 06/27/24 0120 06/27/24 0436  WBC 3.1* 3.8*  --   --  4.5  CREATININE 1.55* 1.45* 1.60*  --  1.20*  LATICACIDVEN  --   --   --  1.1 1.2    Estimated Creatinine Clearance: 75.3 mL/min (A) (by C-G formula based on SCr of 1.2 mg/dL (H)).    No Known Allergies  [name], PharmD, BCPS Clinical Staff Pharmacist  [name] 06/27/2024 8:21 AM

## 2024-06-27 NOTE — Progress Notes (Signed)
 Rapid response called to evaluate patient, while enroute to patient Code stroke called overhead. Per chart review patient presented similarly in the Emergency Department. Patient transported to CT by this RN, beside RN, and AC. After CT MD Germaine assessed patient Via Tele-cart. Patient then transported to [facility] unit for closer monitoring.      06/27/24 1339  Vitals  Temp 98.2 F (36.8 C)  Temp Source Axillary  BP (!) 146/96  MAP (mmHg) 110  BP Location Right Arm  BP Method Automatic  Patient Position (if appropriate) Lying  Pulse Rate 96  Pulse Rate Source Dinamap  ECG Heart Rate 96  Cardiac Rhythm NSR  Resp 20  Oxygen Therapy  SpO2 100 %  O2 Device Room Air  NIH Stroke Scale   Dizziness Present No  Headache Present No  Interval Neuro change  Level of Consciousness (1a.)    1  LOC Questions (1b. )    2  LOC Commands (1c. )    2  Best Gaze (2. )   0  Visual (3. )   0  Facial Palsy (4. )     1  Motor Arm, Left (5a. )    3  Motor Arm, Right (5b. )  3  Motor Leg, Left (6a. )   3  Motor Leg, Right (6b. )  3  Limb Ataxia (7. ) 0  Sensory (8. )   0  Best Language (9. )   3  Dysarthria (10. ) 2  Extinction/Inattention (11.)    0  Complete NIHSS TOTAL 23  Large Vessel Occlusion (VAN) Screening  Weakness Moderate  Visual Disturbance  NONE  Aphasia Expressive  Neglect NONE  VAN Screening (!) Positive  Pupils  L Pupil Size (mm) 4  L Pupil Reaction Brisk  R Pupil Size (mm) 4  R Pupil Reaction Brisk  Pre-Screen- If YES to any of the following, STOP the screen, keep NPO, and place order for SLP eval and treat.   Home diet required thickened liquids No - Proceed  Trach tube present No - Proceed  Radiation to Head/Neck No - Proceed  Patient Readiness- If YES to any of the following, WAIT to screen, keep NPO, and place order for SLP eval and treat. May rescreen if clinical improvement WITHIN 24h.    Is patient lethargic or unable to stay alert/awake? (!) Yes Keep  NPO/Place SLP Eval

## 2024-06-27 NOTE — Progress Notes (Addendum)
 TELENEUROLOGY STROKE ALERT NOTE   Date of service: June 27, 2024 Patient Name: [name] MRN:  [id number] DOB:  12/13/1960 Chief Complaint: Aphasic, weakness in all extremities Requesting Provider: Jadine Toribio SQUIBB, MD  History of Present Illness  [name] is a 63 y.o. female with hx of metastatic breast cancer being followed by Dr. Odean, oncologist, hypertension, chronic kidney disease stage III was brought to the ER after patient's husband found that patient became suddenly confused and unable to talk and generally weak. Evaluated as a code stroke last evening.  Family refused thrombolytics.  Work up since that time has been unremarkable and includes CTA of  the had and neck and MRI of the brain.  Today patient had a similar event where she became nonverbal and unable to follow commands.  Weak in all extremities.    LKW: 1345 on 06/27/2024 Modified rankin score: 0-Completely asymptomatic and back to baseline post- stroke IV Thrombolysis: No,  low likelihood of stroke EVT: No,  low likelihood of stroke   NIHSS components Score: Comment  1a Level of Conscious 0[]  1[x]  2[]  3[]      1b LOC Questions 0[]  1[]  2[x]       1c LOC Commands 0[]  1[]  2[x]       2 Best Gaze 0[x]  1[]  2[]       3 Visual 0[x]  1[]  2[]  3[]      4 Facial Palsy 0[x]  1[]  2[]  3[]      5a Motor Arm - left 0[]  1[]  2[x]  3[]  4[]  UN[]    5b Motor Arm - Right 0[]  1[]  2[]  3[x]  4[]  UN[]    6a Motor Leg - Left 0[]  1[]  2[]  3[x]  4[]  UN[]    6b Motor Leg - Right 0[]  1[]  2[]  3[x]  4[]  UN[]    7 Limb Ataxia 0[x]  1[]  2[]  UN[]      8 Sensory 0[x]  1[]  2[]  UN[]      9 Best Language 0[]  1[]  2[]  3[x]      10 Dysarthria 0[]  1[]  2[x]  UN[]      11 Extinct. and Inattention 0[x]  1[]  2[]       TOTAL: 21     Gurgles on attempts to speak.  Nods head in response to questioning.  Grimaces to light noxious stimuli and has some lower extremity movement to being lightly stroked on the feet bilaterally.     ROS   Unable  to ascertain due to inability to speak  Past History   Past Medical History:  Diagnosis Date   Complication of anesthesia    PONV   Fatigue    GERD (gastroesophageal reflux disease)    w nonobstructing esophageal stricture   Headache    Heat intolerance    History of ETT 03/2009   myoview EF 65%, normal wall motion, no ischemia or infarction, poor exercise capacity   HTN (hypertension)    left breast ca dx'd 10/2018   breast- left   Motion sickness    Normal echocardiogram 03/2009   EF 60-65% moderate diastolic dysfunction, mild LAE   Numbness and tingling    Obesity    PONV (postoperative nausea and vomiting)    S/P partial hysterectomy     Past Surgical History:  Procedure Laterality Date   ABDOMINAL HYSTERECTOMY  2007   partial    AXILLARY LYMPH NODE DISSECTION Left 03/11/2019   Procedure: LEFT AXILLARY LYMPH NODE DISSECTION;  Surgeon: Vanderbilt Ned, MD;  Location: [facility];  Service: General;  Laterality: Left;   BREAST LUMPECTOMY WITH RADIOACTIVE SEED AND SENTINEL  LYMPH NODE BIOPSY Left 02/22/2019   Procedure: LEFT BREAST LUMPECTOMY x2 WITH RADIOACTIVE SEED AND LEFT AXILLA SEED  GUIDED LYMPH NODE BIOPSY AND LEFT SENTINEL LYMPH NODE MAPPING;  Surgeon: Vanderbilt Ned, MD;  Location: [facility];  Service: General;  Laterality: Left;   BRONCHIAL BIOPSY  08/10/2023   Procedure: BRONCHIAL BIOPSIES;  Surgeon: Brenna Adine CROME, DO;  Location: [facility];  Service: Pulmonary;;   CARPAL TUNNEL RELEASE Bilateral 2018   FOOT SURGERY Bilateral 2016   hysterectomy (other)     Left ear lobe repair Left 05/25/2024   PORT-A-CATH REMOVAL N/A 02/22/2019   Procedure: REMOVAL PORT-A-CATH;  Surgeon: Vanderbilt Ned, MD;  Location: [facility];  Service: General;  Laterality: N/A;   PORTACATH PLACEMENT N/A 11/10/2018   Procedure: INSERTION PORT-A-CATH WITH ULTRASOUND;  Surgeon: Vanderbilt Ned, MD;  Location: [facility];  Service: General;   Laterality: N/A;   TONSILLECTOMY  1968   UPPER GASTROINTESTINAL ENDOSCOPY      Family History: Family History  Problem Relation Age of Onset   Heart attack Mother 21   Heart disease Mother    Diabetes Sister    Heart attack Maternal Grandmother        age 67 or 25   Heart disease Maternal Grandmother    Neuropathy Neg Hx    Colon cancer Neg Hx    Colon polyps Neg Hx    Esophageal cancer Neg Hx    Stomach cancer Neg Hx    Rectal cancer Neg Hx     Social History  reports that she has never smoked. She has never been exposed to tobacco smoke. She has never used smokeless tobacco. She reports that she does not drink alcohol and does not use drugs.  No Known Allergies  Medications   Current Facility-Administered Medications:    acetaminophen  (TYLENOL ) tablet 650 mg, 650 mg, Oral, Q6H PRN, 650 mg at 06/27/24 1200 **OR** acetaminophen  (TYLENOL ) suppository 650 mg, 650 mg, Rectal, Q6H PRN, Franky Redia SAILOR, MD   atorvastatin  (LIPITOR) tablet 10 mg, 10 mg, Oral, Daily, Franky, Arshad N, MD, 10 mg at 06/27/24 1200   carvedilol  (COREG ) tablet 6.25 mg, 6.25 mg, Oral, BID, Franky Redia SAILOR, MD, 6.25 mg at 06/27/24 1200   ceFEPIme  (MAXIPIME ) 2 g in sodium chloride  0.9 % 100 mL IVPB, 2 g, Intravenous, Q8H, Robertson, Crystal S, RPH, Last Rate: 200 mL/hr at 06/27/24 0841, 2 g at 06/27/24 0841   doxycycline  (VIBRAMYCIN ) 100 mg in sodium chloride  0.9 % 250 mL IVPB, 100 mg, Intravenous, Q12H, Franky Redia SAILOR, MD, Last Rate: 125 mL/hr at 06/27/24 0523, 100 mg at 06/27/24 0523   enoxaparin  (LOVENOX ) injection 60 mg, 60 mg, Subcutaneous, Q24H, Franky Redia SAILOR, MD, 60 mg at 06/27/24 9158   lactated ringers  infusion, , Intravenous, Continuous, Franky Redia SAILOR, MD, Last Rate: 125 mL/hr at 06/27/24 0517, Restarted at 06/27/24 0517   letrozole  (FEMARA ) tablet 2.5 mg, 2.5 mg, Oral, Daily, Franky Redia SAILOR, MD, 2.5 mg at 06/27/24 0840   lisinopril  (ZESTRIL ) tablet 10 mg, 10  mg, Oral, Daily, Franky Redia SAILOR, MD, 10 mg at 06/27/24 9160   LORazepam  (ATIVAN ) injection 0.5 mg, 0.5 mg, Intravenous, Once, Rancour, Stephen, MD   methocarbamol  (ROBAXIN ) tablet 500 mg, 500 mg, Oral, Q8H PRN, Jadine Toribio SQUIBB, MD, 500 mg at 06/27/24 1200   Oral care mouth rinse, 15 mL, Mouth Rinse, 4 times per day, Jadine Toribio SQUIBB, MD, 15 mL at 06/27/24 1151   Oral care mouth  rinse, 15 mL, Mouth Rinse, PRN, Jadine Toribio SQUIBB, MD   pantoprazole  (PROTONIX ) EC tablet 40 mg, 40 mg, Oral, Daily, Franky Redia SAILOR, MD, 40 mg at 06/27/24 9158   vancomycin  (VANCOREADY) IVPB 1750 mg/350 mL, 1,750 mg, Intravenous, Q24H, Robertson, Crystal S, RPH  Facility-Administered Medications Ordered in Other Encounters:    gadopentetate dimeglumine  (MAGNEVIST ) injection 20 mL, 20 mL, Intravenous, Once PRN, Ines Onetha NOVAK, MD   gadopentetate dimeglumine  (MAGNEVIST ) injection 20 mL, 20 mL, Intravenous, Once PRN, Ines Onetha NOVAK, MD  Vitals   Vitals:   06/27/24 0306 06/27/24 0525 06/27/24 0530 06/27/24 0944  BP: (!) 156/100  (!) 141/86 (!) 144/88  Pulse: (!) 115  (!) 120 (!) 105  Resp: 18  18 18   Temp: (!) 100.9 F (38.3 C)  98.9 F (37.2 C) 98.9 F (37.2 C)  TempSrc: Oral  Oral Oral  SpO2: 98%  97% 99%  Weight:  135.5 kg    Height:  6' 1 (1.854 m)      Body mass index is 39.41 kg/m.     Labs/Imaging/Neurodiagnostic studies   CBC:  Recent Labs  Lab 2024-06-28 0817 28-Jun-2024 0817 06/27/24 0014 06/27/24 0033 06/27/24 0436  WBC 3.1*  --  3.8*  --  4.5  NEUTROABS 1.6*  --   --   --  3.8  HGB 11.1*   < > 10.7* 10.9* 10.2*  HCT 33.1*  --  33.9* 32.0* 31.8*  MCV 91.4  --  95.5  --  96.4  PLT 145*  --  137*  --  107*   < > = values in this interval not displayed.   Basic Metabolic Panel:  Lab Results  Component Value Date   NA 139 06/27/2024   K 3.8 06/27/2024   CO2 22 06/27/2024   GLUCOSE 106 (H) 06/27/2024   BUN 21 06/27/2024   CREATININE 1.20 (H) 06/27/2024   CALCIUM   9.1 06/27/2024   GFRNONAA 51 (L) 06/27/2024   GFRAA >60 06/26/2020   Lipid Panel:  Lab Results  Component Value Date   LDLCALC 53 12/08/2023   HgbA1c:  Lab Results  Component Value Date   HGBA1C 5.7 (H) 05/13/2017   Urine Drug Screen:     Component Value Date/Time   LABOPIA NEGATIVE 06/27/2024 0631   COCAINSCRNUR NEGATIVE 06/27/2024 0631   LABBENZ NEGATIVE 06/27/2024 0631   AMPHETMU NEGATIVE 06/27/2024 0631   THCU NEGATIVE 06/27/2024 0631   LABBARB NEGATIVE 06/27/2024 0631    Alcohol Level     Component Value Date/Time   ETH <15 06/27/2024 0436   INR  Lab Results  Component Value Date   INR 1.2 06/27/2024   APTT  Lab Results  Component Value Date   APTT 29 06/27/2024   AED levels: No results found for: PHENYTOIN, ZONISAMIDE, LAMOTRIGINE, LEVETIRACETA  CT Head without contrast(Personally reviewed): CT HEAD WITHOUT CONTRAST 06/27/2024 02:01:08 PM   TECHNIQUE: CT of the head was performed without the administration of intravenous contrast. Automated exposure control, iterative reconstruction, and/or weight based adjustment of the mA/kV was utilized to reduce the radiation dose to as low as reasonably achievable.   COMPARISON: MRI head 06/27/2024   CLINICAL HISTORY: Neuro deficit, acute, stroke suspected. CODE STROKE; Dr.Savannaha Stonerock; [phone]; Expressive aphasia; Unable to follow commands   FINDINGS:   BRAIN AND VENTRICLES: No acute hemorrhage. No evidence of acute infarct. No hydrocephalus. No extra-axial collection. No mass effect or midline shift.   Alberta stroke program early CT (aspect) score ----- Ganglionic (caudate, internal  capsule, lentiform nucleus, insula, M1-m3): 7 Supraganglionic (m4-m6): 3   Total: 10  CT angio Head and Neck with contrast(Personally reviewed): Narrative & Impression  CLINICAL DATA:  Initial evaluation for acute neuro deficit, stroke.   EXAM: CT ANGIOGRAPHY HEAD AND NECK WITH AND WITHOUT CONTRAST    TECHNIQUE: Multidetector CT imaging of the head and neck was performed using the standard protocol during bolus administration of intravenous contrast. Multiplanar CT image reconstructions and MIPs were obtained to evaluate the vascular anatomy. Carotid stenosis measurements (when applicable) are obtained utilizing NASCET criteria, using the distal internal carotid diameter as the denominator.   RADIATION DOSE REDUCTION: This exam was performed according to the departmental dose-optimization program which includes automated exposure control, adjustment of the mA and/or kV according to patient size and/or use of iterative reconstruction technique.   CONTRAST:  75mL OMNIPAQUE  IOHEXOL  350 MG/ML SOLN   COMPARISON:  CT from earlier the same day.   FINDINGS: CTA NECK FINDINGS   Aortic arch: Visualized aortic arch within normal limits for caliber. Bovine branching pattern noted. No stenosis about the origin the great vessels.   Right carotid system: No evidence of dissection, stenosis (50% or greater), or occlusion.   Left carotid system: No evidence of dissection, stenosis (50% or greater), or occlusion.   Vertebral arteries: No evidence of dissection, stenosis (50% or greater), or occlusion.   Skeleton: No discrete or worrisome osseous lesions. Moderately advanced spondylosis at C5-6 and C6-7. Mild osteoarthritic changes noted about the TMJs bilaterally.   Other neck: No other acute finding. Few small right thyroid  nodules noted, largest of which measures 1.4 cm on the right. These are of doubtful significance given size and patient age, with no follow-up imaging recommended (ref: J Am Coll Radiol. 2015 Feb;12(2): 143-50).   Upper chest: Streaky densities noted within the partially visualized left upper lobe, favored to reflect atelectasis, although infiltrate not excluded. Probable sequelae of prior left axillary nodal dissection noted.   Review of the MIP images  confirms the above findings   CTA HEAD FINDINGS   Anterior circulation: Atheromatous change about the carotid siphons without hemodynamically significant stenosis. A1 segments, anterior communicating artery complex common anterior cerebral arteries patent without stenosis. No M1 stenosis or occlusion. No proximal MCA branch occlusion. Distal MCA branches perfused and symmetric.   Posterior circulation: Both V4 segments patent without stenosis. Left vertebral artery dominant. Both PICA patent. Basilar patent without stenosis. Superior cerebellar and posterior cerebral arteries widely patent bilaterally.   Venous sinuses: Patent allowing for timing the contrast bolus.   Anatomic variants: None significant.  No aneurysm.   Review of the MIP images confirms the above findings   IMPRESSION: 1. Negative CTA for large vessel occlusion or other emergent finding. 2. Mild atheromatous change about the carotid siphons without hemodynamically significant stenosis. 3. Streaky densities within the partially visualized left upper lobe, favored to reflect atelectasis, although infiltrate could also be considered in the correct clinical setting. Correlation with plain film radiography suggested as warranted.     Electronically Signed   By: Morene Hoard M.D.   On: 06/27/2024 01:15    MRI Brain(Personally reviewed): Narrative & Impression  CLINICAL DATA:  Neuro deficit, acute stroke suspected   EXAM: MRI HEAD WITHOUT AND WITH CONTRAST   TECHNIQUE: Multiplanar, multiecho pulse sequences of the brain and surrounding structures were obtained without and with intravenous contrast.   CONTRAST:  10mL GADAVIST  GADOBUTROL  1 MMOL/ML IV SOLN   COMPARISON:  May 06, 2017  FINDINGS: MRI brain:   The brain volume is normal.   There are several foci of T2 hyperintensity in the cerebral white matter. These do not have restricted diffusion.   There is no acute or chronic infarct.    The ventricles are normal.   No mass lesion.   There are normal flow signals in the carotid arteries and basilar artery.   No significant bone marrow signal abnormality.   No significant abnormality in the paranasal sinuses or soft tissues.   IMPRESSION: No acute infarct or other significant abnormality   No change from May 06, 2017     Electronically Signed   By: Nancyann Burns M.D.   On: 06/27/2024 08:02       ASSESSMENT   Rodina Pinales Claudene Konig is a 63 y.o. female with hx of metastatic breast cancer being followed by Dr. Odean, oncologist, hypertension, chronic kidney disease stage III was brought to the ER after patient's husband found that patient became suddenly confused and unable to talk and generally weak. Evaluated as a code stroke last evening.  Family refused thrombolytics.  Work up since that time has been unremarkable and includes CTA of  the had and neck and MRI of the brain.  Today patient had a similar event where she became nonverbal and unable to follow commands.  Weak in all extremities.  Starting to show some improvement spontaneously.  Repeat head CT shows no acute changes.  NIHSS of 21.  Current neurological examination is not consistent with acute ischemic event and with normal CTA and MRI of the brain from recent event would not treat with thrombolytics or consider thrombectomy at this time.  Other etiologies should be considered such as toxic/metabolic encephalopathy and seizure.    RECOMMENDATIONS  Continue frequent neuro checks Agree with infection w/u EEG stat Patient to be followed by neurology ______________________________________________________________________  This patient was evaluated using a telemedicine platform.  This patient is receiving care for possible acute neurological changes. There was 25 minutes of care by this provider at the time of service, including time for direct evaluation via telemedicine, review of medical records,  imaging studies and discussion of findings with providers with development of management and treatment plans discussed.     Page Time: 1338 Call Back Time: 1340 Camera Time: 1346 Location of Provider: [facility] Patient Location: [facility]  Signed, Alaze Garverick, MD [facility]   .lr

## 2024-06-27 NOTE — Progress Notes (Signed)
 Pt being followed by [facility] for Sepsis protocol.

## 2024-06-27 NOTE — Progress Notes (Signed)
 EEG complete, results are pending.

## 2024-06-27 NOTE — Progress Notes (Signed)
 Patient was drowsy and husband was at bed. Husband refused to answer admissions questions and kept patient's belonging by his side.

## 2024-06-27 NOTE — Progress Notes (Signed)
 Prior-To-Admission Oral Chemotherapy for Treatment of Oncologic Disease   Order noted from Dr. Jadine to continue prior-to-admission oral chemotherapy regimen of Verzenio  100mg  BID.  Procedure Per Pharmacy & Therapeutics Committee Policy: Orders for continuation of home oral chemotherapy for treatment of an oncologic disease will be held unless approved by an oncologist during current admission.    For patients receiving oncology care at [facility], inpatient pharmacist contacts patient's oncologist during regular office hours to review. If earlier review is medically necessary, attending physician consults [facility] on-call oncologist  Dr Odean notified via secure chat of order to continue Verzenio .  He requested Verzenio  continued to be held at this time due to current neurological work-up.   Kemp Arvin Fletcher, PharmD 06/27/2024, 6:01 PM

## 2024-06-27 NOTE — Progress Notes (Signed)
 Pharmacy Antibiotic Note  [name] [name] is a 63 y.o. female admitted on 06/27/2024 with sepsis.  Pharmacy has been consulted for Cefepime  & Vancomycin  dosing.  Plan: Cefepime  2gm IV q12h  Vancomycin  1250mg  IV q24h for estimated AUC 462 (goal 400-550) Monitor renal function and cx data      Temp (24hrs), Avg:100.5 F (38.1 C), Min:100.1 F (37.8 C), Max:100.9 F (38.3 C)  Recent Labs  Lab 06/22/24 0817 06/27/24 0014 06/27/24 0033 06/27/24 0120  WBC 3.1* 3.8*  --   --   CREATININE 1.55* 1.45* 1.60*  --   LATICACIDVEN  --   --   --  1.1    Estimated Creatinine Clearance: 56.5 mL/min (A) (by C-G formula based on SCr of 1.6 mg/dL (H)).    No Known Allergies  Antimicrobials this admission: 9/15 Cefepime  >>  9/15 Vancomycin  >>  9/15 Flagyl  x`  Dose adjustments this admission:  Microbiology results: 9/15 BCx:   Thank you for allowing pharmacy to be a part of this patient's care.  Rosaline Millet PharmD 06/27/2024 3:52 AM

## 2024-06-27 NOTE — Progress Notes (Signed)
 I met with [name] and her husband to offer initial support.  [name] was unable to speak, but her husband said her faith is very important to her.  I offered prayer, ministry of presence and provided logistical support in helping her daughter to find her way through the hospital.

## 2024-06-27 NOTE — ED Notes (Signed)
 Pt had to be pulled out of car by triage nurse, triage tech, and CT. Pt husband parked car in handicapped parking, asked to pull car to curb in order for us  to help his wife due to how far away the handicapped spot was, having no room in the spot, and having to go downhill to get pt up from car. This was for the safety of the pt. Pt husband yelled at triage nurse to just quit talking and do your job. Husband moved car, pt is being pulled out of car and is not verbally responsive, responds to painful stimuli. This RN is asking husband questions to better help the pt and he states if you could just stop asking questions and talking and do your job, just shut up This RN requested husband to go check pt in so we can start her care and he yells again at staff stating that he knows his wifes birthday and name and we all need to quit talking.

## 2024-06-27 NOTE — Plan of Care (Signed)

## 2024-06-27 NOTE — Procedures (Signed)
 Patient Name: [name]  MRN: [id number]  Epilepsy Attending: Arlin MALVA Krebs  Referring Physician/Provider: Germaine Raring, MD  Date: 06/27/2024 Duration: 34.23 mins  Patient history: 63 y.o. female with hx of metastatic breast cancer being followed by Dr. Odean, oncologist, hypertension, chronic kidney disease stage III was brought to the ER after patient's husband found that patient became suddenly confused and unable to talk and generally weak. EEG to evaluate for seizure  Level of alertness: Awake, asleep  AEDs during EEG study: Ativan   Technical aspects: This EEG study was done with scalp electrodes positioned according to the 10-20 International system of electrode placement. Electrical activity was reviewed with band pass filter of 1-70Hz , sensitivity of 7 uV/mm, display speed of 1mm/sec with a 60Hz  notched filter applied as appropriate. EEG data were recorded continuously and digitally stored.  Video monitoring was available and reviewed as appropriate.  Description: The posterior dominant rhythm consists of 8-9 Hz activity of moderate voltage (25-35 uV) seen predominantly in posterior head regions, symmetric and reactive to eye opening and eye closing. Sleep was characterized by vertex waves, sleep spindles (12 to 14 Hz), maximal frontocentral region.  Physiologic photic driving was not seen during photic stimulation.  Hyperventilation was not performed.     IMPRESSION: This study is within normal limits. No seizures or epileptiform discharges were seen throughout the recording.  A normal interictal EEG does not exclude the diagnosis of epilepsy.   [name] O [name]

## 2024-06-27 NOTE — ED Notes (Signed)
 Code stroke called @ 0020

## 2024-06-27 NOTE — Consult Note (Addendum)
 [facility] Consult Services  Stat Consult  Patient Name:   [name], [name] Date of Birth:   [birth year] Identification Number:   MRN - [id number] Date of Service:   06/27/2024 00:22:55  Diagnosis:       R47.89 - Other speech disturbances  Impression 63 yo F who presents with sudden inability to speak, altered mental status. On exam she has no usable speech and does not follow commands, and has no effort against gravity in any extremity. CTH, CTA Head and Neck show no acute findings including no hemorrhage and no LVO.  DDx includes toxic metabolic encephalopathy, sepsis related encephalopathy, stroke, brain mass, or functional neurologic disorder (conversion disorder).  I discussed with the patient's husband the risks and benefits of IV thrombolytics with assistance from the ED MD, given patient was in the window for IV thrombolysis and presented with sudden onset stroke-like deficits. The patient's husband declined IV thrombolytics.  Additionally, patient's husband reported the patient's breast cancer was metastatic but was unsure if the patient has any history of intracranial metastasis. He asked us  to call the patient's oncologist and we informed him this was not possible at this time of night as the office was closed. She has no recent MRI Brain records in our system either. At this time we are unable to rule out intracranial metastasis, which would be a potential thrombolytic contraindication.  Recommend MRI Brain w/wo contrast and agree with toxic metabolic and sepsis workup. No other signs of meningismus/meningitis, so OK to hold off on emergent LP for now from a neurologic perspective. Neurology will follow, further recs following MRI Brain w/wo contrast.   Dispositions : Neurology will follow    ----------------------------------------------------------------------------------------------------    Metrics: Dispatch Time: 06/27/2024  00:22:55 Callback Response Time: 06/27/2024 00:23:47  Primary Provider Notified of Diagnostic Impression and Management Plan on: 06/27/2024 00:56:42   CT HEAD: I personally reviewed all the CT images that were available to me and it showed: no acute hemorrhage   Imaging CT Head - personally reviewed and shows no acute hemorrhage or acute core infarct CTA Head and Neck - personally reviewed and shows no evidence of LVO.   ----------------------------------------------------------------------------------------------------  Chief Complaint: unable to speak  History of Present Illness: Patient is a 63 year old Female. Patient presents with an inability to speak. History obtained from patient's husband who is at bedside as the patient cannot speak to provide history. Patient was LSN around 2300 when she suddenly started to have difficulty speaking and altered mental status. She also became generally weak in both her arms and both her legs.  She has a history of breast cancer, recently recurred and patient's husband states it is metastatic. Patient's husband also stated he did not know where patient's breast cancer had metastasized to. He does not know if she has intracranial metastasis or not. He does not have any knowledge of the patient being on blood thinners.  Patient is also currently febrile on arrival.  Patient's husband was verbally aggressive during history taking which delayed the patient's assessment.   Stroke Alert Metrics: LKW time: 11:00 PM on 06/26/24 initial patient interaction: 12:45am 06/27/24 NIHSS completion time: 12:51am 06/27/24 decision time: 12:51am 06/27/24 Video end time: 12:55am 06/27/24    Past Medical History:      Hypertension Other PMH:  breast cancer, recent spider (as per pt) bite about 4 days ago, HTN, GERD  Medications:  No Anticoagulant use  No Antiplatelet use Reviewed EMR for current medications  Allergies:  Reviewed  Social  History: Smoking: No  Family History:  There is no family history of premature cerebrovascular disease pertinent to this consultation  ROS : 14 Points Review of Systems was performed and was negative except mentioned in HPI.  Past Surgical History: There Is No Surgical History Contributory To Today's Visit    Examination: BP(151/129), Pulse(112), 1A: Level of Consciousness - Arouses to minor stimulation + 1 1B: Ask Month and Age - Could Not Answer Either Question Correctly + 2 1C: Blink Eyes & Squeeze Hands - Performs 0 Tasks + 2 2: Test Horizontal Extraocular Movements - Normal + 0 3: Test Visual Fields - No Visual Loss + 0 4: Test Facial Palsy (Use Grimace if Obtunded) - Normal symmetry + 0 5A: Test Left Arm Motor Drift - No Effort Against Gravity + 3 5B: Test Right Arm Motor Drift - No Effort Against Gravity + 3 6A: Test Left Leg Motor Drift - No Effort Against Gravity + 3 6B: Test Right Leg Motor Drift - No Effort Against Gravity + 3 7: Test Limb Ataxia (FNF/Heel-Shin) - No Ataxia + 0 8: Test Sensation - Normal; No sensory loss + 0 9: Test Language/Aphasia - Severe Aphasia: Fragmentary Expression, Inference Needed, Cannot Identify Materials + 2 10: Test Dysarthria - Normal + 0 11: Test Extinction/Inattention - No abnormality + 0  NIHSS Score: 19 NIHSS Free Text : patient will occasionally yell out but no usable speech or comprehension seen. Will move arms away from her face when arms are held up, but will not maintain against gravity. No other focal deficits seen.  Spoke with : Dr. Carita    This consult was conducted in real time using interactive audio and Immunologist. Patient was informed of the technology being used for this visit and agreed to proceed. Patient located in hospital and provider located at home/office setting.  Patient is being evaluated for possible acute neurologic impairment and high probability of imminent or life - threatening deterioration.I  spent total of 75 minutes providing care to this patient, including time for face to face visit via telemedicine, review of medical records, imaging studies and discussion of findings with providers, the patient and / or family.   Dr Venus Queen     [facility] For Inpatient follow-up with [facility] physician please call [facility] at [phone]. As we are not an outpatient service for any post hospital discharge needs please contact the hospital for assistance.  If you have any questions for the [facility] physicians or need to reconsult for clinical or diagnostic changes please contact us  via [facility] at [phone].   Signature : Venus Queen

## 2024-06-27 NOTE — Progress Notes (Signed)
 MEWS Progress Note  Patient Details Name: [name] MRN: [id number] DOB: 1961-08-28 Today's Date: 06/27/2024   MEWS Flowsheet Documentation:  Assess: MEWS Score Temp: (!) 100.9 F (38.3 C) BP: (!) 156/100 MAP (mmHg): 114 Pulse Rate: (!) 115 ECG Heart Rate: (!) 116 Resp: 18 Level of Consciousness: Responds to Pain SpO2: 98 % O2 Device: Room Air Assess: MEWS Score MEWS Temp: 1 MEWS Systolic: 0 MEWS Pulse: 2 MEWS RR: 0 MEWS LOC: 2 MEWS Score: 5 MEWS Score Color: Red Assess: SIRS CRITERIA SIRS Temperature : 0 SIRS Respirations : 0 SIRS Pulse: 1 SIRS WBC: 0 SIRS Score Sum : 1 SIRS Temperature : 0 SIRS Pulse: 1 SIRS Respirations : 0 SIRS WBC: 0 SIRS Score Sum : 1 Assess: if the MEWS score is Yellow or Red Were vital signs accurate and taken at a resting state?: Yes Does the patient meet 2 or more of the SIRS criteria?: Yes Does the patient have a confirmed or suspected source of infection?: Yes MEWS guidelines implemented : Yes, red Treat MEWS Interventions: Considered administering scheduled or prn medications/treatments as ordered Take Vital Signs Increase Vital Sign Frequency : Red: Q1hr x2, continue Q4hrs until patient remains green for 12hrs Escalate MEWS: Escalate: Red: Discuss with charge nurse and notify provider. Consider notifying RRT. If remains red for 2 hours consider need for higher level of care   Received patient from the ED on a stretcher accompanied with ED NT and patient husband. Upon arrival to the unit patient not arousable, sternal rub performed and increase stimulation. Patient alert and able to follow commands.  Algie Cales Clariss L Maggie Senseney 06/27/2024, 4:09 AM

## 2024-06-27 NOTE — TOC Progression Note (Deleted)
 Transition of Care [facility]) - Progression Note   Patient Details  Name: [name] MRN: [id number] Date of Birth: 06/21/1961  [facility] of [facility] CM/SW Contact  Duwaine GORMAN Aran, LCSW Phone Number: 06/27/2024, 3:32 PM  Clinical Narrative: Care management consulted for ETOH residential referral. Patient is agreeable to referral being faxed to [facility], but is aware he will have to complete a screening with [facility] on the phone as well as to call daily for bed availability if they are willing to admit him. CSW spoke with Nanetta at [facility], who provided fax for referral [phone]). Referral faxed to [facility] and was received at 3:27pm.  Expected Discharge Plan: Home/Self Care Barriers to Discharge: Continued Medical Work up  Expected Discharge Plan and Services In-house Referral: Clinical Social Work Living arrangements for the past 2 months: Single Family Home              DME Arranged: N/A DME Agency: NA  Social Drivers of Health (SDOH) Interventions SDOH Screenings   Food Insecurity: No Food Insecurity (01/10/2024)  Housing: Low Risk  (01/10/2024)  Transportation Needs: No Transportation Needs (01/10/2024)  Utilities: Not At Risk (01/10/2024)  Social Connections: Socially Integrated (01/10/2024)  Tobacco Use: Low Risk  (06/27/2024)   Readmission Risk Interventions     No data to display

## 2024-06-27 NOTE — Progress Notes (Signed)
 Around 2030 while performing the NIH Stroke Assessment on the patient, the patient's husband interrupted me in a very rude and loud way. He told me to stop what I was doing, that this was just a game we have been playing on her the whole night and day. He stated that the patient doesn't need any type of assessment done on her since her brain is just fine, that we are doing more wrong than good on her. I tried to educate them on the importance of this assessment but he screamed at me and told me to get out of the room. The patient seemed very annoyed at the point, I tried to explain that we were just performing this assessment every 12 hours now, but the husband became more agitated and verbally aggressive so I decided to step out of the room at that point. The NP was notified of the reason for the incomplete assessment. The charge nurse is aware of the situation as well.     Jaye Harrier, RN

## 2024-06-27 NOTE — Plan of Care (Signed)
 Patient seen by telemedicine evaluation x 2 over the last 12 hours or so I was able to evaluate the patient since I was in the ICU to evaluate another patient and was asked by Dr. Jadine on follow-up on the recommendations by Dr. Germaine. The patient seemed awake and tracking the examiner, she was making some gurgling sounds but not really verbalizing.  She was seemingly able to comprehend but did not follow commands or move any of the extremities.  She grimace to noxious stimulation in all fours. Her brain imaging-CT head, CT angio head and neck and MRI brain with and without contrast are all unremarkable for acute process. I agree with Dr. Ethan will obtain an EEG, look for any kind of infectious workup that might be causing altered mental status. I will follow with you tomorrow. Plan was discussed with Dr. Jadine on the unit  Eligio Lav, MD Neurology

## 2024-06-27 NOTE — ED Triage Notes (Signed)
 Pt arrived POV, unable to walk or talk, x1 hr ago. Cancer pt, is currently on ABT for insect bite on right leg. Febrile with rectal temp of 100.1.

## 2024-06-27 NOTE — H&P (Signed)
 History and Physical    [name] FMW:[id number] DOB: [birth year] DOA: 06/27/2024  Patient coming from: Home.  Chief Complaint: Sudden onset of weakness and difficulty talking.  HPI: [name] is a 63 y.o. female with history of metastatic breast cancer being followed by Dr. Odean, oncologist, hypertension, chronic kidney disease stage III was brought to the ER after patient's husband found that patient became suddenly confused and unable to talk and generally weak.  This happened around 10:30 PM at home last evening.  About 3 days ago patient noticed rash on the right thigh suspected to be an insect bite and also on the right upper extremity.  Patient had gone to urgent care 2 days ago and was prescribed Keflex  which patient has taken 3 doses.  Denies any nausea vomiting diarrhea chest pain shortness of breath or productive cough.  Patient is feeling generally weak and has pain on moving the right hip area.  ED Course: In the ER patient was evaluated by teleneurologist for stroke.  Family had declined thrombolytics.  Neurology at this time want to rule out sepsis and also get MRI brain with and without contrast.  In the ER patient was tachycardic hypertensive with temperature of 100.9 F.  CT angiogram head and neck was negative for large vessel obstruction does show possibility of infiltrates in the left lung.  Chest x-ray is unremarkable UA is pending.  Blood cultures were sent patient started on empiric antibiotics.  Labs show creatinine of 1.4 and pancytopenia with blood counts at around recent.  COVID and flu test were negative.  Patient also received Ativan .  Patient slowly started getting her strength back was able to talk and able to move her extremities but still her right lower extremity is restricted because of the pain.  Review of Systems: As per HPI, rest all negative.   Past Medical History:  Diagnosis Date   Complication of anesthesia    PONV    Fatigue    GERD (gastroesophageal reflux disease)    w nonobstructing esophageal stricture   Headache    Heat intolerance    History of ETT 03/2009   myoview EF 65%, normal wall motion, no ischemia or infarction, poor exercise capacity   HTN (hypertension)    left breast ca dx'd 10/2018   breast- left   Motion sickness    Normal echocardiogram 03/2009   EF 60-65% moderate diastolic dysfunction, mild LAE   Numbness and tingling    Obesity    PONV (postoperative nausea and vomiting)    S/P partial hysterectomy     Past Surgical History:  Procedure Laterality Date   ABDOMINAL HYSTERECTOMY  2007   partial    AXILLARY LYMPH NODE DISSECTION Left 03/11/2019   Procedure: LEFT AXILLARY LYMPH NODE DISSECTION;  Surgeon: Vanderbilt Ned, MD;  Location: [facility];  Service: General;  Laterality: Left;   BREAST LUMPECTOMY WITH RADIOACTIVE SEED AND SENTINEL LYMPH NODE BIOPSY Left 02/22/2019   Procedure: LEFT BREAST LUMPECTOMY x2 WITH RADIOACTIVE SEED AND LEFT AXILLA SEED  GUIDED LYMPH NODE BIOPSY AND LEFT SENTINEL LYMPH NODE MAPPING;  Surgeon: Vanderbilt Ned, MD;  Location: [facility];  Service: General;  Laterality: Left;   BRONCHIAL BIOPSY  08/10/2023   Procedure: BRONCHIAL BIOPSIES;  Surgeon: Brenna Adine CROME, DO;  Location: [facility];  Service: Pulmonary;;   CARPAL TUNNEL RELEASE Bilateral 2018   FOOT SURGERY Bilateral 2016   hysterectomy (other)     Left ear lobe  repair Left 05/25/2024   PORT-A-CATH REMOVAL N/A 02/22/2019   Procedure: REMOVAL PORT-A-CATH;  Surgeon: Vanderbilt Ned, MD;  Location: [facility];  Service: General;  Laterality: N/A;   PORTACATH PLACEMENT N/A 11/10/2018   Procedure: INSERTION PORT-A-CATH WITH ULTRASOUND;  Surgeon: Vanderbilt Ned, MD;  Location: [facility];  Service: General;  Laterality: N/A;   TONSILLECTOMY  1968   UPPER GASTROINTESTINAL ENDOSCOPY       reports that she has never smoked. She has never been  exposed to tobacco smoke. She has never used smokeless tobacco. She reports that she does not drink alcohol and does not use drugs.  No Known Allergies  Family History  Problem Relation Age of Onset   Heart attack Mother 54   Heart disease Mother    Diabetes Sister    Heart attack Maternal Grandmother        age 74 or 46   Heart disease Maternal Grandmother    Neuropathy Neg Hx    Colon cancer Neg Hx    Colon polyps Neg Hx    Esophageal cancer Neg Hx    Stomach cancer Neg Hx    Rectal cancer Neg Hx     Prior to Admission medications   Medication Sig Start Date End Date Taking? Authorizing Provider  abemaciclib  (VERZENIO ) 100 MG tablet Take 1 tablet (100 mg total) by mouth 2 (two) times daily. 03/15/24   Odean Potts, MD  atorvastatin  (LIPITOR) 10 MG tablet Take 1 tablet by mouth daily.    [provider]  carvedilol  (COREG ) 6.25 MG tablet TAKE 1 TABLET BY MOUTH TWICE DAILY 03/03/15   Rudy Carlin LABOR, MD  diclofenac  Sodium (VOLTAREN ) 1 % GEL Apply 4 g topically 4 (four) times daily. 09/17/23   McDonald, Juliene SAUNDERS, DPM  diphenoxylate -atropine  (LOMOTIL ) 2.5-0.025 MG tablet Take 1 tablet by mouth 4 (four) times daily as needed for diarrhea or loose stools. 01/19/24   Gudena, Vinay, MD  letrozole  (FEMARA ) 2.5 MG tablet Take 1 tablet (2.5 mg total) by mouth daily. 08/14/23   Gudena, Vinay, MD  lisinopril -hydrochlorothiazide  (ZESTORETIC ) 10-12.5 MG tablet Take 1 tablet by mouth daily.    [provider]  naproxen  (NAPROSYN ) 500 MG tablet Take 500 mg by mouth daily as needed for moderate pain (pain score 4-6).    [provider]  ondansetron  (ZOFRAN ) 4 MG tablet TAKE 1 TABLET BY MOUTH EVERY 8 HOURS AS NEEDED FOR NAUSEA AND VOMITING Patient not taking: Reported on 06/22/2024 12/14/23   Gudena, Vinay, MD  pantoprazole  (PROTONIX ) 40 MG tablet TAKE 1 TABLET BY MOUTH EVERY DAY 04/11/24   Odean Potts, MD  Vitamin D , Ergocalciferol , (DRISDOL ) 1.25 MG (50000 UNIT) CAPS capsule TAKE  1 CAPSULE (50,000 UNITS TOTAL) BY MOUTH EVERY 7 (SEVEN) DAYS 09/28/23   Silva Juliene SAUNDERS, DPM    Physical Exam: Constitutional: Moderately built and nourished. Vitals:   06/27/24 0021 06/27/24 0102 06/27/24 0200 06/27/24 0306  BP:   (!) 153/113 (!) 156/100  Pulse: (!) 107  (!) 113 (!) 115  Resp: 20  19 18   Temp:  100.1 F (37.8 C)  (!) 100.9 F (38.3 C)  TempSrc:  Rectal  Oral  SpO2: 100%  100% 98%   Eyes: Anicteric no pallor. ENMT: No discharge from the ears eyes nose or mouth. Neck: No mass felt.  No neck rigidity. Respiratory: No rhonchi or crepitations. Cardiovascular: S1-S2 heard. Abdomen: Soft nontender bowel sound present. Musculoskeletal: Mild tenderness to right thigh area. Skin: Rash in the right  thigh and the right upper extremity. Neurologic: Alert awake oriented time place and person.  Moving all extremity generally weak has pain on moving the right lower extremity. Psychiatric: Alert awake oriented time place and person.   Labs on Admission: I have personally reviewed following labs and imaging studies  CBC: Recent Labs  Lab 06/22/24 0817 06/27/24 0014 06/27/24 0033  WBC 3.1* 3.8*  --   NEUTROABS 1.6*  --   --   HGB 11.1* 10.7* 10.9*  HCT 33.1* 33.9* 32.0*  MCV 91.4 95.5  --   PLT 145* 137*  --    Basic Metabolic Panel: Recent Labs  Lab 06/22/24 0817 06/27/24 0014 06/27/24 0033  NA 140 136 137  K 3.9 3.6 3.7  CL 106 100 102  CO2 30 24  --   GLUCOSE 102* 128* 122*  BUN 22 21 22   CREATININE 1.55* 1.45* 1.60*  CALCIUM  9.5 9.6  --    GFR: Estimated Creatinine Clearance: 56.5 mL/min (A) (by C-G formula based on SCr of 1.6 mg/dL (H)). Liver Function Tests: Recent Labs  Lab 06/22/24 0817 06/27/24 0014  AST 15 23  ALT 13 19  ALKPHOS 67 81  BILITOT 0.4 0.6  PROT 7.2 7.3  ALBUMIN 4.0 4.2   Recent Labs  Lab 06/27/24 0120  LIPASE 25   Recent Labs  Lab 06/27/24 0120  AMMONIA 24   Coagulation Profile: Recent Labs  Lab 06/27/24 0014   INR 1.2   Cardiac Enzymes: No results for input(s): CKTOTAL, CKMB, CKMBINDEX, TROPONINI in the last 168 hours. BNP (last 3 results) Recent Labs    06/27/24 0120  PROBNP 73.7   HbA1C: No results for input(s): HGBA1C in the last 72 hours. CBG: Recent Labs  Lab 06/27/24 0014  GLUCAP 118*   Lipid Profile: No results for input(s): CHOL, HDL, LDLCALC, TRIG, CHOLHDL, LDLDIRECT in the last 72 hours. Thyroid  Function Tests: Recent Labs    06/27/24 0014  TSH 2.890   Anemia Panel: No results for input(s): VITAMINB12, FOLATE, FERRITIN, TIBC, IRON, RETICCTPCT in the last 72 hours. Urine analysis:    Component Value Date/Time   COLORURINE YELLOW 01/10/2024 1801   APPEARANCEUR CLEAR 01/10/2024 1801   APPEARANCEUR Clear 07/07/2014 0204   LABSPEC 1.008 01/10/2024 1801   LABSPEC 1.011 07/07/2014 0204   PHURINE 6.0 01/10/2024 1801   GLUCOSEU NEGATIVE 01/10/2024 1801   GLUCOSEU Negative 07/07/2014 0204   HGBUR MODERATE (A) 01/10/2024 1801   BILIRUBINUR NEGATIVE 01/10/2024 1801   BILIRUBINUR Negative 07/07/2014 0204   KETONESUR 5 (A) 01/10/2024 1801   PROTEINUR NEGATIVE 01/10/2024 1801   UROBILINOGEN 0.2 08/17/2007 1555   NITRITE NEGATIVE 01/10/2024 1801   LEUKOCYTESUR SMALL (A) 01/10/2024 1801   LEUKOCYTESUR Negative 07/07/2014 0204   Sepsis Labs: @LABRCNTIP (procalcitonin:4,lacticidven:4) ) Recent Results (from the past 240 hours)  Resp panel by RT-PCR (RSV, Flu A&B, Covid) Anterior Nasal Swab     Status: None   Collection Time: 06/27/24  1:33 AM   Specimen: Anterior Nasal Swab  Result Value Ref Range Status   SARS Coronavirus 2 by RT PCR NEGATIVE NEGATIVE Final    Comment: (NOTE) SARS-CoV-2 target nucleic acids are NOT DETECTED.  The SARS-CoV-2 RNA is generally detectable in upper respiratory specimens during the acute phase of infection. The lowest concentration of SARS-CoV-2 viral copies this assay can detect is 138 copies/mL. A  negative result does not preclude SARS-Cov-2 infection and should not be used as the sole basis for treatment or other patient management decisions. A  negative result may occur with  improper specimen collection/handling, submission of specimen other than nasopharyngeal swab, presence of viral mutation(s) within the areas targeted by this assay, and inadequate number of viral copies(<138 copies/mL). A negative result must be combined with clinical observations, patient history, and epidemiological information. The expected result is Negative.  Fact Sheet for Patients:  BloggerCourse.com  Fact Sheet for Healthcare Providers:  SeriousBroker.it  This test is no t yet approved or cleared by the [place]  FDA and  has been authorized for detection and/or diagnosis of SARS-CoV-2 by FDA under an Emergency Use Authorization (EUA). This EUA will remain  in effect (meaning this test can be used) for the duration of the COVID-19 declaration under Section 564(b)(1) of the Act, 21 U.S.C.section 360bbb-3(b)(1), unless the authorization is terminated  or revoked sooner.       Influenza A by PCR NEGATIVE NEGATIVE Final   Influenza B by PCR NEGATIVE NEGATIVE Final    Comment: (NOTE) The Xpert Xpress SARS-CoV-2/FLU/RSV plus assay is intended as an aid in the diagnosis of influenza from Nasopharyngeal swab specimens and should not be used as a sole basis for treatment. Nasal washings and aspirates are unacceptable for Xpert Xpress SARS-CoV-2/FLU/RSV testing.  Fact Sheet for Patients: BloggerCourse.com  Fact Sheet for Healthcare Providers: SeriousBroker.it  This test is not yet approved or cleared by the [place]  FDA and has been authorized for detection and/or diagnosis of SARS-CoV-2 by FDA under an Emergency Use Authorization (EUA). This EUA will remain in effect (meaning this test can  be used) for the duration of the COVID-19 declaration under Section 564(b)(1) of the Act, 21 U.S.C. section 360bbb-3(b)(1), unless the authorization is terminated or revoked.     Resp Syncytial Virus by PCR NEGATIVE NEGATIVE Final    Comment: (NOTE) Fact Sheet for Patients: BloggerCourse.com  Fact Sheet for Healthcare Providers: SeriousBroker.it  This test is not yet approved or cleared by the [place]  FDA and has been authorized for detection and/or diagnosis of SARS-CoV-2 by FDA under an Emergency Use Authorization (EUA). This EUA will remain in effect (meaning this test can be used) for the duration of the COVID-19 declaration under Section 564(b)(1) of the Act, 21 U.S.C. section 360bbb-3(b)(1), unless the authorization is terminated or revoked.  Performed at [facility], [address] [place], [place] [address]      Radiological Exams on Admission: DG Chest Portable 1 View Result Date: 06/27/2024 CLINICAL DATA:  Altered mental status EXAM: PORTABLE CHEST 1 VIEW COMPARISON:  08/10/2023 FINDINGS: Cardiac shadow is enlarged but stable. The lungs are well aerated bilaterally. No focal infiltrate is seen. Stable scarring in the left mid lung is noted. No bony abnormality is seen. IMPRESSION: No active disease. Electronically Signed   By: Oneil Devonshire M.D.   On: 06/27/2024 02:10   DG Femur Min 2 Views Right Result Date: 06/27/2024 CLINICAL DATA:  Right leg insect bite with pain and swelling, initial encounter EXAM: RIGHT FEMUR 2 VIEWS COMPARISON:  None Available. FINDINGS: No acute fracture or dislocation is noted. Degenerative changes of the knee joint are seen. No radiopaque foreign body is noted. No soft tissue abnormality is seen. IMPRESSION: No acute abnormality noted. Electronically Signed   By: Oneil Devonshire M.D.   On: 06/27/2024 02:10   CT ANGIO HEAD NECK W WO CM Result Date: 06/27/2024 CLINICAL DATA:   Initial evaluation for acute neuro deficit, stroke. EXAM: CT ANGIOGRAPHY HEAD AND NECK WITH AND WITHOUT CONTRAST TECHNIQUE: Multidetector CT imaging  of the head and neck was performed using the standard protocol during bolus administration of intravenous contrast. Multiplanar CT image reconstructions and MIPs were obtained to evaluate the vascular anatomy. Carotid stenosis measurements (when applicable) are obtained utilizing NASCET criteria, using the distal internal carotid diameter as the denominator. RADIATION DOSE REDUCTION: This exam was performed according to the departmental dose-optimization program which includes automated exposure control, adjustment of the mA and/or kV according to patient size and/or use of iterative reconstruction technique. CONTRAST:  75mL OMNIPAQUE  IOHEXOL  350 MG/ML SOLN COMPARISON:  CT from earlier the same day. FINDINGS: CTA NECK FINDINGS Aortic arch: Visualized aortic arch within normal limits for caliber. Bovine branching pattern noted. No stenosis about the origin the great vessels. Right carotid system: No evidence of dissection, stenosis (50% or greater), or occlusion. Left carotid system: No evidence of dissection, stenosis (50% or greater), or occlusion. Vertebral arteries: No evidence of dissection, stenosis (50% or greater), or occlusion. Skeleton: No discrete or worrisome osseous lesions. Moderately advanced spondylosis at C5-6 and C6-7. Mild osteoarthritic changes noted about the TMJs bilaterally. Other neck: No other acute finding. Few small right thyroid  nodules noted, largest of which measures 1.4 cm on the right. These are of doubtful significance given size and patient age, with no follow-up imaging recommended (ref: J Am Coll Radiol. 2015 Feb;12(2): 143-50). Upper chest: Streaky densities noted within the partially visualized left upper lobe, favored to reflect atelectasis, although infiltrate not excluded. Probable sequelae of prior left axillary nodal dissection  noted. Review of the MIP images confirms the above findings CTA HEAD FINDINGS Anterior circulation: Atheromatous change about the carotid siphons without hemodynamically significant stenosis. A1 segments, anterior communicating artery complex common anterior cerebral arteries patent without stenosis. No M1 stenosis or occlusion. No proximal MCA branch occlusion. Distal MCA branches perfused and symmetric. Posterior circulation: Both V4 segments patent without stenosis. Left vertebral artery dominant. Both PICA patent. Basilar patent without stenosis. Superior cerebellar and posterior cerebral arteries widely patent bilaterally. Venous sinuses: Patent allowing for timing the contrast bolus. Anatomic variants: None significant.  No aneurysm. Review of the MIP images confirms the above findings IMPRESSION: 1. Negative CTA for large vessel occlusion or other emergent finding. 2. Mild atheromatous change about the carotid siphons without hemodynamically significant stenosis. 3. Streaky densities within the partially visualized left upper lobe, favored to reflect atelectasis, although infiltrate could also be considered in the correct clinical setting. Correlation with plain film radiography suggested as warranted. Electronically Signed   By: Morene Hoard M.D.   On: 06/27/2024 01:15   CT HEAD CODE STROKE WO CONTRAST Result Date: 06/27/2024 CLINICAL DATA:  Code stroke. Initial evaluation for acute neuro deficit, stroke suspected. EXAM: CT HEAD WITHOUT CONTRAST TECHNIQUE: Contiguous axial images were obtained from the base of the skull through the vertex without intravenous contrast. RADIATION DOSE REDUCTION: This exam was performed according to the departmental dose-optimization program which includes automated exposure control, adjustment of the mA and/or kV according to patient size and/or use of iterative reconstruction technique. COMPARISON:  None Available. FINDINGS: Brain: Cerebral volume within normal  limits for patient age. No acute intracranial hemorrhage. No acute large vessel territory infarct. No mass lesion, midline shift, or mass effect. Ventricles are normal in size without hydrocephalus. No extra-axial fluid collection. Vascular: No abnormal hyperdense vessel. Skull: Scalp soft tissues demonstrate no acute abnormality. Calvarium intact. Sinuses/Orbits: Globes and orbital soft tissues within normal limits. Visualized paranasal sinuses are largely clear. No significant mastoid effusion. ASPECTS [facility] Stroke Program Early  CT Score) - Ganglionic level infarction (caudate, lentiform nuclei, internal capsule, insula, M1-M3 cortex): 7 - Supraganglionic infarction (M4-M6 cortex): 3 Total score (0-10 with 10 being normal): 10 IMPRESSION: 1. No acute intracranial abnormality. 2. ASPECTS is 10. Results were called by telephone at the time of interpretation on 06/27/2024 at 12:38 am to provider [facility] , who verbally acknowledged these results. Electronically Signed   By: Morene Hoard M.D.   On: 06/27/2024 00:41    EKG: Independently reviewed.  Normal sinus rhythm.  Assessment/Plan Principal Problem:   Sepsis (HCC) Active Problems:   Malignant neoplasm of upper-outer quadrant of left breast in female, estrogen receptor positive (HCC)   Essential hypertension   HLD (hyperlipidemia)   Acute encephalopathy   CKD (chronic kidney disease) stage 3, GFR 30-59 ml/min (HCC)    Possible sepsis -    on arrival patient was tachycardic with temperature of 100.9 F encephalopathic.  Concerning for developing sepsis.  Source could be right thigh cellulitis.  UA still pending.  Given insect bite will also check RMSF titer Lyme titers.  Follow cultures.  I am ordering a CT of the right thigh given that patient still has pain on moving the right thigh and also check CK levels.  On empiric antibiotics. Acute encephalopathy likely from sepsis.  Appreciate neurology consult.  Neurology requesting MRI  brain with and without contrast.  If creatinine worsens then may have to do MRI brain without the contrast.  Will also check EEG. Hypertension uncontrolled takes Coreg  and lisinopril  holding hydrochlorothiazide  while receiving fluids. Chronic kidney disease stage III creatinine at around baseline.  Closely monitor. Hyperlipidemia on statins.  If CK levels are elevated may need to hold statins. History of breast cancer being followed by Dr. Odean presently on Verzenio  and letrozole .  Follow MRI brain with and without contrast.  Patient has had recent CT chest abdomen pelvis on June 15, 2024. Pancytopenia appears to be chronic.  May be related to patient's cancer therapy.  Closely monitor.  Since patient has sepsis will need close monitoring and further workup and more than 2 midnight stay.   DVT prophylaxis: Lovenox . Code Status: Full code. Family Communication: Husband. Disposition Plan: Progressive care. Consults called: Neurology. Admission status: Inpatient.

## 2024-06-28 DIAGNOSIS — R4182 Altered mental status, unspecified: Secondary | ICD-10-CM | POA: Diagnosis not present

## 2024-06-28 DIAGNOSIS — Z170 Estrogen receptor positive status [ER+]: Secondary | ICD-10-CM

## 2024-06-28 DIAGNOSIS — G9340 Encephalopathy, unspecified: Secondary | ICD-10-CM | POA: Diagnosis not present

## 2024-06-28 DIAGNOSIS — C50412 Malignant neoplasm of upper-outer quadrant of left female breast: Secondary | ICD-10-CM

## 2024-06-28 DIAGNOSIS — W57XXXD Bitten or stung by nonvenomous insect and other nonvenomous arthropods, subsequent encounter: Secondary | ICD-10-CM | POA: Diagnosis not present

## 2024-06-28 DIAGNOSIS — L03115 Cellulitis of right lower limb: Secondary | ICD-10-CM

## 2024-06-28 DIAGNOSIS — L0390 Cellulitis, unspecified: Secondary | ICD-10-CM | POA: Insufficient documentation

## 2024-06-28 LAB — SPOTTED FEVER GROUP ANTIBODIES
Spotted Fever Group IgG: <1:64 {titer}
Spotted Fever Group IgM: <1:64 {titer}

## 2024-06-28 LAB — LYME DISEASE SEROLOGY W/REFLEX: Lyme Total Antibody EIA: NEGATIVE

## 2024-06-28 MED ORDER — MORPHINE SULFATE (PF) 2 MG/ML IV SOLN
INTRAVENOUS | Status: AC
  Filled 2024-06-28: qty 1

## 2024-06-28 MED ORDER — MORPHINE SULFATE (PF) 2 MG/ML IV SOLN
1.0000 mg | Freq: Once | INTRAVENOUS | Status: AC
  Administered 2024-06-28: 1 mg via INTRAVENOUS

## 2024-06-28 MED ORDER — AMOXICILLIN-POT CLAVULANATE 875-125 MG PO TABS
1.0000 | ORAL_TABLET | Freq: Two times a day (BID) | ORAL | Status: DC
  Administered 2024-06-28 – 2024-06-29 (×3): 1 via ORAL
  Filled 2024-06-28 (×3): qty 1

## 2024-06-28 MED ORDER — HYDROCODONE-ACETAMINOPHEN 5-325 MG PO TABS
1.0000 | ORAL_TABLET | Freq: Four times a day (QID) | ORAL | Status: DC | PRN
  Administered 2024-06-28 – 2024-06-29 (×3): 1 via ORAL
  Filled 2024-06-28 (×3): qty 1

## 2024-06-28 NOTE — Progress Notes (Signed)
 I provided follow up support for [name] who is grateful to be able communicate today.  She reflected on how scary yesterday had been.  She shared about her family, her faith, her career as a high Engineer, site, and her positive outlook through this cancer diagnosis. I provided support through listening and facilitating sharing as well as prayer.

## 2024-06-28 NOTE — Progress Notes (Addendum)
 Progress Note   Patient: [name] FMW:[id number] DOB: 13-Aug-1961 DOA: 06/27/2024     1 DOS: the patient was seen and examined on 06/28/2024   Brief hospital course: 63 year old woman PMH including metastatic breast cancer not on chemotherapy presented to the emergency department with report of becoming suddenly nonverbal, not following commands although apparently able to walk to her car.  Per EDP exam was not following commands or speaking protected face from following arm but no movement against gravity.  CT head and CTA neck unrevealing, seen by teleneurology, TNK was offered but husband declined, was treated with Ativan  for possible seizure activity.  Apparently some concern for sepsis for unclear reasons.  Teleneurology consultation no usable speech, did not follow commands.  TNK considered but husband declined.  MRI brain recommended.  Also apparently with some kind of insect bite to right thigh several days prior, treated with Keflex  as an outpatient.  Low-grade temperature.  Exam of admitting physician reportedly alert, awake oriented to time place and person, moving all extremities but generally weak with pain on the right.  Admitted for possible sepsis without definite source of infection, consideration given to thigh cellulitis; acute encephalopathy.  Consultants Neurology   Procedures/Events 9/15 admit for encephalopathy, possible sepsis.  Apparently a rapid response was called overnight.  Approximately 1325 patient was at normal baseline when she suddenly developed some facial twitching, became completely nonverbal and not following commands.  Assessment and Plan: Acute encephalopathy Prior to admission patient with episode of becoming nonverbal, not following commands.  In the emergency department EDP documented the same, was seen by teleneurology and has been declined intervention.  CT imaging was negative for LVO and acute abnormalities.  By the time patient seen by  admitting physician described as being alert and oriented and following commands.  Subsequent MRI brain was negative Had a second episode 9/15, patient was at baseline today when she suddenly again became nonverbal, not following commands, mouth twitching.  Code stroke was called, CT head was negative, patient seen by teleneurology with recommendation for EEG.  EEG was unremarkable.  Transferred to stepdown unit for close monitoring. Encephalopathy appears resolved today.  Etiology unclear.   SIRS Right thigh cellulitis Possible sepsis described on admission, with low-grade temperature, tachycardia, possible right sided thigh cellulitis.  Possible insect bite. Imaging was ordered of the thigh but will defer at this time based on exam there is no evidence of complicating features I do not see any signs or symptoms of sepsis at this time.  Thigh looks to have a local reaction, not clearly cellulitis. Will narrow antibiotics to doxycycline  and Augmentin , stop cefepime .   Hypertension Stable   Renal insufficiency CKD stage IIIa Baseline creatinine is about 1.2, was 1.45 or 1.6 on admission   Metastatic breast cancer to lung  Followed by Dr. Odean presently on Verzenio  and letrozole .    Thrombocytopenia Anemia Thrombocytopenia has been present for several months, will follow clinically. Hemoglobin stable  Obesity class II  Body mass index is 39.41 kg/m.  Overall much improved.  Check labs in AM.  Can likely transfer out of stepdown tomorrow if stable.    Subjective:  Feels better today.  She somewhat recalls events yesterday she was suddenly unable to speak or move her body.  This was her second event.  [place] not go out into the woods.  She ended up having some kind of insect bites she thinks right thigh which had  surrounding induration and erythema as well as a similar lesion on her forearm.  Physical Exam: Vitals:   06/28/24 0649 06/28/24 0700  06/28/24 0730 06/28/24 0800  BP: (!) 139/90 132/85  (!) 138/92  Pulse: 62 65  86  Resp: 14 14  17   Temp:   98.2 F (36.8 C)   TempSrc:   Oral   SpO2: 98% 100%  100%  Weight:      Height:       Physical Exam Vitals reviewed.  Constitutional:      General: She is not in acute distress.    Appearance: She is not ill-appearing or toxic-appearing.  Cardiovascular:     Rate and Rhythm: Normal rate and regular rhythm.     Heart sounds: No murmur heard. Pulmonary:     Effort: Pulmonary effort is normal. No respiratory distress.     Breath sounds: No wheezing, rhonchi or rales.  Musculoskeletal:     Right lower leg: No edema.     Left lower leg: No edema.  Skin:    Comments: Right thigh with 2 small eschars, surrounding erythema nearly gone.  1 eschar right forearm, mild surrounding erythema.  Neurological:     General: No focal deficit present.     Mental Status: She is alert.  Psychiatric:        Mood and Affect: Mood normal.        Behavior: Behavior normal.     Data Reviewed: No new data  Family Communication: none present  Disposition: Status is: Inpatient Remains inpatient appropriate because: encephalopathy     Time spent: 20 minutes  Author: Toribio Door, MD 06/28/2024 9:17 AM  For on call review [email or web address].

## 2024-06-28 NOTE — Plan of Care (Signed)
  Problem: Education: Goal: Knowledge of General Education information will improve Description: Including pain rating scale, medication(s)/side effects and non-pharmacologic comfort measures Outcome: Progressing   Problem: Clinical Measurements: Goal: Cardiovascular complication will be avoided Outcome: Progressing   Problem: Nutrition: Goal: Adequate nutrition will be maintained Outcome: Progressing   Problem: Elimination: Goal: Will not experience complications related to urinary retention Outcome: Progressing   Problem: Pain Managment: Goal: General experience of comfort will improve and/or be controlled Outcome: Progressing   Problem: Safety: Goal: Ability to remain free from injury will improve Outcome: Progressing

## 2024-06-28 NOTE — Progress Notes (Signed)
 NEUROLOGY CONSULT FOLLOW UP NOTE   Date of service: June 28, 2024 Patient Name: [name] MRN:  [id number] DOB:  17-Apr-1961  Interval Hx/subjective  Seen and examined. Back to baseline Vitals   Vitals:   06/28/24 1200 06/28/24 1300 06/28/24 1400 06/28/24 1413  BP: 114/76 101/62 (!) 59/37 (!) 136/103  Pulse: 83 86 84 78  Resp:      Temp:      TempSrc:      SpO2: 100% 100% 100% 100%  Weight:      Height:         Body mass index is 39.41 kg/m.  Physical Exam   GENERAL: Awake, alert in NAD HEENT: - Normocephalic and atraumatic, dry mm, no LN++, no Thyromegally LUNGS - Clear to auscultation bilaterally with no wheezes CV - S1S2 RRR, no m/r/g, equal pulses bilaterally. ABDOMEN - Soft, nontender, nondistended with normoactive BS NEURO:  Mental Status: AA&Ox3 Speech and Language: speech is clear.  Naming, repetition, fluency, and comprehension intact. Cranial Nerves: PERRL. EOMI, visual fields full, no facial asymmetry, facial sensation intact, hearing intact, tongue/uvula/soft palate midline, normal sternocleidomastoid and trapezius muscle strength. No evidence of tongue atrophy or fibrillations Motor: Symmetric 5/5 all over Tone: is normal and bulk is normal Sensation- Intact to light touch bilaterally Coordination: FTN intact bilaterally, no ataxia in BLE. Gait- deferred   Medications  Current Facility-Administered Medications:    acetaminophen  (TYLENOL ) tablet 650 mg, 650 mg, Oral, Q6H PRN, 650 mg at 06/28/24 1419 **OR** acetaminophen  (TYLENOL ) suppository 650 mg, 650 mg, Rectal, Q6H PRN, Franky Redia SAILOR, MD   amoxicillin -clavulanate (AUGMENTIN ) 875-125 MG per tablet 1 tablet, 1 tablet, Oral, Q12H, Jadine Toribio SQUIBB, MD, 1 tablet at 06/28/24 1419   atorvastatin  (LIPITOR) tablet 10 mg, 10 mg, Oral, Daily, Franky Redia SAILOR, MD, 10 mg at 06/28/24 1011   carvedilol  (COREG ) tablet 6.25 mg, 6.25 mg, Oral, BID, Kakrakandy, Arshad N, MD, 6.25 mg at  06/28/24 1011   Chlorhexidine  Gluconate Cloth 2 % PADS 6 each, 6 each, Topical, Daily, Jadine Toribio SQUIBB, MD, 6 each at 06/27/24 1540   doxycycline  (VIBRAMYCIN ) 100 mg in sodium chloride  0.9 % 250 mL IVPB, 100 mg, Intravenous, Q12H, Franky Redia SAILOR, MD, Stopped at 06/28/24 9370   HYDROcodone -acetaminophen  (NORCO/VICODIN) 5-325 MG per tablet 1 tablet, 1 tablet, Oral, Q6H PRN, Jadine Toribio SQUIBB, MD, 1 tablet at 06/28/24 1011   hydrocortisone  cream 1 %, , Topical, QID, Daniels, James K, NP, Given at 06/28/24 1420   letrozole  (FEMARA ) tablet 2.5 mg, 2.5 mg, Oral, Daily, Franky, Arshad N, MD, 2.5 mg at 06/28/24 1012   lisinopril  (ZESTRIL ) tablet 10 mg, 10 mg, Oral, Daily, Franky, Arshad N, MD, 10 mg at 06/28/24 1011   LORazepam  (ATIVAN ) injection 0.5 mg, 0.5 mg, Intravenous, Once, Rancour, Stephen, MD   methocarbamol  (ROBAXIN ) tablet 500 mg, 500 mg, Oral, Q8H PRN, Jadine Toribio SQUIBB, MD, 500 mg at 06/27/24 1200   Oral care mouth rinse, 15 mL, Mouth Rinse, 4 times per day, Jadine Toribio SQUIBB, MD, 15 mL at 06/28/24 1218   Oral care mouth rinse, 15 mL, Mouth Rinse, PRN, Jadine Toribio SQUIBB, MD   pantoprazole  (PROTONIX ) EC tablet 40 mg, 40 mg, Oral, Daily, Franky Redia SAILOR, MD, 40 mg at 06/28/24 1011  Facility-Administered Medications Ordered in Other Encounters:    gadopentetate dimeglumine  (MAGNEVIST ) injection 20 mL, 20 mL, Intravenous, Once PRN, Ines Onetha NOVAK, MD   gadopentetate dimeglumine  (MAGNEVIST ) injection 20 mL, 20 mL, Intravenous, Once PRN, Ines,  Onetha NOVAK, MD  Labs and Diagnostic Imaging   CBC:  Recent Labs  Lab 06/22/24 0817 06/22/24 0817 06/27/24 0014 06/27/24 0033 06/27/24 0436  WBC 3.1*  --  3.8*  --  4.5  NEUTROABS 1.6*  --   --   --  3.8  HGB 11.1*   < > 10.7* 10.9* 10.2*  HCT 33.1*  --  33.9* 32.0* 31.8*  MCV 91.4  --  95.5  --  96.4  PLT 145*  --  137*  --  107*   < > = values in this interval not displayed.    Basic Metabolic Panel:  Lab Results   Component Value Date   NA 139 06/27/2024   K 3.8 06/27/2024   CO2 22 06/27/2024   GLUCOSE 106 (H) 06/27/2024   BUN 21 06/27/2024   CREATININE 1.20 (H) 06/27/2024   CALCIUM  9.1 06/27/2024   GFRNONAA 51 (L) 06/27/2024   GFRAA >60 06/26/2020   Lipid Panel:  Lab Results  Component Value Date   LDLCALC 53 12/08/2023   HgbA1c:  Lab Results  Component Value Date   HGBA1C 5.7 (H) 05/13/2017   Urine Drug Screen:     Component Value Date/Time   LABOPIA NEGATIVE 06/27/2024 0631   COCAINSCRNUR NEGATIVE 06/27/2024 0631   LABBENZ NEGATIVE 06/27/2024 0631   AMPHETMU NEGATIVE 06/27/2024 0631   THCU NEGATIVE 06/27/2024 0631   LABBARB NEGATIVE 06/27/2024 0631    Alcohol Level     Component Value Date/Time   ETH <15 06/27/2024 0436   INR  Lab Results  Component Value Date   INR 1.2 06/27/2024   APTT  Lab Results  Component Value Date   APTT 29 06/27/2024     MRI Brain(Personally reviewed): Study with and without contrast-no acute infarct or abnormality-no change from July 2018  rEEG:  Normal study.  Assessment   [name] is a 63 y.o. female history of metastatic breast cancer being followed by the oncology team, hypertension, CKD 3 brought to the ER when she became confused and unable to talk and generally weak.  Seen as code stroke x 2 for change in mentation and inability to talk with stuttering without verbalization in terms of her speech. A few hours after the episode, started to come around and is completely back to baseline now There is some concern of a spider bite few days ago for which she was started on antibiotics-doxycycline  and Augmentin . Adverse reaction to insect bite could be a possibility but I do not have a very clear answer on why she had the speech deficits and confusion.   Recommendations  No further workup from an inpatient neurology perspective Routine EEG and MRI were unremarkable I do not see a need for outpatient follow-up  with neurology unless new neurological symptoms happen or any of the prior symptoms recur. Medical management per primary team as you are Plan was discussed with Dr. Jadine Please call with questions as needed ______________________________________________________________________   Signed, Eligio Lav, MD [facility]

## 2024-06-29 DIAGNOSIS — R519 Headache, unspecified: Secondary | ICD-10-CM | POA: Diagnosis not present

## 2024-06-29 DIAGNOSIS — R509 Fever, unspecified: Secondary | ICD-10-CM

## 2024-06-29 DIAGNOSIS — S80861A Insect bite (nonvenomous), right lower leg, initial encounter: Secondary | ICD-10-CM

## 2024-06-29 DIAGNOSIS — D61818 Other pancytopenia: Secondary | ICD-10-CM | POA: Diagnosis not present

## 2024-06-29 DIAGNOSIS — G9340 Encephalopathy, unspecified: Secondary | ICD-10-CM | POA: Diagnosis not present

## 2024-06-29 LAB — CBC
HCT: 33.7 % — ABNORMAL LOW (ref 36.0–46.0)
Hemoglobin: 10.3 g/dL — ABNORMAL LOW (ref 12.0–15.0)
MCH: 29.9 pg (ref 26.0–34.0)
MCHC: 30.6 g/dL (ref 30.0–36.0)
MCV: 98 fL (ref 80.0–100.0)
Platelets: 118 K/uL — ABNORMAL LOW (ref 150–400)
RBC: 3.44 MIL/uL — ABNORMAL LOW (ref 3.87–5.11)
RDW: 12.2 % (ref 11.5–15.5)
WBC: 2.8 K/uL — ABNORMAL LOW (ref 4.0–10.5)
nRBC: 0 % (ref 0.0–0.2)

## 2024-06-29 LAB — BASIC METABOLIC PANEL WITH GFR
Anion gap: 9 (ref 5–15)
BUN: 12 mg/dL (ref 8–23)
CO2: 23 mmol/L (ref 22–32)
Calcium: 9.3 mg/dL (ref 8.9–10.3)
Chloride: 109 mmol/L (ref 98–111)
Creatinine, Ser: 0.98 mg/dL (ref 0.44–1.00)
GFR, Estimated: >60 mL/min (ref >60)
Glucose, Bld: 97 mg/dL (ref 70–99)
Potassium: 4.3 mmol/L (ref 3.5–5.1)
Sodium: 141 mmol/L (ref 135–145)

## 2024-06-29 MED ORDER — DOXYCYCLINE HYCLATE 100 MG PO TABS
100.0000 mg | ORAL_TABLET | Freq: Two times a day (BID) | ORAL | Status: DC
  Administered 2024-06-29 – 2024-07-01 (×4): 100 mg via ORAL
  Filled 2024-06-29 (×5): qty 1

## 2024-06-29 MED ORDER — OXYCODONE HCL 5 MG PO TABS
5.0000 mg | ORAL_TABLET | ORAL | Status: DC | PRN
  Administered 2024-06-29: 10 mg via ORAL
  Administered 2024-06-30: 5 mg via ORAL
  Filled 2024-06-29 (×2): qty 2

## 2024-06-29 NOTE — Plan of Care (Signed)
   Problem: Education: Goal: Knowledge of General Education information will improve Description Including pain rating scale, medication(s)/side effects and non-pharmacologic comfort measures Outcome: Progressing   Problem: Health Behavior/Discharge Planning: Goal: Ability to manage health-related needs will improve Outcome: Progressing

## 2024-06-29 NOTE — Plan of Care (Signed)
  Problem: Education: Goal: Knowledge of General Education information will improve Description: Including pain rating scale, medication(s)/side effects and non-pharmacologic comfort measures Outcome: Progressing   Problem: Nutrition: Goal: Adequate nutrition will be maintained Outcome: Progressing   Problem: Coping: Goal: Level of anxiety will decrease Outcome: Not Progressing   Problem: Elimination: Goal: Will not experience complications related to urinary retention Outcome: Progressing   Problem: Safety: Goal: Ability to remain free from injury will improve Outcome: Progressing

## 2024-06-29 NOTE — Consult Note (Addendum)
 [facility]                                                                                        Patient Identification: Patient Name: [name] MRN: [id number] Admit Date: 06/27/2024 12:19 AM Today's Date: 06/29/2024 Reason for consult: Insect bite, headache ?  Aseptic meningitis Requesting provider: Dr. Judeth  Principal Problem:   Sepsis [facility]) Active Problems:   Essential hypertension   Malignant neoplasm of upper-outer quadrant of left breast in female, estrogen receptor positive (HCC)   HLD (hyperlipidemia)   Acute encephalopathy   CKD (chronic kidney disease) stage 3, GFR 30-59 ml/min (HCC)   Transient alteration of awareness   Cellulitis   Antibiotics:  Vancomycin  9/14-9/15 Metronidazole  9/14 Doxycycline  9/14- Augmentin  9/16- Cefepime  9/14-9/16  Lines/Hardware:  Assessment # Encephalopathy  - Symptoms of suddenly being nonverbal and not following commands *2 with 2 code stroke and unremarkable workup by neurology including CT/MRI and EEG   # Headache  - she seems to have h/o headache from past - Fever is one time during admission and none since then.   - does not seem to fit aseptic meningitis/encephalitis etiology. Unclear if any association with recent ? Spider/mosquito bite ( she did not see, unclear  it was tick bite and tick borne etiology).  [place]. Do not think she needs LP now  # Insect bite ( spider vs mosquito vs others) - Does not seem to have cellulitis on exam of rt thigh/Rt UE - Liver enzymes wnl. Mild leukopenia and thrombocytopenia during admission which appears to be similar to prior values. No signs of hemolytic anemia - Differentials considered lyme disease, ehrlichia, RMSF, Babesia   Recommendations  - complete 7 days of PO doxycycline . DC augmentin    - will get ehrlichia serology  as well as peripheral smear ( check for morulae) - management of headache per primary team - Universal/standard isolation precautions  Communicated to primary team   Rest of the management as per the primary team. Please call with questions or concerns.  Thank you for the consult  __________________________________________________________________________________________________________ HPI and Hospital Course: 63 year old female with prior history of GERD/esophageal stricture, HTN, CKD, metastatic breast cancer followed by Dr. Odean on Abemaciclib  and letrozole  OP, Obesity who was brought into the ED on 9/15 for sudden onset confusion, unable to talk.   She [place] she believes she was bite by a ?  mosquito in her right thigh as well as right upper extremity and developed 2 nodules in the rt thigh. She was seen at the [facility] and was prescribed cephalexin  which she was taking.  She was watching TV with her husband when all of a sudden she stopped speaking and not following commands.  She reports her right side of the body was feeling hot. She was able to get into the car but unable to get out of the car.   No  fevers, chills, chest pain, shortness of breath, cough, nausea vomiting, abdominal pain, /constipation, GU complaints.  No rashes or joint pain.  No new visual change or neck pain or photophobia.     At ED, code stroke activated.  Evaluated by teleneurologist.  Family declined thrombolytics.  Tmax 100.9 tachycardic, hypertensive Labs remarkable for creatinine 1.45, WBC 3.8, hemoglobin 10.7, platelets 137 Influenza A/influenza B/RSV/SARS-CoV-2 negative RMSF and Lyme serology negative MRSA PCR negative  UA not s/o of UTI 9/15 blood cx 2/2 sets NG  CTA Streaky densities within the partially visualized left upper lobe, favored to reflect atelectasis, although infiltrate could also be considered in the correct clinical setting. Correlation with plain film  radiography suggested as warranted.  Was given IVF and antibiotics, trial of Ativan  for concern of seizure, IV labetalol  Patient slowly started getting her strength back and was able to talk and able to move her extremities during her course in the ED but still had pain in the right lower extremity.  Patient had another episode on 9/15 when she suddenly became nonverbal not following commands with mild twitching.  Code stroke called.  CT head negative.  Seen by teleneurology with recommendation for EEG.  EEG negative for seizures.  Encephalopathy resolved the following day  She reports  having headache at the vertex region since Monday which is throbbing.  She reports being up-to-date on her eye prescriptions.  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   ROS: General- Denies fever, chills, loss of appetite and loss of weight HEENT - Denies blurry vision, neck pain, sinus pain Chest - Denies any chest pain, SOB or cough CVS- Denies any dizziness/lightheadedness, syncopal attacks, palpitations Abdomen- Denies any nausea, vomiting, abdominal pain, hematochezia and diarrhea Neuro - Denies any weakness, numbness, tingling sensation Psych - Denies any changes in mood irritability or depressive symptoms GU- Denies any burning, dysuria, hematuria or increased frequency of urination Skin - lesion in rt thigh and RUE MSK - denies any joint pain/swelling or restricted ROM   Past Medical History:  Diagnosis Date   Complication of anesthesia    PONV   Fatigue    GERD (gastroesophageal reflux disease)    w nonobstructing esophageal stricture   Headache    Heat intolerance    History of ETT 03/2009   myoview EF 65%, normal wall motion, no ischemia or infarction, poor exercise capacity   HTN (hypertension)    left breast ca dx'd 10/2018   breast- left   Motion sickness    Normal echocardiogram 03/2009   EF 60-65% moderate diastolic dysfunction, mild LAE   Numbness  and tingling    Obesity    PONV (postoperative nausea and vomiting)    S/P partial hysterectomy    Past Surgical History:  Procedure Laterality Date   ABDOMINAL HYSTERECTOMY  2007   partial    AXILLARY LYMPH NODE DISSECTION Left 03/11/2019   Procedure: LEFT AXILLARY LYMPH NODE DISSECTION;  Surgeon: Vanderbilt Ned, MD;  Location: [facility];  Service: General;  Laterality: Left;   BREAST LUMPECTOMY WITH RADIOACTIVE SEED AND SENTINEL LYMPH NODE BIOPSY Left 02/22/2019   Procedure: LEFT BREAST LUMPECTOMY x2 WITH RADIOACTIVE SEED AND LEFT AXILLA SEED  GUIDED LYMPH NODE BIOPSY AND LEFT SENTINEL LYMPH NODE MAPPING;  Surgeon: Vanderbilt Ned, MD;  Location: [facility];  Service: General;  Laterality: Left;   BRONCHIAL BIOPSY  08/10/2023   Procedure: BRONCHIAL BIOPSIES;  Surgeon: Brenna Adine CROME, DO;  Location: [facility];  Service: Pulmonary;;   CARPAL TUNNEL RELEASE Bilateral 2018   FOOT SURGERY Bilateral 2016   hysterectomy (  other)     Left ear lobe repair Left 05/25/2024   PORT-A-CATH REMOVAL N/A 02/22/2019   Procedure: REMOVAL PORT-A-CATH;  Surgeon: Vanderbilt Ned, MD;  Location: [facility];  Service: General;  Laterality: N/A;   PORTACATH PLACEMENT N/A 11/10/2018   Procedure: INSERTION PORT-A-CATH WITH ULTRASOUND;  Surgeon: Vanderbilt Ned, MD;  Location: [facility];  Service: General;  Laterality: N/A;   TONSILLECTOMY  1968   UPPER GASTROINTESTINAL ENDOSCOPY      Scheduled Meds:  amoxicillin -clavulanate  1 tablet Oral Q12H   atorvastatin   10 mg Oral Daily   carvedilol   6.25 mg Oral BID   Chlorhexidine  Gluconate Cloth  6 each Topical Daily   hydrocortisone  cream   Topical QID   letrozole   2.5 mg Oral Daily   lisinopril   10 mg Oral Daily   mouth rinse  15 mL Mouth Rinse 4 times per day   pantoprazole   40 mg Oral Daily   Continuous Infusions:  doxycycline  (VIBRAMYCIN ) IV Stopped (06/29/24 0725)   PRN Meds:.acetaminophen  **OR**  acetaminophen , methocarbamol , mouth rinse, oxyCODONE   No Known Allergies  Social History   Socioeconomic History   Marital status: Married    Spouse name: Not on file   Number of children: 2   Years of education: Not on file   Highest education level: Not on file  Occupational History   Occupation: Runner, broadcasting/film/video  Tobacco Use   Smoking status: Never    Passive exposure: Never   Smokeless tobacco: Never  Vaping Use   Vaping status: Never Used  Substance and Sexual Activity   Alcohol use: No   Drug use: No   Sexual activity: Yes    Birth control/protection: Surgical  Other Topics Concern   Not on file  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 w/ her family   Right-handed   Caffeine: 1/2 cup coffee during the school year, tea   Social Drivers of Health   Financial Resource Strain: Not on file  Food Insecurity: No Food Insecurity (01/10/2024)   Hunger Vital Sign    Worried About Running Out of Food in the Last Year: Never true    Ran Out of Food in the Last Year: Never true  Transportation Needs: No Transportation Needs (01/10/2024)   PRAPARE - Administrator, Civil Service (Medical): No    Lack of Transportation (Non-Medical): No  Physical Activity: Not on file  Stress: Not on file  Social Connections: Socially Integrated (01/10/2024)   Social Connection and Isolation Panel    Frequency of Communication with Friends and Family: Three times a week    Frequency of Social Gatherings with Friends and Family: Not on file    Attends Religious Services: More than 4 times per year    Active Member of [employer] or Organizations: Yes    Attends Engineer, structural: More than 4 times per year    Marital Status: Married  Catering manager Violence: Not At Risk (01/10/2024)   Humiliation, Afraid, Rape, and Kick questionnaire    Fear of Current or Ex-Partner: No    Emotionally Abused: No    Physically Abused: No    Sexually Abused: No   Family History   Problem Relation Age of Onset   Heart attack Mother 30   Heart disease Mother    Diabetes Sister    Heart attack Maternal Grandmother        age 30 or 32   Heart disease Maternal Grandmother  Neuropathy Neg Hx    Colon cancer Neg Hx    Colon polyps Neg Hx    Esophageal cancer Neg Hx    Stomach cancer Neg Hx    Rectal cancer Neg Hx     Vitals BP (!) 137/91 (BP Location: Right Arm)   Pulse 75   Temp 97.8 F (36.6 C) (Oral)   Resp 15   Ht 6' 1 (1.854 m)   Wt 135.5 kg   SpO2 100%   BMI 39.41 kg/m    Physical Exam Constitutional: Adult female sitting in the bed, not in acute distress    Comments: HEENT WNL  Cardiovascular:     Rate and Rhythm: Normal rate and regular rhythm.     Heart sounds: S1 and S2  Pulmonary:     Effort: Pulmonary effort is normal.     Comments: Normal breath sounds  Abdominal:     Palpations: Abdomen is soft.     Tenderness: Nondistended and nontender  Musculoskeletal:        General: No swelling or tenderness in peripheral joints.   Skin:    Comments: Lesion in the right thigh as well as right upper extremity, does not appear to be cellulitis.  No erythema, warmth or tenderness or fluctuance or crepitus  Neurological:     General: Awake, alert and oriented, grossly nonfocal.  No neck rigidity  Psychiatric:        Mood and Affect: Mood normal.    Pertinent Microbiology Results for orders placed or performed during the hospital encounter of 06/27/24  Blood culture (routine x 2)     Status: None (Preliminary result)   Collection Time: 06/27/24 12:25 AM   Specimen: BLOOD  Result Value Ref Range Status   Specimen Description   Final    BLOOD SITE NOT SPECIFIED Performed at [facility], [address], [place], [place] [address]    Special Requests   Final    BOTTLES DRAWN AEROBIC AND ANAEROBIC Blood Culture adequate volume Performed at [facility], [address], [place], [place]  [address]    Culture   Final    NO GROWTH 2 DAYS Performed at [facility], [address], [place], [place] [address]    Report Status PENDING  Incomplete  Blood culture (routine x 2)     Status: None (Preliminary result)   Collection Time: 06/27/24 12:50 AM   Specimen: BLOOD  Result Value Ref Range Status   Specimen Description   Final    BLOOD SITE NOT SPECIFIED Performed at [facility], [address], [place], [place] [address]    Special Requests   Final    BOTTLES DRAWN AEROBIC AND ANAEROBIC Blood Culture adequate volume Performed at [facility], [address] [place], [place] [address]    Culture   Final    NO GROWTH 2 DAYS Performed at [facility], [address] [place], [place] [address]    Report Status PENDING  Incomplete  Resp panel by RT-PCR (RSV, Flu A&B, Covid) Anterior Nasal Swab     Status: None   Collection Time: 06/27/24  1:33 AM   Specimen: Anterior Nasal Swab  Result Value Ref Range Status   SARS Coronavirus 2 by RT PCR NEGATIVE NEGATIVE Final    Comment: (NOTE) SARS-CoV-2 target nucleic acids are NOT DETECTED.  The SARS-CoV-2 RNA is generally detectable in upper respiratory specimens during the acute phase of infection. The  lowest concentration of SARS-CoV-2 viral copies this assay can detect is 138 copies/mL. A negative result does not preclude SARS-Cov-2 infection and should not be used as the sole basis for treatment or other patient management decisions. A negative result may occur with  improper specimen collection/handling, submission of specimen other than nasopharyngeal swab, presence of viral mutation(s) within the areas targeted by this assay, and inadequate number of viral copies(<138 copies/mL). A negative result must be combined with clinical observations, patient history, and epidemiological information. The expected result is Negative.  Fact Sheet for Patients:   BloggerCourse.com  Fact Sheet for Healthcare Providers:  SeriousBroker.it  This test is no t yet approved or cleared by the [place]  FDA and  has been authorized for detection and/or diagnosis of SARS-CoV-2 by FDA under an Emergency Use Authorization (EUA). This EUA will remain  in effect (meaning this test can be used) for the duration of the COVID-19 declaration under Section 564(b)(1) of the Act, 21 U.S.C.section 360bbb-3(b)(1), unless the authorization is terminated  or revoked sooner.       Influenza A by PCR NEGATIVE NEGATIVE Final   Influenza B by PCR NEGATIVE NEGATIVE Final    Comment: (NOTE) The Xpert Xpress SARS-CoV-2/FLU/RSV plus assay is intended as an aid in the diagnosis of influenza from Nasopharyngeal swab specimens and should not be used as a sole basis for treatment. Nasal washings and aspirates are unacceptable for Xpert Xpress SARS-CoV-2/FLU/RSV testing.  Fact Sheet for Patients: BloggerCourse.com  Fact Sheet for Healthcare Providers: SeriousBroker.it  This test is not yet approved or cleared by the [place]  FDA and has been authorized for detection and/or diagnosis of SARS-CoV-2 by FDA under an Emergency Use Authorization (EUA). This EUA will remain in effect (meaning this test can be used) for the duration of the COVID-19 declaration under Section 564(b)(1) of the Act, 21 U.S.C. section 360bbb-3(b)(1), unless the authorization is terminated or revoked.     Resp Syncytial Virus by PCR NEGATIVE NEGATIVE Final    Comment: (NOTE) Fact Sheet for Patients: BloggerCourse.com  Fact Sheet for Healthcare Providers: SeriousBroker.it  This test is not yet approved or cleared by the [place]  FDA and has been authorized for detection and/or diagnosis of SARS-CoV-2 by FDA under an Emergency Use  Authorization (EUA). This EUA will remain in effect (meaning this test can be used) for the duration of the COVID-19 declaration under Section 564(b)(1) of the Act, 21 U.S.C. section 360bbb-3(b)(1), unless the authorization is terminated or revoked.  Performed at [facility], [address], [place], [place] [address]   MRSA Next Gen by PCR, Nasal     Status: None   Collection Time: 06/27/24  2:34 PM   Specimen: Nasal Mucosa; Nasal Swab  Result Value Ref Range Status   MRSA by PCR Next Gen NOT DETECTED NOT DETECTED Final    Comment: (NOTE) The GeneXpert MRSA Assay (FDA approved for NASAL specimens only), is one component of a comprehensive MRSA colonization surveillance program. It is not intended to diagnose MRSA infection nor to guide or monitor treatment for MRSA infections. Test performance is not FDA approved in patients less than 27 years old. Performed at [facility], [address], [place], [place] [address]    Pertinent Lab seen by me:    Latest Ref Rng & Units 06/29/2024    6:16 AM 06/27/2024    4:36 AM 06/27/2024   12:33 AM  CBC  WBC 4.0 - 10.5 K/uL 2.8  4.5  Hemoglobin 12.0 - 15.0 g/dL 89.6  89.7  89.0   Hematocrit 36.0 - 46.0 % 33.7  31.8  32.0   Platelets 150 - 400 K/uL 118  107        Latest Ref Rng & Units 06/29/2024    6:16 AM 06/27/2024    4:36 AM 06/27/2024   12:33 AM  CMP  Glucose 70 - 99 mg/dL 97  893  877   BUN 8 - 23 mg/dL 12  21  22    Creatinine 0.44 - 1.00 mg/dL 9.01  8.79  8.39   Sodium 135 - 145 mmol/L 141  139  137   Potassium 3.5 - 5.1 mmol/L 4.3  3.8  3.7   Chloride 98 - 111 mmol/L 109  104  102   CO2 22 - 32 mmol/L 23  22    Calcium  8.9 - 10.3 mg/dL 9.3  9.1    Total Protein 6.5 - 8.1 g/dL  6.3    Total Bilirubin 0.0 - 1.2 mg/dL  0.5    Alkaline Phos 38 - 126 U/L  68    AST 15 - 41 U/L  21    ALT 0 - 44 U/L  15      Pertinent Imagings/Other Imagings Plain films and CT images have been  personally visualized and interpreted; radiology reports have been reviewed. Decision making incorporated into the Impression / Recommendations.  EEG adult Result Date: 06/27/2024 Shelton Arlin KIDD, MD     06/27/2024  4:40 PM Patient Name: Adella Manolis MRN: [id number] Epilepsy Attending: Arlin KIDD Shelton Referring Physician/Provider: Germaine Raring, MD Date: 06/27/2024 Duration: 34.23 mins Patient history: 63 y.o. female with hx of metastatic breast cancer being followed by Dr. Odean, oncologist, hypertension, chronic kidney disease stage III was brought to the ER after patient's husband found that patient became suddenly confused and unable to talk and generally weak. EEG to evaluate for seizure Level of alertness: Awake, asleep AEDs during EEG study: Ativan  Technical aspects: This EEG study was done with scalp electrodes positioned according to the 10-20 International system of electrode placement. Electrical activity was reviewed with band pass filter of 1-70Hz , sensitivity of 7 uV/mm, display speed of 15mm/sec with a 60Hz  notched filter applied as appropriate. EEG data were recorded continuously and digitally stored.  Video monitoring was available and reviewed as appropriate. Description: The posterior dominant rhythm consists of 8-9 Hz activity of moderate voltage (25-35 uV) seen predominantly in posterior head regions, symmetric and reactive to eye opening and eye closing. Sleep was characterized by vertex waves, sleep spindles (12 to 14 Hz), maximal frontocentral region.  Physiologic photic driving was not seen during photic stimulation.  Hyperventilation was not performed.   IMPRESSION: This study is within normal limits. No seizures or epileptiform discharges were seen throughout the recording. A normal interictal EEG does not exclude the diagnosis of epilepsy. Arlin KIDD Shelton   CT HEAD CODE STROKE WO CONTRAST Result Date: 06/27/2024 EXAM: CT HEAD WITHOUT CONTRAST 06/27/2024 02:01:08  PM TECHNIQUE: CT of the head was performed without the administration of intravenous contrast. Automated exposure control, iterative reconstruction, and/or weight based adjustment of the mA/kV was utilized to reduce the radiation dose to as low as reasonably achievable. COMPARISON: MRI head 06/27/2024 CLINICAL HISTORY: Neuro deficit, acute, stroke suspected. CODE STROKE; Dr.Reynolds; [phone]; Expressive aphasia; Unable to follow commands FINDINGS: BRAIN AND VENTRICLES: No acute hemorrhage. No evidence of acute infarct. No hydrocephalus. No extra-axial collection. No mass effect or midline shift. [place]  stroke program early CT (aspect) score ----- Ganglionic (caudate, internal capsule, lentiform nucleus, insula, M1-m3): 7 Supraganglionic (m4-m6): 3 Total: 10 ORBITS: No acute abnormality. SINUSES: No acute abnormality. SOFT TISSUES AND SKULL: No acute soft tissue abnormality. No skull fracture. IMPRESSION: 1. No acute intracranial abnormality. 2. ASPECT score: 10. 3. Findings discussed with Dr. Germaine at 2:08PM on 06/27/24. Electronically signed by: Donnice Mania MD 06/27/2024 02:08 PM EDT RP Workstation: [id number]   MR Brain W and Wo Contrast Result Date: 06/27/2024 CLINICAL DATA:  Neuro deficit, acute stroke suspected EXAM: MRI HEAD WITHOUT AND WITH CONTRAST TECHNIQUE: Multiplanar, multiecho pulse sequences of the brain and surrounding structures were obtained without and with intravenous contrast. CONTRAST:  10mL GADAVIST  GADOBUTROL  1 MMOL/ML IV SOLN COMPARISON:  May 06, 2017 FINDINGS: MRI brain: The brain volume is normal. There are several foci of T2 hyperintensity in the cerebral white matter. These do not have restricted diffusion. There is no acute or chronic infarct. The ventricles are normal. No mass lesion. There are normal flow signals in the carotid arteries and basilar artery. No significant bone marrow signal abnormality. No significant abnormality in the paranasal sinuses or soft tissues.  IMPRESSION: No acute infarct or other significant abnormality No change from May 06, 2017 Electronically Signed   By: Nancyann Burns M.D.   On: 06/27/2024 08:02   DG Chest Portable 1 View Result Date: 06/27/2024 CLINICAL DATA:  Altered mental status EXAM: PORTABLE CHEST 1 VIEW COMPARISON:  08/10/2023 FINDINGS: Cardiac shadow is enlarged but stable. The lungs are well aerated bilaterally. No focal infiltrate is seen. Stable scarring in the left mid lung is noted. No bony abnormality is seen. IMPRESSION: No active disease. Electronically Signed   By: Oneil Devonshire M.D.   On: 06/27/2024 02:10   DG Femur Min 2 Views Right Result Date: 06/27/2024 CLINICAL DATA:  Right leg insect bite with pain and swelling, initial encounter EXAM: RIGHT FEMUR 2 VIEWS COMPARISON:  None Available. FINDINGS: No acute fracture or dislocation is noted. Degenerative changes of the knee joint are seen. No radiopaque foreign body is noted. No soft tissue abnormality is seen. IMPRESSION: No acute abnormality noted. Electronically Signed   By: Oneil Devonshire M.D.   On: 06/27/2024 02:10   CT ANGIO HEAD NECK W WO CM Result Date: 06/27/2024 CLINICAL DATA:  Initial evaluation for acute neuro deficit, stroke. EXAM: CT ANGIOGRAPHY HEAD AND NECK WITH AND WITHOUT CONTRAST TECHNIQUE: Multidetector CT imaging of the head and neck was performed using the standard protocol during bolus administration of intravenous contrast. Multiplanar CT image reconstructions and MIPs were obtained to evaluate the vascular anatomy. Carotid stenosis measurements (when applicable) are obtained utilizing NASCET criteria, using the distal internal carotid diameter as the denominator. RADIATION DOSE REDUCTION: This exam was performed according to the departmental dose-optimization program which includes automated exposure control, adjustment of the mA and/or kV according to patient size and/or use of iterative reconstruction technique. CONTRAST:  75mL OMNIPAQUE  IOHEXOL  350  MG/ML SOLN COMPARISON:  CT from earlier the same day. FINDINGS: CTA NECK FINDINGS Aortic arch: Visualized aortic arch within normal limits for caliber. Bovine branching pattern noted. No stenosis about the origin the great vessels. Right carotid system: No evidence of dissection, stenosis (50% or greater), or occlusion. Left carotid system: No evidence of dissection, stenosis (50% or greater), or occlusion. Vertebral arteries: No evidence of dissection, stenosis (50% or greater), or occlusion. Skeleton: No discrete or worrisome osseous lesions. Moderately advanced spondylosis at C5-6 and C6-7. Mild osteoarthritic changes  noted about the TMJs bilaterally. Other neck: No other acute finding. Few small right thyroid  nodules noted, largest of which measures 1.4 cm on the right. These are of doubtful significance given size and patient age, with no follow-up imaging recommended (ref: J Am Coll Radiol. 2015 Feb;12(2): 143-50). Upper chest: Streaky densities noted within the partially visualized left upper lobe, favored to reflect atelectasis, although infiltrate not excluded. Probable sequelae of prior left axillary nodal dissection noted. Review of the MIP images confirms the above findings CTA HEAD FINDINGS Anterior circulation: Atheromatous change about the carotid siphons without hemodynamically significant stenosis. A1 segments, anterior communicating artery complex common anterior cerebral arteries patent without stenosis. No M1 stenosis or occlusion. No proximal MCA branch occlusion. Distal MCA branches perfused and symmetric. Posterior circulation: Both V4 segments patent without stenosis. Left vertebral artery dominant. Both PICA patent. Basilar patent without stenosis. Superior cerebellar and posterior cerebral arteries widely patent bilaterally. Venous sinuses: Patent allowing for timing the contrast bolus. Anatomic variants: None significant.  No aneurysm. Review of the MIP images confirms the above findings  IMPRESSION: 1. Negative CTA for large vessel occlusion or other emergent finding. 2. Mild atheromatous change about the carotid siphons without hemodynamically significant stenosis. 3. Streaky densities within the partially visualized left upper lobe, favored to reflect atelectasis, although infiltrate could also be considered in the correct clinical setting. Correlation with plain film radiography suggested as warranted. Electronically Signed   By: Morene Hoard M.D.   On: 06/27/2024 01:15   CT HEAD CODE STROKE WO CONTRAST Result Date: 06/27/2024 CLINICAL DATA:  Code stroke. Initial evaluation for acute neuro deficit, stroke suspected. EXAM: CT HEAD WITHOUT CONTRAST TECHNIQUE: Contiguous axial images were obtained from the base of the skull through the vertex without intravenous contrast. RADIATION DOSE REDUCTION: This exam was performed according to the departmental dose-optimization program which includes automated exposure control, adjustment of the mA and/or kV according to patient size and/or use of iterative reconstruction technique. COMPARISON:  None Available. FINDINGS: Brain: Cerebral volume within normal limits for patient age. No acute intracranial hemorrhage. No acute large vessel territory infarct. No mass lesion, midline shift, or mass effect. Ventricles are normal in size without hydrocephalus. No extra-axial fluid collection. Vascular: No abnormal hyperdense vessel. Skull: Scalp soft tissues demonstrate no acute abnormality. Calvarium intact. Sinuses/Orbits: Globes and orbital soft tissues within normal limits. Visualized paranasal sinuses are largely clear. No significant mastoid effusion. ASPECTS [facility] Stroke Program Early CT Score) - Ganglionic level infarction (caudate, lentiform nuclei, internal capsule, insula, M1-M3 cortex): 7 - Supraganglionic infarction (M4-M6 cortex): 3 Total score (0-10 with 10 being normal): 10 IMPRESSION: 1. No acute intracranial abnormality. 2. ASPECTS is  10. Results were called by telephone at the time of interpretation on 06/27/2024 at 12:38 am to provider [facility] , who verbally acknowledged these results. Electronically Signed   By: Morene Hoard M.D.   On: 06/27/2024 00:41   CT CHEST ABDOMEN PELVIS W CONTRAST Result Date: 06/21/2024 CLINICAL DATA:  Metastatic breast cancer restaging * Tracking Code: BO * EXAM: CT CHEST, ABDOMEN, AND PELVIS WITH CONTRAST TECHNIQUE: Multidetector CT imaging of the chest, abdomen and pelvis was performed following the standard protocol during bolus administration of intravenous contrast. RADIATION DOSE REDUCTION: This exam was performed according to the departmental dose-optimization program which includes automated exposure control, adjustment of the mA and/or kV according to patient size and/or use of iterative reconstruction technique. CONTRAST:  OMNIPAQUE  IOHEXOL  300 MG/ML  SOLN COMPARISON:  02/23/2024 FINDINGS: CT CHEST  FINDINGS Cardiovascular: No significant vascular findings. Normal heart size. Left coronary artery calcifications no pericardial effusion. Mediastinum/Nodes: No enlarged mediastinal, hilar, or axillary lymph nodes. Small hiatal hernia. Thyroid  gland, trachea, and esophagus demonstrate no significant findings. Lungs/Pleura: Subpleural radiation fibrosis of the anterior left lung. Multiple small bilateral pulmonary nodules, approximately 15, unchanged, largest nodule in the medial right lower lobe measuring 0.7 cm (series 6, image 81). No pleural effusion or pneumothorax. Musculoskeletal: Lumpectomy of the upper outer left breast. Left axillary lymph node dissection. No acute osseous findings. CT ABDOMEN PELVIS FINDINGS Hepatobiliary: No solid liver abnormality is seen. No gallstones, gallbladder wall thickening, or biliary dilatation. Pancreas: Fatty atrophy of the pancreatic head. No pancreatic ductal dilatation or surrounding inflammatory changes. Spleen: Normal in size without significant  abnormality. Adrenals/Urinary Tract: Adrenal glands are unremarkable. Kidneys are normal, without renal calculi, solid lesion, or hydronephrosis. Unchanged diffuse thickening of the urinary bladder (series 5, image 111). Stomach/Bowel: Stomach is within normal limits. Appendix appears normal. No evidence of bowel wall thickening, distention, or inflammatory changes. Vascular/Lymphatic: Aortic atherosclerosis. No enlarged abdominal or pelvic lymph nodes. Reproductive: Hysterectomy. Other: No abdominal wall hernia or abnormality. No ascites. Musculoskeletal: No acute osseous findings. IMPRESSION: 1. Unchanged small bilateral pulmonary nodules. 2. No evidence of lymphadenopathy or metastatic disease in the abdomen or pelvis. 3. Lumpectomy of the upper outer left breast. Left axillary lymph node dissection. 4. Coronary artery disease. Aortic Atherosclerosis (ICD10-I70.0). Electronically Signed   By: Marolyn JONETTA Jaksch M.D.   On: 06/21/2024 14:56   I spent 87 minutes involved in face-to-face and non-face-to-face activities for this patient on the day of the visit. Professional time spent includes the following activities: Preparing to see the patient (review of tests), Obtaining and reviewing separately obtained history (ED note, H&P , hospitalist progress note , neurology note), performing a medically appropriate examination and evaluation Ordering medications/labs, referring and communicating with other health care professionals including ID pharmacy, documenting clinical information in the EMR, Independently interpreting results (not separately reported), Communicating results to the patient, Counseling and educating the patient  and Care coordination (not separately reported).  Electronically signed by:   Plan d/w requesting provider as well as ID pharm D  Of note, portions of this note may have been created with voice recognition software. While this note has been edited for accuracy, occasional wrong-word or  'sound-a-like' substitutions may have occurred due to the inherent limitations of voice recognition software.   Annalee Orem, MD Infectious Disease Physician [facility] Pager: [phone]

## 2024-06-29 NOTE — Progress Notes (Signed)
 PROGRESS NOTE   [name]  FMW:[id number]    DOB: 1960/12/07    DOA: 06/27/2024  PCP: Cristopher Suzen HERO, NP   I have briefly reviewed patients previous medical records in [facility].   Brief Hospital Course:  63 year old married female, retired Psychologist, forensic, physically very active (goes regularly to the gym), PMH including metastatic breast cancer not on chemotherapy presented to the emergency department with report of becoming suddenly nonverbal, not following commands although apparently able to walk to her car. Per EDP exam was not following commands or speaking, protected face from falling arm but no movement against gravity. CT head and CTA neck unrevealing, seen by teleneurology, TNK was offered but husband declined, was treated with Ativan  for possible seizure activity. Apparently some concern for sepsis for unclear reasons. Teleneurology consultation no usable speech, did not follow commands. Also apparently with some kind of insect bite to right thigh several days prior, treated with Keflex  as an outpatient. Exam of admitting physician reportedly alert, awake oriented to time place and person, moving all extremities but generally weak with pain on the right. Admitted for possible sepsis without definite source of infection, consideration given to thigh cellulitis; acute encephalopathy.    Assessment & Plan:   Acute encephalopathy Prior to admission patient with episode of becoming nonverbal, not following commands.  In the emergency department EDP documented the same, was seen by teleneurology and declined intervention.  CT imaging was negative for LVO and acute abnormalities.  By the time patient seen by admitting physician described as being alert and oriented and following commands.  Subsequent MRI brain was negative Had a second episode 9/15, patient was at baseline and then she suddenly again became nonverbal, not following commands, mouth twitching.  Code  stroke was called, CT head was negative, patient seen by teleneurology with recommendation for EEG.  EEG was unremarkable.  Transferred to stepdown unit for close monitoring. Encephalopathy has resolved.  Etiology unclear. Neurology signoff note from 9/16 appreciated.  They recommend no further workup from neurology perspective or need for outpatient neurology follow-up.   SIRS, POA.  Sepsis ruled out. Right thigh cellulitis Possible sepsis described on admission, with low-grade temperature, tachycardia, possible right sided thigh cellulitis.  Possible insect bite. Imaging was ordered of the thigh but was deferred by [facility] MD on 9/16, based on exam there is no evidence of complicating features As per [facility] MD 9/16, thigh looks to have a local reaction, not clearly cellulitis.  Did not meet sepsis criteria on admission. After 2 days of IV antibiotics including IV cefepime  and vancomycin , a dose of IV metronidazole , patient was transition to Augmentin  and doxycycline  on 9/16. Blood cultures negative to date.  Headache Noted since hospital admission.  No clear prior history of migraines.  Not relieved by current pain medication regimen. Etiology not clear.  Given history of insect bite, headache,?  Related/aseptic meningitis.  Will have ID colleagues consult and opine.   Hypertension Stable.  Continue carvedilol  and lisinopril .   Acute kidney injury on CKD stage IIIa Creatinine 1.6 on initial admission.  This has improved to 0.98.  AKI resolved.   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 Odean presently on Verzenio  and letrozole . Outpatient follow-up.   Pancytopenia Thrombocytopenia has been present for several months, will follow clinically.  Stable Hemoglobin stable Leukopenia has also been ongoing for some time.  Stable.    Body mass index is 39.41 kg/m./Class II obesity Complicates care.  Outpatient follow-up.   DVT prophylaxis:      Code Status: Full Code:  Family  Communication:  Disposition:  Status is: Inpatient Remains inpatient appropriate because: Ongoing headaches in the context of recent insect bite, AMS (now resolved), multimodality pain control, ID consultation.  However is stable for transfer to regular medical bed.  Will also request PT since patient states that she feels weak and has not been out of bed since hospital admission.     Consultants:   Neurology Infectious disease  Procedures:   EEG  Subjective:  Seen this morning while still in ICU.  Patient's female RN at bedside.  Patient reports headache on top of her head/vertex, rated at 8/10 in severity, constant since hospital admission, throbbing, nonradiating, no photophobia or phonophobia, some nausea but no vomiting and able to tolerate diet.  Objective:   Vitals:   06/29/24 0400 06/29/24 0600 06/29/24 0642 06/29/24 0800  BP: (!) 177/92 (!) 168/80 (!) 137/91   Pulse: 65 69 75   Resp: 14 13 15    Temp:    97.8 F (36.6 C)  TempSrc:    Oral  SpO2: 100% 100% 100%   Weight:      Height:        General exam: Middle-age female, moderately built and obese sitting up comfortably in bed without distress.  Appears to be in good spirits. Respiratory system: Clear to auscultation. Respiratory effort normal. Cardiovascular system: S1 & S2 heard, RRR. No JVD, murmurs, rubs, gallops or clicks. No pedal edema.  Telemetry personally reviewed: Sinus rhythm. Gastrointestinal system: Abdomen is nondistended, soft and nontender. No organomegaly or masses felt. Normal bowel sounds heard. Central nervous system: Alert and oriented. No focal neurological deficits. Neck: Supple. Extremities: Symmetric 5 x 5 power. Skin: No rashes, lesions or ulcers Psychiatry: Judgement and insight appear normal. Mood & affect appropriate.     Data Reviewed:   I have personally reviewed following labs and imaging studies   CBC: Recent Labs  Lab 06/27/24 0014 06/27/24 0033 06/27/24 0436  06/29/24 0616  WBC 3.8*  --  4.5 2.8*  NEUTROABS  --   --  3.8  --   HGB 10.7* 10.9* 10.2* 10.3*  HCT 33.9* 32.0* 31.8* 33.7*  MCV 95.5  --  96.4 98.0  PLT 137*  --  107* 118*    Basic Metabolic Panel: Recent Labs  Lab 06/27/24 0014 06/27/24 0033 06/27/24 0436 06/29/24 0616  NA 136 137 139 141  K 3.6 3.7 3.8 4.3  CL 100 102 104 109  CO2 24  --  22 23  GLUCOSE 128* 122* 106* 97  BUN 21 22 21 12   CREATININE 1.45* 1.60* 1.20* 0.98  CALCIUM  9.6  --  9.1 9.3  MG  --   --  1.9  --     Liver Function Tests: Recent Labs  Lab 06/27/24 0014 06/27/24 0436  AST 23 21  ALT 19 15  ALKPHOS 81 68  BILITOT 0.6 0.5  PROT 7.3 6.3*  ALBUMIN 4.2 3.7    CBG: Recent Labs  Lab 06/27/24 0014 06/27/24 1337  GLUCAP 118* 117*    Microbiology Studies:   Recent Results (from the past 240 hours)  Blood culture (routine x 2)     Status: None (Preliminary result)   Collection Time: 06/27/24 12:25 AM   Specimen: BLOOD  Result Value Ref Range Status   Specimen Description   Final    BLOOD SITE NOT SPECIFIED Performed at [facility], [address] [place], [place] [address]    Special Requests   Final    BOTTLES DRAWN AEROBIC AND ANAEROBIC Blood Culture adequate volume Performed at [facility], [address], [place], [place] [address]    Culture   Final    NO GROWTH 1 DAY Performed at [facility], [address], [place], [place] [address]    Report Status PENDING  Incomplete  Blood culture (routine x 2)     Status: None (Preliminary result)   Collection Time: 06/27/24 12:50 AM   Specimen: BLOOD  Result Value Ref Range Status   Specimen Description   Final    BLOOD SITE NOT SPECIFIED Performed at [facility], [address], [place], [place] [address]    Special Requests   Final    BOTTLES DRAWN AEROBIC AND ANAEROBIC Blood Culture adequate volume Performed at [facility], [address], [place], [place] [address]    Culture   Final    NO GROWTH 1 DAY Performed at [facility], [address] [place], [place] [address]    Report Status PENDING  Incomplete  Resp panel by RT-PCR (RSV, Flu A&B, Covid) Anterior Nasal Swab     Status: None   Collection Time: 06/27/24  1:33 AM   Specimen: Anterior Nasal Swab  Result Value Ref Range Status   SARS Coronavirus 2 by RT PCR NEGATIVE NEGATIVE Final    Comment: (NOTE) SARS-CoV-2 target nucleic acids are NOT DETECTED.  The SARS-CoV-2 RNA is generally detectable in upper respiratory specimens during the acute phase of infection. The lowest concentration of SARS-CoV-2 viral copies this assay can detect is 138 copies/mL. A negative result does not preclude SARS-Cov-2 infection and should not be used as the sole basis for treatment or other patient management decisions. A negative result may occur with  improper specimen collection/handling, submission of specimen other than nasopharyngeal swab, presence of viral mutation(s) within the areas targeted by this assay, and inadequate number of viral copies(<138 copies/mL). A negative result must be combined with clinical observations, patient history, and epidemiological information. The expected result is Negative.  Fact Sheet for Patients:  BloggerCourse.com  Fact Sheet for Healthcare Providers:  SeriousBroker.it  This test is no t yet approved or cleared by the [place]  FDA and  has been authorized for detection and/or diagnosis of SARS-CoV-2 by FDA under an Emergency Use Authorization (EUA). This EUA will remain  in effect (meaning this test can be used) for the duration of the COVID-19 declaration under Section 564(b)(1) of the Act, 21 U.S.C.section 360bbb-3(b)(1), unless the authorization is terminated  or revoked sooner.       Influenza A by PCR NEGATIVE NEGATIVE Final   Influenza B by PCR NEGATIVE  NEGATIVE Final    Comment: (NOTE) The Xpert Xpress SARS-CoV-2/FLU/RSV plus assay is intended as an aid in the diagnosis of influenza from Nasopharyngeal swab specimens and should not be used as a sole basis for treatment. Nasal washings and aspirates are unacceptable for Xpert Xpress SARS-CoV-2/FLU/RSV testing.  Fact Sheet for Patients: BloggerCourse.com  Fact Sheet for Healthcare Providers: SeriousBroker.it  This test is not yet approved or cleared by the [place]  FDA and has been authorized for detection and/or diagnosis of SARS-CoV-2 by FDA under an Emergency Use Authorization (EUA). This EUA will remain in effect (meaning this test can be used) for the duration of the COVID-19 declaration under Section 564(b)(1) of the Act,  21 U.S.C. section 360bbb-3(b)(1), unless the authorization is terminated or revoked.     Resp Syncytial Virus by PCR NEGATIVE NEGATIVE Final    Comment: (NOTE) Fact Sheet for Patients: BloggerCourse.com  Fact Sheet for Healthcare Providers: SeriousBroker.it  This test is not yet approved or cleared by the [place]  FDA and has been authorized for detection and/or diagnosis of SARS-CoV-2 by FDA under an Emergency Use Authorization (EUA). This EUA will remain in effect (meaning this test can be used) for the duration of the COVID-19 declaration under Section 564(b)(1) of the Act, 21 U.S.C. section 360bbb-3(b)(1), unless the authorization is terminated or revoked.  Performed at [facility], [address] [place], [place] [address]   MRSA Next Gen by PCR, Nasal     Status: None   Collection Time: July 01, 2024  2:34 PM   Specimen: Nasal Mucosa; Nasal Swab  Result Value Ref Range Status   MRSA by PCR Next Gen NOT DETECTED NOT DETECTED Final    Comment: (NOTE) The GeneXpert MRSA Assay (FDA approved for NASAL specimens only), is  one component of a comprehensive MRSA colonization surveillance program. It is not intended to diagnose MRSA infection nor to guide or monitor treatment for MRSA infections. Test performance is not FDA approved in patients less than 54 years old. Performed at [facility], [address], [place], [place] [address]     Radiology Studies:  EEG adult Result Date: Jul 01, 2024 Shelton Arlin KIDD, MD     01-Jul-2024  4:40 PM Patient Name: [name] MRN: [id number] Epilepsy Attending: Arlin KIDD Shelton Referring Physician/Provider: Germaine Raring, MD Date: 07/01/2024 Duration: 34.23 mins Patient history: 63 y.o. female with hx of metastatic breast cancer being followed by Dr. Odean, oncologist, hypertension, chronic kidney disease stage III was brought to the ER after patient's husband found that patient became suddenly confused and unable to talk and generally weak. EEG to evaluate for seizure Level of alertness: Awake, asleep AEDs during EEG study: Ativan  Technical aspects: This EEG study was done with scalp electrodes positioned according to the 10-20 International system of electrode placement. Electrical activity was reviewed with band pass filter of 1-70Hz , sensitivity of 7 uV/mm, display speed of 66mm/sec with a 60Hz  notched filter applied as appropriate. EEG data were recorded continuously and digitally stored.  Video monitoring was available and reviewed as appropriate. Description: The posterior dominant rhythm consists of 8-9 Hz activity of moderate voltage (25-35 uV) seen predominantly in posterior head regions, symmetric and reactive to eye opening and eye closing. Sleep was characterized by vertex waves, sleep spindles (12 to 14 Hz), maximal frontocentral region.  Physiologic photic driving was not seen during photic stimulation.  Hyperventilation was not performed.   IMPRESSION: This study is within normal limits. No seizures or epileptiform discharges were seen  throughout the recording. A normal interictal EEG does not exclude the diagnosis of epilepsy. Arlin KIDD Shelton   CT HEAD CODE STROKE WO CONTRAST Result Date: 07-01-24 EXAM: CT HEAD WITHOUT CONTRAST 07-01-24 02:01:08 PM TECHNIQUE: CT of the head was performed without the administration of intravenous contrast. Automated exposure control, iterative reconstruction, and/or weight based adjustment of the mA/kV was utilized to reduce the radiation dose to as low as reasonably achievable. COMPARISON: MRI head 2024/07/01 CLINICAL HISTORY: Neuro deficit, acute, stroke suspected. CODE STROKE; Dr.Reynolds; [phone]; Expressive aphasia; Unable to follow commands FINDINGS: BRAIN AND VENTRICLES: No acute hemorrhage. No evidence of acute infarct. No hydrocephalus. No extra-axial collection. No mass effect or  midline shift. [place] stroke program early CT (aspect) score ----- Ganglionic (caudate, internal capsule, lentiform nucleus, insula, M1-m3): 7 Supraganglionic (m4-m6): 3 Total: 10 ORBITS: No acute abnormality. SINUSES: No acute abnormality. SOFT TISSUES AND SKULL: No acute soft tissue abnormality. No skull fracture. IMPRESSION: 1. No acute intracranial abnormality. 2. ASPECT score: 10. 3. Findings discussed with Dr. Germaine at 2:08PM on 06/27/24. Electronically signed by: Donnice Mania MD 06/27/2024 02:08 PM EDT RP Workstation: [id number]    Scheduled Meds:    amoxicillin -clavulanate  1 tablet Oral Q12H   atorvastatin   10 mg Oral Daily   carvedilol   6.25 mg Oral BID   Chlorhexidine  Gluconate Cloth  6 each Topical Daily   hydrocortisone  cream   Topical QID   letrozole   2.5 mg Oral Daily   lisinopril   10 mg Oral Daily   LORazepam   0.5 mg Intravenous Once   mouth rinse  15 mL Mouth Rinse 4 times per day   pantoprazole   40 mg Oral Daily    Continuous Infusions:    doxycycline  (VIBRAMYCIN ) IV Stopped (06/29/24 0725)     LOS: 2 days     Trenda Mar, MD,  FACP, [facility], [facility], [facility]    [facility] [place]      To contact the attending provider between 7A-7P or the covering provider during after hours 7P-7A, please log into the web site [email or web address] and access using universal [place] password for that web site. If you do not have the password, please call the hospital operator.  06/29/2024, 8:52 AM

## 2024-06-30 ENCOUNTER — Inpatient Hospital Stay (HOSPITAL_COMMUNITY)

## 2024-06-30 ENCOUNTER — Other Ambulatory Visit: Payer: Self-pay

## 2024-06-30 DIAGNOSIS — K5901 Slow transit constipation: Secondary | ICD-10-CM | POA: Diagnosis not present

## 2024-06-30 DIAGNOSIS — G9340 Encephalopathy, unspecified: Secondary | ICD-10-CM | POA: Diagnosis not present

## 2024-06-30 DIAGNOSIS — Z170 Estrogen receptor positive status [ER+]: Secondary | ICD-10-CM | POA: Diagnosis not present

## 2024-06-30 DIAGNOSIS — R519 Headache, unspecified: Secondary | ICD-10-CM | POA: Diagnosis not present

## 2024-06-30 DIAGNOSIS — C50412 Malignant neoplasm of upper-outer quadrant of left female breast: Secondary | ICD-10-CM | POA: Diagnosis not present

## 2024-06-30 LAB — CBC WITH DIFFERENTIAL/PLATELET
Abs Immature Granulocytes: 0.01 K/uL (ref 0.00–0.07)
Basophils Absolute: 0 K/uL (ref 0.0–0.1)
Basophils Relative: 1 %
Eosinophils Absolute: 0.1 K/uL (ref 0.0–0.5)
Eosinophils Relative: 2 %
HCT: 34.4 % — ABNORMAL LOW (ref 36.0–46.0)
Hemoglobin: 11.3 g/dL — ABNORMAL LOW (ref 12.0–15.0)
Immature Granulocytes: 0 %
Lymphocytes Relative: 28 %
Lymphs Abs: 0.9 K/uL (ref 0.7–4.0)
MCH: 31 pg (ref 26.0–34.0)
MCHC: 32.8 g/dL (ref 30.0–36.0)
MCV: 94.5 fL (ref 80.0–100.0)
Monocytes Absolute: 0.3 K/uL (ref 0.1–1.0)
Monocytes Relative: 10 %
Neutro Abs: 1.9 K/uL (ref 1.7–7.7)
Neutrophils Relative %: 59 %
Platelets: 152 K/uL (ref 150–400)
RBC: 3.64 MIL/uL — ABNORMAL LOW (ref 3.87–5.11)
RDW: 11.9 % (ref 11.5–15.5)
WBC: 3.2 K/uL — ABNORMAL LOW (ref 4.0–10.5)
nRBC: 0 % (ref 0.0–0.2)

## 2024-06-30 LAB — TECHNOLOGIST SMEAR REVIEW

## 2024-06-30 MED ORDER — KETOROLAC TROMETHAMINE 15 MG/ML IJ SOLN
15.0000 mg | Freq: Once | INTRAMUSCULAR | Status: AC
  Administered 2024-06-30: 15 mg via INTRAVENOUS
  Filled 2024-06-30: qty 1

## 2024-06-30 MED ORDER — BISACODYL 10 MG RE SUPP
10.0000 mg | Freq: Once | RECTAL | Status: AC
Start: 2024-06-30 — End: 2024-06-30
  Administered 2024-06-30: 10 mg via RECTAL
  Filled 2024-06-30: qty 1

## 2024-06-30 MED ORDER — POLYETHYLENE GLYCOL 3350 17 G PO PACK
17.0000 g | PACK | Freq: Two times a day (BID) | ORAL | Status: DC
  Administered 2024-06-30 – 2024-07-01 (×2): 17 g via ORAL
  Filled 2024-06-30 (×2): qty 1

## 2024-06-30 MED ORDER — PROCHLORPERAZINE EDISYLATE 10 MG/2ML IJ SOLN
10.0000 mg | Freq: Once | INTRAMUSCULAR | Status: AC
  Administered 2024-06-30: 10 mg via INTRAVENOUS
  Filled 2024-06-30: qty 2

## 2024-06-30 MED ORDER — ENOXAPARIN SODIUM 80 MG/0.8ML IJ SOSY
0.5000 mg/kg | PREFILLED_SYRINGE | INTRAMUSCULAR | Status: DC
  Administered 2024-06-30: 67.5 mg via SUBCUTANEOUS
  Filled 2024-06-30: qty 0.8

## 2024-06-30 MED ORDER — SENNOSIDES-DOCUSATE SODIUM 8.6-50 MG PO TABS
2.0000 | ORAL_TABLET | Freq: Every day | ORAL | Status: DC
  Administered 2024-06-30: 2 via ORAL
  Filled 2024-06-30: qty 2

## 2024-06-30 MED ORDER — DIPHENHYDRAMINE HCL 50 MG/ML IJ SOLN
25.0000 mg | Freq: Once | INTRAMUSCULAR | Status: AC
  Administered 2024-06-30: 25 mg via INTRAVENOUS
  Filled 2024-06-30: qty 1

## 2024-06-30 MED ORDER — HYDRALAZINE HCL 20 MG/ML IJ SOLN
10.0000 mg | Freq: Four times a day (QID) | INTRAMUSCULAR | Status: DC | PRN
Start: 2024-06-30 — End: 2024-07-01

## 2024-06-30 NOTE — Progress Notes (Signed)
 PT Cancellation Note  Patient Details Name: [name] MRN: [id number] DOB: 1960-11-26   Cancelled Treatment:    Reason Eval/Treat Not Completed: Fatigue/lethargy limiting ability to participate;Other (comment). RN reports she just assisted pt back to bed, pt reporting pain in arm at IV site, recently received pain medication and benadryl  and falling asleep. Will continue to follow for PT eval.   Metta Ave PT, DPT 06/30/24, 12:45 PM

## 2024-06-30 NOTE — Progress Notes (Signed)
 PROGRESS NOTE   [name]  FMW:[id number]    DOB: [birth year]    DOA: 06/27/2024  PCP: Cristopher Suzen HERO, NP   I have briefly reviewed patients previous medical records in [facility].   Brief Hospital Course:  63 year old married female, retired Psychologist, forensic, physically very active (goes regularly to the gym), PMH including metastatic breast cancer not on chemotherapy presented to the emergency department with report of becoming suddenly nonverbal, not following commands although apparently able to walk to her car. Per EDP exam was not following commands or speaking, protected face from falling arm but no movement against gravity. CT head and CTA neck unrevealing, seen by teleneurology, TNK was offered but husband declined, was treated with Ativan  for possible seizure activity. Apparently some concern for sepsis for unclear reasons. Teleneurology consultation no usable speech, did not follow commands. Also apparently with some kind of insect bite to right thigh several days prior, treated with Keflex  as an outpatient. Exam of admitting physician reportedly alert, awake oriented to time place and person, moving all extremities but generally weak with pain on the right. Admitted for possible sepsis without definite source of infection, consideration given to thigh cellulitis; acute encephalopathy.    Assessment & Plan:   Acute encephalopathy Prior to admission patient with episode of becoming nonverbal, not following commands.  In the emergency department EDP documented the same, was seen by teleneurology and declined intervention.  CT imaging was negative for LVO and acute abnormalities.  By the time patient seen by admitting physician described as being alert and oriented and following commands.  Subsequent MRI brain was negative Had a second episode 9/15, patient was at baseline and then she suddenly again became nonverbal, not following commands, mouth twitching.  Code  stroke was called, CT head was negative, patient seen by teleneurology with recommendation for EEG.  EEG was unremarkable.  Transferred to stepdown unit for close monitoring. Encephalopathy has resolved.  Etiology unclear. Neurology signoff note from 9/16 appreciated.  They recommend no further workup from neurology perspective or need for outpatient neurology follow-up.   SIRS, POA.  Sepsis ruled out. Right thigh cellulitis Possible sepsis described on admission, with low-grade temperature, tachycardia, possible right sided thigh cellulitis.  Possible insect bite. Imaging was ordered of the thigh but was deferred by [facility] MD on 9/16, based on exam there is no evidence of complicating features As per [facility] MD 9/16, thigh looks to have a local reaction, not clearly cellulitis.  Did not meet sepsis criteria on admission. After 2 days of IV antibiotics including IV cefepime  and vancomycin , a dose of IV metronidazole , patient was transition to Augmentin  and doxycycline  on 9/16. Blood cultures negative to date. As per ID consultation 9/17, exam of her right thigh/right upper extremity did not seem cellulitis, and as per their recommendations, stopped Augmentin .  Cellulitis resolved.  Insect bite ID consultation 9/17 appreciated.  They indicated that her clinical picture did not fit aseptic meningitis or encephalitis etiology. There were also not sure if there was any association with recent insect bite and her headache.  No LP was recommended.  They sent off Ehrlichia  titers and recommended 7 days of oral doxycycline . Advised patient that she should take doxycycline  with plenty of fluids, food and try to be upright for the 1 to 2 hours after the medication.  Headache Noted since hospital admission.  No clear prior history of migraines.  Not relieved by current pain medication regimen. Etiology not clear.  See ID input above Given ongoing headache, communicated with neurologist who last saw her on 9/16  and he recommended trial of migraine cocktail x 1 dose including IV Toradol  15 mg, IV Benadryl  25 mg and IV Compazine  10 mg   Hypertension Mildly uncontrolled at times.  Continue carvedilol  and lisinopril . Patient complaining of some dizziness on attempting to get up this morning without feeling like passing out.  Check orthostatic vital signs.   Acute kidney injury on CKD stage IIIa Creatinine 1.6 on initial admission.  This has improved to 0.98.  AKI resolved.   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 Odean presently on Verzenio  and letrozole . Outpatient follow-up.   Pancytopenia Thrombocytopenia has been present for several months, will follow clinically.  Stable Hemoglobin stable Leukopenia has also been ongoing for some time.  Stable. Patient declined CBCs earlier but agreeable to have it done now.   Nausea and vomiting Unclear etiology.?  Related to the headache versus doxycycline . Continue PPI.  At patient's request, changed diet from dysphagia 3 to regular diet. As needed IV antiemetics.  Constipation Trial of Dulcolax suppository.  Added MiraLAX  daily.  Body mass index is 39.41 kg/m./Class II obesity Complicates care.  Outpatient follow-up.   DVT prophylaxis:   Lovenox  added   Code Status: Full Code:  Family Communication: None at bedside Disposition: DC home pending improvement of her symptoms including nausea, vomiting, constipation, dizziness, headache, consistent toleration of diet and stability of CBCs, hopefully tomorrow.      Consultants:   Neurology Infectious disease  Procedures:   EEG  Subjective:  States that she had 2 episodes of nonbloody emesis this morning.  Ongoing headache just the same as yesterday.  Had brief relief with oxycodone  yesterday for 6 hours and then headache returned, improved after Tylenol  overnight but then back to 8/10 since this morning.  No photophobia or phonophobia.  No BM for 3 to 4 days and is asking for meds.   Some dizziness on attempting to stand up today.  Objective:   Vitals:   06/30/24 0552 06/30/24 0936 06/30/24 0940 06/30/24 0943  BP: (!) 122/96 (!) 138/91 (!) 146/101 (!) 147/104  Pulse: 93 95 95 (!) 117  Resp:      Temp:      TempSrc:      SpO2:      Weight:      Height:        General exam: Middle-age female, moderately built and obese sitting up comfortably in bed without distress.  Appears to be in good spirits.  Oral mucosa moist. Respiratory system: Clear to auscultation. Respiratory effort normal. Cardiovascular system: S1 & S2 heard, RRR. No JVD, murmurs, rubs, gallops or clicks. No pedal edema.  Off telemetry. Gastrointestinal system: Abdomen is nondistended, soft and nontender. No organomegaly or masses felt. Normal bowel sounds heard. Central nervous system: Alert and oriented. No focal neurological deficits.  Stable without change. Neck: Supple. Extremities: Symmetric 5 x 5 power. Skin: No rashes, lesions or ulcers Psychiatry: Judgement and insight appear normal. Mood & affect appropriate.     Data Reviewed:   I have personally reviewed following labs and imaging studies   CBC: Recent Labs  Lab 06/27/24 0014 06/27/24 0033 06/27/24 0436 06/29/24 0616  WBC 3.8*  --  4.5 2.8*  NEUTROABS  --   --  3.8  --   HGB 10.7* 10.9* 10.2* 10.3*  HCT 33.9* 32.0* 31.8* 33.7*  MCV 95.5  --  96.4 98.0  PLT 137*  --  107* 118*    Basic Metabolic Panel: Recent Labs  Lab 06/27/24 0014 06/27/24 0033 06/27/24 0436 06/29/24 0616  NA 136 137 139 141  K 3.6 3.7 3.8 4.3  CL 100 102 104 109  CO2 24  --  22 23  GLUCOSE 128* 122* 106* 97  BUN 21 22 21 12   CREATININE 1.45* 1.60* 1.20* 0.98  CALCIUM  9.6  --  9.1 9.3  MG  --   --  1.9  --     Liver Function Tests: Recent Labs  Lab 06/27/24 0014 06/27/24 0436  AST 23 21  ALT 19 15  ALKPHOS 81 68  BILITOT 0.6 0.5  PROT 7.3 6.3*  ALBUMIN 4.2 3.7    CBG: Recent Labs  Lab 06/27/24 0014 06/27/24 1337  GLUCAP  118* 117*    Microbiology Studies:   Recent Results (from the past 240 hours)  Blood culture (routine x 2)     Status: None (Preliminary result)   Collection Time: 06/27/24 12:25 AM   Specimen: BLOOD  Result Value Ref Range Status   Specimen Description   Final    BLOOD SITE NOT SPECIFIED Performed at [facility], [address], [place], [place] [address]    Special Requests   Final    BOTTLES DRAWN AEROBIC AND ANAEROBIC Blood Culture adequate volume Performed at [facility], [address], [place], [place] [address]    Culture   Final    NO GROWTH 3 DAYS Performed at [facility], [address] [place], [place] [address]    Report Status PENDING  Incomplete  Blood culture (routine x 2)     Status: None (Preliminary result)   Collection Time: 06/27/24 12:50 AM   Specimen: BLOOD  Result Value Ref Range Status   Specimen Description   Final    BLOOD SITE NOT SPECIFIED Performed at [facility], [address] [place], [place] [address]    Special Requests   Final    BOTTLES DRAWN AEROBIC AND ANAEROBIC Blood Culture adequate volume Performed at [facility], [address] [place], [place] [address]    Culture   Final    NO GROWTH 3 DAYS Performed at [facility], [address] [place], [place] [address]    Report Status PENDING  Incomplete  Resp panel by RT-PCR (RSV, Flu A&B, Covid) Anterior Nasal Swab     Status: None   Collection Time: 06/27/24  1:33 AM   Specimen: Anterior Nasal Swab  Result Value Ref Range Status   SARS Coronavirus 2 by RT PCR NEGATIVE NEGATIVE Final    Comment: (NOTE) SARS-CoV-2 target nucleic acids are NOT DETECTED.  The SARS-CoV-2 RNA is generally detectable in upper respiratory specimens during the acute phase of infection. The lowest concentration of SARS-CoV-2 viral copies this assay can detect is 138 copies/mL. A negative result does not  preclude SARS-Cov-2 infection and should not be used as the sole basis for treatment or other patient management decisions. A negative result may occur with  improper specimen collection/handling, submission of specimen other than nasopharyngeal swab, presence of viral mutation(s) within the areas targeted by this assay, and inadequate number of viral copies(<138 copies/mL). A negative result must be combined with clinical observations, patient history, and epidemiological information. The expected result is Negative.  Fact Sheet for Patients:  BloggerCourse.com  Fact Sheet for Healthcare Providers:  SeriousBroker.it  This test is  no t yet approved or cleared by the [place]  FDA and  has been authorized for detection and/or diagnosis of SARS-CoV-2 by FDA under an Emergency Use Authorization (EUA). This EUA will remain  in effect (meaning this test can be used) for the duration of the COVID-19 declaration under Section 564(b)(1) of the Act, 21 U.S.C.section 360bbb-3(b)(1), unless the authorization is terminated  or revoked sooner.       Influenza A by PCR NEGATIVE NEGATIVE Final   Influenza B by PCR NEGATIVE NEGATIVE Final    Comment: (NOTE) The Xpert Xpress SARS-CoV-2/FLU/RSV plus assay is intended as an aid in the diagnosis of influenza from Nasopharyngeal swab specimens and should not be used as a sole basis for treatment. Nasal washings and aspirates are unacceptable for Xpert Xpress SARS-CoV-2/FLU/RSV testing.  Fact Sheet for Patients: BloggerCourse.com  Fact Sheet for Healthcare Providers: SeriousBroker.it  This test is not yet approved or cleared by the [place]  FDA and has been authorized for detection and/or diagnosis of SARS-CoV-2 by FDA under an Emergency Use Authorization (EUA). This EUA will remain in effect (meaning this test can be used) for the  duration of the COVID-19 declaration under Section 564(b)(1) of the Act, 21 U.S.C. section 360bbb-3(b)(1), unless the authorization is terminated or revoked.     Resp Syncytial Virus by PCR NEGATIVE NEGATIVE Final    Comment: (NOTE) Fact Sheet for Patients: BloggerCourse.com  Fact Sheet for Healthcare Providers: SeriousBroker.it  This test is not yet approved or cleared by the [place]  FDA and has been authorized for detection and/or diagnosis of SARS-CoV-2 by FDA under an Emergency Use Authorization (EUA). This EUA will remain in effect (meaning this test can be used) for the duration of the COVID-19 declaration under Section 564(b)(1) of the Act, 21 U.S.C. section 360bbb-3(b)(1), unless the authorization is terminated or revoked.  Performed at [facility], [address] [place], [place] [address]   MRSA Next Gen by PCR, Nasal     Status: None   Collection Time: 06/27/24  2:34 PM   Specimen: Nasal Mucosa; Nasal Swab  Result Value Ref Range Status   MRSA by PCR Next Gen NOT DETECTED NOT DETECTED Final    Comment: (NOTE) The GeneXpert MRSA Assay (FDA approved for NASAL specimens only), is one component of a comprehensive MRSA colonization surveillance program. It is not intended to diagnose MRSA infection nor to guide or monitor treatment for MRSA infections. Test performance is not FDA approved in patients less than 75 years old. Performed at [facility], [address], [place], [place] [address]     Radiology Studies:  No results found.   Scheduled Meds:    atorvastatin   10 mg Oral Daily   carvedilol   6.25 mg Oral BID   Chlorhexidine  Gluconate Cloth  6 each Topical Daily   doxycycline   100 mg Oral Q12H   hydrocortisone  cream   Topical QID   letrozole   2.5 mg Oral Daily   lisinopril   10 mg Oral Daily   mouth rinse  15 mL Mouth Rinse 4 times per day   pantoprazole   40  mg Oral Daily    Continuous Infusions:       LOS: 3 days     Trenda Mar, MD,  FACP, [facility], [facility], [facility]   [facility] Advisor [place]      To contact the attending provider between 7A-7P or the covering provider during after hours 7P-7A, please log into the web  site [email or web address] and access using universal [place] password for that web site. If you do not have the password, please call the hospital operator.  06/30/2024, 10:26 AM

## 2024-06-30 NOTE — TOC Progression Note (Signed)
 Transition of Care [facility]) - Progression Note    Patient Details  Name: [name] MRN: [id number] Date of Birth: [birth year]  Transition [facility]) CM/SW Contact  Doneta Glenys DASEN, RN Phone Number: 06/30/2024, 12:11 PM  Clinical Narrative:     CM reviewed notes. No needs identified. Patient should discharge tomorrow 9/19 on po antibiotics.    Expected Discharge Plan: Home/Self Care Barriers to Discharge: Continued Medical Work up               Expected Discharge Plan and Services In-house Referral: Clinical Social Work     Living arrangements for the past 2 months: Single Family Home                 DME Arranged: N/A DME Agency: NA                   Social Drivers of Health (SDOH) Interventions SDOH Screenings   Food Insecurity: No Food Insecurity (01/10/2024)  Housing: Low Risk  (01/10/2024)  Transportation Needs: No Transportation Needs (01/10/2024)  Utilities: Not At Risk (01/10/2024)  Social Connections: Socially Integrated (01/10/2024)  Tobacco Use: Low Risk  (06/27/2024)    Readmission Risk Interventions     No data to display

## 2024-06-30 NOTE — Plan of Care (Signed)
  Problem: Clinical Measurements: Goal: Ability to maintain clinical measurements within normal limits will improve Outcome: Progressing   Problem: Activity: Goal: Risk for activity intolerance will decrease Outcome: Progressing   Problem: Coping: Goal: Level of anxiety will decrease Outcome: Progressing   Problem: Safety: Goal: Ability to remain free from injury will improve Outcome: Progressing   Problem: Skin Integrity: Goal: Risk for impaired skin integrity will decrease Outcome: Progressing   

## 2024-06-30 NOTE — Progress Notes (Signed)
 HEMATOLOGY-ONCOLOGY PROGRESS NOTE  SUBJECTIVE: Complains of headaches, nausea and vomiting and feeling dizzy   Oncology History  Malignant neoplasm of upper-outer quadrant of left breast in female, estrogen receptor positive (HCC)  10/22/2018 Initial Diagnosis   Screening detected left breast architectural distortion: 4.5 cm, UOQ, by ultrasound measured 5 cm at 1 o'clock position multiple enlarged lymph nodes, biopsy revealed grade 1 IDC with DCIS with lymphovascular invasion, lymph node biopsy positive, intramammary lymph node biopsy negative, ER 100%, PR 100%, Ki-67 20%, HER-2 1+ by IHC, T2 and 1 stage 2A   10/27/2018 Cancer Staging   Staging form: Breast, AJCC 8th Edition - Clinical: Stage IIA (cT2, cN1(f), cM0, G1, ER+, PR+, HER2-) - Signed by Odean Potts, MD on 11/01/2018   11/11/2018 - 12/20/2018 Neo-Adjuvant Chemotherapy   Neoadjuvant chemotherapy with dose dense Adriamycin  and Cytoxan  every 2 weeks x4 followed by Taxol  weekly x12 (stopped after cycle 3 due to severe neuropathy)   02/01/2019 Breast MRI   Known malignancy in left breast measures 4.7 x 3.5 x 7.2cm, previously 5 x 3 x 10cm.    02/22/2019 Surgery   Left lumpectomy ([facility]): IDC with DCIS, 5.8cm, HER2 negative, ER 90%, PR 90%, Ki67 2%, clear margins,3/5 LN positive for malignancy.    03/02/2019 Cancer Staging   Staging form: Breast, AJCC 8th Edition - Pathologic stage from 03/02/2019: No Stage Recommended (ypT3, pN1a, cM0, G2, ER+, PR+, HER2-) - Signed by Odean Potts, MD on 03/02/2019   03/11/2019 Surgery   Left axillary lymph node dissection ([facility]): metastatic carcinoma in 3/14 lymph nodes, focal extranodal involvement by tumor.   04/20/2019 - 06/02/2019 Radiation Therapy   Adjuvant radiation therapy   05/30/2019 -  Anti-estrogen oral therapy   Anastrozole  1 mg daily     OBJECTIVE: REVIEW OF SYSTEMS:     As above  PHYSICAL EXAMINATION: ECOG PERFORMANCE STATUS: 2 - Symptomatic, <50% confined to bed  Vitals:    06/30/24 0940 06/30/24 0943  BP: (!) 146/101 (!) 147/104  Pulse: 95 (!) 117  Resp:    Temp:    SpO2:     Filed Weights   06/27/24 0525  Weight: 298 lb 11.6 oz (135.5 kg)    GENERAL:alert, no distress and comfortable   NEURO: alert & oriented x 3 with fluent speech, no focal motor/sensory deficits  LABORATORY DATA:  I have reviewed the data as listed    Latest Ref Rng & Units 06/29/2024    6:16 AM 06/27/2024    4:36 AM 06/27/2024   12:33 AM  CMP  Glucose 70 - 99 mg/dL 97  893  877   BUN 8 - 23 mg/dL 12  21  22    Creatinine 0.44 - 1.00 mg/dL 9.01  8.79  8.39   Sodium 135 - 145 mmol/L 141  139  137   Potassium 3.5 - 5.1 mmol/L 4.3  3.8  3.7   Chloride 98 - 111 mmol/L 109  104  102   CO2 22 - 32 mmol/L 23  22    Calcium  8.9 - 10.3 mg/dL 9.3  9.1    Total Protein 6.5 - 8.1 g/dL  6.3    Total Bilirubin 0.0 - 1.2 mg/dL  0.5    Alkaline Phos 38 - 126 U/L  68    AST 15 - 41 U/L  21    ALT 0 - 44 U/L  15      Lab Results  Component Value Date   WBC 3.2 (L) 06/30/2024   HGB  11.3 (L) 06/30/2024   HCT 34.4 (L) 06/30/2024   MCV 94.5 06/30/2024   PLT 152 06/30/2024   NEUTROABS 1.9 06/30/2024    ASSESSMENT AND PLAN: 1.  Nausea vomiting headache and dizziness: I recommended that we obtain a lumbar puncture for ruling out leptomeningeal carcinomatosis.  I placed an order for interventional radiology to do the procedure and the specimen will needs to be sent for cell count, cytology, glucose and protein  2.  Lack of IV access: I discussed with the patient that we could place a PICC line so that she can receive blood draws and injections.  I called her husband to discuss this and left a voicemail for him to call me back

## 2024-07-01 ENCOUNTER — Other Ambulatory Visit (HOSPITAL_COMMUNITY): Payer: Self-pay

## 2024-07-01 ENCOUNTER — Other Ambulatory Visit: Payer: Self-pay | Admitting: *Deleted

## 2024-07-01 DIAGNOSIS — R519 Headache, unspecified: Secondary | ICD-10-CM | POA: Diagnosis not present

## 2024-07-01 DIAGNOSIS — G9340 Encephalopathy, unspecified: Secondary | ICD-10-CM | POA: Diagnosis not present

## 2024-07-01 DIAGNOSIS — S70361D Insect bite (nonvenomous), right thigh, subsequent encounter: Secondary | ICD-10-CM

## 2024-07-01 DIAGNOSIS — Z170 Estrogen receptor positive status [ER+]: Secondary | ICD-10-CM

## 2024-07-01 DIAGNOSIS — W57XXXD Bitten or stung by nonvenomous insect and other nonvenomous arthropods, subsequent encounter: Secondary | ICD-10-CM

## 2024-07-01 MED ORDER — DOXYCYCLINE HYCLATE 100 MG PO TABS
100.0000 mg | ORAL_TABLET | Freq: Two times a day (BID) | ORAL | 0 refills | Status: DC
  Filled 2024-07-01: qty 10, 5d supply, fill #0

## 2024-07-01 MED ORDER — BISACODYL 10 MG RE SUPP
10.0000 mg | Freq: Once | RECTAL | Status: AC
  Administered 2024-07-01: 10 mg via RECTAL
  Filled 2024-07-01: qty 1

## 2024-07-01 MED ORDER — DICLOFENAC SODIUM 1 % EX GEL: 4.0000 g | Freq: Two times a day (BID) | CUTANEOUS | Status: AC | PRN

## 2024-07-01 NOTE — Progress Notes (Addendum)
 [facility] Infectious Diseases Follow Up Note  Patient Identification: Patient Name: [name] MRN: [id number] Admit Date: 06/27/2024 12:19 AM Age: 63 y.o.Today's Date: 07/01/2024  Reason for Visit: headache, bug bite  Principal Problem:   Sepsis (HCC) Active Problems:   Essential hypertension   Malignant neoplasm of upper-outer quadrant of left breast in female, estrogen receptor positive (HCC)   HLD (hyperlipidemia)   Acute encephalopathy   CKD (chronic kidney disease) stage 3, GFR 30-59 ml/min (HCC)   Transient alteration of awareness   Cellulitis   Nonintractable headache   Fever   Insect bite of right leg  Antibiotics:  Doxycycline  9/17- Total days of antibiotics started 6   Lines/Hardware:  Interval Events: Remains afebrile. No labs today  Seen by Oncology, and plan was made for LP to rule out leptomeningeal carcinomatosis  Assessment 63 year old female with prior history of GERD/esophageal stricture, HTN, CKD, metastatic breast cancer followed by Dr. Odean on Abemaciclib  and letrozole  OP, Obesity who was brought into the ED on 9/15 for sudden onset confusion, unable to talk, seems to resolved followed by similar recurrence on 9/15. [place] was bite by a ?  mosquito/spider in her right thigh as well as right upper extremity. She was seen at the [facility] and was prescribed cephalexin .     # Encephalopathy, resolved  - Symptoms of suddenly being nonverbal and not following commands *2 with 2 code stroke and unremarkable workup by neurology including CT/MRI and EEG    # Headache  - Fever is one time during admission and none since then.   - Does not seem to fit bacterial/viral meningitis/encephalitis etiology. Unclear if any association with recent ? spider/mosquito bite (she did not see, unclear  it was tick bite and tick borne etiology).  [place]. - 9/19 seen by Oncology, plan for LP  to r/o leptomeningeal carcinomatosis - Resolved    # Insect bite ( spider vs mosquito vs others) - Does not seem to have cellulitis on exam of rt thigh/Rt UE - Liver enzymes wnl. Mild leukopenia and thrombocytopenia during admission which appears to be similar to prior values. No signs of hemolytic anemia - Differentials considered lyme disease, ehrlichia, RMSF, Babesia  - 9/15 Lyme disease serology and spotted fever group serology negative.   Recommendations - Complete 7 days of PO doxycycline . No need for LP from ID standpoint.  - Fu Ehrlichia serology, pending - Universal/standard isolation precautions  - ID will so, recall back with questions or concerns  Rest of the management as per the primary team. Thank you for the consult. Please page with pertinent questions or concerns. ____________________________________________________________________ Subjective patient seen and examined at the bedside. Headache resolved. No complaints.   Vitals BP 136/80   Pulse 87   Temp 98.1 F (36.7 C)   Resp 14   Ht 6' 1 (1.854 m)   Wt 135.5 kg   SpO2 100%   BMI 39.41 kg/m     Physical Exam Constitutional:  adult female, not in acute distress    Comments: HEENT wnl   Cardiovascular:     Rate and Rhythm: Normal rate and regular rhythm.     Heart sounds:  Pulmonary:     Effort: Pulmonary effort is normal.     Comments:   Abdominal:     Palpations: Abdomen is non distended    Tenderness:   Musculoskeletal:  General: No swelling or tenderness in peripheral joints.   Skin:    Comments: bite lesion in the rt thigh and Rt UE  Neurological:     General: awake, alert and oriented, grossly non focal   Psychiatric:        Mood and Affect: Mood normal.   Pertinent Microbiology Results for orders placed or performed during the hospital encounter of 06/27/24  Blood culture (routine x 2)      Status: None (Preliminary result)   Collection Time: 06/27/24 12:25 AM   Specimen: BLOOD  Result Value Ref Range Status   Specimen Description   Final    BLOOD SITE NOT SPECIFIED Performed at [facility], [address], [place], [place] [address]    Special Requests   Final    BOTTLES DRAWN AEROBIC AND ANAEROBIC Blood Culture adequate volume Performed at [facility], [address], [place], [place] [address]    Culture   Final    NO GROWTH 4 DAYS Performed at [facility], [address], [place], [place] [address]    Report Status PENDING  Incomplete  Blood culture (routine x 2)     Status: None (Preliminary result)   Collection Time: 06/27/24 12:50 AM   Specimen: BLOOD  Result Value Ref Range Status   Specimen Description   Final    BLOOD SITE NOT SPECIFIED Performed at [facility], [address], [place], [place] [address]    Special Requests   Final    BOTTLES DRAWN AEROBIC AND ANAEROBIC Blood Culture adequate volume Performed at [facility], [address] [place], [place] [address]    Culture   Final    NO GROWTH 4 DAYS Performed at [facility], [address] [place], [place] [address]    Report Status PENDING  Incomplete  Resp panel by RT-PCR (RSV, Flu A&B, Covid) Anterior Nasal Swab     Status: None   Collection Time: 06/27/24  1:33 AM   Specimen: Anterior Nasal Swab  Result Value Ref Range Status   SARS Coronavirus 2 by RT PCR NEGATIVE NEGATIVE Final    Comment: (NOTE) SARS-CoV-2 target nucleic acids are NOT DETECTED.  The SARS-CoV-2 RNA is generally detectable in upper respiratory specimens during the acute phase of infection. The lowest concentration of SARS-CoV-2 viral copies this assay can detect is 138 copies/mL. A negative result does not preclude SARS-Cov-2 infection and should not be used as the sole basis for treatment or other patient management  decisions. A negative result may occur with  improper specimen collection/handling, submission of specimen other than nasopharyngeal swab, presence of viral mutation(s) within the areas targeted by this assay, and inadequate number of viral copies(<138 copies/mL). A negative result must be combined with clinical observations, patient history, and epidemiological information. The expected result is Negative.  Fact Sheet for Patients:  BloggerCourse.com  Fact Sheet for Healthcare Providers:  SeriousBroker.it  This test is no t yet approved or cleared by the [place]  FDA and  has been authorized for detection and/or diagnosis of SARS-CoV-2 by FDA under an Emergency Use Authorization (EUA). This EUA will remain  in effect (meaning this test can be used) for the duration of the COVID-19 declaration under Section 564(b)(1) of the Act, 21 U.S.C.section 360bbb-3(b)(1), unless the authorization is terminated  or revoked sooner.       Influenza A by PCR NEGATIVE NEGATIVE Final   Influenza B by PCR NEGATIVE  NEGATIVE Final    Comment: (NOTE) The Xpert Xpress SARS-CoV-2/FLU/RSV plus assay is intended as an aid in the diagnosis of influenza from Nasopharyngeal swab specimens and should not be used as a sole basis for treatment. Nasal washings and aspirates are unacceptable for Xpert Xpress SARS-CoV-2/FLU/RSV testing.  Fact Sheet for Patients: BloggerCourse.com  Fact Sheet for Healthcare Providers: SeriousBroker.it  This test is not yet approved or cleared by the [place]  FDA and has been authorized for detection and/or diagnosis of SARS-CoV-2 by FDA under an Emergency Use Authorization (EUA). This EUA will remain in effect (meaning this test can be used) for the duration of the COVID-19 declaration under Section 564(b)(1) of the Act, 21 U.S.C. section 360bbb-3(b)(1), unless the  authorization is terminated or revoked.     Resp Syncytial Virus by PCR NEGATIVE NEGATIVE Final    Comment: (NOTE) Fact Sheet for Patients: BloggerCourse.com  Fact Sheet for Healthcare Providers: SeriousBroker.it  This test is not yet approved or cleared by the [place]  FDA and has been authorized for detection and/or diagnosis of SARS-CoV-2 by FDA under an Emergency Use Authorization (EUA). This EUA will remain in effect (meaning this test can be used) for the duration of the COVID-19 declaration under Section 564(b)(1) of the Act, 21 U.S.C. section 360bbb-3(b)(1), unless the authorization is terminated or revoked.  Performed at [facility], [address], [place], [place] [address]   MRSA Next Gen by PCR, Nasal     Status: None   Collection Time: 06/27/24  2:34 PM   Specimen: Nasal Mucosa; Nasal Swab  Result Value Ref Range Status   MRSA by PCR Next Gen NOT DETECTED NOT DETECTED Final    Comment: (NOTE) The GeneXpert MRSA Assay (FDA approved for NASAL specimens only), is one component of a comprehensive MRSA colonization surveillance program. It is not intended to diagnose MRSA infection nor to guide or monitor treatment for MRSA infections. Test performance is not FDA approved in patients less than 5 years old. Performed at [facility], [address], [place], [place] [address]    Pertinent Lab.    Latest Ref Rng & Units 06/30/2024   12:17 PM 06/29/2024    6:16 AM 06/27/2024    4:36 AM  CBC  WBC 4.0 - 10.5 K/uL 3.2  2.8  4.5   Hemoglobin 12.0 - 15.0 g/dL 88.6  89.6  89.7   Hematocrit 36.0 - 46.0 % 34.4  33.7  31.8   Platelets 150 - 400 K/uL 152  118  107       Latest Ref Rng & Units 06/29/2024    6:16 AM 06/27/2024    4:36 AM 06/27/2024   12:33 AM  CMP  Glucose 70 - 99 mg/dL 97  893  877   BUN 8 - 23 mg/dL 12  21  22    Creatinine 0.44 - 1.00 mg/dL 9.01  8.79  8.39    Sodium 135 - 145 mmol/L 141  139  137   Potassium 3.5 - 5.1 mmol/L 4.3  3.8  3.7   Chloride 98 - 111 mmol/L 109  104  102   CO2 22 - 32 mmol/L 23  22    Calcium  8.9 - 10.3 mg/dL 9.3  9.1    Total Protein 6.5 - 8.1 g/dL  6.3    Total Bilirubin 0.0 - 1.2 mg/dL  0.5    Alkaline Phos 38 - 126 U/L  68    AST 15 - 41 U/L  21    ALT 0 - 44 U/L  15     Pertinent Imaging today Plain films and CT images have been personally visualized and interpreted; radiology reports have been reviewed. Decision making incorporated into the Impression.  US  EKG SITE RITE Result Date: 06/30/2024 If Site Rite image not attached, placement could not be confirmed due to current cardiac rhythm.  I spent 50 minutes involved in face-to-face and non-face-to-face activities for this patient on the day of the visit. Professional time spent includes the following activities: Preparing to see the patient (review of tests), Obtaining and reviewing separately obtained history (Hospitalist progress note, Oncology note), Performing a medically appropriate examination and evaluation, Ordering medications/labs, referring and communicating with other health care professionals, Documenting clinical information in the EMR, Independently interpreting results (not separately reported), Communicating results to the patient, Counseling and educating the patient and Care coordination (not separately reported).   Plan d/w requesting provider as well as ID pharm D  Of note, portions of this note may have been created with voice recognition software. While this note has been edited for accuracy, occasional wrong-word or 'sound-a-like' substitutions may have occurred due to the inherent limitations of voice recognition software.   Electronically signed by:   Annalee Orem, MD Infectious Disease Physician [facility] Pager: [phone]

## 2024-07-01 NOTE — Progress Notes (Signed)
 Discharge orders received for this patient. The patient is inquiring about her medical records. She would like a copy of her records and a doctors note. Doctors note received from MD. Medical records form received from medical records department. Educated on how to complete form. Literature given on how to submit medical records form request. The patient verbalized understanding.

## 2024-07-01 NOTE — TOC Transition Note (Signed)
 Transition of Care [facility]) - Discharge Note   Patient Details  Name: [name] MRN: [id number] Date of Birth: 01-Apr-1961  [facility] CM/SW Contact:  Doneta Glenys DASEN, RN Phone Number: 07/01/2024, 12:18 PM   Clinical Narrative:    Patient discharged to IP CM needs.Place consult if needs present.   Final next level of care: Home/Self Care Barriers to Discharge: Barriers Resolved   Patient Goals and CMS Choice            Discharge Placement                       Discharge Plan and Services Additional resources added to the After Visit Summary for   In-house Referral: Clinical Social Work              DME Arranged: N/A DME Agency: NA                  Social Drivers of Health (SDOH) Interventions SDOH Screenings   Food Insecurity: No Food Insecurity (01/10/2024)  Housing: Low Risk  (01/10/2024)  Transportation Needs: No Transportation Needs (01/10/2024)  Utilities: Not At Risk (01/10/2024)  Social Connections: Socially Integrated (01/10/2024)  Tobacco Use: Low Risk  (06/27/2024)     Readmission Risk Interventions     No data to display

## 2024-07-01 NOTE — Discharge Instructions (Signed)

## 2024-07-01 NOTE — Progress Notes (Signed)
 PT Cancellation Note  Patient Details Name: [name] MRN: [id number] DOB: 30-May-1961   Cancelled Treatment:    Reason Eval/Treat Not Completed: Other (comment). Pt politely declines PT eval. Pt reports ambulated with nursing yesterday without difficulty, reports feeling back to baseline. Educated pt on purpose of PT evaluation and pt again politely declines. Will sign off, please re-consult if needs arise.   Tori Quinteria Chisum PT, DPT 07/01/24, 9:09 AM

## 2024-07-01 NOTE — Plan of Care (Signed)

## 2024-07-01 NOTE — Discharge Summary (Signed)
 Physician Discharge Summary  [name] FMW:[id number] DOB: May 01, 1961  PCP: Cristopher Suzen HERO, NP  Admitted from: Home Discharged to: Home  Admit date: 06/27/2024 Discharge date: 07/01/2024  Recommendations for Outpatient Follow-up:    Follow-up Information     Cristopher Suzen HERO, NP. Schedule an appointment as soon as possible for a visit in 1 week(s).   Contact information: [address] [place] [address] [phone]         Odean Potts, MD. Schedule an appointment as soon as possible for a visit in 1 week(s).   Specialty: Hematology and Oncology Why: To be seen with repeat labs (CBC & BMP). Contact information: [address] [place] [address] [phone]                  Home Health: None    Equipment/Devices: None    Discharge Condition: Improved and stable.   Code Status: Full Code Diet recommendation:  Discharge Diet Orders (From admission, onward)     Start     Ordered   07/01/24 0000  Diet - low sodium heart healthy        07/01/24 1212             Discharge Diagnoses:  Principal Problem:   Sepsis (HCC) Active Problems:   Malignant neoplasm of upper-outer quadrant of left breast in female, estrogen receptor positive (HCC)   Essential hypertension   HLD (hyperlipidemia)   Acute encephalopathy   CKD (chronic kidney disease) stage 3, GFR 30-59 ml/min (HCC)   Transient alteration of awareness   Cellulitis   Nonintractable headache   Fever   Insect bite of right leg   Brief Summary: Brief Hospital Course:  63 year old married female, retired Psychologist, forensic, physically very active (goes regularly to the gym), PMH including metastatic breast cancer not on chemotherapy presented to the emergency department with report of becoming suddenly nonverbal, not following commands although apparently able to walk to her car. Per EDP exam was not following commands or speaking, protected face  from falling arm but no movement against gravity. CT head and CTA neck unrevealing, seen by teleneurology, TNK was offered but husband declined, was treated with Ativan  for possible seizure activity. Apparently some concern for sepsis for unclear reasons. Teleneurology consultation no usable speech, did not follow commands. Also apparently with some kind of insect bite to right thigh several days prior, treated with Keflex  as an outpatient. Exam of admitting physician reportedly alert, awake oriented to time place and person, moving all extremities but generally weak with pain on the right. Admitted for possible sepsis without definite source of infection, consideration given to thigh cellulitis; acute encephalopathy.  Clinically improved.     Assessment & Plan:    Acute encephalopathy Prior to admission patient with episode of becoming nonverbal, not following commands.  In the emergency department EDP documented the same, was seen by teleneurology and declined intervention.  CT imaging was negative for LVO and acute abnormalities.  By the time patient seen by admitting physician described as being alert and oriented and following commands.  Subsequent MRI brain was negative Had a second episode 9/15, patient was at baseline and then she suddenly again became nonverbal, not following commands, mouth twitching.  Code stroke was called, CT head was negative, patient seen by teleneurology with recommendation for EEG.  EEG was unremarkable.  Transferred to stepdown unit for close monitoring. Encephalopathy has resolved.  Etiology unclear. Neurology signoff note from 9/16 appreciated.  They recommend no further workup from neurology perspective or need for outpatient neurology follow-up.   SIRS, POA.  Sepsis ruled out. Right thigh cellulitis Possible sepsis described on admission, with low-grade temperature, tachycardia, possible right sided thigh cellulitis.  Possible insect bite. Imaging was ordered of  the thigh but was deferred by [facility] MD on 9/16, based on exam there was no evidence of complicating features As per [facility] MD 9/16, thigh looked to have a local reaction, not clearly cellulitis.  Did not meet sepsis criteria on admission. After 2 days of IV antibiotics including IV cefepime  and vancomycin , a dose of IV metronidazole , patient was transitioned to Augmentin  and then eventually by ID to doxycycline  on 9/16. Blood cultures negative to date. As per ID consultation 9/17, exam of her right thigh/right upper extremity did not seem cellulitis, and as per their recommendations, stopped Augmentin .  Cellulitis resolved.   Insect bite ID consultation 9/17 appreciated.  They indicated that her clinical picture did not fit aseptic meningitis or encephalitis etiology. There were also not sure if there was any association with recent insect bite and her headache.  No LP was recommended.  They sent off Ehrlichia  titers (pending at time of discharge and these can be followed up by PCP or oncology during office visit) and recommended 7 days of oral doxycycline . Advised patient that she should take doxycycline  with plenty of fluids, food and try to be upright for the 1 to 2 hours after the medication. Has tolerated doxycycline  and will complete additional 5 days at discharge to complete 7 days course.   Headache Noted since hospital admission.  No clear prior history of migraines.  Not relieved by current pain medication regimen. Etiology not clear.  See ID input above Given ongoing headache, communicated with neurologist who last saw her on 9/16 and he recommended trial of migraine cocktail x 1 dose including IV Toradol  15 mg, IV Benadryl  25 mg and IV Compazine  10 mg.  Headache resolved without recurrence since the migraine cocktail. Oncology was concerned about possibility of leptomeningeal carcinomatosis, LP was ordered but patient declined.  This can be followed up and pursued as deemed necessary at the  cancer center during office visit.   Hypertension Mildly uncontrolled at times.  Resume prior home dose of Zestoretic  and continue carvedilol .  Outpatient follow-up.   Acute kidney injury on CKD stage IIIa Creatinine 1.6 on initial admission.  This has improved to 0.98.  AKI resolved.   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 Odean presently on Verzenio  and letrozole . Outpatient follow-up.  Communicated with Dr. Gudena on day of discharge and he will arrange follow-up.   Pancytopenia Thrombocytopenia has been present for several months, will follow clinically.  Appears to have resolved on labs 9/18.  Patient declined lab work on day of discharge. Hemoglobin stable Leukopenia has also been ongoing for some time.  Stable.  Outpatient follow-up with oncology.   Nausea and vomiting Unclear etiology.?  Related to the headache versus doxycycline . Resolved.  Has tolerated diet without further nausea or vomiting for more than 24 hours.   Constipation Trial of Dulcolax suppository.  Added MiraLAX  daily. Had a big BM since yesterday.  She states that she does have MiraLAX  at home that she can use as needed.   Body mass index is 39.41 kg/m./Class II obesity Complicates care.  Outpatient follow-up.     Consultants:   Neurology Infectious disease Medical oncology   Procedures:   EEG   Discharge  Instructions  Discharge Instructions     Call MD for:   Complete by: As directed    Recurrent episodes of alteration in mental status, strokelike symptoms or seizure-like activity.   Call MD for:  difficulty breathing, headache or visual disturbances   Complete by: As directed    Call MD for:  extreme fatigue   Complete by: As directed    Call MD for:  persistant dizziness or light-headedness   Complete by: As directed    Call MD for:  persistant nausea and vomiting   Complete by: As directed    Call MD for:  severe uncontrolled pain   Complete by: As directed    Call MD  for:  temperature >100.4   Complete by: As directed    Diet - low sodium heart healthy   Complete by: As directed    Increase activity slowly   Complete by: As directed         Medication List     STOP taking these medications    cephALEXin  500 MG capsule Commonly known as: KEFLEX        TAKE these medications    atorvastatin  10 MG tablet Commonly known as: LIPITOR Take 1 tablet by mouth daily.   carvedilol  6.25 MG tablet Commonly known as: COREG  TAKE 1 TABLET BY MOUTH TWICE DAILY   diclofenac  Sodium 1 % Gel Commonly known as: VOLTAREN  Apply 4 g topically 2 (two) times daily as needed (arthritis pain).   diphenoxylate -atropine  2.5-0.025 MG tablet Commonly known as: LOMOTIL  Take 1 tablet by mouth 4 (four) times daily as needed for diarrhea or loose stools.   doxycycline  100 MG tablet Commonly known as: VIBRA -TABS Take 1 tablet (100 mg total) by mouth 2 (two) times daily for 5 days.   letrozole  2.5 MG tablet Commonly known as: FEMARA  Take 1 tablet (2.5 mg total) by mouth daily.   lisinopril -hydrochlorothiazide  10-12.5 MG tablet Commonly known as: ZESTORETIC  Take 1 tablet by mouth daily.   naproxen  500 MG tablet Commonly known as: NAPROSYN  Take 500 mg by mouth daily as needed for moderate pain (pain score 4-6).   pantoprazole  40 MG tablet Commonly known as: PROTONIX  TAKE 1 TABLET BY MOUTH EVERY DAY   Verzenio  100 MG tablet Generic drug: abemaciclib  Take 1 tablet (100 mg total) by mouth 2 (two) times daily.       No Known Allergies    Procedures/Studies: US  EKG SITE RITE Result Date: 06/30/2024 If Site Rite image not attached, placement could not be confirmed due to current cardiac rhythm.  EEG adult Result Date: 06/27/2024 Shelton Arlin KIDD, MD     06/27/2024  4:40 PM Patient Name: [name] MRN: [id number] Epilepsy Attending: Arlin KIDD Shelton Referring Physician/Provider: Germaine Raring, MD Date: 06/27/2024 Duration: 34.23 mins  Patient history: 63 y.o. female with hx of metastatic breast cancer being followed by Dr. Odean, oncologist, hypertension, chronic kidney disease stage III was brought to the ER after patient's husband found that patient became suddenly confused and unable to talk and generally weak. EEG to evaluate for seizure Level of alertness: Awake, asleep AEDs during EEG study: Ativan  Technical aspects: This EEG study was done with scalp electrodes positioned according to the 10-20 International system of electrode placement. Electrical activity was reviewed with band pass filter of 1-70Hz , sensitivity of 7 uV/mm, display speed of 85mm/sec with a 60Hz  notched filter applied as appropriate. EEG data were recorded continuously and digitally stored.  Video monitoring was available and reviewed as  appropriate. Description: The posterior dominant rhythm consists of 8-9 Hz activity of moderate voltage (25-35 uV) seen predominantly in posterior head regions, symmetric and reactive to eye opening and eye closing. Sleep was characterized by vertex waves, sleep spindles (12 to 14 Hz), maximal frontocentral region.  Physiologic photic driving was not seen during photic stimulation.  Hyperventilation was not performed.   IMPRESSION: This study is within normal limits. No seizures or epileptiform discharges were seen throughout the recording. A normal interictal EEG does not exclude the diagnosis of epilepsy. Arlin MALVA Krebs   CT HEAD CODE STROKE WO CONTRAST Result Date: 06/27/2024 EXAM: CT HEAD WITHOUT CONTRAST 06/27/2024 02:01:08 PM TECHNIQUE: CT of the head was performed without the administration of intravenous contrast. Automated exposure control, iterative reconstruction, and/or weight based adjustment of the mA/kV was utilized to reduce the radiation dose to as low as reasonably achievable. COMPARISON: MRI head 06/27/2024 CLINICAL HISTORY: Neuro deficit, acute, stroke suspected. CODE STROKE; Dr.Reynolds; [phone];  Expressive aphasia; Unable to follow commands FINDINGS: BRAIN AND VENTRICLES: No acute hemorrhage. No evidence of acute infarct. No hydrocephalus. No extra-axial collection. No mass effect or midline shift. [place] stroke program early CT (aspect) score ----- Ganglionic (caudate, internal capsule, lentiform nucleus, insula, M1-m3): 7 Supraganglionic (m4-m6): 3 Total: 10 ORBITS: No acute abnormality. SINUSES: No acute abnormality. SOFT TISSUES AND SKULL: No acute soft tissue abnormality. No skull fracture. IMPRESSION: 1. No acute intracranial abnormality. 2. ASPECT score: 10. 3. Findings discussed with Dr. Germaine at 2:08PM on 06/27/24. Electronically signed by: Donnice Mania MD 06/27/2024 02:08 PM EDT RP Workstation: [id number]   MR Brain W and Wo Contrast Result Date: 06/27/2024 CLINICAL DATA:  Neuro deficit, acute stroke suspected EXAM: MRI HEAD WITHOUT AND WITH CONTRAST TECHNIQUE: Multiplanar, multiecho pulse sequences of the brain and surrounding structures were obtained without and with intravenous contrast. CONTRAST:  10mL GADAVIST  GADOBUTROL  1 MMOL/ML IV SOLN COMPARISON:  May 06, 2017 FINDINGS: MRI brain: The brain volume is normal. There are several foci of T2 hyperintensity in the cerebral white matter. These do not have restricted diffusion. There is no acute or chronic infarct. The ventricles are normal. No mass lesion. There are normal flow signals in the carotid arteries and basilar artery. No significant bone marrow signal abnormality. No significant abnormality in the paranasal sinuses or soft tissues. IMPRESSION: No acute infarct or other significant abnormality No change from May 06, 2017 Electronically Signed   By: Nancyann Burns M.D.   On: 06/27/2024 08:02   DG Chest Portable 1 View Result Date: 06/27/2024 CLINICAL DATA:  Altered mental status EXAM: PORTABLE CHEST 1 VIEW COMPARISON:  08/10/2023 FINDINGS: Cardiac shadow is enlarged but stable. The lungs are well aerated bilaterally. No focal  infiltrate is seen. Stable scarring in the left mid lung is noted. No bony abnormality is seen. IMPRESSION: No active disease. Electronically Signed   By: Oneil Devonshire M.D.   On: 06/27/2024 02:10   DG Femur Min 2 Views Right Result Date: 06/27/2024 CLINICAL DATA:  Right leg insect bite with pain and swelling, initial encounter EXAM: RIGHT FEMUR 2 VIEWS COMPARISON:  None Available. FINDINGS: No acute fracture or dislocation is noted. Degenerative changes of the knee joint are seen. No radiopaque foreign body is noted. No soft tissue abnormality is seen. IMPRESSION: No acute abnormality noted. Electronically Signed   By: Oneil Devonshire M.D.   On: 06/27/2024 02:10   CT ANGIO HEAD NECK W WO CM Result Date: 06/27/2024 CLINICAL DATA:  Initial evaluation for acute neuro  deficit, stroke. EXAM: CT ANGIOGRAPHY HEAD AND NECK WITH AND WITHOUT CONTRAST TECHNIQUE: Multidetector CT imaging of the head and neck was performed using the standard protocol during bolus administration of intravenous contrast. Multiplanar CT image reconstructions and MIPs were obtained to evaluate the vascular anatomy. Carotid stenosis measurements (when applicable) are obtained utilizing NASCET criteria, using the distal internal carotid diameter as the denominator. RADIATION DOSE REDUCTION: This exam was performed according to the departmental dose-optimization program which includes automated exposure control, adjustment of the mA and/or kV according to patient size and/or use of iterative reconstruction technique. CONTRAST:  75mL OMNIPAQUE  IOHEXOL  350 MG/ML SOLN COMPARISON:  CT from earlier the same day. FINDINGS: CTA NECK FINDINGS Aortic arch: Visualized aortic arch within normal limits for caliber. Bovine branching pattern noted. No stenosis about the origin the great vessels. Right carotid system: No evidence of dissection, stenosis (50% or greater), or occlusion. Left carotid system: No evidence of dissection, stenosis (50% or greater), or  occlusion. Vertebral arteries: No evidence of dissection, stenosis (50% or greater), or occlusion. Skeleton: No discrete or worrisome osseous lesions. Moderately advanced spondylosis at C5-6 and C6-7. Mild osteoarthritic changes noted about the TMJs bilaterally. Other neck: No other acute finding. Few small right thyroid  nodules noted, largest of which measures 1.4 cm on the right. These are of doubtful significance given size and patient age, with no follow-up imaging recommended (ref: J Am Coll Radiol. 2015 Feb;12(2): 143-50). Upper chest: Streaky densities noted within the partially visualized left upper lobe, favored to reflect atelectasis, although infiltrate not excluded. Probable sequelae of prior left axillary nodal dissection noted. Review of the MIP images confirms the above findings CTA HEAD FINDINGS Anterior circulation: Atheromatous change about the carotid siphons without hemodynamically significant stenosis. A1 segments, anterior communicating artery complex common anterior cerebral arteries patent without stenosis. No M1 stenosis or occlusion. No proximal MCA branch occlusion. Distal MCA branches perfused and symmetric. Posterior circulation: Both V4 segments patent without stenosis. Left vertebral artery dominant. Both PICA patent. Basilar patent without stenosis. Superior cerebellar and posterior cerebral arteries widely patent bilaterally. Venous sinuses: Patent allowing for timing the contrast bolus. Anatomic variants: None significant.  No aneurysm. Review of the MIP images confirms the above findings IMPRESSION: 1. Negative CTA for large vessel occlusion or other emergent finding. 2. Mild atheromatous change about the carotid siphons without hemodynamically significant stenosis. 3. Streaky densities within the partially visualized left upper lobe, favored to reflect atelectasis, although infiltrate could also be considered in the correct clinical setting. Correlation with plain film radiography  suggested as warranted. Electronically Signed   By: Morene Hoard M.D.   On: 06/27/2024 01:15   CT HEAD CODE STROKE WO CONTRAST Result Date: 06/27/2024 CLINICAL DATA:  Code stroke. Initial evaluation for acute neuro deficit, stroke suspected. EXAM: CT HEAD WITHOUT CONTRAST TECHNIQUE: Contiguous axial images were obtained from the base of the skull through the vertex without intravenous contrast. RADIATION DOSE REDUCTION: This exam was performed according to the departmental dose-optimization program which includes automated exposure control, adjustment of the mA and/or kV according to patient size and/or use of iterative reconstruction technique. COMPARISON:  None Available. FINDINGS: Brain: Cerebral volume within normal limits for patient age. No acute intracranial hemorrhage. No acute large vessel territory infarct. No mass lesion, midline shift, or mass effect. Ventricles are normal in size without hydrocephalus. No extra-axial fluid collection. Vascular: No abnormal hyperdense vessel. Skull: Scalp soft tissues demonstrate no acute abnormality. Calvarium intact. Sinuses/Orbits: Globes and orbital soft tissues within normal  limits. Visualized paranasal sinuses are largely clear. No significant mastoid effusion. ASPECTS [facility] Stroke Program Early CT Score) - Ganglionic level infarction (caudate, lentiform nuclei, internal capsule, insula, M1-M3 cortex): 7 - Supraganglionic infarction (M4-M6 cortex): 3 Total score (0-10 with 10 being normal): 10 IMPRESSION: 1. No acute intracranial abnormality. 2. ASPECTS is 10. Results were called by telephone at the time of interpretation on 06/27/2024 at 12:38 am to provider [facility] , who verbally acknowledged these results. Electronically Signed   By: Morene Hoard M.D.   On: 06/27/2024 00:41    Subjective: Denies complaints.  States that she feels fine.  No further nausea or vomiting since yesterday morning.  Has tolerated diet.  Had a large BM  since yesterday.  No further headaches since yesterday morning.  Anxious to discharge home.  Discharge Exam:  Vitals:   06/30/24 2003 07/01/24 0506 07/01/24 0917 07/01/24 1147  BP: (!) 138/96 136/80 (!) 137/95 134/81  Pulse: 92 87 89 91  Resp: 14  18 18   Temp:  98.1 F (36.7 C) 98 F (36.7 C) 98.1 F (36.7 C)  TempSrc:      SpO2: 96% 100% 100% 100%  Weight:      Height:        General exam: Middle-age female, moderately built and obese sitting up comfortably in bed without distress.  Appears to be in great spirits.  Oral mucosa moist. Respiratory system: Clear to auscultation. Respiratory effort normal. Cardiovascular system: S1 & S2 heard, RRR. No JVD, murmurs, rubs, gallops or clicks. No pedal edema.  Off telemetry. Gastrointestinal system: Abdomen is nondistended, soft and nontender. No organomegaly or masses felt. Normal bowel sounds heard. Central nervous system: Alert and oriented. No focal neurological deficits.  Stable without change. Neck: Supple. Extremities: Symmetric 5 x 5 power. Skin: No rashes, lesions or ulcers Psychiatry: Judgement and insight appear normal. Mood & affect appropriate.     The results of significant diagnostics from this hospitalization (including imaging, microbiology, ancillary and laboratory) are listed below for reference.     Microbiology: Recent Results (from the past 240 hours)  Blood culture (routine x 2)     Status: None (Preliminary result)   Collection Time: 06/27/24 12:25 AM   Specimen: BLOOD  Result Value Ref Range Status   Specimen Description   Final    BLOOD SITE NOT SPECIFIED Performed at [facility], [address], [place], [place] [address]    Special Requests   Final    BOTTLES DRAWN AEROBIC AND ANAEROBIC Blood Culture adequate volume Performed at [facility], [address], [place], [place] [address]    Culture   Final    NO GROWTH 4 DAYS Performed at [facility], [address], [place], [place] [address]    Report Status PENDING  Incomplete  Blood culture (routine x 2)     Status: None (Preliminary result)   Collection Time: 06/27/24 12:50 AM   Specimen: BLOOD  Result Value Ref Range Status   Specimen Description   Final    BLOOD SITE NOT SPECIFIED Performed at [facility], [address] [place], [place] [address]    Special Requests   Final    BOTTLES DRAWN AEROBIC AND ANAEROBIC Blood Culture adequate volume Performed at [facility], [address], [place], [place] [address]    Culture   Final    NO GROWTH 4 DAYS Performed at [facility], [address], [place], [place] [address]    Report Status PENDING  Incomplete  Resp panel by RT-PCR (RSV, Flu A&B, Covid) Anterior Nasal Swab     Status: None   Collection Time: 06/27/24  1:33 AM   Specimen: Anterior Nasal Swab  Result Value Ref Range Status   SARS Coronavirus 2 by RT PCR NEGATIVE NEGATIVE Final    Comment: (NOTE) SARS-CoV-2 target nucleic acids are NOT DETECTED.  The SARS-CoV-2 RNA is generally detectable in upper respiratory specimens during the acute phase of infection. The lowest concentration of SARS-CoV-2 viral copies this assay can detect is 138 copies/mL. A negative result does not preclude SARS-Cov-2 infection and should not be used as the sole basis for treatment or other patient management decisions. A negative result may occur with  improper specimen collection/handling, submission of specimen other than nasopharyngeal swab, presence of viral mutation(s) within the areas targeted by this assay, and inadequate number of viral copies(<138 copies/mL). A negative result must be combined with clinical observations, patient history, and epidemiological information. The expected result is Negative.  Fact Sheet for Patients:  BloggerCourse.com  Fact Sheet for Healthcare Providers:   SeriousBroker.it  This test is no t yet approved or cleared by the [place]  FDA and  has been authorized for detection and/or diagnosis of SARS-CoV-2 by FDA under an Emergency Use Authorization (EUA). This EUA will remain  in effect (meaning this test can be used) for the duration of the COVID-19 declaration under Section 564(b)(1) of the Act, 21 U.S.C.section 360bbb-3(b)(1), unless the authorization is terminated  or revoked sooner.       Influenza A by PCR NEGATIVE NEGATIVE Final   Influenza B by PCR NEGATIVE NEGATIVE Final    Comment: (NOTE) The Xpert Xpress SARS-CoV-2/FLU/RSV plus assay is intended as an aid in the diagnosis of influenza from Nasopharyngeal swab specimens and should not be used as a sole basis for treatment. Nasal washings and aspirates are unacceptable for Xpert Xpress SARS-CoV-2/FLU/RSV testing.  Fact Sheet for Patients: BloggerCourse.com  Fact Sheet for Healthcare Providers: SeriousBroker.it  This test is not yet approved or cleared by the [place]  FDA and has been authorized for detection and/or diagnosis of SARS-CoV-2 by FDA under an Emergency Use Authorization (EUA). This EUA will remain in effect (meaning this test can be used) for the duration of the COVID-19 declaration under Section 564(b)(1) of the Act, 21 U.S.C. section 360bbb-3(b)(1), unless the authorization is terminated or revoked.     Resp Syncytial Virus by PCR NEGATIVE NEGATIVE Final    Comment: (NOTE) Fact Sheet for Patients: BloggerCourse.com  Fact Sheet for Healthcare Providers: SeriousBroker.it  This test is not yet approved or cleared by the [place]  FDA and has been authorized for detection and/or diagnosis of SARS-CoV-2 by FDA under an Emergency Use Authorization (EUA). This EUA will remain in effect (meaning this test can be used) for  the duration of the COVID-19 declaration under Section 564(b)(1) of the Act, 21 U.S.C. section 360bbb-3(b)(1), unless the authorization is terminated or revoked.  Performed at [facility], [address] [place], [place] [address]   MRSA Next Gen by PCR, Nasal     Status: None   Collection Time: 06/27/24  2:34 PM   Specimen: Nasal Mucosa; Nasal Swab  Result Value Ref Range Status   MRSA by PCR Next Gen NOT DETECTED NOT DETECTED Final    Comment: (NOTE) The GeneXpert MRSA Assay (FDA approved for NASAL specimens only), is one component of a  comprehensive MRSA colonization surveillance program. It is not intended to diagnose MRSA infection nor to guide or monitor treatment for MRSA infections. Test performance is not FDA approved in patients less than 37 years old. Performed at [facility], [address] [place], [place] [address]      Labs: CBC: Recent Labs  Lab 06/27/24 0014 06/27/24 0033 06/27/24 0436 06/29/24 0616 06/30/24 1217  WBC 3.8*  --  4.5 2.8* 3.2*  NEUTROABS  --   --  3.8  --  1.9  HGB 10.7* 10.9* 10.2* 10.3* 11.3*  HCT 33.9* 32.0* 31.8* 33.7* 34.4*  MCV 95.5  --  96.4 98.0 94.5  PLT 137*  --  107* 118* 152    Basic Metabolic Panel: Recent Labs  Lab 06/27/24 0014 06/27/24 0033 06/27/24 0436 06/29/24 0616  NA 136 137 139 141  K 3.6 3.7 3.8 4.3  CL 100 102 104 109  CO2 24  --  22 23  GLUCOSE 128* 122* 106* 97  BUN 21 22 21 12   CREATININE 1.45* 1.60* 1.20* 0.98  CALCIUM  9.6  --  9.1 9.3  MG  --   --  1.9  --     Liver Function Tests: Recent Labs  Lab 06/27/24 0014 06/27/24 0436  AST 23 21  ALT 19 15  ALKPHOS 81 68  BILITOT 0.6 0.5  PROT 7.3 6.3*  ALBUMIN 4.2 3.7    CBG: Recent Labs  Lab 06/27/24 0014 06/27/24 1337  GLUCAP 118* 117*     Urinalysis    Component Value Date/Time   COLORURINE COLORLESS (A) 06/27/2024 0631   APPEARANCEUR CLEAR 06/27/2024 0631   APPEARANCEUR Clear  07/07/2014 0204   LABSPEC 1.009 06/27/2024 0631   LABSPEC 1.011 07/07/2014 0204   PHURINE 6.0 06/27/2024 0631   GLUCOSEU NEGATIVE 06/27/2024 0631   GLUCOSEU Negative 07/07/2014 0204   HGBUR MODERATE (A) 06/27/2024 0631   BILIRUBINUR NEGATIVE 06/27/2024 0631   BILIRUBINUR Negative 07/07/2014 0204   KETONESUR NEGATIVE 06/27/2024 0631   PROTEINUR NEGATIVE 06/27/2024 0631   UROBILINOGEN 0.2 08/17/2007 1555   NITRITE NEGATIVE 06/27/2024 0631   LEUKOCYTESUR NEGATIVE 06/27/2024 0631   LEUKOCYTESUR Negative 07/07/2014 0204      Time coordinating discharge: 35 minutes  SIGNED:  Trenda Mar, MD,  FACP, [facility], [facility], [facility]   [facility] Ad[facility] [place]     To contact the attending provider between 7A-7P or the covering provider during after hours 7P-7A, please log into the web site [email or web address] and access using universal [place] password for that web site. If you do not have the password, please call the hospital operator.

## 2024-07-02 LAB — CULTURE, BLOOD (ROUTINE X 2)
Culture: NO GROWTH
Culture: NO GROWTH
Special Requests: ADEQUATE
Special Requests: ADEQUATE

## 2024-07-04 ENCOUNTER — Other Ambulatory Visit (HOSPITAL_COMMUNITY): Payer: Self-pay

## 2024-07-04 LAB — EHRLICHIA ANTIBODY PANEL
E chaffeensis (HGE) Ab, IgG: NEGATIVE
E chaffeensis (HGE) Ab, IgM: NEGATIVE
E. Chaffeensis (HME) IgM Titer: NEGATIVE
E.Chaffeensis (HME) IgG: NEGATIVE

## 2024-07-06 ENCOUNTER — Inpatient Hospital Stay (HOSPITAL_BASED_OUTPATIENT_CLINIC_OR_DEPARTMENT_OTHER): Admitting: Hematology and Oncology

## 2024-07-06 ENCOUNTER — Inpatient Hospital Stay

## 2024-07-06 VITALS — BP 126/80 | HR 75 | Temp 97.8°F | Resp 18 | Ht 73.0 in | Wt 289.2 lb

## 2024-07-06 DIAGNOSIS — K529 Noninfective gastroenteritis and colitis, unspecified: Secondary | ICD-10-CM | POA: Diagnosis not present

## 2024-07-06 DIAGNOSIS — D701 Agranulocytosis secondary to cancer chemotherapy: Secondary | ICD-10-CM | POA: Diagnosis not present

## 2024-07-06 DIAGNOSIS — N179 Acute kidney failure, unspecified: Secondary | ICD-10-CM | POA: Diagnosis not present

## 2024-07-06 DIAGNOSIS — Z170 Estrogen receptor positive status [ER+]: Secondary | ICD-10-CM

## 2024-07-06 DIAGNOSIS — Z1721 Progesterone receptor positive status: Secondary | ICD-10-CM | POA: Diagnosis not present

## 2024-07-06 DIAGNOSIS — C50412 Malignant neoplasm of upper-outer quadrant of left female breast: Secondary | ICD-10-CM | POA: Diagnosis present

## 2024-07-06 DIAGNOSIS — C7800 Secondary malignant neoplasm of unspecified lung: Secondary | ICD-10-CM | POA: Diagnosis present

## 2024-07-06 DIAGNOSIS — C773 Secondary and unspecified malignant neoplasm of axilla and upper limb lymph nodes: Secondary | ICD-10-CM | POA: Diagnosis not present

## 2024-07-06 DIAGNOSIS — E669 Obesity, unspecified: Secondary | ICD-10-CM | POA: Diagnosis not present

## 2024-07-06 DIAGNOSIS — Z79811 Long term (current) use of aromatase inhibitors: Secondary | ICD-10-CM | POA: Diagnosis not present

## 2024-07-06 DIAGNOSIS — Z79899 Other long term (current) drug therapy: Secondary | ICD-10-CM | POA: Diagnosis not present

## 2024-07-06 DIAGNOSIS — Z1732 Human epidermal growth factor receptor 2 negative status: Secondary | ICD-10-CM | POA: Diagnosis not present

## 2024-07-06 LAB — CBC WITH DIFFERENTIAL (CANCER CENTER ONLY)
Abs Immature Granulocytes: 0.01 K/uL (ref 0.00–0.07)
Basophils Absolute: 0.1 K/uL (ref 0.0–0.1)
Basophils Relative: 1 %
Eosinophils Absolute: 0.1 K/uL (ref 0.0–0.5)
Eosinophils Relative: 2 %
HCT: 31.3 % — ABNORMAL LOW (ref 36.0–46.0)
Hemoglobin: 10.7 g/dL — ABNORMAL LOW (ref 12.0–15.0)
Immature Granulocytes: 0 %
Lymphocytes Relative: 32 %
Lymphs Abs: 1.3 K/uL (ref 0.7–4.0)
MCH: 31.2 pg (ref 26.0–34.0)
MCHC: 34.2 g/dL (ref 30.0–36.0)
MCV: 91.3 fL (ref 80.0–100.0)
Monocytes Absolute: 0.4 K/uL (ref 0.1–1.0)
Monocytes Relative: 11 %
Neutro Abs: 2.1 K/uL (ref 1.7–7.7)
Neutrophils Relative %: 54 %
Platelet Count: 177 K/uL (ref 150–400)
RBC: 3.43 MIL/uL — ABNORMAL LOW (ref 3.87–5.11)
RDW: 12 % (ref 11.5–15.5)
WBC Count: 3.9 K/uL — ABNORMAL LOW (ref 4.0–10.5)
nRBC: 0 % (ref 0.0–0.2)

## 2024-07-06 LAB — CMP (CANCER CENTER ONLY)
ALT: 20 U/L (ref 0–44)
AST: 19 U/L (ref 15–41)
Albumin: 4.1 g/dL (ref 3.5–5.0)
Alkaline Phosphatase: 67 U/L (ref 38–126)
Anion gap: 6 (ref 5–15)
BUN: 19 mg/dL (ref 8–23)
CO2: 28 mmol/L (ref 22–32)
Calcium: 9.3 mg/dL (ref 8.9–10.3)
Chloride: 107 mmol/L (ref 98–111)
Creatinine: 1.05 mg/dL — ABNORMAL HIGH (ref 0.44–1.00)
GFR, Estimated: 60 mL/min — ABNORMAL LOW (ref >60)
Glucose, Bld: 106 mg/dL — ABNORMAL HIGH (ref 70–99)
Potassium: 3.6 mmol/L (ref 3.5–5.1)
Sodium: 141 mmol/L (ref 135–145)
Total Bilirubin: 0.4 mg/dL (ref 0.0–1.2)
Total Protein: 7.2 g/dL (ref 6.5–8.1)

## 2024-07-06 NOTE — Assessment & Plan Note (Signed)
 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  AX L&D 03/11/2019: 3/14 lymph nodes positive with focal extranodal involvement [id number]   Treatment plan: 1.  Neoadjuvant chemotherapy with dose dense Adriamycin  and Cytoxan  every 2 weeks x4 followed by Taxol  weekly x 3 discontinued due to severe peripheral neuropathy completed 01/20/2019 2.  Followed by mastectomy on 02/22/2019 3.  Followed by radiation 04/20/2019-06/02/2019 4.  Followed by antiestrogen therapy. 06/02/2019 5. 07/15/22: Lung nodules on CT scan. 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 PET CT 07/31/23: 3 small Rt pleural based nodules hypermetabolic, hypermet Rt hilar LN ----------------------------------------------------------------------------------- Current treatment: Letrozole  with Verzenio  started 08/14/2023 Guardant360 09/16/2023: No ESR 1 mutation.  MSI high not detected  Hospitalization: 06/27/2024-07/01/2024 sudden onset of aphasia: No seizures no clear explanation.  Following insect bite on the right thigh admitted for sepsis

## 2024-07-06 NOTE — Progress Notes (Signed)
 Patient Care Team: Cristopher Suzen HERO, NP as PCP - General Ladona Heinz, MD as PCP - Cardiology (Cardiology) Vanderbilt Ned, MD as Consulting Physician (General Surgery) Odean Potts, MD as Consulting Physician (Hematology and Oncology) Dewey Rush, MD as Consulting Physician (Radiation Oncology)  DIAGNOSIS:  Encounter Diagnosis  Name Primary?   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, lymph node biopsy positive, intramammary lymph node biopsy negative, ER 100%, PR 100%, Ki-67 20%, HER-2 1+ by IHC, T2 and 1 stage 2A   10/27/2018 Cancer Staging   Staging form: Breast, AJCC 8th Edition - Clinical: Stage IIA (cT2, cN1(f), cM0, G1, ER+, PR+, HER2-) - Signed by Odean Potts, MD on 11/01/2018   11/11/2018 - 12/20/2018 Neo-Adjuvant Chemotherapy   Neoadjuvant chemotherapy with dose dense Adriamycin  and Cytoxan  every 2 weeks x4 followed by Taxol  weekly x12 (stopped after cycle 3 due to severe neuropathy)   02/01/2019 Breast MRI   Known malignancy in left breast measures 4.7 x 3.5 x 7.2cm, previously 5 x 3 x 10cm.    02/22/2019 Surgery   Left lumpectomy ([facility]): IDC with DCIS, 5.8cm, HER2 negative, ER 90%, PR 90%, Ki67 2%, clear margins,3/5 LN positive for malignancy.    03/02/2019 Cancer Staging   Staging form: Breast, AJCC 8th Edition - Pathologic stage from 03/02/2019: No Stage Recommended (ypT3, pN1a, cM0, G2, ER+, PR+, HER2-) - Signed by Odean Potts, MD on 03/02/2019   03/11/2019 Surgery   Left axillary lymph node dissection  (Cornett): metastatic carcinoma in 3/14 lymph nodes, focal extranodal involvement by tumor.   04/20/2019 - 06/02/2019 Radiation Therapy   Adjuvant radiation therapy   05/30/2019 -  Anti-estrogen oral therapy   Anastrozole  1 mg daily     CHIEF COMPLIANT: Follow-up after recent hospitalization  HISTORY OF PRESENT ILLNESS:   History of Present Illness [name] is a 63 year old female who presents for follow-up after a suspected spider bite and neurological symptoms.  She experienced two episodes of neurological symptoms with tingling and an inability to speak or move, except for her eyes. These episodes were frightening, but there have been no further neurological changes since discharge.  She has a history of breast cancer and was concerned about metastasis during her hospital stay. Her chemotherapy medication, Verzenio , was paused, and she is awaiting to resume it after her lab results showed improvement. Recent lab results include a white blood cell count of 3.9, hemoglobin of 10.7, and platelets of 177. Her anemia is stable, and electrolytes, sugar, and kidney function are within normal limits.  She is taking Cefalexin due to intolerance to doxycycline , which caused severe nausea, vomiting, and headaches.     ALLERGIES:  has no known allergies.  MEDICATIONS:  Current Outpatient Medications  Medication Sig Dispense Refill   abemaciclib  (VERZENIO ) 100 MG tablet Take 1 tablet (100 mg total) by mouth 2 (two) times daily. 56 tablet 5   atorvastatin  (LIPITOR) 10 MG tablet Take 1 tablet by mouth daily.     carvedilol  (COREG ) 6.25 MG tablet TAKE 1 TABLET BY MOUTH TWICE DAILY 180 tablet 1  diclofenac  Sodium (VOLTAREN ) 1 % GEL Apply 4 g topically 2 (two) times daily as needed (arthritis pain).     diphenoxylate -atropine  (LOMOTIL ) 2.5-0.025 MG tablet Take 1 tablet by mouth 4 (four) times daily as needed for diarrhea or loose stools. 30 tablet 1   letrozole  (FEMARA ) 2.5 MG  tablet Take 1 tablet (2.5 mg total) by mouth daily. 90 tablet 3   lisinopril -hydrochlorothiazide  (ZESTORETIC ) 10-12.5 MG tablet Take 1 tablet by mouth daily.     naproxen  (NAPROSYN ) 500 MG tablet Take 500 mg by mouth daily as needed for moderate pain (pain score 4-6).     pantoprazole  (PROTONIX ) 40 MG tablet TAKE 1 TABLET BY MOUTH EVERY DAY 30 tablet 5   No current facility-administered medications for this visit.   Facility-Administered Medications Ordered in Other Visits  Medication Dose Route Frequency Provider Last Rate Last Admin   gadopentetate dimeglumine  (MAGNEVIST ) injection 20 mL  20 mL Intravenous Once PRN Ines Onetha NOVAK, MD       gadopentetate dimeglumine  (MAGNEVIST ) injection 20 mL  20 mL Intravenous Once PRN Ines Onetha NOVAK, MD        PHYSICAL EXAMINATION: ECOG PERFORMANCE STATUS: 1 - Symptomatic but completely ambulatory  Vitals:   07/06/24 1524  BP: 126/80  Pulse: 75  Resp: 18  Temp: 97.8 F (36.6 C)  SpO2: 100%   Filed Weights   07/06/24 1524  Weight: 289 lb 3.2 oz (131.2 kg)    LABORATORY DATA:  I have reviewed the data as listed    Latest Ref Rng & Units 07/06/2024    2:45 PM 06/29/2024    6:16 AM 06/27/2024    4:36 AM  CMP  Glucose 70 - 99 mg/dL 893  97  893   BUN 8 - 23 mg/dL 19  12  21    Creatinine 0.44 - 1.00 mg/dL 8.94  9.01  8.79   Sodium 135 - 145 mmol/L 141  141  139   Potassium 3.5 - 5.1 mmol/L 3.6  4.3  3.8   Chloride 98 - 111 mmol/L 107  109  104   CO2 22 - 32 mmol/L 28  23  22    Calcium  8.9 - 10.3 mg/dL 9.3  9.3  9.1   Total Protein 6.5 - 8.1 g/dL 7.2   6.3   Total Bilirubin 0.0 - 1.2 mg/dL 0.4   0.5   Alkaline Phos 38 - 126 U/L 67   68   AST 15 - 41 U/L 19   21   ALT 0 - 44 U/L 20   15     Lab Results  Component Value Date   WBC 3.9 (L) 07/06/2024   HGB 10.7 (L) 07/06/2024   HCT 31.3 (L) 07/06/2024   MCV 91.3 07/06/2024   PLT 177 07/06/2024   NEUTROABS 2.1 07/06/2024    ASSESSMENT & PLAN:  Malignant neoplasm of  upper-outer quadrant of left breast in female, estrogen receptor positive (HCC) Left lumpectomy: 02/22/2019:([facility]): IDC with DCIS, 5.8cm, margins negative, lymphovascular invasion present HER2 negative, ER 90%, PR 90%, Ki67 2%, clear margins,3/5 LN positive for malignancy.  AX L&D 03/11/2019: 3/14 lymph nodes positive with focal extranodal involvement [id number]   Treatment plan: 1.  Neoadjuvant chemotherapy with dose dense Adriamycin  and Cytoxan  every 2 weeks x4 followed by Taxol  weekly x 3 discontinued due to severe peripheral neuropathy completed 01/20/2019 2.  Followed by mastectomy on 02/22/2019 3.  Followed by radiation 04/20/2019-06/02/2019 4.  Followed by antiestrogen therapy. 06/02/2019 5. 07/15/22: Lung nodules  on CT scan. 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 PET CT 07/31/23: 3 small Rt pleural based nodules hypermetabolic, hypermet Rt hilar LN ----------------------------------------------------------------------------------- Current treatment: Letrozole  with Verzenio  started 08/14/2023 Guardant360 09/16/2023: No ESR 1 mutation.  MSI high not detected  Hospitalization: 06/27/2024-07/01/2024 sudden onset of aphasia: No seizures no clear explanation.  Following insect bite on the right thigh admitted for sepsis  No further issues with headache, Nausea vomiting  Recommended to resume Verzinio and letrozole . She has a follow-up appointment in December. ------------------------------------- Assessment and Plan Assessment & Plan Breast cancer Breast cancer in the upper-outer quadrant of the left breast, estrogen receptor positive, on Anastrozole . Ready to resume Verzenio  after hospitalization and lab improvement. - Resume Verzenio  treatment. - Continue Anastrozole  1 mg oral daily.  Acute neurological symptoms Recent hospitalization for acute neurological symptoms. No definitive stroke on imaging. Symptoms possibly related to infection from insect bite. Avoided stroke protocol  due to bleeding risk and absence of stroke signs. - Monitor for any recurrence of neurological symptoms. - Continue cefalexin for infection management. - Avoid doxycycline  due to adverse effects.  Deep skin wound of lower leg Deep skin wound healing. - Continue to keep the wound covered with a Band-Aid.      No orders of the defined types were placed in this encounter.  The patient has a good understanding of the overall plan. she agrees with it. she will call with any problems that may develop before the next visit here. Total time spent: 30 mins including face to face time and time spent for planning, charting and co-ordination of care   Naomi MARLA Chad, MD 07/06/24

## 2024-07-07 ENCOUNTER — Other Ambulatory Visit: Payer: Self-pay

## 2024-07-07 LAB — CANCER ANTIGEN 27.29: CA 27.29: 44.1 U/mL — ABNORMAL HIGH (ref 0.0–38.6)

## 2024-07-07 NOTE — Progress Notes (Signed)
 Specialty Pharmacy Refill Coordination Note  Aicha Clingenpeel is a 63 y.o. female contacted today regarding refills of specialty medication(s) Abemaciclib  (VERZENIO )   Patient requested [name] at [facility] at [place] date: 07/13/24   Medication will be filled on 10.01.25.

## 2024-07-13 ENCOUNTER — Other Ambulatory Visit: Payer: Self-pay

## 2024-07-13 ENCOUNTER — Other Ambulatory Visit (HOSPITAL_COMMUNITY): Payer: Self-pay

## 2024-07-18 ENCOUNTER — Telehealth: Payer: Self-pay | Admitting: *Deleted

## 2024-07-18 NOTE — Telephone Encounter (Signed)
 Later entry - contacted by [facility] - Nurse CM w/[employer] contact # [phone] (confidential VM) She is available for supportive care needs

## 2024-08-05 ENCOUNTER — Other Ambulatory Visit: Payer: Self-pay

## 2024-08-05 ENCOUNTER — Other Ambulatory Visit (HOSPITAL_COMMUNITY): Payer: Self-pay

## 2024-08-05 NOTE — Progress Notes (Signed)
 Patient called back to state that she has 11 days left of medication. For insurance purposes, patient would like us  to fill medication on Monday 11/3, and she will pick up that evening. Patient is aware that it will be ready after lunch time on 11/3.

## 2024-08-05 NOTE — Progress Notes (Signed)
 Specialty Pharmacy Refill Coordination Note  [name] is a 63 y.o. female contacted today regarding refills of specialty medication(s) Abemaciclib  (VERZENIO )   Patient requested [name] at [facility] at [place] date: 08/08/24   Medication will be filled on 08/05/24.

## 2024-08-10 ENCOUNTER — Other Ambulatory Visit: Payer: Self-pay | Admitting: Hematology and Oncology

## 2024-08-15 ENCOUNTER — Telehealth: Payer: Self-pay

## 2024-08-15 ENCOUNTER — Other Ambulatory Visit (HOSPITAL_COMMUNITY): Payer: Self-pay

## 2024-08-15 ENCOUNTER — Other Ambulatory Visit: Payer: Self-pay

## 2024-08-15 NOTE — Progress Notes (Signed)
 Prior authorization is required for this patient's specialty medication. A voicemail was left for the patient to inform them of this delay.

## 2024-08-15 NOTE — Progress Notes (Signed)
 PA approved. Medication should be ready for pick up after 2:30pm today. Please let patient know if/when she calls back.

## 2024-08-15 NOTE — Progress Notes (Signed)
 Benefits Investigation Started  Reason: Prior Authorization Required  Routed to: [facility]

## 2024-08-15 NOTE — Telephone Encounter (Signed)
 Oral Oncology Patient Advocate Encounter  Prior Authorization RENEWAL for Verzenio  has been approved.    PA# [id number] Effective dates: 08/15/24 through 08/15/25     Charlott Hamilton,  CPhT-Adv  she/her/hers [place]  [facility] Specialty Pharmacy Services Pharmacy Technician Patient Advocate Specialist III [facility] Phone: [phone]  Fax: [phone] [email or web address]

## 2024-08-15 NOTE — Telephone Encounter (Signed)
 Oral Oncology Patient Advocate Encounter   Received notification that prior authorization for Verzenio  is due for renewal.   PA submitted on 08-15-24 Key [id number] Status is pending      Charlott Hamilton,  CPhT-Adv  she/her/hers [facility]  [facility] Specialty Pharmacy Services Pharmacy Technician Patient Advocate Specialist III [facility] Phone: [phone]  Fax: [phone] [email or web address]

## 2024-08-15 NOTE — Progress Notes (Signed)
 Prior Auth for Verzenio  Approved

## 2024-08-16 ENCOUNTER — Other Ambulatory Visit: Payer: Self-pay | Admitting: Hematology and Oncology

## 2024-08-16 DIAGNOSIS — K219 Gastro-esophageal reflux disease without esophagitis: Secondary | ICD-10-CM

## 2024-08-17 ENCOUNTER — Other Ambulatory Visit: Payer: Self-pay | Admitting: *Deleted

## 2024-08-17 DIAGNOSIS — R918 Other nonspecific abnormal finding of lung field: Secondary | ICD-10-CM

## 2024-08-17 DIAGNOSIS — C50412 Malignant neoplasm of upper-outer quadrant of left female breast: Secondary | ICD-10-CM

## 2024-08-17 NOTE — Progress Notes (Signed)
 Received call from pt with complaint of GI distress and increased anxiety with pushing CT scan out to 6 months.  RN reviewed with MD and verbal orders received and placed for pt to undergo CT scan in December prior to MD f/u.

## 2024-08-31 LAB — MM 3D SCREENING MAMMOGRAM BILATERAL BREAST

## 2024-09-02 ENCOUNTER — Other Ambulatory Visit: Payer: Self-pay

## 2024-09-02 ENCOUNTER — Encounter: Payer: Self-pay | Admitting: Hematology and Oncology

## 2024-09-02 ENCOUNTER — Other Ambulatory Visit: Payer: Self-pay | Admitting: Hematology and Oncology

## 2024-09-02 MED ORDER — ABEMACICLIB 100 MG PO TABS
100.0000 mg | ORAL_TABLET | Freq: Two times a day (BID) | ORAL | 5 refills | Status: DC
  Filled 2024-09-02: qty 56, 28d supply, fill #0
  Filled 2024-10-05: qty 56, 28d supply, fill #1

## 2024-09-02 NOTE — Progress Notes (Signed)
 Specialty Pharmacy Ongoing Clinical Assessment Note  [name] is a 63 y.o. female who is being followed by the specialty pharmacy service for RxSp Oncology   Patient's specialty medication(s) reviewed today: Abemaciclib  (VERZENIO )   Missed doses in the last 4 weeks: 0   Patient/Caregiver did not have any additional questions or concerns.   Therapeutic benefit summary: Patient is achieving benefit   Adverse events/side effects summary: Experienced adverse events/side effects (mild nausea, tolerable at this time)   Patient's therapy is appropriate to: Continue    Goals Addressed             This Visit's Progress    Maintain optimal adherence to therapy   On track    Patient is on track. Patient will maintain adherence.           Follow up: 3 months  [facility]

## 2024-09-02 NOTE — Progress Notes (Signed)
 Specialty Pharmacy Refill Coordination Note  [name] is a 63 y.o. female contacted today regarding refills of specialty medication(s) Abemaciclib  (VERZENIO )   Patient requested [name] at [facility] at [place] date: 09/14/24   Medication will be filled on: 09/13/24

## 2024-09-12 NOTE — Addendum Note (Signed)
 Encounter addended by: Laurita Dallas ORN on: 09/12/2024 2:47 PM  Actions taken: Imaging Exam ended

## 2024-09-13 ENCOUNTER — Other Ambulatory Visit: Payer: Self-pay

## 2024-09-15 ENCOUNTER — Inpatient Hospital Stay: Attending: Hematology and Oncology

## 2024-09-15 DIAGNOSIS — C7800 Secondary malignant neoplasm of unspecified lung: Secondary | ICD-10-CM | POA: Diagnosis present

## 2024-09-15 DIAGNOSIS — C773 Secondary and unspecified malignant neoplasm of axilla and upper limb lymph nodes: Secondary | ICD-10-CM | POA: Diagnosis present

## 2024-09-15 DIAGNOSIS — C50412 Malignant neoplasm of upper-outer quadrant of left female breast: Secondary | ICD-10-CM | POA: Insufficient documentation

## 2024-09-15 DIAGNOSIS — Z79811 Long term (current) use of aromatase inhibitors: Secondary | ICD-10-CM | POA: Diagnosis not present

## 2024-09-15 DIAGNOSIS — Z1721 Progesterone receptor positive status: Secondary | ICD-10-CM | POA: Insufficient documentation

## 2024-09-15 DIAGNOSIS — Z1732 Human epidermal growth factor receptor 2 negative status: Secondary | ICD-10-CM | POA: Diagnosis not present

## 2024-09-15 DIAGNOSIS — Z170 Estrogen receptor positive status [ER+]: Secondary | ICD-10-CM | POA: Diagnosis not present

## 2024-09-15 LAB — CBC WITH DIFFERENTIAL (CANCER CENTER ONLY)
Abs Immature Granulocytes: 0.01 K/uL (ref 0.00–0.07)
Basophils Absolute: 0 K/uL (ref 0.0–0.1)
Basophils Relative: 1 %
Eosinophils Absolute: 0 K/uL (ref 0.0–0.5)
Eosinophils Relative: 2 %
HCT: 32.2 % — ABNORMAL LOW (ref 36.0–46.0)
Hemoglobin: 10.6 g/dL — ABNORMAL LOW (ref 12.0–15.0)
Immature Granulocytes: 0 %
Lymphocytes Relative: 42 %
Lymphs Abs: 1.1 K/uL (ref 0.7–4.0)
MCH: 30.7 pg (ref 26.0–34.0)
MCHC: 32.9 g/dL (ref 30.0–36.0)
MCV: 93.3 fL (ref 80.0–100.0)
Monocytes Absolute: 0.2 K/uL (ref 0.1–1.0)
Monocytes Relative: 7 %
Neutro Abs: 1.3 K/uL — ABNORMAL LOW (ref 1.7–7.7)
Neutrophils Relative %: 48 %
Platelet Count: 141 K/uL — ABNORMAL LOW (ref 150–400)
RBC: 3.45 MIL/uL — ABNORMAL LOW (ref 3.87–5.11)
RDW: 12.6 % (ref 11.5–15.5)
WBC Count: 2.7 K/uL — ABNORMAL LOW (ref 4.0–10.5)
nRBC: 0 % (ref 0.0–0.2)

## 2024-09-15 LAB — CMP (CANCER CENTER ONLY)
ALT: 12 U/L (ref 0–44)
AST: 19 U/L (ref 15–41)
Albumin: 4 g/dL (ref 3.5–5.0)
Alkaline Phosphatase: 74 U/L (ref 38–126)
Anion gap: 6 (ref 5–15)
BUN: 17 mg/dL (ref 8–23)
CO2: 29 mmol/L (ref 22–32)
Calcium: 9.6 mg/dL (ref 8.9–10.3)
Chloride: 106 mmol/L (ref 98–111)
Creatinine: 1.22 mg/dL — ABNORMAL HIGH (ref 0.44–1.00)
GFR, Estimated: 50 mL/min — ABNORMAL LOW (ref >60)
Glucose, Bld: 113 mg/dL — ABNORMAL HIGH (ref 70–99)
Potassium: 4.7 mmol/L (ref 3.5–5.1)
Sodium: 141 mmol/L (ref 135–145)
Total Bilirubin: 0.3 mg/dL (ref 0.0–1.2)
Total Protein: 7 g/dL (ref 6.5–8.1)

## 2024-09-16 ENCOUNTER — Encounter (HOSPITAL_COMMUNITY): Payer: Self-pay

## 2024-09-16 ENCOUNTER — Ambulatory Visit (HOSPITAL_COMMUNITY)
Admission: RE | Admit: 2024-09-16 | Discharge: 2024-09-16 | Disposition: A | Source: Ambulatory Visit | Attending: Hematology and Oncology

## 2024-09-16 DIAGNOSIS — R918 Other nonspecific abnormal finding of lung field: Secondary | ICD-10-CM

## 2024-09-16 DIAGNOSIS — C50412 Malignant neoplasm of upper-outer quadrant of left female breast: Secondary | ICD-10-CM

## 2024-09-16 LAB — CANCER ANTIGEN 27.29: CA 27.29: 58.6 U/mL — ABNORMAL HIGH (ref 0.0–38.6)

## 2024-09-16 MED ORDER — IOHEXOL 300 MG/ML  SOLN
100.0000 mL | Freq: Once | INTRAMUSCULAR | Status: AC | PRN
  Administered 2024-09-16: 100 mL via INTRAVENOUS

## 2024-09-16 MED ORDER — SODIUM CHLORIDE (PF) 0.9 % IJ SOLN
INTRAMUSCULAR | Status: AC
  Filled 2024-09-16: qty 50

## 2024-09-16 NOTE — Addendum Note (Signed)
 Encounter addended by: Levora Maeola CROME on: 09/16/2024 7:33 AM  Actions taken: Imaging Exam ended

## 2024-09-19 ENCOUNTER — Telehealth: Payer: Self-pay

## 2024-09-19 NOTE — Telephone Encounter (Signed)
 Returned call and left voicemail for patient regarding increased CA 27.29 levels. Dr. Odean aware and has reviewed her labs. Patient informed that these levels can fluctuate, however we will corroborate the results based on CT scan which was done on 12/5.  Patient aware that scan is still not read, but will be ready and reviewed by her appointment with Dr. Odean on 12/10.  Patient knows that results and scan will be reviewed with her in detail at that time.  Provided callback number should patient have any additional questions or concerns.

## 2024-09-21 ENCOUNTER — Inpatient Hospital Stay (HOSPITAL_BASED_OUTPATIENT_CLINIC_OR_DEPARTMENT_OTHER): Admitting: Hematology and Oncology

## 2024-09-21 DIAGNOSIS — C773 Secondary and unspecified malignant neoplasm of axilla and upper limb lymph nodes: Secondary | ICD-10-CM | POA: Diagnosis not present

## 2024-09-21 DIAGNOSIS — Z1732 Human epidermal growth factor receptor 2 negative status: Secondary | ICD-10-CM

## 2024-09-21 DIAGNOSIS — Z170 Estrogen receptor positive status [ER+]: Secondary | ICD-10-CM

## 2024-09-21 DIAGNOSIS — Z923 Personal history of irradiation: Secondary | ICD-10-CM

## 2024-09-21 DIAGNOSIS — Z9012 Acquired absence of left breast and nipple: Secondary | ICD-10-CM

## 2024-09-21 DIAGNOSIS — C50412 Malignant neoplasm of upper-outer quadrant of left female breast: Secondary | ICD-10-CM | POA: Diagnosis not present

## 2024-09-21 DIAGNOSIS — Z1721 Progesterone receptor positive status: Secondary | ICD-10-CM | POA: Diagnosis not present

## 2024-09-21 DIAGNOSIS — R918 Other nonspecific abnormal finding of lung field: Secondary | ICD-10-CM

## 2024-09-21 NOTE — Progress Notes (Signed)
 HEMATOLOGY-ONCOLOGY TELEPHONE VISIT PROGRESS NOTE  I connected with our patient on 09/21/24 at  3:00 PM EST by telephone and verified that I am speaking with the correct person using two identifiers.  I discussed the limitations, risks, security and privacy concerns of performing an evaluation and management service by telephone and the availability of in person appointments.  I also discussed with the patient that there may be a patient responsible charge related to this service. The patient expressed understanding and agreed to proceed.   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  She describes significant anxiety about her recent CT and lab results but is reassured and relieved after hearing that the CT is stable. She reports no new symptoms.  She asks about her tumor marker, which was in the upper fifties, and is reassured that minor fluctuations are not concerning in the setting of stable imaging.  Her abemaciclib  and aromatase inhibitor were temporarily paused during a recent hospitalization for acute neurological symptoms and infection but are now resumed. She requests early morning appointments for future blood draws.  Jul 06, 2024: Follow-up after recent hospitalization for acute neurological symptoms and suspected sepsis; anti-estrogen therapy (Anastrozole  and Verzenio ) was paused during hospitalization and plan is to resume Verzenio  and continue Anastrozole . PET CT showed hypermetabolic pleural nodules and right hilar lymph nodes, consistent with metastatic disease. Anemia stable, deep skin wound managed with cefalexin, no recurrence of neurological symptoms.     Oncology History  Malignant neoplasm of upper-outer quadrant of left breast in female, estrogen receptor positive (HCC)  10/22/2018 Initial Diagnosis   Screening detected left breast architectural distortion: 4.5 cm, UOQ, by ultrasound measured 5 cm at 1 o'clock position multiple enlarged lymph nodes, biopsy revealed grade 1 IDC with DCIS with lymphovascular invasion, lymph node biopsy positive, intramammary lymph node biopsy negative, ER 100%, PR 100%, Ki-67 20%, HER-2 1+ by IHC, T2 and 1 stage 2A   10/27/2018 Cancer Staging   Staging form: Breast, AJCC 8th Edition - Clinical: Stage IIA (cT2, cN1(f), cM0, G1, ER+, PR+, HER2-) - Signed by Odean Potts, MD on 11/01/2018   11/11/2018 - 12/20/2018 Neo-Adjuvant Chemotherapy   Neoadjuvant chemotherapy with dose dense Adriamycin  and Cytoxan  every 2 weeks x4 followed by Taxol  weekly x12 (stopped after cycle 3 due to severe neuropathy)   02/01/2019 Breast MRI   Known malignancy in left breast measures 4.7 x 3.5 x 7.2cm, previously 5 x 3 x 10cm.    02/22/2019 Surgery   Left lumpectomy ([facility]): IDC with DCIS, 5.8cm, HER2 negative, ER 90%, PR 90%, Ki67 2%, clear margins,3/5 LN positive for malignancy.    03/02/2019 Cancer Staging   Staging form: Breast, AJCC 8th Edition - Pathologic stage from 03/02/2019: No Stage Recommended (ypT3, pN1a, cM0, G2, ER+, PR+, HER2-) - Signed by Odean Potts, MD on 03/02/2019   03/11/2019 Surgery   Left axillary lymph node dissection ([facility]): metastatic carcinoma in 3/14 lymph nodes, focal extranodal involvement by tumor.   04/20/2019 - 06/02/2019 Radiation Therapy   Adjuvant radiation therapy   05/30/2019 -  Anti-estrogen oral therapy   Anastrozole  1 mg daily     REVIEW OF SYSTEMS:   Constitutional: Denies fevers, chills or abnormal weight loss All other systems were reviewed with the patient  and are negative. Observations/Objective:     Assessment Plan:  Malignant neoplasm of upper-outer quadrant of left breast  in female, estrogen receptor positive (HCC) Left lumpectomy: 02/22/2019:([facility]): IDC with DCIS, 5.8cm, margins negative, lymphovascular invasion present HER2 negative, ER 90%, PR 90%, Ki67 2%, clear margins,3/5 LN positive for malignancy.  [facility] L&D 03/11/2019: 3/14 lymph nodes positive with focal extranodal involvement [id number]   Treatment plan: 1.  Neoadjuvant chemotherapy with dose dense Adriamycin  and Cytoxan  every 2 weeks x4 followed by Taxol  weekly x 3 discontinued due to severe peripheral neuropathy completed 01/20/2019 2.  Followed by mastectomy on 02/22/2019 3.  Followed by radiation 04/20/2019-06/02/2019 4.  Followed by antiestrogen therapy. 06/02/2019 5. 07/15/22: Lung nodules on CT scan. 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 PET CT 07/31/23: 3 small Rt pleural based nodules hypermetabolic, hypermet Rt hilar LN ----------------------------------------------------------------------------------- Current treatment: Letrozole  with Verzenio  started 08/14/2023 Guardant360 09/16/2023: No ESR 1 mutation.  MSI high not detected   Hospitalization: 06/27/2024-07/01/2024 sudden onset of aphasia: No seizures no clear explanation.  Following insect bite on the right thigh admitted for sepsis   No further issues with headache, Nausea vomiting   09/16/2024: CT CAP: Stable small lung nodules largest 7 mm unchanged  Continue with the current plan and recheck with labs and follow-up in 3 months  I discussed the assessment and treatment plan with the patient. The patient was provided an opportunity to ask questions and all were answered. The patient agreed with the plan and demonstrated an understanding of the instructions. The patient was advised to call back or seek an in-person evaluation if the symptoms worsen or if the condition fails to improve as anticipated.   I provided 20 minutes of non-face-to-face time during this encounter.  This includes time for charting and coordination of care    Naomi MARLA Chad, MD

## 2024-09-21 NOTE — Assessment & Plan Note (Signed)
 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  AX L&D 03/11/2019: 3/14 lymph nodes positive with focal extranodal involvement [id number]   Treatment plan: 1.  Neoadjuvant chemotherapy with dose dense Adriamycin  and Cytoxan  every 2 weeks x4 followed by Taxol  weekly x 3 discontinued due to severe peripheral neuropathy completed 01/20/2019 2.  Followed by mastectomy on 02/22/2019 3.  Followed by radiation 04/20/2019-06/02/2019 4.  Followed by antiestrogen therapy. 06/02/2019 5. 07/15/22: Lung nodules on CT scan. 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 PET CT 07/31/23: 3 small Rt pleural based nodules hypermetabolic, hypermet Rt hilar LN ----------------------------------------------------------------------------------- Current treatment: Letrozole  with Verzenio  started 08/14/2023 Guardant360 09/16/2023: No ESR 1 mutation.  MSI high not detected   Hospitalization: 06/27/2024-07/01/2024 sudden onset of aphasia: No seizures no clear explanation.  Following insect bite on the right thigh admitted for sepsis   No further issues with headache, Nausea vomiting   09/16/2024: CT CAP: Stable small lung nodules largest 7 mm unchanged  Continue with the current plan and recheck with labs and follow-up

## 2024-09-26 ENCOUNTER — Ambulatory Visit: Admitting: Hematology and Oncology

## 2024-09-26 ENCOUNTER — Other Ambulatory Visit

## 2024-10-05 ENCOUNTER — Other Ambulatory Visit (HOSPITAL_COMMUNITY): Payer: Self-pay

## 2024-10-07 ENCOUNTER — Other Ambulatory Visit: Payer: Self-pay

## 2024-10-07 NOTE — Progress Notes (Signed)
 Specialty Pharmacy Refill Coordination Note  [name] is a 63 y.o. female contacted today regarding refills of specialty medication(s) Abemaciclib  (VERZENIO )   Patient requested [name] at [facility] at [place] date: 10/14/24   Medication will be filled on: 10/14/24

## 2024-10-12 ENCOUNTER — Telehealth: Payer: Self-pay

## 2024-10-12 NOTE — Telephone Encounter (Signed)
 Received VM from pt today regarding some ppw she needs for disability. Before calling pt back, our FMLA team made me aware pt stopped by and her questions were addressed.

## 2024-10-14 ENCOUNTER — Other Ambulatory Visit (HOSPITAL_COMMUNITY): Payer: Self-pay

## 2024-10-14 ENCOUNTER — Other Ambulatory Visit: Payer: Self-pay

## 2024-10-15 ENCOUNTER — Other Ambulatory Visit: Payer: Self-pay | Admitting: Podiatry

## 2024-10-17 ENCOUNTER — Other Ambulatory Visit: Payer: Self-pay | Admitting: Pharmacist

## 2024-10-17 ENCOUNTER — Other Ambulatory Visit (HOSPITAL_COMMUNITY): Payer: Self-pay

## 2024-10-17 ENCOUNTER — Telehealth: Payer: Self-pay

## 2024-10-17 ENCOUNTER — Encounter (HOSPITAL_COMMUNITY): Payer: Self-pay

## 2024-10-17 ENCOUNTER — Other Ambulatory Visit: Payer: Self-pay

## 2024-10-17 MED ORDER — ABEMACICLIB 100 MG PO TABS: 100.0000 mg | ORAL_TABLET | Freq: Two times a day (BID) | ORAL | 5 refills | Status: AC

## 2024-10-17 NOTE — Telephone Encounter (Signed)
 Oral Oncology Patient Advocate Encounter   Patient needs copay assistance for Verzenio  though commercial insurance plan [facility]  Plan.  Copay $6475.00  Through further investigation  by calling the plan,  I  have discovered her plan prefers [facility] for Patient to receive medication at no cost to her.    I have sent med and demo info to [facility] as well as requested a prescription to be sent there by the nursing staff.    Patient  has been notified via phone and also given the phone #[phone] to [facility] to get  set up for delivery with them.   I will continue to follow up on the delivery of this medication      [name],  CPhT-Adv  she/her/hers [place]  [facility] Pharmacy Technician Patient Advocate Specialist III [facility] Phone: [phone]  Fax: [phone] [name]

## 2024-10-18 ENCOUNTER — Other Ambulatory Visit: Payer: Self-pay | Admitting: Pharmacist

## 2024-10-18 ENCOUNTER — Other Ambulatory Visit: Payer: Self-pay

## 2024-10-18 NOTE — Progress Notes (Signed)
 Clinical pharmacy was asked to contact [facility] pharmacy regarding prescription clarification. Abemaciclib  sent yesterday per insurance preference.   Spoke to [employer]. At [phone] at [facility] today. She stated nothing wrong with prescription but they will reach out to patient regarding copay options since high copay. Relayed this to Dr. Gara nurse Earnie and our patient advocate Cassie.  Her next appt currently is with Dr. Gudena on 12/21/24 with labs prior.  Gaelen Brager A. Lucila, PharmD, BCOP, CPP 10/18/2024 12:46 PM

## 2024-10-19 ENCOUNTER — Other Ambulatory Visit: Payer: Self-pay | Admitting: Pharmacist

## 2024-10-19 ENCOUNTER — Other Ambulatory Visit (HOSPITAL_COMMUNITY): Payer: Self-pay

## 2024-10-19 NOTE — Progress Notes (Signed)
 Per Dr. 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 Confirmed with [facility] that prescription we sent earlier in the week is active. She will pick up donated drug tomorrow morning.  Dr. Gudena sees her again in March and patient will work with insurance to try to get drug for February understanding high deductible.  Jackelyne Sayer A. Lucila, PharmD, BCOP, CPP 10/19/2024 4:06 PM

## 2024-10-20 ENCOUNTER — Other Ambulatory Visit: Payer: Self-pay

## 2024-10-20 NOTE — Progress Notes (Signed)
 Patient now filling Rx through [facility] for copay savings

## 2024-10-20 NOTE — Telephone Encounter (Signed)
 Oral Oncology Patient Advocate Encounter  After several calls to the patient's plan and [facility] it has been confirmed that patient must pay her $3000.00 deductible before she can receive Verzenio   at $0 copay with  copay assistance this year. Patient will receive 28 days of donated drug today from [facility]. She is aware her deductible must be paid before she can continue to receive  medication refills. If she can not do this she is aware she must consult Dr.Gudena  for alternatives.     Charlott Hamilton,  CPhT-Adv  she/her/hers [facility]  [facility] Pharmacy Technician Patient Advocate Specialist III [facility] Phone: [phone]  Fax: [phone] [email or web address]

## 2024-11-08 ENCOUNTER — Telehealth: Payer: Self-pay | Admitting: Cardiology

## 2024-11-08 DIAGNOSIS — E7800 Pure hypercholesterolemia, unspecified: Secondary | ICD-10-CM

## 2024-11-08 NOTE — Addendum Note (Signed)
 Addended by: LADONA MILAN on: 11/08/2024 07:04 PM   Modules accepted: Orders

## 2024-11-08 NOTE — Telephone Encounter (Signed)
ICD-10-CM   1. Hypercholesteremia  E78.00 Lipid Panel With LDL/HDL Ratio     Orders Placed This Encounter  Procedures   Lipid Panel With LDL/HDL Ratio

## 2024-11-08 NOTE — Telephone Encounter (Signed)
 Patient is requesting orders for lab work prior to 4/06 appointment with Dr. Ladona.

## 2024-12-21 ENCOUNTER — Inpatient Hospital Stay: Attending: Hematology and Oncology

## 2024-12-21 ENCOUNTER — Inpatient Hospital Stay: Admitting: Hematology and Oncology

## 2025-01-16 ENCOUNTER — Ambulatory Visit: Admitting: Cardiology
